# Patient Record
Sex: Female | Born: 1935 | Race: White | Hispanic: No | Marital: Married | Smoking: Never smoker
Health system: Southern US, Community
[De-identification: ages and names within clinical notes are randomized; demographics above are authoritative.]

## PROBLEM LIST (undated history)

## (undated) DIAGNOSIS — E890 Postprocedural hypothyroidism: Secondary | ICD-10-CM

## (undated) DIAGNOSIS — I2510 Atherosclerotic heart disease of native coronary artery without angina pectoris: Secondary | ICD-10-CM

## (undated) DIAGNOSIS — M1990 Unspecified osteoarthritis, unspecified site: Secondary | ICD-10-CM

## (undated) DIAGNOSIS — K648 Other hemorrhoids: Secondary | ICD-10-CM

## (undated) DIAGNOSIS — Z974 Presence of external hearing-aid: Secondary | ICD-10-CM

## (undated) DIAGNOSIS — E785 Hyperlipidemia, unspecified: Secondary | ICD-10-CM

## (undated) DIAGNOSIS — I10 Essential (primary) hypertension: Secondary | ICD-10-CM

## (undated) DIAGNOSIS — K5730 Diverticulosis of large intestine without perforation or abscess without bleeding: Secondary | ICD-10-CM

## (undated) DIAGNOSIS — Z955 Presence of coronary angioplasty implant and graft: Secondary | ICD-10-CM

## (undated) DIAGNOSIS — E119 Type 2 diabetes mellitus without complications: Secondary | ICD-10-CM

## (undated) HISTORY — PX: CORONARY ANGIOPLASTY WITH STENT PLACEMENT: SHX49

## (undated) HISTORY — PX: CARDIOVASCULAR STRESS TEST: SHX262

## (undated) HISTORY — DX: Atherosclerotic heart disease of native coronary artery without angina pectoris: I25.10

## (undated) HISTORY — DX: Unspecified osteoarthritis, unspecified site: M19.90

---

## 1997-10-16 ENCOUNTER — Ambulatory Visit (HOSPITAL_COMMUNITY): Admission: RE | Admit: 1997-10-16 | Discharge: 1997-10-16 | Payer: Self-pay | Admitting: Obstetrics & Gynecology

## 1998-10-22 ENCOUNTER — Ambulatory Visit (HOSPITAL_COMMUNITY): Admission: RE | Admit: 1998-10-22 | Discharge: 1998-10-22 | Payer: Self-pay | Admitting: Obstetrics & Gynecology

## 1998-10-22 ENCOUNTER — Encounter: Payer: Self-pay | Admitting: Obstetrics & Gynecology

## 1999-10-28 ENCOUNTER — Ambulatory Visit (HOSPITAL_COMMUNITY): Admission: RE | Admit: 1999-10-28 | Discharge: 1999-10-28 | Payer: Self-pay | Admitting: Obstetrics & Gynecology

## 1999-10-28 ENCOUNTER — Encounter: Payer: Self-pay | Admitting: Obstetrics & Gynecology

## 1999-12-08 ENCOUNTER — Encounter: Payer: Self-pay | Admitting: Family Medicine

## 1999-12-08 ENCOUNTER — Encounter: Admission: RE | Admit: 1999-12-08 | Discharge: 1999-12-08 | Payer: Self-pay | Admitting: Family Medicine

## 2000-05-20 ENCOUNTER — Other Ambulatory Visit: Admission: RE | Admit: 2000-05-20 | Discharge: 2000-05-20 | Payer: Self-pay | Admitting: Obstetrics & Gynecology

## 2000-11-09 ENCOUNTER — Ambulatory Visit (HOSPITAL_COMMUNITY): Admission: RE | Admit: 2000-11-09 | Discharge: 2000-11-09 | Payer: Self-pay | Admitting: Obstetrics & Gynecology

## 2000-11-09 ENCOUNTER — Encounter: Payer: Self-pay | Admitting: Obstetrics & Gynecology

## 2001-11-15 ENCOUNTER — Ambulatory Visit (HOSPITAL_COMMUNITY): Admission: RE | Admit: 2001-11-15 | Discharge: 2001-11-15 | Payer: Self-pay | Admitting: Obstetrics & Gynecology

## 2001-11-15 ENCOUNTER — Encounter: Payer: Self-pay | Admitting: Obstetrics & Gynecology

## 2002-06-30 ENCOUNTER — Other Ambulatory Visit: Admission: RE | Admit: 2002-06-30 | Discharge: 2002-06-30 | Payer: Self-pay | Admitting: Obstetrics & Gynecology

## 2002-11-21 ENCOUNTER — Ambulatory Visit (HOSPITAL_COMMUNITY): Admission: RE | Admit: 2002-11-21 | Discharge: 2002-11-21 | Payer: Self-pay | Admitting: Obstetrics & Gynecology

## 2002-11-21 ENCOUNTER — Encounter: Payer: Self-pay | Admitting: Obstetrics & Gynecology

## 2003-04-27 ENCOUNTER — Ambulatory Visit (HOSPITAL_COMMUNITY): Admission: RE | Admit: 2003-04-27 | Discharge: 2003-04-27 | Payer: Self-pay | Admitting: Gastroenterology

## 2003-04-27 HISTORY — PX: COLONOSCOPY: SHX174

## 2003-08-09 ENCOUNTER — Other Ambulatory Visit: Admission: RE | Admit: 2003-08-09 | Discharge: 2003-08-09 | Payer: Self-pay | Admitting: Obstetrics & Gynecology

## 2003-11-27 ENCOUNTER — Ambulatory Visit (HOSPITAL_COMMUNITY): Admission: RE | Admit: 2003-11-27 | Discharge: 2003-11-27 | Payer: Self-pay | Admitting: Obstetrics & Gynecology

## 2003-12-03 ENCOUNTER — Encounter: Admission: RE | Admit: 2003-12-03 | Discharge: 2003-12-03 | Payer: Self-pay | Admitting: Obstetrics & Gynecology

## 2003-12-04 ENCOUNTER — Encounter: Admission: RE | Admit: 2003-12-04 | Discharge: 2003-12-04 | Payer: Self-pay | Admitting: Obstetrics & Gynecology

## 2004-12-09 ENCOUNTER — Ambulatory Visit (HOSPITAL_COMMUNITY): Admission: RE | Admit: 2004-12-09 | Discharge: 2004-12-09 | Payer: Self-pay | Admitting: Obstetrics & Gynecology

## 2005-07-27 HISTORY — PX: CATARACT EXTRACTION W/ INTRAOCULAR LENS  IMPLANT, BILATERAL: SHX1307

## 2005-09-09 ENCOUNTER — Other Ambulatory Visit: Admission: RE | Admit: 2005-09-09 | Discharge: 2005-09-09 | Payer: Self-pay | Admitting: Obstetrics & Gynecology

## 2005-11-09 ENCOUNTER — Encounter: Admission: RE | Admit: 2005-11-09 | Discharge: 2005-11-09 | Payer: Self-pay | Admitting: Family Medicine

## 2005-12-10 ENCOUNTER — Ambulatory Visit (HOSPITAL_COMMUNITY): Admission: RE | Admit: 2005-12-10 | Discharge: 2005-12-10 | Payer: Self-pay | Admitting: Obstetrics & Gynecology

## 2006-01-06 ENCOUNTER — Encounter: Admission: RE | Admit: 2006-01-06 | Discharge: 2006-01-06 | Payer: Self-pay | Admitting: Obstetrics & Gynecology

## 2007-01-18 ENCOUNTER — Ambulatory Visit (HOSPITAL_COMMUNITY): Admission: RE | Admit: 2007-01-18 | Discharge: 2007-01-18 | Payer: Self-pay | Admitting: Obstetrics & Gynecology

## 2007-01-18 ENCOUNTER — Encounter: Admission: RE | Admit: 2007-01-18 | Discharge: 2007-01-18 | Payer: Self-pay | Admitting: Family Medicine

## 2007-01-24 ENCOUNTER — Encounter (INDEPENDENT_AMBULATORY_CARE_PROVIDER_SITE_OTHER): Payer: Self-pay | Admitting: Interventional Radiology

## 2007-01-24 ENCOUNTER — Ambulatory Visit (HOSPITAL_COMMUNITY): Admission: RE | Admit: 2007-01-24 | Discharge: 2007-01-24 | Payer: Self-pay | Admitting: Family Medicine

## 2007-05-02 ENCOUNTER — Encounter (INDEPENDENT_AMBULATORY_CARE_PROVIDER_SITE_OTHER): Payer: Self-pay | Admitting: Surgery

## 2007-05-02 ENCOUNTER — Inpatient Hospital Stay (HOSPITAL_COMMUNITY): Admission: RE | Admit: 2007-05-02 | Discharge: 2007-05-03 | Payer: Self-pay | Admitting: Surgery

## 2007-05-02 HISTORY — PX: TOTAL THYROIDECTOMY: SHX2547

## 2008-01-19 ENCOUNTER — Ambulatory Visit (HOSPITAL_COMMUNITY): Admission: RE | Admit: 2008-01-19 | Discharge: 2008-01-19 | Payer: Self-pay | Admitting: Obstetrics & Gynecology

## 2009-01-23 ENCOUNTER — Ambulatory Visit (HOSPITAL_COMMUNITY): Admission: RE | Admit: 2009-01-23 | Discharge: 2009-01-23 | Payer: Self-pay | Admitting: Obstetrics & Gynecology

## 2010-01-29 ENCOUNTER — Ambulatory Visit (HOSPITAL_COMMUNITY): Admission: RE | Admit: 2010-01-29 | Discharge: 2010-01-29 | Payer: Self-pay | Admitting: Obstetrics & Gynecology

## 2010-08-17 ENCOUNTER — Encounter: Payer: Self-pay | Admitting: Family Medicine

## 2010-12-09 NOTE — Op Note (Signed)
NAMEXOE, HOE NO.:  [id number]   MEDICAL RECORD NO.:  [id number]          PATIENT TYPE:  OIB   LOCATION:  0098                         FACILITY:  [facility]   PHYSICIAN:  Velora Heckler, MD      DATE OF BIRTH:  1932-08-02   DATE OF PROCEDURE:  05/02/2007  DATE OF DISCHARGE:                               OPERATIVE REPORT   PREOPERATIVE DIAGNOSIS:  Multinodular thyroid goiter, dominant left  cystic mass with internal calcification.   POSTOPERATIVE DIAGNOSIS:  Multinodular thyroid goiter, dominant left  cystic mass with internal calcification.   PROCEDURE:  Total thyroidectomy.   SURGEON:  Velora Heckler, M.D., FACS   ASSISTANT:  Amber L. Freida Busman, M.D.   ANESTHESIA:  General.   ESTIMATED BLOOD LOSS:  Minimal.   PREPARATION:  Betadine.   COMPLICATIONS:  None.   INDICATIONS:  The patient is 77 year old white female from [place],  [place].  She was found in June of 2008 to have a mass in the  left side of the thyroid.  She noted progressive enlargement as well as  dysphagia with a frequent choking sensation.  The patient was seen and  evaluated by Dr. Dorisann Frames.  Ultrasound demonstrated an enlarged  thyroid gland with multiple nodules in the right lobe and isthmus and a  dominant complex cystic mass in the left thyroid lobe with central  calcification.  Fine-needle aspiration was performed.  The patient now  comes to surgery for thyroidectomy.   DESCRIPTION OF PROCEDURE:  The procedure is done in OR #6 at the [facility].  The patient is brought to the operating room  and placed in the supine position on the operating room table.  Following administration of general anesthesia, the patient is  positioned and then prepped and draped in the usual strict aseptic  fashion.   After ascertaining that an adequate level of anesthesia been obtained, a  Kocher incision is made with a #15 blade.  Dissection is carried through  subcutaneous tissues and platysma.  Hemostasis is obtained with the  electrocautery.  Skin flaps are elevated cephalad and caudad from the  thyroid notch to the sternal notch.  A Mahorner self-retaining retractor  is placed for exposure.  The strap muscles are incised in the midline.  The left thyroid lobe is exposed.  The strap muscles are reflected  laterally.  Using a Pension scheme manager and gentle blunt dissection, the  left lobe was mobilized.  The inferior venous tributaries are ligated  with 2-0 silk ties and divided with the Harmonic scalpel.  The superior  pole vessels are dissected out, ligated in continuity with 2-0 silk ties  and divided with the Harmonic scalpel.  Branches of the inferior thyroid  artery are divided between small clips with the Harmonic scalpel.  The  recurrent nerve is identified and preserved.  Branches of the inferior  thyroid artery are divided with the Harmonic scalpel.  The ligament of  Allyson Sabal is transected with the electrocautery, and the gland is rolled  anteriorly up and onto the anterior trachea.  The  left lobe is markedly  enlarged.  It does contain a large cystic mass which is not opened in  the operative field.  A dry pack is placed in the left neck.  The  isthmus is mobilized across the midline with the electrocautery.  A  moderate size pyramidal lobe is dissected out with the Harmonic scalpel  and included with the specimen.   Next, we turned our attention to the right lobe.  The right lobe was  much smaller but was multinodular.  The strap muscles are reflected  laterally.  The venous tributaries are divided between medium Ligaclips  with the Harmonic scalpel.  The superior pole vessels are divided  between medium Ligaclips with the Harmonic scalpel.  The gland is rolled  anteriorly.  The branches of the inferior thyroid artery are divided  between small Ligaclips with the Harmonic scalpel.  The inferior venous  tributaries are divided between  medium Ligaclips with the Harmonic  scalpel.  The gland is rolled anteriorly, and the ligament of Allyson Sabal is  transected with the Harmonic scalpel.  The recurrent nerve is identified  and preserved.  The parathyroid tissue is identified and preserved.  The  gland is excised off the anterior trachea with the Harmonic scalpel.  Suture is used to mark the left superior pole.  The entire gland is  submitted to pathology for review.  The neck is irrigated with warm  saline and hemostasis obtained with the electrocautery.  Surgicel is  placed in the operative field bilaterally.  The strap muscles are  reapproximated in the midline with interrupted 3-0 Vicryl sutures.  The  platysma is closed with interrupted 3-0 Vicryl sutures.  The skin is  closed with a running 4-0 Monocryl subcuticular suture.  The wound is  washed and dried, and Benzoin and Steri-Strips are applied.  Sterile  dressings are applied.   The patient is awakened from anesthesia and brought to the recovery room  in stable condition.  The patient tolerated the procedure well.      Velora Heckler, MD  Electronically Signed     TMG/MEDQ  D:  05/02/2007  T:  05/02/2007  Job:  [id number]   cc:   Duncan Dull, M.D.  Fax: [phone]   Dorisann Frames, M.D.  Fax: [phone]

## 2010-12-12 NOTE — Discharge Summary (Signed)
NAMEBRANDIN, DILDAY NO.:  [id number]   MEDICAL RECORD NO.:  [id number]          PATIENT TYPE:  INP   LOCATION:  1340                         FACILITY:  [facility]   PHYSICIAN:  Velora Heckler, MD      DATE OF BIRTH:  Aug 30, 1932   DATE OF ADMISSION:  05/02/2007  DATE OF DISCHARGE:  05/03/2007                               DISCHARGE SUMMARY   REASON FOR ADMISSION:  Multinodular thyroid goiter.   HISTORY OF PRESENT ILLNESS:  Patient is a 77 year old white female from  [place], [place].  Patient had developed progressive  enlargement of a multinodular goiter with compressive symptoms.  Patient  was evaluated by Dr. Dorisann Frames.  Ultrasound showed an enlarged  thyroid gland with multiple nodules.  There was a complex cystic mass in  the left thyroid lobe with central calcifications.  Cyst was aspirated  bur recurred within day.  Patient now comes to surgery for resection.   HOSPITAL COURSE:  Patient was admitted on May 02, 2007, and taken  directly to the operating room where she underwent total thyroidectomy.  Postoperatively, the patient did well.  She was observed overnight.  Calcium levels stabilized at 8.6.  She was prepared for discharge home  on the first postoperative day.   DISCHARGE PLAN:  Patient is discharged home on May 03, 2007, in good  condition, tolerating a regular, diet and ambulating independently.   DISCHARGE MEDICATIONS:  1. Vicodin for pain.  2. Calcium carbonate.  3. Synthroid 100 mcg daily.   Patient will be seen back at my office at [facility] in  two to three weeks for wound check.   FINAL DIAGNOSIS:  Multinodular thyroid goiter with compressive symptoms  and calcification of complex cystic mass.   CONDITION ON DISCHARGE:  Good.      Velora Heckler, MD  Electronically Signed     TMG/MEDQ  D:  05/24/2007  T:  05/24/2007  Job:  [id number]   cc:   Dorisann Frames, M.D.  Fax: [phone]   Duncan Dull, M.D.  Fax: [phone]   [facility]

## 2010-12-12 NOTE — Op Note (Signed)
   NAME:  Alicia Medina, Alicia Medina                          ACCOUNT NO.:  [id number]   MEDICAL RECORD NO.:  [id number]                   PATIENT TYPE:  AMB   LOCATION:  ENDO                                 FACILITY:  [facility]   PHYSICIAN:  Petra Kuba, M.D.                 DATE OF BIRTH:  [birth year]   DATE OF PROCEDURE:  04/27/2003  DATE OF DISCHARGE:                                 OPERATIVE REPORT   PROCEDURE:  Colonoscopy.   INDICATIONS FOR PROCEDURE:  Patient with history of colon polyps due for  colonic screening.  Consent was signed after risks, benefits, methods, and  options were thoroughly discussed in the office.   PREMEDICATION:  Demerol 70, Versed 7.   DESCRIPTION OF PROCEDURE:  Rectal inspection was pertinent for external  hemorrhoids, small.  Digital examination was negative.  The video pediatric  adjustable colonoscope was inserted and with some difficulty due to a  tortuous sigmoid, went through that area with rolling her on her back and  abdominal pressure, easily able to advance to the cecum.  Other than some  early left-sided diverticuli, no abnormalities were seen.  The cecum was  identified by the appendiceal orifice and the ileocecal valve.  In fact, the  scope was inserted a short ways into the terminal ileum which was normal.  The scope was slowly withdrawn.  The prep was adequate. There was some  liquid stool that required washing and suctioning and again on slow  withdrawal through the colon, no abnormalities were seen other than the  early left-sided diverticuli.  Anal rectal pull through and retroflexion  confirmed some small hemorrhoids.  The scope was reinserted a short ways up  the left side of the colon, air was suctioned, and the scope removed.  The  patient tolerated the procedure well and there was no obvious immediate  complications.   ENDOSCOPIC ASSESSMENT:  1. Internal and external hemorrhoid.  2. Early left-sided diverticuli.  3. Tortuous sigmoid.  4. Otherwise within normal limits to the terminal ileum.   PLAN:  Happy to see back p.r.n.  Return care to Dr. Kevan Ny for the customary  health care maintenance to include yearly rectals and guaiacs and otherwise  probably repeat screening in five years.                                               Petra Kuba, M.D.    MEM/MEDQ  D:  04/27/2003  T:  04/28/2003  Job:  [id number]

## 2010-12-15 ENCOUNTER — Other Ambulatory Visit: Payer: Self-pay | Admitting: Family Medicine

## 2010-12-15 DIAGNOSIS — N6321 Unspecified lump in the left breast, upper outer quadrant: Secondary | ICD-10-CM

## 2010-12-15 DIAGNOSIS — Z1231 Encounter for screening mammogram for malignant neoplasm of breast: Secondary | ICD-10-CM

## 2010-12-19 ENCOUNTER — Ambulatory Visit
Admission: RE | Admit: 2010-12-19 | Discharge: 2010-12-19 | Disposition: A | Discharge status: Home / Self Care | Payer: Medicare Other | Source: Ambulatory Visit | Attending: Family Medicine | Admitting: Family Medicine

## 2010-12-19 DIAGNOSIS — N6321 Unspecified lump in the left breast, upper outer quadrant: Secondary | ICD-10-CM

## 2011-02-05 ENCOUNTER — Ambulatory Visit
Admission: RE | Admit: 2011-02-05 | Discharge: 2011-02-05 | Disposition: A | Discharge status: Home / Self Care | Payer: Medicare Other | Source: Ambulatory Visit | Attending: Family Medicine | Admitting: Family Medicine

## 2011-02-05 DIAGNOSIS — Z1231 Encounter for screening mammogram for malignant neoplasm of breast: Secondary | ICD-10-CM

## 2011-05-07 LAB — URINALYSIS, ROUTINE W REFLEX MICROSCOPIC
Hgb urine dipstick: NEGATIVE
Ketones, ur: NEGATIVE
Nitrite: NEGATIVE
Urobilinogen, UA: 0.2

## 2011-05-07 LAB — DIFFERENTIAL
Basophils Absolute: 0
Eosinophils Absolute: 0.2
Eosinophils Relative: 3
Lymphocytes Relative: 20
Monocytes Absolute: 0.7

## 2011-05-07 LAB — CBC
MCHC: 34
MCV: 85.2

## 2011-05-07 LAB — BASIC METABOLIC PANEL
BUN: 7
CO2: 30
Chloride: 105
GFR calc Af Amer: >60
Sodium: 143

## 2011-05-07 LAB — PROTIME-INR: INR: 1

## 2011-05-07 LAB — CALCIUM: Calcium: 8.6

## 2011-08-28 ENCOUNTER — Other Ambulatory Visit: Payer: Self-pay

## 2011-08-28 ENCOUNTER — Encounter (HOSPITAL_COMMUNITY): Payer: Self-pay | Admitting: Internal Medicine

## 2011-08-28 ENCOUNTER — Encounter (HOSPITAL_COMMUNITY)
Admission: EM | Disposition: A | Discharge status: Home / Self Care | Payer: Self-pay | Source: Ambulatory Visit | Attending: Cardiology

## 2011-08-28 ENCOUNTER — Inpatient Hospital Stay (HOSPITAL_COMMUNITY): Payer: Medicare Other

## 2011-08-28 ENCOUNTER — Inpatient Hospital Stay (HOSPITAL_COMMUNITY)
Admission: EM | Admit: 2011-08-28 | Discharge: 2011-09-05 | DRG: 237 | Disposition: A | Discharge status: Home / Self Care | Payer: Medicare Other | Source: Ambulatory Visit | Attending: Cardiology | Admitting: Cardiology

## 2011-08-28 ENCOUNTER — Ambulatory Visit (HOSPITAL_COMMUNITY): Payer: Medicare Other

## 2011-08-28 ENCOUNTER — Encounter (HOSPITAL_COMMUNITY): Payer: Self-pay

## 2011-08-28 DIAGNOSIS — I10 Essential (primary) hypertension: Secondary | ICD-10-CM | POA: Diagnosis present

## 2011-08-28 DIAGNOSIS — Y840 Cardiac catheterization as the cause of abnormal reaction of the patient, or of later complication, without mention of misadventure at the time of the procedure: Secondary | ICD-10-CM | POA: Diagnosis present

## 2011-08-28 DIAGNOSIS — I469 Cardiac arrest, cause unspecified: Secondary | ICD-10-CM

## 2011-08-28 DIAGNOSIS — I472 Ventricular tachycardia, unspecified: Secondary | ICD-10-CM | POA: Diagnosis present

## 2011-08-28 DIAGNOSIS — G9349 Other encephalopathy: Secondary | ICD-10-CM | POA: Diagnosis not present

## 2011-08-28 DIAGNOSIS — I4901 Ventricular fibrillation: Secondary | ICD-10-CM | POA: Diagnosis present

## 2011-08-28 DIAGNOSIS — D72829 Elevated white blood cell count, unspecified: Secondary | ICD-10-CM | POA: Diagnosis not present

## 2011-08-28 DIAGNOSIS — I2119 ST elevation (STEMI) myocardial infarction involving other coronary artery of inferior wall: Principal | ICD-10-CM | POA: Diagnosis present

## 2011-08-28 DIAGNOSIS — I2582 Chronic total occlusion of coronary artery: Secondary | ICD-10-CM | POA: Diagnosis present

## 2011-08-28 DIAGNOSIS — E785 Hyperlipidemia, unspecified: Secondary | ICD-10-CM | POA: Diagnosis present

## 2011-08-28 DIAGNOSIS — J9600 Acute respiratory failure, unspecified whether with hypoxia or hypercapnia: Secondary | ICD-10-CM | POA: Diagnosis present

## 2011-08-28 DIAGNOSIS — E876 Hypokalemia: Secondary | ICD-10-CM | POA: Diagnosis not present

## 2011-08-28 DIAGNOSIS — I5030 Unspecified diastolic (congestive) heart failure: Secondary | ICD-10-CM | POA: Diagnosis present

## 2011-08-28 DIAGNOSIS — Z881 Allergy status to other antibiotic agents status: Secondary | ICD-10-CM

## 2011-08-28 DIAGNOSIS — Y9210 Unspecified residential institution as the place of occurrence of the external cause: Secondary | ICD-10-CM | POA: Diagnosis present

## 2011-08-28 DIAGNOSIS — R4182 Altered mental status, unspecified: Secondary | ICD-10-CM

## 2011-08-28 DIAGNOSIS — I442 Atrioventricular block, complete: Secondary | ICD-10-CM | POA: Diagnosis present

## 2011-08-28 DIAGNOSIS — IMO0002 Reserved for concepts with insufficient information to code with codable children: Secondary | ICD-10-CM | POA: Diagnosis not present

## 2011-08-28 DIAGNOSIS — J189 Pneumonia, unspecified organism: Secondary | ICD-10-CM | POA: Diagnosis not present

## 2011-08-28 DIAGNOSIS — R570 Cardiogenic shock: Secondary | ICD-10-CM

## 2011-08-28 DIAGNOSIS — I2510 Atherosclerotic heart disease of native coronary artery without angina pectoris: Secondary | ICD-10-CM

## 2011-08-28 DIAGNOSIS — I4729 Other ventricular tachycardia: Secondary | ICD-10-CM | POA: Diagnosis present

## 2011-08-28 DIAGNOSIS — Z79899 Other long term (current) drug therapy: Secondary | ICD-10-CM

## 2011-08-28 DIAGNOSIS — E669 Obesity, unspecified: Secondary | ICD-10-CM | POA: Diagnosis present

## 2011-08-28 DIAGNOSIS — E890 Postprocedural hypothyroidism: Secondary | ICD-10-CM | POA: Diagnosis present

## 2011-08-28 DIAGNOSIS — T82897A Other specified complication of cardiac prosthetic devices, implants and grafts, initial encounter: Secondary | ICD-10-CM | POA: Diagnosis not present

## 2011-08-28 DIAGNOSIS — I252 Old myocardial infarction: Secondary | ICD-10-CM

## 2011-08-28 DIAGNOSIS — Z7982 Long term (current) use of aspirin: Secondary | ICD-10-CM

## 2011-08-28 DIAGNOSIS — I509 Heart failure, unspecified: Secondary | ICD-10-CM | POA: Diagnosis present

## 2011-08-28 DIAGNOSIS — E119 Type 2 diabetes mellitus without complications: Secondary | ICD-10-CM

## 2011-08-28 DIAGNOSIS — D62 Acute posthemorrhagic anemia: Secondary | ICD-10-CM | POA: Diagnosis not present

## 2011-08-28 HISTORY — DX: Postprocedural hypothyroidism: E89.0

## 2011-08-28 HISTORY — PX: CORONARY ANGIOGRAM: SHX5466

## 2011-08-28 HISTORY — PX: PERCUTANEOUS CORONARY STENT INTERVENTION (PCI-S): SHX5485

## 2011-08-28 HISTORY — PX: LEFT HEART CATHETERIZATION WITH CORONARY ANGIOGRAM: SHX5451

## 2011-08-28 HISTORY — DX: Essential (primary) hypertension: I10

## 2011-08-28 HISTORY — DX: Hyperlipidemia, unspecified: E78.5

## 2011-08-28 LAB — CBC
HCT: 33.2 % — ABNORMAL LOW (ref 36.0–46.0)
Hemoglobin: 11.1 g/dL — ABNORMAL LOW (ref 12.0–15.0)
MCHC: 33.4 g/dL (ref 30.0–36.0)
MCV: 83.4 fL (ref 78.0–100.0)
RDW: 14.9 % (ref 11.5–15.5)
WBC: 16 10*3/uL — ABNORMAL HIGH (ref 4.0–10.5)

## 2011-08-28 LAB — POCT I-STAT, CHEM 8
BUN: 13 mg/dL (ref 6–23)
Calcium, Ion: 1.08 mmol/L — ABNORMAL LOW (ref 1.12–1.32)
Chloride: 108 mEq/L (ref 96–112)
Creatinine, Ser: 0.8 mg/dL (ref 0.50–1.10)
Glucose, Bld: 287 mg/dL — ABNORMAL HIGH (ref 70–99)
HCT: 35 % — ABNORMAL LOW (ref 36.0–46.0)
Hemoglobin: 11.9 g/dL — ABNORMAL LOW (ref 12.0–15.0)
Potassium: 3 mEq/L — ABNORMAL LOW (ref 3.5–5.1)
Sodium: 142 mEq/L (ref 135–145)
TCO2: 20 mmol/L (ref 0–100)

## 2011-08-28 LAB — CARDIAC PANEL(CRET KIN+CKTOT+MB+TROPI)
CK, MB: 3.6 ng/mL (ref 0.3–4.0)
Total CK: 68 U/L (ref 7–177)

## 2011-08-28 LAB — COMPREHENSIVE METABOLIC PANEL
Alkaline Phosphatase: 38 U/L — ABNORMAL LOW (ref 39–117)
BUN: 15 mg/dL (ref 6–23)
Chloride: 106 mEq/L (ref 96–112)
Creatinine, Ser: 0.63 mg/dL (ref 0.50–1.10)
GFR calc Af Amer: >90 mL/min (ref >90)
GFR calc non Af Amer: 85 mL/min — ABNORMAL LOW (ref >90)
Glucose, Bld: 294 mg/dL — ABNORMAL HIGH (ref 70–99)
Potassium: 2.7 mEq/L — CL (ref 3.5–5.1)
Total Bilirubin: 0.2 mg/dL — ABNORMAL LOW (ref 0.3–1.2)

## 2011-08-28 LAB — POCT I-STAT 3, ART BLOOD GAS (G3+)
Acid-base deficit: 7 mmol/L — ABNORMAL HIGH (ref 0.0–2.0)
Acid-base deficit: 10 mmol/L — ABNORMAL HIGH (ref 0.0–2.0)
Bicarbonate: 17.1 mEq/L — ABNORMAL LOW (ref 20.0–24.0)
TCO2: 21 mmol/L (ref 0–100)
pCO2 arterial: 42.9 mmHg (ref 35.0–45.0)
pH, Arterial: 7.257 — ABNORMAL LOW (ref 7.350–7.400)
pO2, Arterial: 194 mmHg — ABNORMAL HIGH (ref 80.0–100.0)

## 2011-08-28 LAB — APTT: aPTT: 123 seconds — ABNORMAL HIGH (ref 24–37)

## 2011-08-28 LAB — LIPID PANEL: LDL Cholesterol: 86 mg/dL (ref 0–99)

## 2011-08-28 LAB — PROTIME-INR
INR: 7.61 (ref 0.00–1.49)
Prothrombin Time: 65.4 seconds — ABNORMAL HIGH (ref 11.6–15.2)

## 2011-08-28 LAB — GLUCOSE, CAPILLARY: Glucose-Capillary: 377 mg/dL — ABNORMAL HIGH (ref 70–99)

## 2011-08-28 LAB — POCT ACTIVATED CLOTTING TIME: Activated Clotting Time: 628 seconds

## 2011-08-28 SURGERY — LEFT HEART CATHETERIZATION WITH CORONARY ANGIOGRAM
Anesthesia: LOCAL

## 2011-08-28 SURGERY — PERCUTANEOUS CORONARY STENT INTERVENTION (PCI-S)
Laterality: Left

## 2011-08-28 MED ORDER — SODIUM CHLORIDE 0.9 % IV SOLN: 1.0000 mL/kg/h | INTRAVENOUS | Status: AC

## 2011-08-28 MED ORDER — BIVALIRUDIN 250 MG IV SOLR
INTRAVENOUS | Status: AC
  Filled 2011-08-28: qty 250

## 2011-08-28 MED ORDER — ETOMIDATE 2 MG/ML IV SOLN
INTRAVENOUS | Status: DC | PRN
  Administered 2011-08-28: 14 mg via INTRAVENOUS

## 2011-08-28 MED ORDER — CLOPIDOGREL BISULFATE 300 MG PO TABS
ORAL_TABLET | ORAL | Status: AC
  Administered 2011-08-29: 75 mg via ORAL
  Filled 2011-08-28: qty 2

## 2011-08-28 MED ORDER — INSULIN ASPART 100 UNIT/ML ~~LOC~~ SOLN: 0.0000 [IU] | SUBCUTANEOUS | Status: DC

## 2011-08-28 MED ORDER — DEXTROSE 10 % IV SOLN: INTRAVENOUS | Status: DC

## 2011-08-28 MED ORDER — NOREPINEPHRINE BITARTRATE 1 MG/ML IJ SOLN
2.0000 ug/min | INTRAVENOUS | Status: DC
  Administered 2011-08-28: 40 ug/min via INTRAVENOUS
  Administered 2011-08-28: 35 ug/min via INTRAVENOUS
  Filled 2011-08-28 (×4): qty 4

## 2011-08-28 MED ORDER — EPTIFIBATIDE 75 MG/100ML IV SOLN
2.0000 ug/kg/min | INTRAVENOUS | Status: DC
  Administered 2011-08-28: 2.006 ug/kg/min via INTRAVENOUS
  Administered 2011-08-29 – 2011-08-30 (×5): 2 ug/kg/min via INTRAVENOUS
  Filled 2011-08-28 (×12): qty 100

## 2011-08-28 MED ORDER — ONDANSETRON HCL 4 MG/2ML IJ SOLN: 4.0000 mg | Freq: Four times a day (QID) | INTRAMUSCULAR | Status: DC | PRN

## 2011-08-28 MED ORDER — LIDOCAINE HCL (PF) 1 % IJ SOLN
INTRAMUSCULAR | Status: AC
  Filled 2011-08-28: qty 30

## 2011-08-28 MED ORDER — MIDAZOLAM HCL 2 MG/2ML IJ SOLN
INTRAMUSCULAR | Status: AC
  Filled 2011-08-28: qty 2

## 2011-08-28 MED ORDER — POTASSIUM CHLORIDE 10 MEQ/50ML IV SOLN
INTRAVENOUS | Status: AC
  Administered 2011-08-28: 10 meq
  Filled 2011-08-28: qty 100

## 2011-08-28 MED ORDER — SODIUM CHLORIDE 0.9 % IV SOLN
INTRAVENOUS | Status: DC
  Administered 2011-08-28: 10 mL/h via INTRAVENOUS

## 2011-08-28 MED ORDER — BIVALIRUDIN 250 MG IV SOLR
INTRAVENOUS | Status: AC
  Filled 2011-08-28: qty 500

## 2011-08-28 MED ORDER — HYDROCORTISONE SOD SUCCINATE 100 MG IJ SOLR
50.0000 mg | Freq: Four times a day (QID) | INTRAMUSCULAR | Status: DC
  Administered 2011-08-28 – 2011-09-02 (×19): 50 mg via INTRAVENOUS
  Filled 2011-08-28 (×23): qty 1

## 2011-08-28 MED ORDER — SODIUM CHLORIDE 0.9 % IV SOLN
INTRAVENOUS | Status: DC
  Administered 2011-08-28: 19:00:00 via INTRAVENOUS
  Administered 2011-08-29: 75 mL/h via INTRAVENOUS
  Administered 2011-08-30 (×2): via INTRAVENOUS

## 2011-08-28 MED ORDER — ASPIRIN 325 MG PO TABS
325.0000 mg | ORAL_TABLET | Freq: Every day | ORAL | Status: DC
  Administered 2011-08-29 – 2011-08-31 (×4): 325 mg via ORAL
  Filled 2011-08-28 (×4): qty 1

## 2011-08-28 MED ORDER — FENTANYL BOLUS VIA INFUSION
50.0000 ug | Freq: Four times a day (QID) | INTRAVENOUS | Status: DC | PRN
  Administered 2011-08-29 – 2011-08-30 (×3): 50 ug via INTRAVENOUS
  Filled 2011-08-28: qty 100

## 2011-08-28 MED ORDER — DOPAMINE-DEXTROSE 3.2-5 MG/ML-% IV SOLN
INTRAVENOUS | Status: AC
  Filled 2011-08-28: qty 250

## 2011-08-28 MED ORDER — ATROPINE SULFATE 1 MG/ML IJ SOLN
INTRAMUSCULAR | Status: AC
  Filled 2011-08-28: qty 1

## 2011-08-28 MED ORDER — AMIODARONE HCL IN DEXTROSE 360-4.14 MG/200ML-% IV SOLN
INTRAVENOUS | Status: AC
  Administered 2011-08-28: 59.94 mg/h via INTRAVENOUS
  Filled 2011-08-28: qty 200

## 2011-08-28 MED ORDER — POTASSIUM CHLORIDE 10 MEQ/50ML IV SOLN
INTRAVENOUS | Status: AC
  Administered 2011-08-28: 19:00:00
  Filled 2011-08-28: qty 50

## 2011-08-28 MED ORDER — HEPARIN SOD (PORCINE) IN D5W 100 UNIT/ML IV SOLN
950.0000 [IU]/h | INTRAVENOUS | Status: DC
  Administered 2011-08-29: 950 [IU]/h via INTRAVENOUS
  Filled 2011-08-28 (×2): qty 250

## 2011-08-28 MED ORDER — CLOPIDOGREL 45 MG/30 ML ORAL SUSPENSION
300.0000 mg | Freq: Once | ORAL | Status: DC
  Administered 2011-08-28: 300 mg
  Filled 2011-08-28: qty 210

## 2011-08-28 MED ORDER — POTASSIUM CHLORIDE 10 MEQ/100ML IV SOLN
INTRAVENOUS | Status: AC
  Filled 2011-08-28: qty 100

## 2011-08-28 MED ORDER — POTASSIUM CHLORIDE 10 MEQ/100ML IV SOLN
10.0000 meq | INTRAVENOUS | Status: AC
  Administered 2011-08-28 (×5): 10 meq via INTRAVENOUS
  Filled 2011-08-28: qty 100

## 2011-08-28 MED ORDER — NITROGLYCERIN 0.2 MG/ML ON CALL CATH LAB
INTRAVENOUS | Status: AC
  Filled 2011-08-28: qty 1

## 2011-08-28 MED ORDER — AMIODARONE HCL IN DEXTROSE 360-4.14 MG/200ML-% IV SOLN
30.0000 mg/h | INTRAVENOUS | Status: DC
  Administered 2011-08-28: 59.94 mg/h via INTRAVENOUS
  Administered 2011-08-29 – 2011-08-30 (×4): 30 mg/h via INTRAVENOUS
  Filled 2011-08-28 (×10): qty 200

## 2011-08-28 MED ORDER — BIOTENE DRY MOUTH MT LIQD
15.0000 mL | Freq: Four times a day (QID) | OROMUCOSAL | Status: DC
  Administered 2011-08-29 – 2011-09-01 (×13): 15 mL via OROMUCOSAL

## 2011-08-28 MED ORDER — SODIUM CHLORIDE 0.9 % IV SOLN
2.0000 mg/h | INTRAVENOUS | Status: DC
  Administered 2011-08-28 – 2011-08-30 (×3): 4 mg/h via INTRAVENOUS
  Administered 2011-08-30: 2 mg/h via INTRAVENOUS
  Filled 2011-08-28 (×4): qty 10

## 2011-08-28 MED ORDER — PANTOPRAZOLE SODIUM 40 MG IV SOLR
40.0000 mg | Freq: Two times a day (BID) | INTRAVENOUS | Status: DC
  Administered 2011-08-28 – 2011-09-01 (×8): 40 mg via INTRAVENOUS
  Filled 2011-08-28 (×9): qty 40

## 2011-08-28 MED ORDER — SODIUM CHLORIDE 0.9 % IV SOLN
10.0000 ug/h | INTRAVENOUS | Status: DC
  Filled 2011-08-28: qty 50

## 2011-08-28 MED ORDER — SODIUM CHLORIDE 0.9 % IV SOLN
50.0000 ug/h | INTRAVENOUS | Status: DC
  Administered 2011-08-28: 100 ug/h via INTRAVENOUS
  Administered 2011-08-29: 125 ug/h via INTRAVENOUS
  Administered 2011-08-30: 75 ug/h via INTRAVENOUS
  Filled 2011-08-28 (×4): qty 50

## 2011-08-28 MED ORDER — CHLORHEXIDINE GLUCONATE 0.12 % MT SOLN
15.0000 mL | Freq: Two times a day (BID) | OROMUCOSAL | Status: DC
  Administered 2011-08-28 – 2011-08-31 (×7): 15 mL via OROMUCOSAL
  Filled 2011-08-28 (×7): qty 15

## 2011-08-28 MED ORDER — LIDOCAINE-EPINEPHRINE 1 %-1:100000 IJ SOLN
INTRAMUSCULAR | Status: AC
  Filled 2011-08-28: qty 1

## 2011-08-28 MED ORDER — CLOPIDOGREL BISULFATE 300 MG PO TABS
300.0000 mg | ORAL_TABLET | Freq: Once | ORAL | Status: AC
  Administered 2011-08-28: 300 mg
  Filled 2011-08-28: qty 1

## 2011-08-28 MED ORDER — HYDROCORTISONE SOD SUCCINATE 100 MG IJ SOLR
50.0000 mg | Freq: Four times a day (QID) | INTRAMUSCULAR | Status: DC
  Filled 2011-08-28 (×3): qty 1

## 2011-08-28 MED ORDER — HYDROCORTISONE SOD SUCCINATE 100 MG IJ SOLR
100.0000 mg | Freq: Every day | INTRAMUSCULAR | Status: AC
  Filled 2011-08-28: qty 2

## 2011-08-28 MED ORDER — NOREPINEPHRINE BITARTRATE 1 MG/ML IJ SOLN
2.0000 ug/min | INTRAMUSCULAR | Status: DC
  Administered 2011-08-29: 10 ug/min via INTRAVENOUS
  Administered 2011-08-30: 5 ug/min via INTRAVENOUS
  Filled 2011-08-28 (×2): qty 16

## 2011-08-28 MED ORDER — EPTIFIBATIDE 75 MG/100ML IV SOLN
INTRAVENOUS | Status: AC
  Administered 2011-08-28: 2.006 ug/kg/min via INTRAVENOUS
  Filled 2011-08-28: qty 200

## 2011-08-28 MED ORDER — HEPARIN (PORCINE) IN NACL 2-0.9 UNIT/ML-% IJ SOLN
INTRAMUSCULAR | Status: AC
  Filled 2011-08-28: qty 2000

## 2011-08-28 MED ORDER — SODIUM CHLORIDE 0.9 % IV SOLN
INTRAVENOUS | Status: DC
  Administered 2011-08-28: 7 [IU]/h via INTRAVENOUS
  Filled 2011-08-28 (×3): qty 1

## 2011-08-28 MED ORDER — SODIUM CHLORIDE 0.9 % IV SOLN: INTRAVENOUS | Status: DC

## 2011-08-28 MED ORDER — SUCCINYLCHOLINE CHLORIDE 20 MG/ML IJ SOLN
INTRAMUSCULAR | Status: DC | PRN
  Administered 2011-08-28: 140 mg via INTRAVENOUS

## 2011-08-28 MED ORDER — ROCURONIUM BROMIDE 100 MG/10ML IV SOLN
INTRAVENOUS | Status: DC | PRN
  Administered 2011-08-28: 50 mg via INTRAVENOUS

## 2011-08-28 MED ORDER — MIDAZOLAM BOLUS VIA INFUSION
1.0000 mg | INTRAVENOUS | Status: DC | PRN
  Administered 2011-08-29 (×2): 2 mg via INTRAVENOUS
  Filled 2011-08-28: qty 2

## 2011-08-28 MED ORDER — INSULIN ASPART 100 UNIT/ML ~~LOC~~ SOLN
0.0000 [IU] | SUBCUTANEOUS | Status: DC
  Filled 2011-08-28: qty 3

## 2011-08-28 MED ORDER — SODIUM CHLORIDE 0.9 % IV SOLN
1.0000 mg/h | INTRAVENOUS | Status: DC
  Filled 2011-08-28: qty 10

## 2011-08-28 NOTE — Anesthesia Postprocedure Evaluation (Signed)
  Anesthesia Post-op Note  Patient: Alicia Medina  Procedure(s) Performed:  LEFT HEART CATHETERIZATION WITH CORONARY ANGIOGRAM  Patient Location: PACU and Cath Lab  Anesthesia Type: General  Level of Consciousness: Patient remains intubated per anesthesia plan  Airway and Oxygen Therapy: Patient placed on Ventilator (see vital sign flow sheet for setting)  Post-op Pain: none  Post-op Assessment: Post-op Vital signs reviewed and PATIENT'S CARDIOVASCULAR STATUS UNSTABLE  Post-op Vital Signs: unstable  Complications: No apparent anesthesia complications

## 2011-08-28 NOTE — H&P (Signed)
CARDIOLOGY ADMISSION NOTE  Patient ID: Alicia Medina MRN: [id number] DOB/AGE: 76-Sep-1934 76 y.o.  Admit date: 08/28/2011 Primary Physician   Dr. Wilford Sports, [place] Primary Cardiologist   None Chief Complaint    Nausea, Vomiting  HPI: Patient is a 78 year old woman with past history of DM 2, hyperlipidemia, hypertension, hypothyroidism who was feeling well until 10 AM today- when her husband left home. He came back around 11 50 and and found patient having nausea vomiting. She had few more episodes of severe nausea and vomiting and was found down between bed and wall, was weak and unconscious. Husband called EMS and patient was found to have STEMI and was transferred to [facility] cath lab. After coming to patient was having repetitive vomiting episodes and so was intubated. Also she had multiple V. fib episodes for which she was defibrillated. See cath report for further detail of procedure.  Patient did not complain of any sick feeling including fever, chills, nausea vomiting, chest pain, short of breath prior to current episode. She doesn't have any history of CAD.   Home Meds: Metformin Glimepiride Levothyroxine Losartan Pravastatin   Past medical history:  DM 2  Hyperlipidemia Hypertension Hypothyroidism- secondary to thyroidectomy.  Past surgical history:  Total thyroidectomy in October 2008. Colonoscopy in October 2004- internal/external hemorrhoids  Allergies:  Tetracycline antibiotic   History   Social History  . Marital Status: Married    Spouse Name: N/A    Number of Children: N/A  . Years of Education: N/A   Occupational History  . Not on file.   Social History Main Topics  . Smoking status: Not on file  . Smokeless tobacco: Not on file  . Alcohol Use: Not on file  . Drug Use: Not on file  . Sexually Active: Not on file   Other Topics Concern  . Not on file   Social History Narrative   Lives with husband.Has 2 sons- both healthy.Younger  son lives in [place], [place] in order son lives in [place].    Family history:  No family history of cardiac disease or diabetes except diabetes in nephew.     Physical Exam: Blood pressure 132/68, resp. rate 25, height 5\' 7"  (1.702 m), weight 189 lb 9.5 oz (86 kg), SpO2 100.00%.  General: Pt in acute distress and vomiting intermittently HEENT: PERRL, EOMI, no scleral icterus Cardiac: S1/S2, RRR, no rubs, murmurs or gallops Pulm: clear to auscultation bilaterally, moving normal volumes of air Abd: soft, nontender, nondistended, BS present Ext: warm and well perfused, no pedal edema Neuro: alert and oriented X3 when brought to Cath lab.   Labs: Lab Results  Component Value Date   BUN 15 08/28/2011   Lab Results  Component Value Date   CREATININE 0.63 08/28/2011   Lab Results  Component Value Date   NA 139 08/28/2011   K 2.7* 08/28/2011   CL 106 08/28/2011   CO2 18* 08/28/2011   Lab Results  Component Value Date   CKTOTAL 68 08/28/2011   CKMB 3.6 08/28/2011   TROPONINI <0.30 08/28/2011   Lab Results  Component Value Date   WBC 16.0* 08/28/2011   HGB 11.1* 08/28/2011   HCT 33.2* 08/28/2011   MCV 83.4 08/28/2011   PLT 244 08/28/2011   Lab Results  Component Value Date   CHOL 150 08/28/2011   HDL 38* 08/28/2011   LDLCALC 86 08/28/2011   TRIG 131 08/28/2011   CHOLHDL 3.9 08/28/2011   Lab Results  Component Value Date  ALT 27 08/28/2011   AST 26 08/28/2011   ALKPHOS 38* 08/28/2011   BILITOT 0.2* 08/28/2011      Radiology:  pend  EKG:  Inferolateral STEMI  ASSESSMENT AND PLAN:   1) STEMI: Patient was taken to Cath Lab and got RCA bare-metal stenting. She also had temporary pacemaker due to Sinus bradycardia in the 30s. Intra-aortic balloon pump was placed after starting this patient was in cardiogenic shock.  - Transferred to ICU 2900 - Continue Angiomax. - Start Plavix with loading dose. - Reload with aspirin as patient was vomiting and did not retain the dose given by EMS. - 2-D echo to  check ventricular function. - Continue statin.  2) Cardiogenic shock: IABP. - Pressor support with dopamine. - Add other pressors as needed.  3) DM 2: HbA1c. - Sensitive sliding scale.  4) Hypothyroidism: Check TSH - Hold Synthroid for now.    Signed: PATEL,RAVI 08/28/2011, 4:41 PM  Patient seen and examined and history reviewed. Agree with above findings and plan. Patient presented with Nausea and vomiting without chest pain. Marked inferolateral ST elevation noted on ECG. Brought emergently to cath lab for emergent PCI. Developed hemodynamic instability on arrival with multiple episodes of Vfib, complete heart block and cardiogenic shock. Please refer to Cardiac cath note for details.  Thedora Hinders, [facility] 08/28/2011 5:47 PM

## 2011-08-28 NOTE — Op Note (Signed)
CARDIAC CATH NOTE  Name: Alicia Medina MRN: [id number] DOB: [birth year]  Procedure: PTCA and stenting of the right coronary artery  Indication: 78 year old white female who presented earlier in the day with an acute ST elevation inferior lateral myocardial infarction. Is associated with cardiogenic shock. She had multiple episodes of ventricular fibrillation and complete heart block. She had stenting of the mid right coronary. She was transferred to the intensive care unit with temporary pacemaker and intra-aortic balloon pump in place. She has had multiple episodes of ventricular fibrillation since then with hemodynamic instability. ECG shows persistent marked ST elevation inferiorly.  Procedural Details: The left groin was prepped, draped, and anesthetized with 1% lidocaine. Using the modified Seldinger technique, a 6 Fr sheath was introduced into the right femoral artery.  IV bivalirudin was continued with an ACT of 620 seconds. A 6 [place] FR4 guide catheter was inserted. Angiography demonstrated occlusion of the previously placed stent in the mid right coronary. A pro-water coronary guidewire was used to cross the lesion. We initially performed thrombus extraction within [facility] catheter. This yielded copious platelet rich thrombus. The patient was started on IV Integrilin with a double bolus. The lesion was then dilated with a 3.5 mm balloon. There was persistent narrowing and haziness within the stent. Intravascular ultrasound was then performed which demonstrated that the stent was well opposed to the arterial wall and appropriately size. There is no evidence of edge dissection or tissue extravasation. This appeared to be probably a thrombus event. The lesion was then stented with a 3.5 x 22 mm integrity stent.  The stent was postdilated with a 3.5 mm noncompliant balloon.  Following PCI, there was 0% residual stenosis and TIMI-3 flow. Final angiography confirmed an excellent result. Throughout the  procedure there was persistent oozing around the left femoral sheath. Patient developed a small hematoma. Angiography through the sheath demonstrated good position of the sheath within the femoral artery. We upsized to a 7 [place] sheath. Despite this there was continued oozing.  Lesion Data: Vessel: Mid RCA Percent stenosis (pre): 100% TIMI-flow (pre):  0 Stent:  3.5 x 22 mm integrity Percent stenosis (post): 0% TIMI-flow (post): 3  Conclusions: Abrupt thrombotic occlusion of the previously placed stent in the right coronary. This was successfully reperfused with extraction thrombectomy and additional stenting with a bare-metal stent. We will continue patient on aggressive antiplatelet therapy including aspirin, Plavix, and Integrilin. Due to continue hematoma formation at the site of the sheath will be removed. A FemoStop will be placed. We will stop the IV Angiomax and resume heparin IV 4 hours after sheath removal.  Recommendations: As noted above.  [name] MD,FACC 08/28/2011, 5:18 PM

## 2011-08-28 NOTE — Transfer of Care (Signed)
Immediate Anesthesia Transfer of Care Note  Patient: Alicia Medina  Procedure(s) Performed:  LEFT HEART CATHETERIZATION WITH CORONARY ANGIOGRAM  Patient Location: Cath Lab  Anesthesia Type: General  Level of Consciousness: sedated  Airway & Oxygen Therapy: Patient placed on Ventilator (see vital sign flow sheet for setting)    Post vital signs: unstable  Complications: No apparent anesthesia complications

## 2011-08-28 NOTE — Progress Notes (Signed)
Chaplain responded to a STEMI page. Chaplain introduced self to patient's husband, Alicia Medina, and offered emotional support. Alicia Medina stated that he was coping okay. Follow up as needed.

## 2011-08-28 NOTE — Anesthesia Preprocedure Evaluation (Signed)
Anesthesia Evaluation Anesthesia Physical Anesthesia Plan  ASA: IV and Emergent  Anesthesia Plan:    Post-op Pain Management:    Induction:   Airway Management Planned:   Additional Equipment:   Intra-op Plan:   Post-operative Plan:   Informed Consent:   Plan Discussed with:   Anesthesia Plan Comments:         Anesthesia Quick Evaluation  

## 2011-08-28 NOTE — Progress Notes (Addendum)
ANTICOAGULATION CONSULT NOTE - Initial Consult  Pharmacy Consult for Heparin, Integrelin Indication: chest pain/ACS  No Known Allergies  Patient Measurements: Height: 5\' 7"  (170.2 cm) Weight: 189 lb 9.5 oz (86 kg) IBW/kg (Calculated) : 61.6  Heparin Dosing Weight: 80 kg  Vital Signs: BP: 132/68 mmHg (02/01 1500) Pulse Rate: 75  (02/01 1549)  Labs:  Basename 08/28/11 1415 08/28/11 1315  HGB 11.1* --  HCT 33.2* --  PLT 244 --  APTT 123* --  LABPROT 65.4* --  INR 7.61* --  HEPARINUNFRC -- --  CREATININE 0.63 --  CKTOTAL -- 68  CKMB -- 3.6  TROPONINI -- <0.30   Estimated Creatinine Clearance: 67.4 ml/min (by C-G formula based on Cr of 0.63).  Medical History: Past Medical History  Diagnosis Date  . Hypothyroidism, postsurgical   . Hypertension   . Hyperlipidemia   . Diabetes mellitus     Assessment: 78 year old woman admitted with STEMI and V-Fib started is s/p cardiac cath to continue on Integrelin post cath.  Heparin to start 4 hours after the sheath is pulled if no bleeding at the groin site. Goal of Therapy:  Heparin level 0.3-0.5 while on Integrelin   Plan:  Start heparin drip at 2130 at 950 units/hr if no bleeding.  Heparin level 6 hours later.  Daily heparin level, CBC while on heparin.  Mickeal Skinner 08/28/2011,5:48 PM  Addendum:  Delay heparin start to 2359 per MD.  Will check heparin level 6 hours later and adjust per goal. Celedonio Miyamoto, PharmD

## 2011-08-28 NOTE — Op Note (Signed)
Cardiac Catheterization Procedure Note  Name: Alicia Medina MRN: [id number] DOB: 1932-09-02  Procedure: Left Heart Cath, Selective Coronary Angiography, LV angiography,  PTCA/Stent of the RCA  Indication: 78 year old white female who presents with an acute inferior lateral ST elevation myocardial infarction. She presented with acute nausea and vomiting. ECG showed 4-5 mm of ST elevation in the inferior leads with lesser ST elevation in lateral leads. While prepping the patient for the procedure she had recurrent episodes of ventricular fibrillation requiring defibrillation. She was emergently intubated. She developed complete heart block requiring external pacing. She was in cardiogenic shock.   Diagnostic Procedure Details: The right groin was prepped, draped, and anesthetized with 1% lidocaine. Using the modified Seldinger technique, a 7 French venous sheath was placed in the right femoral vein. Via this access a temporary pacing wire was advanced under fluoroscopy into the right ventricle for pacing. Again using a modified Seldinger technique a 6 French sheath was introduced into the right femoral artery. Standard Judkins catheters were used for selective coronary angiography. Catheter exchanges were performed over a wire.  At the end of the procedure despite adequate reperfusion of the infarct vessel patient remained severely hypotensive on IV pressors and intra-aortic balloon pump was placed via the right femoral access.  PROCEDURAL FINDINGS Hemodynamics: AO 85/45 with a mean of 56 mm mercury LV 85/23  Coronary angiography: Coronary dominance: right  Left mainstem: Normal  Left anterior descending (LAD): 90% ostial lesion followed by long segment of 50-60% disease in the proximal vessel. There was an 80-90% stenosis following the takeoff of the first diagonal.  Left circumflex (LCx): This a small vessel without significant disease.  Right coronary artery (RCA): This is a large dominant  vessel that is occluded in the mid vessel. No collaterals are seen.  Left ventriculography: Not performed  PCI Procedure Note:  Following the diagnostic procedure, the decision was made to proceed with PCI.  Weight-based bivalirudin was given for anticoagulation. Once a therapeutic ACT was achieved, a 6 [place] FR4 guide catheter was inserted.  A pro-letter coronary guidewire was used to cross the lesion.  The lesion was predilated with a 2.5 x 12 mm balloon.  The lesion was then stented with a 3.5x16 mm Veriflex stent.  The stent was postdilated with a 3.75 mm noncompliant balloon.  Following PCI, there was 0% residual stenosis and TIMI-3 flow. Final angiography confirmed an excellent result. At this point because of continued hemodynamic instability an intra-aortic balloon pump was placed via the right femoral access under fluoroscopic guidance. The patient was transferred to the intensive care unit for further aggressive treatment.  PCI Data: Vessel - RCA/Segment - mid Percent Stenosis (pre)  100% TIMI-flow 0 Stent 3.5 x 16 mm Veriflex Percent Stenosis (post) 0% TIMI-flow (post) 3  Final Conclusions:   1. Severe 2 vessel obstructive atherosclerotic coronary disease with the culprit lesion of the mid right coronary. 2. Successful intracoronary stenting of the mid right coronary with a bare-metal stent. 3. Cardiogenic shock with recurrent episodes of ventricular fibrillation, complete heart block, and persistent hypotension requiring IV pressors and intra-aortic balloon pump.  Recommendations: We'll transfer to the intensive care unit with continued ventilator and intra-aortic balloon pump support. Temporary pacing wires left in place. Patient will need NG tube placed in order to give her antiplatelet therapy. We will continue with IV Angiomax until after she has received oral antiplatelet therapy and then she can be transitioned to IV heparin. We'll obtain an echocardiogram to assess LV function.  Patient is critically ill.  Theron Arista [facility] 08/28/2011, 2:32 PM

## 2011-08-28 NOTE — Procedures (Signed)
Code Blue Note  Patient arrested with VT and PEA on multiple occasions.  Shocked x4.  Epi/atropine given.  Torsades noted and mag given.  Patient resuscitated successfully and it to be taken to the cath lab per cards.  Alyson Reedy, M.D. [facility] Pulmonary/Critical Care Medicine. Pager: [phone]. After hours pager: [phone].

## 2011-08-28 NOTE — Consult Note (Signed)
Name: Alicia Medina MRN: [id number] DOB: Jun 30, 1933    LOS: 0  PCCM ADMISSION NOTE  History of Present Illness:  78 y/o WF with unknown medical history presented to [facility] ED as STEMI with nausea, vomiting, ST elevation in inferior leads.  She was transported emergently to cath lab and during prep was noted to have recurrent episodes of V-Fib requiring defibrillation and emergent intubation.  Further developed complete heart block requiring external pacing with cardiogenic shock.  LAD with 90% ostial lesion, dominant RCA occluded.  Bare metal stent placed and intra-aortic balloon pump due to hemodynamic instability.  Coded in ICU post cath and returned to cath lab.  Lines / Drains: OETT 2/1>>> R Fem Venous Sheath 2/1>>> L Fem Arterial Sheath 2/1>>>  Cultures:   Antibiotics:   Tests / Events:    No past medical history on file.  No past surgical history on file.  Prior to Admission medications   Not on File    Allergies No Known Allergies  Family History No family history on file.  Social History  does not have a smoking history on file. She does not have any smokeless tobacco history on file. Her alcohol and drug histories not on file.  Review Of Systems: unable  Vital Signs: Pulse Rate:  [32] 32  (02/01 1323) Resp:  [25] 25  (02/01 1503) SpO2:  [100 %] 100 % (02/01 1503) FiO2 (%):  [60 %] 60 % (02/01 1503)    Physical Examination: General: elderly female on vent Neuro: sedate CV: s1s2- augmented with IABP PULM: resp's non-labored, lungs with bilateral crackles GI: obese/soft, bsx4 active Extremities: cool/dry, trace edema bilateral LE   Ventilator settings: Vent Mode:  [-] PRVC FiO2 (%):  [60 %] 60 % Set Rate:  [15 bmp] 15 bmp Vt Set:  [500 mL] 500 mL PEEP:  [5 cmH20] 5 cmH20 Plateau Pressure:  [16 cmH20] 16 cmH20  Labs    CBC  Lab 08/28/11 1415  HGB 11.1*  HCT 33.2*  WBC 16.0*  PLT 244     BMET No results found for this basename:  NA:5,K:2,CL:5,CO2:5,GLUCOSE:5,BUN:5,CREATININE:5,CALCIUM:5,MG:5,PHOS:5 in the last 168 hours   Lab 08/28/11 1415  INR 7.61*    No results found for this basename: PHART:5,PCO2:5,PCO2ART:5,PO2:5,PO2ART:5,HCO3:5,TCO2:5,O2SAT:5 in the last 168 hours  Radiology: 2/1 CXR>>>   Assessment and Plan:  STEMI, V-fib arrest s/p cardiac cath with bare metal stent placement 2/1.  Assessment: Recurrent v-fib with cardiogenic shock, complete heart block.  Cardiac cath with severe 2 vessel disease, bare metal stent placed RCA.  Required IABP and temp pacing wires. Coded post cath in ICU and taken back to cath lab. Plan: -Rec's per Cardiology -Levophed gtt for goal MAP >65 -d/c dopamine -now EKG  Acute Respiratory Failure Assessment: Secondary to cardiogenic shock Plan: -full vent support -f/u cxr to eval ETT and in am -no evidence for infection at this time -continuous sedation per protocol -abg in one hour and in am  Hypokalemia  Assessment:  Plan: -replete -recheck at 2200  Hyperglycemia Assessment:secondary to stress response Plan: -ICU SSI   Best practices / Disposition: -->Code Status:  Full Code -->DVT Px: post cath, angiomax etc per cardiology -->GI Px: protonix -->Diet: NPO    Canary Brim, NP-C [facility] Pgr: [phone]  08/28/2011, 3:12 PM  CC time of 60 min.  Patient seen and examined, agree with above note.  I dictated the care and orders written for this patient under my direction.  Koren Bound, M.D. [phone]

## 2011-08-28 NOTE — Progress Notes (Signed)
Chaplain's Note:  Responded to Code Blue.  Will inform chaplain who is following pt that she is going back to cath lab.  Please page if needed. [employer]  [phone]

## 2011-08-29 ENCOUNTER — Inpatient Hospital Stay (HOSPITAL_COMMUNITY): Payer: Medicare Other

## 2011-08-29 ENCOUNTER — Encounter (HOSPITAL_COMMUNITY): Payer: Self-pay | Admitting: *Deleted

## 2011-08-29 DIAGNOSIS — R570 Cardiogenic shock: Secondary | ICD-10-CM

## 2011-08-29 DIAGNOSIS — I2119 ST elevation (STEMI) myocardial infarction involving other coronary artery of inferior wall: Secondary | ICD-10-CM

## 2011-08-29 DIAGNOSIS — I442 Atrioventricular block, complete: Secondary | ICD-10-CM

## 2011-08-29 DIAGNOSIS — I252 Old myocardial infarction: Secondary | ICD-10-CM

## 2011-08-29 DIAGNOSIS — E119 Type 2 diabetes mellitus without complications: Secondary | ICD-10-CM

## 2011-08-29 DIAGNOSIS — J9600 Acute respiratory failure, unspecified whether with hypoxia or hypercapnia: Secondary | ICD-10-CM

## 2011-08-29 LAB — POCT I-STAT 3, ART BLOOD GAS (G3+)
Acid-base deficit: 6 mmol/L — ABNORMAL HIGH (ref 0.0–2.0)
Bicarbonate: 17.4 mEq/L — ABNORMAL LOW (ref 20.0–24.0)
Patient temperature: 38.1
pCO2 arterial: 32.5 mmHg — ABNORMAL LOW (ref 35.0–45.0)
pCO2 arterial: 27.1 mmHg — ABNORMAL LOW (ref 35.0–45.0)
pH, Arterial: 7.314 — ABNORMAL LOW (ref 7.350–7.400)
pO2, Arterial: 153 mmHg — ABNORMAL HIGH (ref 80.0–100.0)

## 2011-08-29 LAB — CBC
HCT: 34.3 % — ABNORMAL LOW (ref 36.0–46.0)
MCHC: 33.2 g/dL (ref 30.0–36.0)
Platelets: 317 10*3/uL (ref 150–400)
RDW: 15.4 % (ref 11.5–15.5)
WBC: 17.5 10*3/uL — ABNORMAL HIGH (ref 4.0–10.5)

## 2011-08-29 LAB — BASIC METABOLIC PANEL
BUN: 13 mg/dL (ref 6–23)
CO2: 18 mEq/L — ABNORMAL LOW (ref 19–32)
Chloride: 106 mEq/L (ref 96–112)
Chloride: 108 mEq/L (ref 96–112)
Creatinine, Ser: 0.91 mg/dL (ref 0.50–1.10)
GFR calc Af Amer: >90 mL/min (ref >90)
GFR calc Af Amer: 69 mL/min — ABNORMAL LOW (ref >90)
GFR calc non Af Amer: 60 mL/min — ABNORMAL LOW (ref >90)
Potassium: 5.1 mEq/L (ref 3.5–5.1)
Potassium: 4.4 mEq/L (ref 3.5–5.1)

## 2011-08-29 LAB — GLUCOSE, CAPILLARY
Glucose-Capillary: 200 mg/dL — ABNORMAL HIGH (ref 70–99)
Glucose-Capillary: 308 mg/dL — ABNORMAL HIGH (ref 70–99)
Glucose-Capillary: 179 mg/dL — ABNORMAL HIGH (ref 70–99)
Glucose-Capillary: 152 mg/dL — ABNORMAL HIGH (ref 70–99)
Glucose-Capillary: 161 mg/dL — ABNORMAL HIGH (ref 70–99)
Glucose-Capillary: 166 mg/dL — ABNORMAL HIGH (ref 70–99)
Glucose-Capillary: 169 mg/dL — ABNORMAL HIGH (ref 70–99)
Glucose-Capillary: 176 mg/dL — ABNORMAL HIGH (ref 70–99)
Glucose-Capillary: 177 mg/dL — ABNORMAL HIGH (ref 70–99)
Glucose-Capillary: 175 mg/dL — ABNORMAL HIGH (ref 70–99)
Glucose-Capillary: 164 mg/dL — ABNORMAL HIGH (ref 70–99)

## 2011-08-29 LAB — CARDIAC PANEL(CRET KIN+CKTOT+MB+TROPI): CK, MB: 36.5 ng/mL (ref 0.3–4.0)

## 2011-08-29 LAB — HEMOGLOBIN A1C
Hgb A1c MFr Bld: 7.3 % — ABNORMAL HIGH (ref <5.7)
Mean Plasma Glucose: 163 mg/dL — ABNORMAL HIGH (ref <117)

## 2011-08-29 LAB — ABO/RH: ABO/RH(D): O POS

## 2011-08-29 LAB — PROTIME-INR
INR: 1.22 (ref 0.00–1.49)
Prothrombin Time: 15.7 seconds — ABNORMAL HIGH (ref 11.6–15.2)

## 2011-08-29 LAB — APTT: aPTT: 36 seconds (ref 24–37)

## 2011-08-29 LAB — MAGNESIUM: Magnesium: 2.1 mg/dL (ref 1.5–2.5)

## 2011-08-29 LAB — MRSA PCR SCREENING: MRSA by PCR: NEGATIVE

## 2011-08-29 MED ORDER — INSULIN GLARGINE 100 UNIT/ML ~~LOC~~ SOLN
24.0000 [IU] | Freq: Every day | SUBCUTANEOUS | Status: DC
  Administered 2011-08-29 – 2011-09-04 (×7): 24 [IU] via SUBCUTANEOUS
  Filled 2011-08-29: qty 3

## 2011-08-29 MED ORDER — DEXTROSE 10 % IV SOLN
INTRAVENOUS | Status: DC
Start: 2011-08-29 — End: 2011-09-05

## 2011-08-29 MED ORDER — SODIUM CHLORIDE 0.9 % IV BOLUS (SEPSIS)
500.0000 mL | Freq: Once | INTRAVENOUS | Status: AC
  Administered 2011-08-29: 500 mL via INTRAVENOUS

## 2011-08-29 MED ORDER — POTASSIUM PHOSPHATE DIBASIC 3 MMOLE/ML IV SOLN
15.0000 mmol | Freq: Once | INTRAVENOUS | Status: AC
  Administered 2011-08-29: 15 mmol via INTRAVENOUS
  Filled 2011-08-29: qty 5

## 2011-08-29 MED ORDER — CLOPIDOGREL BISULFATE 75 MG PO TABS
75.0000 mg | ORAL_TABLET | Freq: Every day | ORAL | Status: DC
  Administered 2011-08-29 – 2011-09-02 (×5): 75 mg via ORAL
  Filled 2011-08-29 (×6): qty 1

## 2011-08-29 MED ORDER — ASPIRIN 81 MG PO CHEW
CHEWABLE_TABLET | ORAL | Status: AC
  Administered 2011-08-29: 01:00:00
  Filled 2011-08-29: qty 4

## 2011-08-29 MED ORDER — LEVOTHYROXINE SODIUM 100 MCG PO TABS
100.0000 ug | ORAL_TABLET | Freq: Every day | ORAL | Status: DC
  Administered 2011-08-30 – 2011-09-02 (×4): 100 ug via ORAL
  Filled 2011-08-29 (×5): qty 1

## 2011-08-29 MED ORDER — HEPARIN SOD (PORCINE) IN D5W 100 UNIT/ML IV SOLN
1250.0000 [IU]/h | INTRAVENOUS | Status: DC
  Administered 2011-08-30 (×2): 1250 [IU]/h via INTRAVENOUS
  Filled 2011-08-29 (×2): qty 250

## 2011-08-29 MED ORDER — INSULIN ASPART 100 UNIT/ML ~~LOC~~ SOLN
0.0000 [IU] | SUBCUTANEOUS | Status: DC
  Administered 2011-08-29 – 2011-08-30 (×4): 3 [IU] via SUBCUTANEOUS
  Administered 2011-08-30 – 2011-08-31 (×5): 1 [IU] via SUBCUTANEOUS
  Administered 2011-08-31: 4 [IU] via SUBCUTANEOUS
  Administered 2011-08-31: 3 [IU] via SUBCUTANEOUS
  Administered 2011-08-31 (×2): 1 [IU] via SUBCUTANEOUS
  Administered 2011-09-01: 4 [IU] via SUBCUTANEOUS
  Administered 2011-09-01: 1 [IU] via SUBCUTANEOUS

## 2011-08-29 MED ORDER — VANCOMYCIN HCL 1000 MG IV SOLR
750.0000 mg | Freq: Two times a day (BID) | INTRAVENOUS | Status: DC
  Administered 2011-08-29 – 2011-08-31 (×6): 750 mg via INTRAVENOUS
  Filled 2011-08-29 (×8): qty 750

## 2011-08-29 MED FILL — Medication: Qty: 1 | Status: AC

## 2011-08-29 NOTE — Progress Notes (Signed)
Name: STEPHENE ALEGRIA MRN: [id number] DOB: 07-Jan-1933  ELECTRONIC ICU PHYSICIAN NOTE  Problem:  Hypotension requiring pressors  Intervention:  NS x 500 cc (cxr no edema) - ideally needs cvl / cvp monitor but will need to work this out with cardiology as notes reviewed indicate very high risk thrombosis if anticoagulation held so risk of procedure will be high  [name] 08/29/2011, 6:54 AM

## 2011-08-29 NOTE — Progress Notes (Signed)
FFP orders received per CCM.  I notified attending of new orders, and attending MD orders STAT labs first.  Will update MD when labs result.

## 2011-08-29 NOTE — Progress Notes (Signed)
ANTICOAGULATION CONSULT NOTE - Follow up Consult  Pharmacy Consult for Heparin, Integrelin Indication: chest pain/ACS  No Known Allergies  Patient Measurements: Height: 5\' 7"  (170.2 cm) Weight: 189 lb 6 oz (85.9 kg) IBW/kg (Calculated) : 61.6  Heparin Dosing Weight: 80 kg  Vital Signs: Temp: 99.5 F (37.5 C) (02/02 1430) Temp src: Core (Comment) (02/02 0715) BP: 116/47 mmHg (02/02 1430) Pulse Rate: 73  (02/02 1430)  Labs:  Basename 08/29/11 1333 08/29/11 0210 08/29/11 0209 08/28/11 2231 08/28/11 1415 08/28/11 1412 08/28/11 1315  HGB -- -- 11.4* -- 11.1* -- --  HCT -- -- 34.3* -- 33.2* 35.0* --  PLT -- -- 317 -- 244 -- --  APTT -- -- 36 -- 123* -- --  LABPROT -- -- 15.7* -- 65.4* -- --  INR -- -- 1.22 -- 7.61* -- --  HEPARINUNFRC 0.13* -- -- -- -- -- --  CREATININE -- 0.91 -- 0.75 0.63 -- --  CKTOTAL -- -- -- -- -- -- 68  CKMB -- -- -- -- -- -- 3.6  TROPONINI -- -- -- -- -- -- <0.30   Estimated Creatinine Clearance: 59.2 ml/min (by C-G formula based on Cr of 0.91).  Medical History: Past Medical History  Diagnosis Date  . Hypothyroidism, postsurgical   . Hypertension   . Hyperlipidemia   . Diabetes mellitus     Assessment: 78 year old woman admitted with STEMI and V-Fib started is s/p cardiac cath to continue on Integrelin post cath.  Heparin charted at 0331 (delayed start). Heparin level of 0.13 subtherapeutic;  2-h delayed in collecting. Heparin gtt at 950 units/hr.  SCr trending up (CrCl ~55-60 ml/min), CBC stable.  No current bleeding or line issues per RN. Had some issues with groin site overnight, ok now.   Goal of Therapy:  Heparin level 0.3-0.5 while on Integrelin   Plan:  1) Increase heparin drip to 1150 units/hr  2) Follow up heparin level 8 hours later 3) Monitor CBC, renal function  Concha Norway 08/29/2011,2:47 PM

## 2011-08-29 NOTE — Progress Notes (Signed)
Name: Alicia Medina MRN: [id number] DOB: 14-Sep-1932  ELECTRONIC ICU PHYSICIAN NOTE  Problem:  Hypercarbia on PRVC 15, not air trapping  Intervention:  Increase PRVC to 20       [name] 08/29/2011, 12:00 AM

## 2011-08-29 NOTE — Progress Notes (Signed)
Patient ID: Alicia Medina, female   DOB: October 25, 1932, 78 y.o.   MRN: [id number]  On call cardiology fellow cross cover note   Pt's INR 7 by labs earlier today. PCCM rec'd giving FFP.  RN called to verify that with me.   Given recent stent and stent thrombosis - do not want to give pt any blood products as this can make pt procoagulable and put pt at risk for another stent thrombosis.  In addition, sometimes other anticoagulants can falsely increase the INR.  Checked stat coags and CBC.  INR normal.  HGb stable.  Advised RN to NOT give FFP and to relay message to PCCM.  Restarted heparin at this time (had been held mult times tonight given groin bleed, rebleeding).    Also called earlier in the night for question of change in waveforms on IABP after pt was moved.  It appeared consistent with possible late inflation.  Stat CXR confirmed that IABP had not moved.  Changed tele stickers.  Pt with better waveforms after changing to 1:2 and back to 1:1.  Pt with stable UOP and stable MAPs with augmentation.  Will continue to monitor   Taraann Olthoff, Valli Glance

## 2011-08-29 NOTE — Progress Notes (Signed)
Name: MICAELA STITH MRN: [id number] DOB: Dec 17, 1932  ELECTRONIC ICU PHYSICIAN NOTE  Problem:  Shock requiring peripheral levophed  Intervention:  Vol expand with NS and FFP - ideally needs cvl but with elevated inr/ integrulin/heparin/asa/plavix she is very high risk bleed from invasive line attempt  [name] 08/29/2011, 12:08 AM

## 2011-08-29 NOTE — Progress Notes (Signed)
CRITICAL VALUE ALERT  Critical value received:  CKMB 36.5 Trop 8.91   Date of notification:  08/29/2011  Time of notification:  2100  Critical value read back:yes  Nurse who received alert:  Khalia Gong, Lytle Butte  Time of first page:  2100  Responding MD:  Theodore Demark, PA  Time MD responded:  2111

## 2011-08-29 NOTE — Progress Notes (Signed)
ANTIBIOTIC CONSULT NOTE - INITIAL  Pharmacy Consult for Vancomycin Indication: Empiric/leukocytosis  No Known Allergies  Patient Measurements: Height: 5\' 7"  (170.2 cm) Weight: 189 lb 6 oz (85.9 kg) IBW/kg (Calculated) : 61.6  Adjusted Body Weight: 71.3 kg  Vital Signs: Temp: 99.7 F (37.6 C) (02/02 0930) Temp src: Core (Comment) (02/02 0715) BP: 126/40 mmHg (02/02 0930) Pulse Rate: 72  (02/02 0930) Intake/Output from previous day: 02/01 0701 - 02/02 0700 In: 3729.7 [I.V.:3429.7; NG/GT:100; IV Piggyback:200] Out: 1195 [Urine:1195] Intake/Output from this shift: Total I/O In: 652.1 [I.V.:652.1] Out: 73 [Urine:73]  Labs:  Basename 08/29/11 0210 08/29/11 0209 08/28/11 2231 08/28/11 1415 08/28/11 1412  WBC -- 17.5* -- 16.0* --  HGB -- 11.4* -- 11.1* 11.9*  PLT -- 317 -- 244 --  LABCREA -- -- -- -- --  CREATININE 0.91 -- 0.75 0.63 --   Estimated Creatinine Clearance: 59.2 ml/min (by C-G formula based on Cr of 0.91). No results found for this basename: VANCOTROUGH:2,VANCOPEAK:2,VANCORANDOM:2,GENTTROUGH:2,GENTPEAK:2,GENTRANDOM:2,TOBRATROUGH:2,TOBRAPEAK:2,TOBRARND:2,AMIKACINPEAK:2,AMIKACINTROU:2,AMIKACIN:2, in the last 72 hours   Microbiology: Recent Results (from the past 720 hour(s))  MRSA PCR SCREENING     Status: Normal   Collection Time   08/29/11  1:58 AM      Component Value Range Status Comment   MRSA by PCR NEGATIVE  NEGATIVE  Final     Medical History: Past Medical History  Diagnosis Date  . Hypothyroidism, postsurgical   . Hypertension   . Hyperlipidemia   . Diabetes mellitus     Medications:  Scheduled:    . antiseptic oral rinse  15 mL Mouth Rinse QID  . aspirin      . aspirin  325 mg Oral Daily  . atropine      . bivalirudin      . bivalirudin      . bivalirudin      . chlorhexidine  15 mL Mouth Rinse BID  . clopidogrel      . clopidogrel  300 mg Per Tube Once  . clopidogrel  75 mg Oral Q breakfast  . DOPamine      . heparin      . heparin       . hydrocortisone sodium succinate  100 mg Intravenous Daily  . hydrocortisone sodium succinate  50 mg Intravenous Q6H  . insulin aspart  0-4 Units Subcutaneous Q4H  . insulin glargine  24 Units Subcutaneous Daily  . levothyroxine  100 mcg Oral QAC breakfast  . lidocaine      . lidocaine      . lidocaine-EPINEPHrine      . midazolam      . midazolam      . nitroGLYCERIN      . nitroGLYCERIN      . pantoprazole (PROTONIX) IV  40 mg Intravenous Q12H  . potassium chloride  10 mEq Intravenous Q1 Hr x 6  . potassium chloride      . potassium chloride      . potassium chloride      . sodium chloride  500 mL Intravenous Once  . sodium chloride  500 mL Intravenous Once  . DISCONTD: clopidogrel  300 mg Per Tube Once  . DISCONTD: hydrocortisone sodium succinate  50 mg Intravenous Q6H  . DISCONTD: insulin aspart  0-3 Units Subcutaneous Q4H  . DISCONTD: insulin aspart  0-3 Units Subcutaneous Q4H  . DISCONTD: insulin aspart  0-3 Units Subcutaneous Q4H   Assessment: 78 year old woman admitted with STEMI and V-Fib s/p cardiac cath on Heparin  and Integrelin to start vancomycin for empiric coverage/leukocytosis given instrumentation. Has temporary pacemaker and IABP in place. Tmax 100.8, WBC 17.8, No cultures. Increase in SCr to 0.91, CrCl ~50-60 ml/min.     Goal of Therapy:  Vancomycin trough level 15-20 mcg/ml  Plan:  1. Vancomycin 750 mg IV q12h  2. Follow up culture results, renal function, trough level at steady state  Concha Norway 08/29/2011,10:24 AM

## 2011-08-29 NOTE — Progress Notes (Addendum)
Subjective: 78 year old white female who presented 2/1 with an acute ST elevation inferior lateral myocardial infarction associated with cardiogenic shock. She had multiple episodes of ventricular fibrillation and complete heart block. She had stenting of the mid right coronary. She was transferred to the intensive care unit with temporary pacemaker and intra-aortic balloon pump in place. She has had multiple episodes of ventricular fibrillation since then with hemodynamic instability. ECG shows persistent marked ST elevation inferiorly.  In the ICU, patient arrested with VT and PEA on multiple occasions. Shocked x4. Epi/atropine given. Torsades noted and mag given. Patient resuscitated successfully and returned to cath lab with acute stent re-occlusion. Repeat stenting performed.  Remains on IABP 1:1. Levophed 20 mcg. Augmented MAPs in 80s. Intubated/sedated. Did well during wake-up assessment. Low grade-temps. No significant pacing. Had some groin bleeding last night. Now stable. Remains on integrelin.   IABP advanced this am based on CXR position. Plavix fell off MAR.  Objective: Vital signs in last 24 hours: Filed Vitals:   08/29/11 0530 08/29/11 0545 08/29/11 0600 08/29/11 0615  BP: 95/33 88/57 100/35 110/70  Pulse: 72 71 70 70  Temp: 100.2 F (37.9 C) 100.2 F (37.9 C) 100.2 F (37.9 C) 100.2 F (37.9 C)  TempSrc: Core (Comment) Core (Comment) Core (Comment) Core (Comment)  Resp: 20 20 20 20   Height:      Weight:      SpO2: 100% 100% 100% 100%   Weight change:   Intake/Output Summary (Last 24 hours) at 08/29/11 0624 Last data filed at 08/29/11 0604  Gross per 24 hour  Intake 3579.14 ml  Output   1170 ml  Net 2409.14 ml   Physical Exam: General: sedated on ventilator, critically ill-appearing   Cardiac: S1S2, augmented by IABP   Pulm: mild crackles at bases, moving normal volumes of air (anteriorly) Abd: soft, nontender, nondistended, BS present  Ext: warm and well  perfused, no pedal edema . R groin with ecchymosis. Soft. No bruit Neuro: sedated  Lab Results: Basic Metabolic Panel:  Lab 08/29/11 1610 08/28/11 2231  NA 136 136  K 4.4 5.1  CL 108 106  CO2 18* 18*  GLUCOSE 276* 406*  BUN 12 13  CREATININE 0.91 0.75  CALCIUM 7.7* 7.9*  MG 2.1 --  PHOS 1.6* --   Liver Function Tests:  Lab 08/28/11 1415  AST 26  ALT 27  ALKPHOS 38*  BILITOT 0.2*  PROT 5.6*  ALBUMIN 2.9*   CBC:  Lab 08/29/11 0209 08/28/11 1415  WBC 17.5* 16.0*  NEUTROABS -- --  HGB 11.4* 11.1*  HCT 34.3* 33.2*  MCV 84.7 83.4  PLT 317 244   Cardiac Enzymes:  Lab 08/28/11 1315  CKTOTAL 68  CKMB 3.6  CKMBINDEX --  TROPONINI <0.30   CBG:  Lab 08/29/11 0602 08/29/11 0507 08/29/11 0413 08/29/11 0306 08/29/11 0203 08/29/11 0108  GLUCAP 179* 179* 200* 224* 268* 280*   Hemoglobin A1C:  Lab 08/28/11 1415  HGBA1C 7.3*   Fasting Lipid Panel:  Lab 08/28/11 1415  CHOL 150  HDL 38*  LDLCALC 86  TRIG 960  CHOLHDL 3.9  LDLDIRECT --   Thyroid Function Tests:  Lab 08/28/11 1927  TSH 1.552  T4TOTAL --  FREET4 --  T3FREE --  THYROIDAB --   Coagulation:  Lab 08/29/11 0209 08/28/11 1415  LABPROT 15.7* 65.4*  INR 1.22 7.61*    Micro Results: Recent Results (from the past 240 hour(s))  MRSA PCR SCREENING     Status: Normal  Collection Time   08/29/11  1:58 AM      Component Value Range Status Comment   MRSA by PCR NEGATIVE  NEGATIVE  Final    Studies/Results: Dg Chest Port 1 View  08/28/2011  *RADIOLOGY REPORT*  Clinical Data: Intra-aortic balloon pump placement  PORTABLE CHEST - 1 VIEW  Comparison: 08/28/2011  Findings: Endotracheal tube tip positioned 3.2 cm proximal to the carina.  NG tube descends into and coils over the gastric fundus. Intraaortic balloon pump again noted.  The marker is located 7.7 cm below the level of the top of the aortic arch.  Low lung volumes with interstitial and bibasilar opacities, similar to prior. Superimposed edema  pattern is unchanged.  No acute osseous abnormality.  IMPRESSION: Intraaortic balloon pump, with marker positioned 7.7 cm below the top of the aortic arch.  Unchanged endotracheal tube and nasogastric tube.  Bibasilar opacities and mild edema pattern.  Original Report Authenticated By: Waneta Martins, M.D.   Dg Chest Port 1 View  08/28/2011  *RADIOLOGY REPORT*  Clinical Data: Status post cardiac catheterization.  PORTABLE CHEST - 1 VIEW  Comparison: Chest 04/29/2007.  Findings: Endotracheal tube is in place with tip in good position just below the clavicular heads.  NG tube has its tip in the fundus of the stomach. Intra-aortic balloon pump is identified.  The marker is 8.3 cm below the top of the aortic arch.  Lung volumes are low with crowding of the bronchovascular structures. Indistinctness of the pulmonary vasculature is compatible with mild interstitial edema.  Heart size is normal.  IMPRESSION:  1.  Intra-aortic balloon pump is positioned with the marker 8.3 cm below the top of the aortic arch. 2.  ET tube and NG tube in good position. 3.  Mild interstitial edema.  Original Report Authenticated By: Bernadene Bell. Maricela Curet, M.D.   Medications:  Scheduled Meds:   . antiseptic oral rinse  15 mL Mouth Rinse QID  . aspirin      . aspirin  325 mg Oral Daily  . atropine      . bivalirudin      . bivalirudin      . bivalirudin      . chlorhexidine  15 mL Mouth Rinse BID  . clopidogrel      . clopidogrel  300 mg Per Tube Once  . DOPamine      . heparin      . heparin      . hydrocortisone sodium succinate  100 mg Intravenous Daily  . hydrocortisone sodium succinate  50 mg Intravenous Q6H  . lidocaine      . lidocaine      . lidocaine-EPINEPHrine      . midazolam      . midazolam      . nitroGLYCERIN      . nitroGLYCERIN      . pantoprazole (PROTONIX) IV  40 mg Intravenous Q12H  . potassium chloride  10 mEq Intravenous Q1 Hr x 6  . potassium chloride      . potassium chloride      .  potassium chloride      . sodium chloride  500 mL Intravenous Once  . DISCONTD: clopidogrel  300 mg Per Tube Once  . DISCONTD: hydrocortisone sodium succinate  50 mg Intravenous Q6H  . DISCONTD: insulin aspart  0-3 Units Subcutaneous Q4H  . DISCONTD: insulin aspart  0-3 Units Subcutaneous Q4H  . DISCONTD: insulin aspart  0-3 Units Subcutaneous Q4H  Continuous Infusions:   . sodium chloride 75 mL/hr at 08/29/11 0604  . sodium chloride    . sodium chloride 10 mL/hr at 08/29/11 0604  . sodium chloride 10 mL/hr at 08/29/11 0604  . amiodarone (NEXTERONE PREMIX) 360 mg/200 mL dextrose 30 mg/hr (08/29/11 0604)  . eptifibatide 2 mcg/kg/min (08/29/11 0604)  . fentaNYL infusion INTRAVENOUS 125 mcg/hr (08/29/11 0604)  . heparin 950 Units/hr (08/29/11 0604)  . insulin (NOVOLIN-R) infusion 4.8 Units/hr (08/29/11 0604)  . midazolam (VERSED) infusion 4 mg/hr (08/29/11 0604)  . norepinephrine (LEVOPHED) Adult infusion 25 mcg/min (08/29/11 0604)  . DISCONTD: dextrose    . DISCONTD: dextrose    . DISCONTD: dextrose    . DISCONTD: fentaNYL infusion INTRAVENOUS    . DISCONTD: insulin (NOVOLIN-R) infusion    . DISCONTD: midazolam (VERSED) infusion    . DISCONTD: norepinephrine (LEVOPHED) Adult infusion 35 mcg/min (08/28/11 2201)   PRN Meds:.fentaNYL, midazolam, ondansetron (ZOFRAN) IV, DISCONTD: ondansetron (ZOFRAN) IV  Assessment/Plan: 1) STEMI: s/p RCA bare-metal stenting with temporary pacemaker & intra-aortic balloon pump day #1.   - Continue Integrilin, Plavix, heparin gtt and ASA  - 2-D echo to check ventricular function: pending.   -Start statin  -Cycle CEs  2) Cardiogenic shock: IABP.  - Pressor support with Levophed.  MAP is 70-80s -PCCM on board -CXR today appears slightly improved from previous. Repeat CXR in AM  3) DM 2: HbA1c 7.3, adequately controlled.  - Sensitive sliding scale.   4) Hypothyroidism: TSH wnl - resume synthroid  5) Groin hematoma - stable  6) VDRF - p/ccm  on baord  7) Heart block - TVP in.Resolved  8) Leukocytosis - likely due to shock/STEMI. However given all the instrumentation she has had and remaining equipment in place will cover with Vancomycin for the short-term.   LOS: 1 day   HO,MICHELE 08/29/2011, 6:24 AM  Patient seen and examined with Dr. Anselm Jungling. We discussed all aspects of the encounter. I agree with the assessment and plan as stated above.   She is much more stable. Will begin to wean Levophed. Continue IABP for now. Hopefully can wean tomorrow. Check echo. No b-blocker yet due to shock. Restart Plavix. Continue integrelin. Watch groin closely. Will keep TVP in for now and likely pull tomorrow.  Husband updated.  Daniel Bensimhon,MD 9:32 AM

## 2011-08-29 NOTE — Progress Notes (Signed)
Name: Alicia Medina MRN: [id number] DOB: 28-Apr-1933    LOS: 1  PCCM PROGRESS NOTE  History of Present Illness:  78 y/o WF with unknown medical history presented to [facility] ED as STEMI with nausea, vomiting, ST elevation in inferior leads.  She was transported emergently to cath lab and during prep was noted to have recurrent episodes of V-Fib requiring defibrillation and emergent intubation.  Further developed complete heart block requiring external pacing with cardiogenic shock.  LAD with 90% ostial lesion, dominant RCA occluded.  Bare metal stent placed and intra-aortic balloon pump due to hemodynamic instability.  Coded in ICU post cath and returned to cath lab.  Lines / Drains: OETT 2/1>>> R Fem Venous Sheath 2/1>>> L Fem Arterial Sheath 2/1>>>  Cultures: 2/2 mrsa PCR: neg  Antibiotics: 2/2  Vancomycin  Tests / Events:   Vital Signs: Temp:  [92.8 F (33.8 C)-100.8 F (38.2 C)] 99.7 F (37.6 C) (02/02 0845) Pulse Rate:  [32-103] 72  (02/02 0845) Resp:  [12-25] 20  (02/02 0845) BP: (82-137)/(29-90) 117/38 mmHg (02/02 0845) SpO2:  [96 %-100 %] 100 % (02/02 0845) FiO2 (%):  [40 %-60 %] 40 % (02/02 0845) Weight:  [85.9 kg (189 lb 6 oz)-86 kg (189 lb 9.5 oz)] 85.9 kg (189 lb 6 oz) (02/02 0630) I/O last 3 completed shifts: In: 3729.7 [I.V.:3429.7; NG/GT:100; IV Piggyback:200] Out: 1195 [Urine:1195]  Physical Examination: General: elderly female on vent Neuro: sedate, but wakes up and tries to communicate CV: s1s2- augmented with IABP PULM: resp's non-labored, lungs with bilateral crackles GI: obese/soft, bsx4 active Extremities: cool/dry, trace edema bilateral LE   Ventilator settings: Vent Mode:  [-] PRVC FiO2 (%):  [40 %-60 %] 40 % Set Rate:  [15 bmp-20 bmp] 20 bmp Vt Set:  [500 mL] 500 mL PEEP:  [5 cmH20] 5 cmH20 Plateau Pressure:  [14 cmH20-19 cmH20] 17 cmH20  Labs    CBC  Lab 08/29/11 0209 08/28/11 1415 08/28/11 1412  HGB 11.4* 11.1* 11.9*  HCT 34.3* 33.2*  35.0*  WBC 17.5* 16.0* --  PLT 317 244 --     BMET  Lab 08/29/11 0210 08/28/11 2231 08/28/11 1415 08/28/11 1412 08/28/11 1341  NA 136 136 139 142 142  K 4.4 5.1 -- -- --  CL 108 106 106 107 108  CO2 18* 18* 18* -- --  GLUCOSE 276* 406* 294* 285* 287*  BUN 12 13 15 13 14   CREATININE 0.91 0.75 0.63 0.70 0.80  CALCIUM 7.7* 7.9* 7.6* -- --  MG 2.1 -- -- -- --  PHOS 1.6* -- -- -- --     Lab 08/29/11 0209 08/28/11 1415  INR 1.22 7.61*     Lab 08/29/11 0346 08/28/11 2324 08/28/11 1413 08/28/11 1412 08/28/11 1341  PHART 7.314* 7.210* 7.257* -- --  PCO2ART 32.5* 42.9 44.1 -- --  PO2ART 195.0* 194.0* 278.0* -- --  HCO3 16.3* 17.1* 19.7* -- --  TCO2 17 18 21 20 20   O2SAT 100.0 99.0 100.0 -- --    Radiology: 2/2 CXR1. Overall stable appearance of support apparatus aside from smal, positional change in the intra-aortic balloon pump.2. Low volume chest with prominent bilateral basilar atelectasis.3. Probable interstitial pulmonary edema.   Assessment and Plan:  Encephalopathy secondary to sedation, ? anoxia -daily WUA -continuous sedation protocol  CAD with severe 2 vessel disease, STEMI, V-fib arrest - s/p cardiac cath with bare metal stent placement 2/1. Cardiogenic shock on pressors / IABP.  Complete heart block.   -Rec's per  Cardiology -Levophed gtt for goal MAP >65, trying to wean to off today and then wean IABP in am -cont plavix, heparin and integralin per Cards  Acute Respiratory Failure secondary to cardiogenic shock  Lab 08/29/11 1030 08/29/11 0346 08/28/11 2324 08/28/11 1413 08/28/11 1412  PHART 7.416* 7.314* 7.210* 7.257* --  PCO2ART 27.1* 32.5* 42.9 44.1 --  PO2ART 153.0* 195.0* 194.0* 278.0* --  HCO3 17.4* 16.3* 17.1* 19.7* --  TCO2 18 17 18 21 20   O2SAT 99.0 100.0 99.0 100.0 --   -full vent support -abg  in am  Hyperglycemia secondary to stress response and steroids  Lab 08/29/11 1159 08/29/11 1106 08/29/11 1003 08/29/11 0858 08/29/11 0755  GLUCAP  176* 166* 177* 169* 152*   -SSI -Lantus  Hypophosphatemia -replace  Hypothyroidism -Synthroid  Elevated WBC (?stress, ?steroids)  Lab 08/29/11 0209 08/28/11 1415  WBC 17.5* 16.0*   -Empirical Vancomycin given active IABP  Best practices / Disposition -Code Status:  Full Code -DVT Px: post cath, angiomax etc per cardiology -GI Px: protonix -NPO  Patient examined.  Records reviewed.  Agree with assessment and plan as above.  The patient is critically ill with multiple organ systems failure and requires high complexity decision making for assessment and support, frequent evaluation and titration of therapies, application of advanced monitoring technologies and extensive interpretation of multiple databases. Critical Care Time devoted to patient care services described in this note is 35 minutes.  Orlean Bradford, M.D. Pulmonary and Critical Care Medicine [facility] Cell: [phone] Pager: [phone]

## 2011-08-29 NOTE — Progress Notes (Signed)
After slightly turning pt onto side; noticed that waveform on IABP screen had changed; MD notified; stat port CXR done to verify placement; MD confirmed placement and advised me, RN to ensure that Mean is 60 or above, pt is still producing urine adequately and pt appears stable otherwise; will continue to monitor closely and update as needed

## 2011-08-30 ENCOUNTER — Inpatient Hospital Stay (HOSPITAL_COMMUNITY): Payer: Medicare Other

## 2011-08-30 DIAGNOSIS — J9600 Acute respiratory failure, unspecified whether with hypoxia or hypercapnia: Secondary | ICD-10-CM

## 2011-08-30 DIAGNOSIS — I469 Cardiac arrest, cause unspecified: Secondary | ICD-10-CM

## 2011-08-30 DIAGNOSIS — R570 Cardiogenic shock: Secondary | ICD-10-CM

## 2011-08-30 DIAGNOSIS — R4182 Altered mental status, unspecified: Secondary | ICD-10-CM

## 2011-08-30 DIAGNOSIS — I359 Nonrheumatic aortic valve disorder, unspecified: Secondary | ICD-10-CM

## 2011-08-30 HISTORY — PX: TRANSTHORACIC ECHOCARDIOGRAM: SHX275

## 2011-08-30 LAB — BASIC METABOLIC PANEL
BUN: 6 mg/dL (ref 6–23)
Calcium: 7.4 mg/dL — ABNORMAL LOW (ref 8.4–10.5)
Creatinine, Ser: 0.57 mg/dL (ref 0.50–1.10)
GFR calc Af Amer: >90 mL/min (ref >90)
GFR calc non Af Amer: 88 mL/min — ABNORMAL LOW (ref >90)
Glucose, Bld: 179 mg/dL — ABNORMAL HIGH (ref 70–99)
Potassium: 3.5 mEq/L (ref 3.5–5.1)

## 2011-08-30 LAB — PREPARE FRESH FROZEN PLASMA: Unit division: 0

## 2011-08-30 LAB — PROCALCITONIN: Procalcitonin: 0.48 ng/mL

## 2011-08-30 LAB — CBC
HCT: 27.9 % — ABNORMAL LOW (ref 36.0–46.0)
Hemoglobin: 9.7 g/dL — ABNORMAL LOW (ref 12.0–15.0)
MCH: 28.5 pg (ref 26.0–34.0)
MCHC: 34.8 g/dL (ref 30.0–36.0)
MCV: 82.1 fL (ref 78.0–100.0)
RDW: 16 % — ABNORMAL HIGH (ref 11.5–15.5)

## 2011-08-30 LAB — CARDIAC PANEL(CRET KIN+CKTOT+MB+TROPI)
CK, MB: 23.3 ng/mL (ref 0.3–4.0)
Relative Index: 3.4 — ABNORMAL HIGH (ref 0.0–2.5)
Troponin I: 6.81 ng/mL (ref <0.30)
Troponin I: 5 ng/mL (ref <0.30)

## 2011-08-30 LAB — GLUCOSE, CAPILLARY
Glucose-Capillary: 152 mg/dL — ABNORMAL HIGH (ref 70–99)
Glucose-Capillary: 160 mg/dL — ABNORMAL HIGH (ref 70–99)
Glucose-Capillary: 193 mg/dL — ABNORMAL HIGH (ref 70–99)
Glucose-Capillary: 148 mg/dL — ABNORMAL HIGH (ref 70–99)

## 2011-08-30 LAB — PHOSPHORUS: Phosphorus: 2.2 mg/dL — ABNORMAL LOW (ref 2.3–4.6)

## 2011-08-30 MED ORDER — HEPARIN SODIUM (PORCINE) 5000 UNIT/ML IJ SOLN
5000.0000 [IU] | Freq: Three times a day (TID) | INTRAMUSCULAR | Status: DC
  Administered 2011-08-30 – 2011-09-05 (×15): 5000 [IU] via SUBCUTANEOUS
  Filled 2011-08-30 (×20): qty 1

## 2011-08-30 MED ORDER — METOPROLOL TARTRATE 12.5 MG HALF TABLET
12.5000 mg | ORAL_TABLET | Freq: Two times a day (BID) | ORAL | Status: DC
  Administered 2011-08-30 – 2011-08-31 (×3): 12.5 mg via ORAL
  Filled 2011-08-30 (×5): qty 1

## 2011-08-30 MED ORDER — ATROPINE SULFATE 1 MG/ML IJ SOLN
INTRAMUSCULAR | Status: AC
  Filled 2011-08-30: qty 1

## 2011-08-30 MED ORDER — POTASSIUM CHLORIDE 20 MEQ/15ML (10%) PO LIQD
ORAL | Status: AC
  Administered 2011-08-30: 40 meq
  Filled 2011-08-30: qty 30

## 2011-08-30 MED ORDER — POTASSIUM CHLORIDE CRYS ER 20 MEQ PO TBCR: 40.0000 meq | EXTENDED_RELEASE_TABLET | Freq: Once | ORAL | Status: DC

## 2011-08-30 MED ORDER — METOPROLOL TARTRATE 1 MG/ML IV SOLN: 2.5000 mg | INTRAVENOUS | Status: DC | PRN

## 2011-08-30 MED ORDER — DIGOXIN 125 MCG PO TABS
0.1250 mg | ORAL_TABLET | Freq: Every day | ORAL | Status: DC
  Administered 2011-08-30 – 2011-09-02 (×4): 0.125 mg via ORAL
  Filled 2011-08-30 (×5): qty 1

## 2011-08-30 MED ORDER — ROSUVASTATIN CALCIUM 40 MG PO TABS
40.0000 mg | ORAL_TABLET | Freq: Every day | ORAL | Status: DC
  Administered 2011-08-30 – 2011-09-04 (×6): 40 mg via ORAL
  Filled 2011-08-30 (×7): qty 1

## 2011-08-30 MED ORDER — POTASSIUM CHLORIDE 20 MEQ/15ML (10%) PO LIQD
40.0000 meq | Freq: Once | ORAL | Status: AC
  Administered 2011-08-30: 40 meq
  Filled 2011-08-30: qty 30

## 2011-08-30 NOTE — Progress Notes (Signed)
Subjective: 78 year old white female who presented 2/1 with an acute ST elevation inferior lateral myocardial infarction associated with cardiogenic shock. She had multiple episodes of ventricular fibrillation and complete heart block. She had stenting of the mid right coronary. She was transferred to the intensive care unit with temporary pacemaker and intra-aortic balloon pump in place. She has had multiple episodes of ventricular fibrillation since then with hemodynamic instability. ECG shows persistent marked ST elevation inferiorly.  In the ICU, patient arrested with VT and PEA on multiple occasions. Shocked x4. Epi/atropine given. Torsades noted and mag given. Patient resuscitated successfully and returned to cath lab with acute stent re-occlusion. Repeat stenting performed.  2/3 : No new events overnight. Remains on IABP 1:1. Levophed 5 mcg- titrating off. BP much better.  Intubated but wide awake. Denies CP.    Objective: Vital signs in last 24 hours: Filed Vitals:   08/30/11 0400 08/30/11 0439 08/30/11 0500 08/30/11 0600  BP: 128/44 128/44 127/38 130/48  Pulse: 67 80 66 69  Temp: 99.5 F (37.5 C)  99.5 F (37.5 C) 99.5 F (37.5 C)  TempSrc: Core (Comment)     Resp: 20 25 20 20   Height:      Weight:      SpO2: 100% 100% 100% 100%   Weight change:   Intake/Output Summary (Last 24 hours) at 08/30/11 4098 Last data filed at 08/30/11 0600  Gross per 24 hour  Intake 4573.79 ml  Output   1383 ml  Net 3190.79 ml   Physical Exam: General: Ventilated- but awake. Cardiac: S1S2, augmented by IABP   Pulm: mild crackles at bases, moving normal volumes of air (anteriorly) Abd: soft, nontender, nondistended, BS present  Ext: warm and well perfused, no pedal edema . R groin with ecchymosis. Soft. No bruit Neuro: Awake and Alert  Lab Results: Basic Metabolic Panel:  Lab 08/30/11 1191 08/29/11 0210  NA 136 136  K 3.5 4.4  CL 108 108  CO2 18* 18*  GLUCOSE 179* 276*  BUN 6 12    CREATININE 0.57 0.91  CALCIUM 7.4* 7.7*  MG 2.1 2.1  PHOS 2.2* 1.6*   Liver Function Tests:  Lab 08/28/11 1415  AST 26  ALT 27  ALKPHOS 38*  BILITOT 0.2*  PROT 5.6*  ALBUMIN 2.9*   CBC:  Lab 08/30/11 0240 08/29/11 0209  WBC 16.7* 17.5*  NEUTROABS -- --  HGB 9.7* 11.4*  HCT 27.9* 34.3*  MCV 82.1 84.7  PLT 231 317   Cardiac Enzymes:  Lab 08/30/11 0240 08/29/11 2000 08/28/11 1315  CKTOTAL 687* 838* 68  CKMB 23.3* 36.5* 3.6  CKMBINDEX -- -- --  TROPONINI 6.81* 8.91* <0.30   CBG:  Lab 08/29/11 1929 08/29/11 1540 08/29/11 1344 08/29/11 1254 08/29/11 1159 08/29/11 1106  GLUCAP 164* 175* 162* 184* 176* 166*   Hemoglobin A1C:  Lab 08/28/11 1415  HGBA1C 7.3*   Fasting Lipid Panel:  Lab 08/28/11 1415  CHOL 150  HDL 38*  LDLCALC 86  TRIG 478  CHOLHDL 3.9  LDLDIRECT --   Thyroid Function Tests:  Lab 08/28/11 1927  TSH 1.552  T4TOTAL --  FREET4 --  T3FREE --  THYROIDAB --   Coagulation:  Lab 08/29/11 0209 08/28/11 1415  LABPROT 15.7* 65.4*  INR 1.22 7.61*    Micro Results: Recent Results (from the past 240 hour(s))  MRSA PCR SCREENING     Status: Normal   Collection Time   08/29/11  1:58 AM      Component Value  Range Status Comment   MRSA by PCR NEGATIVE  NEGATIVE  Final    Studies/Results: Dg Chest Port 1 View  08/29/2011  *RADIOLOGY REPORT*  Clinical Data: Aortic balloon pump advancement  PORTABLE CHEST - 1 VIEW  Comparison: 08/29/2011  Findings: Aortic balloon pump tip is now in the proximal descending thoracic aorta after slight advancement.  Endotracheal tube 4.4 cm above the carina.  Basilar atelectasis/airspace disease persist with decreased lung volumes.  No effusion or pneumothorax.  Stable exam.  IMPRESSION: Stable chest exam.  Advancement of aortic balloon pump tip to the proximal descending thoracic aorta  Original Report Authenticated By: Judie Petit. Ruel Favors, M.D.   Dg Chest Port 1 View  08/29/2011  *RADIOLOGY REPORT*  Clinical Data: Aortic  balloon pump, ventilatory support  PORTABLE CHEST - 1 VIEW  Comparison: 08/29/2011  Findings: Radiopaque aortic balloon pump tip appears in the proximal descending thoracic aorta.  Endotracheal tube 4 cm above the carina.  NG tube coiled the stomach.  Femoral approach transvenous pacer wire noted as before.  Low lung volumes persist with basilar atelectasis/airspace disease.  No enlarging effusion or pneumothorax.  No change in aeration pattern.  IMPRESSION: Stable portable chest exam and support apparatus.  Original Report Authenticated By: Judie Petit. Ruel Favors, M.D.   Dg Chest Port 1 View  08/29/2011  *RADIOLOGY REPORT*  Clinical Data: Endotracheal tube position.  PORTABLE CHEST - 1 VIEW  Comparison: 08/28/2011.  Findings: Endotracheal tube is 20 mm from the carina.  Nasogastric tube is present with the tip in the proximal gastric fundus. Bilateral basilar atelectasis.  Intra-aortic balloon pump marker is present, 52 mm from the superior aortic arch.  Inferior approach transvenous pacing lead present, unchanged.  Low lung volumes with marked basilar atelectasis. There may be some interstitial edema.  IMPRESSION:  1.  Overall stable appearance of support apparatus aside from small positional change in the intra-aortic balloon pump. 2.  Low volume chest with prominent bilateral basilar atelectasis. 3.  Probable interstitial pulmonary edema.  Original Report Authenticated By: Andreas Newport, M.D.   Dg Chest Port 1 View  08/28/2011  *RADIOLOGY REPORT*  Clinical Data: Intra-aortic balloon pump placement  PORTABLE CHEST - 1 VIEW  Comparison: 08/28/2011  Findings: Endotracheal tube tip positioned 3.2 cm proximal to the carina.  NG tube descends into and coils over the gastric fundus. Intraaortic balloon pump again noted.  The marker is located 7.7 cm below the level of the top of the aortic arch.  Low lung volumes with interstitial and bibasilar opacities, similar to prior. Superimposed edema pattern is unchanged.  No  acute osseous abnormality.  IMPRESSION: Intraaortic balloon pump, with marker positioned 7.7 cm below the top of the aortic arch.  Unchanged endotracheal tube and nasogastric tube.  Bibasilar opacities and mild edema pattern.  Original Report Authenticated By: Waneta Martins, M.D.   Dg Chest Port 1 View  08/28/2011  *RADIOLOGY REPORT*  Clinical Data: Status post cardiac catheterization.  PORTABLE CHEST - 1 VIEW  Comparison: Chest 04/29/2007.  Findings: Endotracheal tube is in place with tip in good position just below the clavicular heads.  NG tube has its tip in the fundus of the stomach. Intra-aortic balloon pump is identified.  The marker is 8.3 cm below the top of the aortic arch.  Lung volumes are low with crowding of the bronchovascular structures. Indistinctness of the pulmonary vasculature is compatible with mild interstitial edema.  Heart size is normal.  IMPRESSION:  1.  Intra-aortic balloon pump is  positioned with the marker 8.3 cm below the top of the aortic arch. 2.  ET tube and NG tube in good position. 3.  Mild interstitial edema.  Original Report Authenticated By: Bernadene Bell. Maricela Curet, M.D.   Medications:  Scheduled Meds:    . antiseptic oral rinse  15 mL Mouth Rinse QID  . aspirin  325 mg Oral Daily  . chlorhexidine  15 mL Mouth Rinse BID  . clopidogrel  75 mg Oral Q breakfast  . hydrocortisone sodium succinate  100 mg Intravenous Daily  . hydrocortisone sodium succinate  50 mg Intravenous Q6H  . insulin aspart  0-4 Units Subcutaneous Q4H  . insulin glargine  24 Units Subcutaneous Daily  . levothyroxine  100 mcg Oral QAC breakfast  . pantoprazole (PROTONIX) IV  40 mg Intravenous Q12H  . potassium phosphate IVPB (mmol)  15 mmol Intravenous Once  . sodium chloride  500 mL Intravenous Once  . vancomycin  750 mg Intravenous Q12H   Continuous Infusions:    . sodium chloride 75 mL/hr at 08/30/11 0315  . sodium chloride 10 mL/hr at 08/29/11 2000  . sodium chloride 10 mL/hr at  08/30/11 0000  . amiodarone (NEXTERONE PREMIX) 360 mg/200 mL dextrose 30 mg/hr (08/30/11 0114)  . dextrose    . eptifibatide 2 mcg/kg/min (08/30/11 0026)  . fentaNYL infusion INTRAVENOUS 75 mcg/hr (08/30/11 0706)  . heparin 1,250 Units/hr (08/30/11 0515)  . midazolam (VERSED) infusion 2 mg/hr (08/30/11 0707)  . norepinephrine (LEVOPHED) Adult infusion 5 mcg/min (08/30/11 0707)  . DISCONTD: heparin 950 Units/hr (08/29/11 0718)  . DISCONTD: insulin (NOVOLIN-R) infusion Stopped (08/29/11 1350)   PRN Meds:.fentaNYL, midazolam, ondansetron (ZOFRAN) IV  Assessment/Plan: 1) STEMI: s/p RCA bare-metal stenting with temporary pacemaker & intra-aortic balloon pump day #2.   - Continue Integrilin, Plavix, heparin gtt and ASA  - 2-D echo to check ventricular function: still pending.   - Statin started  - Troponin trending down.  2) Cardiogenic shock: IABP.  - Pressor support with Levophed.  MAP is 70-80s -PCCM on board -CXR largely unchanged.  3) DM 2: HbA1c 7.3, adequately controlled.  - Sensitive sliding scale.   4) Hypothyroidism: TSH wnl - resume synthroid  5) Groin hematoma - stable  6) VDRF - p/ccm on baord - Will likely try to wean off once off Levophed.  7) Heart block - TVP in. Resolved  8) Leukocytosis - likely due to shock/STEMI. However given all the instrumentation she has had and remaining equipment in place will cover with Vancomycin for the short-term.   LOS: 2 days   PATEL,RAVI 08/30/2011, 7:12 AM  Patient seen and examined with Dr. Allena Katz. We discussed all aspects of the encounter. I agree with the assessment and plan as stated above. She is much improved. Remains ventilated and on IABP but now wide awake. Denies CP. Levophed off. Will begin IABP wean today.Hopefully we can get IABP out today and extubate this afternoon. I pulled TVP today. Can use low dose levophed for BP support as needed. Will continue integrelin for total 48 hours. Will get echo today. Continue ASA  and Plavix. Family updated.   Total Critical care time = 35 minutes.   Christiona Siddique,MD 9:47 AM

## 2011-08-30 NOTE — Progress Notes (Addendum)
20cc iv versed and 225cc iv fentanyl wasted in sink.  Witnessed by 2 RNs.  Dayna Barker, RN and Higinio Roger, RN.

## 2011-08-30 NOTE — Progress Notes (Signed)
ANTICOAGULATION CONSULT NOTE - Follow Up Consult  Pharmacy Consult for heparin/Integrelin Indication: chest pain/ACS  Labs:  Basename 08/30/11 1034 08/30/11 0240 08/29/11 2349 08/29/11 2000 08/29/11 1333 08/29/11 0210 08/29/11 0209 08/28/11 2231 08/28/11 1415  HGB -- 9.7* -- -- -- -- 11.4* -- --  HCT -- 27.9* -- -- -- -- 34.3* -- 33.2*  PLT -- 231 -- -- -- -- 317 -- 244  APTT -- -- -- -- -- -- 36 -- 123*  LABPROT -- -- -- -- -- -- 15.7* -- 65.4*  INR -- -- -- -- -- -- 1.22 -- 7.61*  HEPARINUNFRC 0.30 -- 0.31 -- 0.13* -- -- -- --  CREATININE -- 0.57 -- -- -- 0.91 -- 0.75 --  CKTOTAL 591* 687* -- 838* -- -- -- -- --  CKMB 13.4* 23.3* -- 36.5* -- -- -- -- --  TROPONINI 5.00* 6.81* -- 8.91* -- -- -- -- --   Assessment: 78yo female remains therapeutic on heparin though at very low end of goal.  Goal of Therapy:  Heparin level 0.3-0.5   Plan:  Heparin and Integrelin d/c'd prior to pulling balloon pump and not to be restarted per RN.  Pasty Spillers PharmD BCPS 08/30/2011,3:48 PM

## 2011-08-30 NOTE — Progress Notes (Signed)
Venous sheath d/c'd per md order. Manual pressure held for 15 minutes.  R Groin level 1. VSS throughout.  Pedal pulse dopplerable.  Pt tolerated well.  Post sheath removal instructions given.

## 2011-08-30 NOTE — Procedures (Signed)
Extubation Procedure Note  Patient Details:   Name: Alicia Medina DOB: 04/10/1933 MRN: [id number]   Airway Documentation:  Airway 8 mm (Active)  Secured at (cm) 24 cm 08/30/2011 11:35 AM  Measured From Lips 08/30/2011 11:35 AM  Secured Location Left 08/30/2011 11:35 AM  Secured By [employer] 08/30/2011 11:35 AM  Tube Holder Repositioned Yes 08/30/2011 11:35 AM  Cuff Pressure (cm H2O) 30 cm H2O 08/30/2011 11:35 AM  Site Condition Dry 08/30/2011 11:35 AM    Evaluation  O2 sats: stable throughout Complications: No apparent complications Patient did tolerate procedure well. Bilateral Breath Sounds: Diminished Suctioning: Airway Yes  Pt weaned on cpap/ps 5/5. Pt's cuff deflated and extubated to [facility]. Pt has diminished bilaterial breathsounds and no stridor present HR 102 RR-18 97% 108/55  Adolm Joseph 08/30/2011, 1:58 PM

## 2011-08-30 NOTE — Progress Notes (Signed)
eLink Physician-Brief Progress Note Patient Name: Alicia Medina DOB: 04/09/1933 MRN: [id number]  Date of Service  08/30/2011   HPI/Events of Note   Stopped theraputic anticoaug  eICU Interventions  Start SQH      Bray Vickerman 08/30/2011, 7:26 PM

## 2011-08-30 NOTE — Progress Notes (Signed)
Name: Alicia Medina MRN: [id number] DOB: 03/31/1933    LOS: 2  PCCM PROGRESS NOTE  History of Present Illness:  78 y/o WF with unknown medical history presented to [facility] ED as STEMI with nausea, vomiting, ST elevation in inferior leads.  She was transported emergently to cath lab and during prep was noted to have recurrent episodes of V-Fib requiring defibrillation and emergent intubation.  Further developed complete heart block requiring external pacing with cardiogenic shock.  LAD with 90% ostial lesion, dominant RCA occluded.  Bare metal stent placed and intra-aortic balloon pump due to hemodynamic instability.  Coded in ICU post cath and returned to cath lab.  Lines / Drains: OETT 2/1>>> R Fem Venous Sheath 2/1>>> L Fem Arterial Sheath 2/1>>>  Cultures: 2/2 mrsa PCR: neg  Antibiotics: 2/2  Vancomycin  Tests / Events:   Vital Signs: Temp:  [99.3 F (37.4 C)-99.9 F (37.7 C)] 99.5 F (37.5 C) (02/03 0900) Pulse Rate:  [65-84] 80  (02/03 0900) Resp:  [16-25] 21  (02/03 0900) BP: (98-150)/(31-55) 98/36 mmHg (02/03 0900) SpO2:  [99 %-100 %] 100 % (02/03 0900) FiO2 (%):  [40 %] 40 % (02/03 0900) Weight:  [86 kg (189 lb 9.5 oz)] 86 kg (189 lb 9.5 oz) (02/03 0730) I/O last 3 completed shifts: In: 7782.3 [I.V.:7027.3; NG/GT:100; IV Piggyback:655] Out: 2228 [Urine:2228]  Physical Examination: General: elderly female on vent Neuro: wakes up and tries to communicate CV: s1s2- augmented with IABP PULM: resp's non-labored, lungs with bilateral crackles GI: obese/soft, bsx4 active Extremities: cool/dry, trace edema bilateral LE   Ventilator settings: Vent Mode:  [-] PRVC FiO2 (%):  [40 %] 40 % Set Rate:  [20 bmp] 20 bmp Vt Set:  [500 mL] 500 mL PEEP:  [5 cmH20] 5 cmH20 Pressure Support:  [12 cmH20] 12 cmH20 Plateau Pressure:  [13 cmH20-18 cmH20] 15 cmH20  Labs    CBC  Lab 08/30/11 0240 08/29/11 0209 08/28/11 1415  HGB 9.7* 11.4* 11.1*  HCT 27.9* 34.3* 33.2*  WBC 16.7*  17.5* 16.0*  PLT 231 317 244   BMET  Lab 08/30/11 0240 08/29/11 0210 08/28/11 2231 08/28/11 1415 08/28/11 1412  NA 136 136 136 139 142  K 3.5 4.4 -- -- --  CL 108 108 106 106 107  CO2 18* 18* 18* 18* --  GLUCOSE 179* 276* 406* 294* 285*  BUN 6 12 13 15 13   CREATININE 0.57 0.91 0.75 0.63 0.70  CALCIUM 7.4* 7.7* 7.9* 7.6* --  MG 2.1 2.1 -- -- --  PHOS 2.2* 1.6* -- -- --    Lab 08/29/11 0209 08/28/11 1415  INR 1.22 7.61*    Lab 08/29/11 1030 08/29/11 0346 08/28/11 2324 08/28/11 1413 08/28/11 1412  PHART 7.416* 7.314* 7.210* 7.257* --  PCO2ART 27.1* 32.5* 42.9 44.1 --  PO2ART 153.0* 195.0* 194.0* 278.0* --  HCO3 17.4* 16.3* 17.1* 19.7* --  TCO2 18 17 18 21 20   O2SAT 99.0 100.0 99.0 100.0 --    Radiology: 2/2 CXR1. Overall stable appearance of support apparatus aside from smal, positional change in the intra-aortic balloon pump.2. Low volume chest with prominent bilateral basilar atelectasis.3. Probable interstitial pulmonary edema.   Assessment and Plan:  Encephalopathy secondary to sedation, No evidence of anoxia, following command. -Daily WUA -Continuous sedation protocol to be stopped after balloon pump is out since patient will be ready for extubation.  CAD with severe 2 vessel disease, STEMI, V-fib arrest - s/p cardiac cath with bare metal stent placement 2/1. Cardiogenic shock  on pressors / IABP.  Complete heart block.   -Rec's per Cardiology -Off pressors. -cont plavix, heparin and integralin per Cards -IABP per cards. -Patient will need diureses after IABP is out and BP is better controlled.  Acute Respiratory Failure secondary to cardiogenic shock  Lab 08/29/11 1030 08/29/11 0346 08/28/11 2324 08/28/11 1413 08/28/11 1412  PHART 7.416* 7.314* 7.210* 7.257* --  PCO2ART 27.1* 32.5* 42.9 44.1 --  PO2ART 153.0* 195.0* 194.0* 278.0* --  HCO3 17.4* 16.3* 17.1* 19.7* --  TCO2 18 17 18 21 20   O2SAT 99.0 100.0 99.0 100.0 --   -Patient is able to [id number] cc on  5/5, will be able to extubate fine once IABP is out.  Hyperglycemia secondary to stress response and steroids  Lab 08/30/11 0717 08/30/11 0407 08/30/11 0003 08/29/11 1929 08/29/11 1540  GLUCAP 152* 193* 160* 164* 175*   -SSI -Lantus  Hypophosphatemia - Replace  Hypothyroidism -Synthroid  Elevated WBC (?stress, ?steroids)  Lab 08/30/11 0240 08/29/11 0209 08/28/11 1415  WBC 16.7* 17.5* 16.0*   -Empirical Vancomycin given active IABP  Best practices / Disposition -Code Status:  Full Code -DVT Px: post cath, angiomax etc per cardiology -GI Px: protonix -NPO  The patient is critically ill with multiple organ systems failure and requires high complexity decision making for assessment and support, frequent evaluation and titration of therapies, application of advanced monitoring technologies and extensive interpretation of multiple databases. Critical Care Time devoted to patient care services described in this note is 35 minutes.  Alyson Reedy, M.D. [facility] Pulmonary/Critical Care Medicine. Pager: [phone]. After hours pager: [phone].

## 2011-08-30 NOTE — Progress Notes (Addendum)
IABP d/c'ed per MD order.  ACT 149. Manual pressure held for 30 minutes.  R Groin level 0.  VSS throughout.  Pt tolerated procedure well.  Will continue to monitor. R pedal pulses dopplerable.  Post sheath education given

## 2011-08-30 NOTE — Progress Notes (Signed)
ANTICOAGULATION CONSULT NOTE - Follow Up Consult  Pharmacy Consult for heparin Indication: chest pain/ACS  Labs:  Basename 08/29/11 2349 08/29/11 2000 08/29/11 1333 08/29/11 0210 08/29/11 0209 08/28/11 2231 08/28/11 1415 08/28/11 1412 08/28/11 1315  HGB -- -- -- -- 11.4* -- 11.1* -- --  HCT -- -- -- -- 34.3* -- 33.2* 35.0* --  PLT -- -- -- -- 317 -- 244 -- --  APTT -- -- -- -- 36 -- 123* -- --  LABPROT -- -- -- -- 15.7* -- 65.4* -- --  INR -- -- -- -- 1.22 -- 7.61* -- --  HEPARINUNFRC 0.31 -- 0.13* -- -- -- -- -- --  CREATININE -- -- -- 0.91 -- 0.75 0.63 -- --  CKTOTAL -- 838* -- -- -- -- -- -- 68  CKMB -- 36.5* -- -- -- -- -- -- 3.6  TROPONINI -- 8.91* -- -- -- -- -- -- <0.30   Assessment: 78yo female now therapeutic on heparin though at very low end of goal.  Goal of Therapy:  Heparin level 0.3-0.5   Plan:  Will increase heparin gtt slightly to 1250 units/hr and confirm with final CE.  Colleen Can PharmD BCPS 08/30/2011,1:29 AM

## 2011-08-30 NOTE — Progress Notes (Signed)
*  PRELIMINARY RESULTS* Echocardiogram 2D Echocardiogram has been performed.  Clide Deutscher 08/30/2011, 3:20 PM

## 2011-08-31 ENCOUNTER — Inpatient Hospital Stay (HOSPITAL_COMMUNITY): Payer: Medicare Other

## 2011-08-31 DIAGNOSIS — I214 Non-ST elevation (NSTEMI) myocardial infarction: Secondary | ICD-10-CM

## 2011-08-31 DIAGNOSIS — R570 Cardiogenic shock: Secondary | ICD-10-CM

## 2011-08-31 LAB — GLUCOSE, CAPILLARY
Glucose-Capillary: 129 mg/dL — ABNORMAL HIGH (ref 70–99)
Glucose-Capillary: 142 mg/dL — ABNORMAL HIGH (ref 70–99)
Glucose-Capillary: 205 mg/dL — ABNORMAL HIGH (ref 70–99)
Glucose-Capillary: 112 mg/dL — ABNORMAL HIGH (ref 70–99)
Glucose-Capillary: 186 mg/dL — ABNORMAL HIGH (ref 70–99)

## 2011-08-31 LAB — CBC
MCH: 28.6 pg (ref 26.0–34.0)
MCH: 28.6 pg (ref 26.0–34.0)
MCV: 84.5 fL (ref 78.0–100.0)
MCV: 83.5 fL (ref 78.0–100.0)
Platelets: 181 10*3/uL (ref 150–400)
Platelets: 201 10*3/uL (ref 150–400)
RBC: 3.46 MIL/uL — ABNORMAL LOW (ref 3.87–5.11)
RDW: 16.7 % — ABNORMAL HIGH (ref 11.5–15.5)
RDW: 16.2 % — ABNORMAL HIGH (ref 11.5–15.5)

## 2011-08-31 LAB — BASIC METABOLIC PANEL
CO2: 23 mEq/L (ref 19–32)
Calcium: 7.6 mg/dL — ABNORMAL LOW (ref 8.4–10.5)
Creatinine, Ser: 0.59 mg/dL (ref 0.50–1.10)

## 2011-08-31 LAB — PROTIME-INR: Prothrombin Time: 14.4 seconds (ref 11.6–15.2)

## 2011-08-31 LAB — CARDIAC PANEL(CRET KIN+CKTOT+MB+TROPI): CK, MB: 7.4 ng/mL (ref 0.3–4.0)

## 2011-08-31 LAB — MAGNESIUM: Magnesium: 2.1 mg/dL (ref 1.5–2.5)

## 2011-08-31 MED ORDER — FUROSEMIDE 10 MG/ML IJ SOLN
40.0000 mg | Freq: Two times a day (BID) | INTRAMUSCULAR | Status: DC
  Administered 2011-08-31: 40 mg via INTRAVENOUS
  Filled 2011-08-31 (×3): qty 4

## 2011-08-31 MED ORDER — NITROGLYCERIN IN D5W 200-5 MCG/ML-% IV SOLN
2.0000 ug/min | INTRAVENOUS | Status: DC
  Administered 2011-08-31: 30 ug/min via INTRAVENOUS

## 2011-08-31 MED ORDER — FUROSEMIDE 10 MG/ML IJ SOLN
80.0000 mg | Freq: Once | INTRAMUSCULAR | Status: DC
  Filled 2011-08-31: qty 8

## 2011-08-31 MED ORDER — FUROSEMIDE 10 MG/ML IJ SOLN
40.0000 mg | INTRAMUSCULAR | Status: AC
  Administered 2011-08-31: 40 mg via INTRAVENOUS
  Filled 2011-08-31: qty 4

## 2011-08-31 MED ORDER — NITROGLYCERIN IN D5W 200-5 MCG/ML-% IV SOLN
INTRAVENOUS | Status: AC
  Administered 2011-08-31: 10 ug
  Filled 2011-08-31: qty 250

## 2011-08-31 MED ORDER — SODIUM CHLORIDE 0.9 % IV SOLN
INTRAVENOUS | Status: DC
  Administered 2011-08-31: 15:00:00 via INTRAVENOUS

## 2011-08-31 MED ORDER — POTASSIUM CHLORIDE CRYS ER 20 MEQ PO TBCR
40.0000 meq | EXTENDED_RELEASE_TABLET | Freq: Two times a day (BID) | ORAL | Status: DC
  Administered 2011-08-31 – 2011-09-02 (×5): 40 meq via ORAL
  Filled 2011-08-31 (×9): qty 2

## 2011-08-31 MED ORDER — FUROSEMIDE 10 MG/ML IJ SOLN
40.0000 mg | Freq: Once | INTRAMUSCULAR | Status: DC
  Administered 2011-08-31: 40 mg via INTRAVENOUS
  Filled 2011-08-31: qty 4

## 2011-08-31 MED ORDER — SODIUM PHOSPHATE 3 MMOLE/ML IV SOLN
30.0000 mmol | Freq: Once | INTRAVENOUS | Status: AC
  Administered 2011-08-31: 30 mmol via INTRAVENOUS
  Filled 2011-08-31: qty 10

## 2011-08-31 NOTE — Progress Notes (Signed)
eLink Physician-Brief Progress Note Patient Name: Alicia Medina DOB: 07-31-1932 MRN: [id number]  Date of Service  08/31/2011   HPI/Events of Note   Low phos  eICU Interventions  Phos replaced   Intervention Category Intermediate Interventions: Electrolyte abnormality - evaluation and management  DETERDING,ELIZABETH 08/31/2011, 6:23 AM

## 2011-08-31 NOTE — Progress Notes (Signed)
Name: Alicia Medina MRN: [id number] DOB: 12/10/1932    LOS: 3  PCCM PROGRESS NOTE  History of Present Illness:  78 y/o WF with unknown medical history presented to [facility] ED as STEMI with nausea, vomiting, ST elevation in inferior leads.  She was transported emergently to cath lab and during prep was noted to have recurrent episodes of V-Fib requiring defibrillation and emergent intubation.  Further developed complete heart block requiring external pacing with cardiogenic shock.  LAD with 90% ostial lesion, dominant RCA occluded.  Bare metal stent placed and intra-aortic balloon pump due to hemodynamic instability.  Coded in ICU post cath and returned to cath lab.  Lines / Drains: OETT 2/1>>>2/3 R Fem Venous Sheath 2/1>>>2/3 L Fem Arterial Sheath 2/1>>>2/3  Cultures: 2/2 mrsa PCR: neg  Antibiotics: 2/2  Vancomycin  Tests / Events: 2/4: more work of breathing, dyspnea this morning  Vital Signs: Temp:  [94.8 F (34.9 C)-100.2 F (37.9 C)] 99 F (37.2 C) (02/04 0500) Pulse Rate:  [78-107] 98  (02/04 0800) Resp:  [13-24] 24  (02/04 0800) BP: (94-165)/(43-85) 165/71 mmHg (02/04 0800) SpO2:  [81 %-100 %] 90 % (02/04 0800) FiO2 (%):  [35 %-50 %] 35 % (02/04 0700) Weight:  [88.3 kg (194 lb 10.7 oz)] 88.3 kg (194 lb 10.7 oz) (02/04 0500) I/O last 3 completed shifts: In: 4955.5 [P.O.:240; I.V.:3955.5; NG/GT:60; IV Piggyback:700] Out: 1715 [Urine:1715]  Physical Examination: Gen: acute resp distress HEENT: NCAT, PERRL, EOMi, PULM: Insp crackles and rhonchi throughout bilaterally CV: Tachy, regular s1/s2, no clear gallop appreciated AB: BS+, soft, nontender, no hsm Ext: edema noted, warm Neuro: Awake and alert, answering questions clearly and following commands, maew   Ventilator settings: Vent Mode:  [-] CPAP;PSV FiO2 (%):  [35 %-50 %] 35 % Set Rate:  [20 bmp] 20 bmp Vt Set:  [500 mL] 500 mL PEEP:  [5 cmH20] 5 cmH20 Pressure Support:  [5 cmH20] 5 cmH20 Plateau Pressure:  [16  cmH20] 16 cmH20  Labs    CBC  Lab 08/31/11 0504 08/30/11 0240 08/29/11 0209  HGB 8.7* 9.7* 11.4*  HCT 25.7* 27.9* 34.3*  WBC 12.1* 16.7* 17.5*  PLT 181 231 317   BMET  Lab 08/31/11 0504 08/30/11 0240 08/29/11 0210 08/28/11 2231 08/28/11 1415  NA 137 136 136 136 139  K 3.6 3.5 -- -- --  CL 109 108 108 106 106  CO2 23 18* 18* 18* 18*  GLUCOSE 129* 179* 276* 406* 294*  BUN 8 6 12 13 15   CREATININE 0.59 0.57 0.91 0.75 0.63  CALCIUM 7.6* 7.4* 7.7* 7.9* 7.6*  MG 2.1 2.1 2.1 -- --  PHOS 1.7* 2.2* 1.6* -- --    Lab 08/29/11 0209 08/28/11 1415  INR 1.22 7.61*    Lab 08/29/11 1030 08/29/11 0346 08/28/11 2324 08/28/11 1413 08/28/11 1412  PHART 7.416* 7.314* 7.210* 7.257* --  PCO2ART 27.1* 32.5* 42.9 44.1 --  PO2ART 153.0* 195.0* 194.0* 278.0* --  HCO3 17.4* 16.3* 17.1* 19.7* --  TCO2 18 17 18 21 20   O2SAT 99.0 100.0 99.0 100.0 --    Radiology: 2/4 CXR1. Bilateral pulm edema, much worse this morning  Assessment and Plan:  Encephalopathy secondary to sedation, No evidence of anoxia, following command. -extubated, doing well neurologically today considering code over weekend  CAD with severe 2 vessel disease, STEMI, V-fib arrest - s/p cardiac cath with bare metal stent placement 2/1. Cardiogenic shock on pressors / IABP.  Complete heart block.  Acute pulmonary edema and hypertension  morning of 2/4.   -Rec's per Cardiology -Off pressors. -cont plavix, heparin and integralin per Cards -IABP out 2/4. -diuresis and nitroglycerine this morning per cardiology.  Acute Respiratory Failure secondary to cardiogenic shock  Lab 08/29/11 1030 08/29/11 0346 08/28/11 2324 08/28/11 1413 08/28/11 1412  PHART 7.416* 7.314* 7.210* 7.257* --  PCO2ART 27.1* 32.5* 42.9 44.1 --  PO2ART 153.0* 195.0* 194.0* 278.0* --  HCO3 17.4* 16.3* 17.1* 19.7* --  TCO2 18 17 18 21 20   O2SAT 99.0 100.0 99.0 100.0 --   -extubated 2/3 -2/4: acute pulm edema and resp distress -bipap now -diuresis now and  ntg gtt per cardiology  Hyperglycemia secondary to stress response and steroids  Lab 08/31/11 0826 08/31/11 0400 08/30/11 2330 08/30/11 2003 08/30/11 1543  GLUCAP 144* 129* 142* 144* 109*   -SSI -Lantus  Hypophosphatemia - Replace  Hypothyroidism -Synthroid  Elevated WBC (?stress, ?steroids); no evidence of infection  Lab 08/31/11 0504 08/30/11 0240 08/29/11 0209 08/28/11 1415  WBC 12.1* 16.7* 17.5* 16.0*   -Empirical Vancomycin for IABP d/c'd 2/3  Best practices / Disposition -Code Status:  Full Code -DVT Px: post cath, angiomax etc per cardiology -GI Px: protonix -NPO  The patient is critically ill with multiple organ systems failure and requires high complexity decision making for assessment and support, frequent evaluation and titration of therapies, application of advanced monitoring technologies and extensive interpretation of multiple databases. Critical Care Time devoted to patient care services described in this note is 35 minutes.  Yolonda Kida Pulmonary/Critical Care Medicine. Pager: [phone] After hours pager: [phone].

## 2011-08-31 NOTE — Progress Notes (Signed)
Much improved, as is exam.  Now rales in bases only. Neg negative around 4L.  K is being replaced. BMET in am.   Spoke with family.    Shawnie Pons. 7:05 PM 08/31/2011

## 2011-08-31 NOTE — Clinical Documentation Improvement (Signed)
Anemia Blood Loss Clarification  THIS DOCUMENT IS NOT A PERMANENT PART OF THE MEDICAL RECORD  RESPOND TO THE THIS QUERY, FOLLOW THE INSTRUCTIONS BELOW:  1. If needed, update documentation for the patient's encounter via the notes activity.  2. Access this query again and click edit on the In [employer].  3. After updating, or not, click F2 to complete all highlighted (required) fields concerning your review. Select "additional documentation in the medical record" OR "no additional documentation provided".  4. Click Sign note button.  5. The deficiency will fall out of your In Basket *Please let us know if you are not able to complete this workflow by phone or e-mail (listed below). Please update documentation in a progress note and/or discharge summary as appropriate. Thanks.         08/31/11  Dear Dr.Stuckey Marton Redwood  In an effort to better capture your patient's severity of illness, reflect appropriate length of stay and utilization of resources, a review of the patient medical record has revealed the following indicators. Based on your clinical judgment, please clarify and document in a progress note and/or discharge summary the clinical condition associated with the following supporting information:In responding to this query please exercise your independent judgment.  The fact that a query is asked, does not imply that any particular answer is desired or expected.  Possible Clinical Conditions?   " Expected Acute Blood Loss Anemia  " Acute Blood Loss Anemia  " Precipitous drop in Hematocrit  " Other Condition________________  " Cannot Clinically Determine  Supporting Information: Risk factors: recent procedure (cath) with hematoma  Diagnostics: H&H on admit:2/1 12.6/37.0  Current H&H: 2/4 8.7/25.7  Treatments:"Hgb down to 8.7, so may need transfusion if less than 8.0 grams".  IV fluids: NSS   Serial H&H monitoring  Reviewed: additional documentation in the  medical record [facility]  Thank You,  Amada Kingfisher RN, BSN, CCM   Clinical Documentation Specialist: [phone] [email or web address]  Health Information Management [place]

## 2011-08-31 NOTE — Progress Notes (Signed)
Subjective: 78 year old white female who presented 2/1 with an acute ST elevation inferior lateral myocardial infarction associated with cardiogenic shock. She had multiple episodes of ventricular fibrillation and complete heart block. She had stenting of the mid right coronary. She was transferred to the intensive care unit with temporary pacemaker and intra-aortic balloon pump in place. She has had multiple episodes of ventricular fibrillation since then with hemodynamic instability. ECG shows persistent marked ST elevation inferiorly.  In the ICU, patient arrested with VT and PEA on multiple occasions. Shocked x4. Epi/atropine given. Torsades noted and mag given. Patient resuscitated successfully and returned to cath lab with acute stent re-occlusion. Repeat stenting performed.  2/3 : No new events overnight. Remains on IABP 1:1. Levophed 5 mcg- titrating off. BP much better.  Intubated but wide awake. Denies CP.   2/4: IABP removed and Extubated 2/3. Pt doing well until midnight when sats dropped to 80's. Mild SOB. No CP, N/V. A&Ox3 and pt feeling overall better.   Objective: Vital signs in last 24 hours: Filed Vitals:   08/31/11 0600 08/31/11 0615 08/31/11 0630 08/31/11 0700  BP: 150/71   158/75  Pulse: 89 103 88 90  Temp:      TempSrc:      Resp: 20 23 20 21   Height:      Weight:      SpO2: 86% 81% 91% 94%   Weight change:   Intake/Output Summary (Last 24 hours) at 08/31/11 0743 Last data filed at 08/31/11 0600  Gross per 24 hour  Intake 2585.6 ml  Output    910 ml  Net 1675.6 ml   Physical Exam: General: Alert- on Venturi mask. Cardiac: S1S2, RRR, no murmur. Pulm: mild diffuse crackles at bases, moving normal volumes of air. No wheezes. Abd: soft, nontender, nondistended, BS present  Ext: warm and well perfused, no pedal edema . R groin with ecchymosis. Soft. No bruit Neuro: A&Ox3, non focal.  Lab Results: Basic Metabolic Panel:  Lab 08/31/11 1610 08/30/11 0240   NA 137 136  K 3.6 3.5  CL 109 108  CO2 23 18*  GLUCOSE 129* 179*  BUN 8 6  CREATININE 0.59 0.57  CALCIUM 7.6* 7.4*  MG 2.1 2.1  PHOS 1.7* 2.2*   Liver Function Tests:  Lab 08/28/11 1415  AST 26  ALT 27  ALKPHOS 38*  BILITOT 0.2*  PROT 5.6*  ALBUMIN 2.9*   CBC:  Lab 08/31/11 0504 08/30/11 0240  WBC 12.1* 16.7*  NEUTROABS -- --  HGB 8.7* 9.7*  HCT 25.7* 27.9*  MCV 84.5 82.1  PLT 181 231   Cardiac Enzymes:  Lab 08/30/11 1034 08/30/11 0240 08/29/11 2000  CKTOTAL 591* 687* 838*  CKMB 13.4* 23.3* 36.5*  CKMBINDEX -- -- --  TROPONINI 5.00* 6.81* 8.91*   CBG:  Lab 08/30/11 2003 08/30/11 1543 08/30/11 1127 08/30/11 0717 08/30/11 0407 08/30/11 0003  GLUCAP 144* 109* 148* 152* 193* 160*   Hemoglobin A1C:  Lab 08/28/11 1415  HGBA1C 7.3*   Fasting Lipid Panel:  Lab 08/28/11 1415  CHOL 150  HDL 38*  LDLCALC 86  TRIG 960  CHOLHDL 3.9  LDLDIRECT --   Thyroid Function Tests:  Lab 08/28/11 1927  TSH 1.552  T4TOTAL --  FREET4 --  T3FREE --  THYROIDAB --   Coagulation:  Lab 08/29/11 0209 08/28/11 1415  LABPROT 15.7* 65.4*  INR 1.22 7.61*    Micro Results: Recent Results (from the past 240 hour(s))  MRSA PCR SCREENING  Status: Normal   Collection Time   08/29/11  1:58 AM      Component Value Range Status Comment   MRSA by PCR NEGATIVE  NEGATIVE  Final    Studies/Results: Dg Chest Port 1 View  08/30/2011  *RADIOLOGY REPORT*  Clinical Data: Endotracheal tube placement  PORTABLE CHEST - 1 VIEW  Comparison: 08/29/2011  Findings: Endotracheal and nasogastric tubes appropriately positioned.  Intra-aortic balloon pump tip terminates over the distal aortic arch.  Bilateral lower lobe curvilinear atelectasis and overall hypoaeration are stable.  Cardiomediastinal silhouette is within normal limits.  Left-sided chest tube/drain remains in place.  IMPRESSION: No significant change as above.  Original Report Authenticated By: Harrel Lemon, M.D.   Dg Chest  Port 1 View  08/29/2011  *RADIOLOGY REPORT*  Clinical Data: Aortic balloon pump advancement  PORTABLE CHEST - 1 VIEW  Comparison: 08/29/2011  Findings: Aortic balloon pump tip is now in the proximal descending thoracic aorta after slight advancement.  Endotracheal tube 4.4 cm above the carina.  Basilar atelectasis/airspace disease persist with decreased lung volumes.  No effusion or pneumothorax.  Stable exam.  IMPRESSION: Stable chest exam.  Advancement of aortic balloon pump tip to the proximal descending thoracic aorta  Original Report Authenticated By: Judie Petit. Ruel Favors, M.D.   Dg Chest Port 1 View  08/29/2011  *RADIOLOGY REPORT*  Clinical Data: Aortic balloon pump, ventilatory support  PORTABLE CHEST - 1 VIEW  Comparison: 08/29/2011  Findings: Radiopaque aortic balloon pump tip appears in the proximal descending thoracic aorta.  Endotracheal tube 4 cm above the carina.  NG tube coiled the stomach.  Femoral approach transvenous pacer wire noted as before.  Low lung volumes persist with basilar atelectasis/airspace disease.  No enlarging effusion or pneumothorax.  No change in aeration pattern.  IMPRESSION: Stable portable chest exam and support apparatus.  Original Report Authenticated By: Judie Petit. Ruel Favors, M.D.   Medications:  Scheduled Meds:    . antiseptic oral rinse  15 mL Mouth Rinse QID  . aspirin  325 mg Oral Daily  . chlorhexidine  15 mL Mouth Rinse BID  . clopidogrel  75 mg Oral Q breakfast  . digoxin  0.125 mg Oral Daily  . furosemide  40 mg Intravenous Once  . heparin subcutaneous  5,000 Units Subcutaneous Q8H  . hydrocortisone sodium succinate  50 mg Intravenous Q6H  . insulin aspart  0-4 Units Subcutaneous Q4H  . insulin glargine  24 Units Subcutaneous Daily  . levothyroxine  100 mcg Oral QAC breakfast  . metoprolol tartrate  12.5 mg Oral BID  . pantoprazole (PROTONIX) IV  40 mg Intravenous Q12H  . potassium chloride  40 mEq Per Tube Once  . rosuvastatin  40 mg Oral Daily  .  sodium phosphate  Dextrose 5% IVPB  30 mmol Intravenous Once  . vancomycin  750 mg Intravenous Q12H  . DISCONTD: potassium chloride  40 mEq Oral Once   Continuous Infusions:    . sodium chloride 75 mL/hr at 08/30/11 2226  . dextrose    . norepinephrine (LEVOPHED) Adult infusion Stopped (08/30/11 0810)  . DISCONTD: sodium chloride Stopped (08/30/11 1300)  . DISCONTD: sodium chloride Stopped (08/30/11 1627)  . DISCONTD: amiodarone (NEXTERONE PREMIX) 360 mg/200 mL dextrose Stopped (08/30/11 1505)  . DISCONTD: eptifibatide Stopped (08/30/11 1220)  . DISCONTD: fentaNYL infusion INTRAVENOUS Stopped (08/30/11 1335)  . DISCONTD: heparin Stopped (08/30/11 1220)  . DISCONTD: midazolam (VERSED) infusion Stopped (08/30/11 1335)   PRN Meds:.ondansetron (ZOFRAN) IV, DISCONTD: fentaNYL, DISCONTD: metoprolol,  DISCONTD: midazolam  Assessment/Plan: 1) STEMI: s/p RCA bare-metal stenting, Day #3.   - Continue Plavix,t and ASA -  D/C'd Integrilin, heparin gtt 2/3.  - 2-D echo to check ventricular function: still pending!!  - Statin started  - Troponin trending down.  2) Acute Respiratory distress: Most likely secondary to fluid overload and pulmonary edema. - 7 L positive since admission. - P. CXR shows pulmonary edema - EF unknown- 2-D echo still pending. - Give IV Lasix 40 mg 1 and titrate oxygen support with Ventimask. Additional diuretic doses as needed titrating with clinical improvement.  2) Cardiogenic shock: Secondary to STEMI . Resolved. -  IABP removed yesterday-2/3. - Levophed titrated off .  - Blood pressure stable to mildly elevated.   3) DM 2: HbA1c 7.3, adequately controlled.  - Sensitive sliding scale.   4) Hypothyroidism: TSH wnl - resume synthroid- 100 mcg daily.  5) Groin hematoma - stable- femoral sheath removed yesterday.  6) VDRF -patient extubated, 2/3.  7) Heart block - due to inferior STEMI. Resolved. TVP removed- 2/3.   8) Leukocytosis - Improving- WBC 12.1  today. Likely due to shock/STEMI. However given all the instrumentation she has had and remaining equipment in place will cover with Vancomycin for the short-term.   LOS: 3 days   PATEL,RAVI 08/31/2011, 7:43 AM  Patient seen and examined.  Clearly has recurrent pulmonary edema secondary to volume overload.  Echo reviewed with overall LV function not that bad.  Diffuse rales on exam, and CXR consistent.  BP elevated, and HR up to 130.  Placed on BIPAP and IV furosemide given.  NTG drip started.  Will need to check P2Y12, although should be noted that clopidogrel could not be given prior to stent thrombosis secondary to presentation.   Hgb down to 8.7, so may need transfusion if less than 8.0 grams.   Will recheck ECG and get P2 Y12 now to assess response.  Will also decrease ASA to 81 mg tomorrow.

## 2011-08-31 NOTE — Progress Notes (Signed)
Following pt. Noted hypoxia this am/today. RN sts she is going to apply ventimask soon. Will f/u am for ambulation unless o/w specified today.  Ethelda Chick CES, ACSM

## 2011-08-31 NOTE — Progress Notes (Signed)
Pt is currently on NRB with SpO2 100% RR mid 20s with no distress. Pt states she feels about the same since she has taken mask off. Plan to place pt back on Bipap in a couple of hours for the remainder of the evening. Pt agrees to this and is aware if she gets into distress we can place on earlier. RN aware.

## 2011-08-31 NOTE — Progress Notes (Signed)
UR Completed. Simmons, Julie Nay F [phone]  

## 2011-09-01 ENCOUNTER — Inpatient Hospital Stay (HOSPITAL_COMMUNITY): Payer: Medicare Other

## 2011-09-01 DIAGNOSIS — R570 Cardiogenic shock: Secondary | ICD-10-CM

## 2011-09-01 DIAGNOSIS — R4182 Altered mental status, unspecified: Secondary | ICD-10-CM

## 2011-09-01 DIAGNOSIS — J9600 Acute respiratory failure, unspecified whether with hypoxia or hypercapnia: Secondary | ICD-10-CM

## 2011-09-01 DIAGNOSIS — I469 Cardiac arrest, cause unspecified: Secondary | ICD-10-CM

## 2011-09-01 LAB — BASIC METABOLIC PANEL
BUN: 12 mg/dL (ref 6–23)
CO2: 30 mEq/L (ref 19–32)
Calcium: 7.9 mg/dL — ABNORMAL LOW (ref 8.4–10.5)
Creatinine, Ser: 0.61 mg/dL (ref 0.50–1.10)

## 2011-09-01 LAB — GLUCOSE, CAPILLARY
Glucose-Capillary: 124 mg/dL — ABNORMAL HIGH (ref 70–99)
Glucose-Capillary: 107 mg/dL — ABNORMAL HIGH (ref 70–99)
Glucose-Capillary: 118 mg/dL — ABNORMAL HIGH (ref 70–99)

## 2011-09-01 LAB — CARDIAC PANEL(CRET KIN+CKTOT+MB+TROPI)
CK, MB: 3.7 ng/mL (ref 0.3–4.0)
Total CK: 340 U/L — ABNORMAL HIGH (ref 7–177)

## 2011-09-01 LAB — CBC
HCT: 25.8 % — ABNORMAL LOW (ref 36.0–46.0)
Hemoglobin: 8.8 g/dL — ABNORMAL LOW (ref 12.0–15.0)
MCH: 28.5 pg (ref 26.0–34.0)
MCV: 83.5 fL (ref 78.0–100.0)
Platelets: 196 10*3/uL (ref 150–400)
RBC: 3.09 MIL/uL — ABNORMAL LOW (ref 3.87–5.11)

## 2011-09-01 MED ORDER — PIPERACILLIN-TAZOBACTAM 3.375 G IVPB
3.3750 g | Freq: Three times a day (TID) | INTRAVENOUS | Status: DC
  Administered 2011-09-01 (×2): 3.375 g via INTRAVENOUS
  Filled 2011-09-01 (×5): qty 50

## 2011-09-01 MED ORDER — INSULIN ASPART 100 UNIT/ML ~~LOC~~ SOLN
0.0000 [IU] | Freq: Three times a day (TID) | SUBCUTANEOUS | Status: DC
  Administered 2011-09-01 – 2011-09-02 (×3): 1 [IU] via SUBCUTANEOUS
  Administered 2011-09-02: 3 [IU] via SUBCUTANEOUS
  Administered 2011-09-03: 1 [IU] via SUBCUTANEOUS
  Administered 2011-09-03: 3 [IU] via SUBCUTANEOUS
  Administered 2011-09-03: 1 [IU] via SUBCUTANEOUS
  Administered 2011-09-03: 3 [IU] via SUBCUTANEOUS
  Administered 2011-09-04: 1 [IU] via SUBCUTANEOUS

## 2011-09-01 MED ORDER — METOPROLOL TARTRATE 25 MG PO TABS
25.0000 mg | ORAL_TABLET | Freq: Two times a day (BID) | ORAL | Status: DC
  Administered 2011-09-01 – 2011-09-03 (×6): 25 mg via ORAL
  Filled 2011-09-01 (×7): qty 1

## 2011-09-01 MED ORDER — FUROSEMIDE 10 MG/ML IJ SOLN
80.0000 mg | Freq: Once | INTRAMUSCULAR | Status: AC
  Administered 2011-09-01: 80 mg via INTRAVENOUS
  Filled 2011-09-01: qty 8

## 2011-09-01 MED ORDER — POTASSIUM CHLORIDE CRYS ER 20 MEQ PO TBCR
40.0000 meq | EXTENDED_RELEASE_TABLET | Freq: Once | ORAL | Status: AC
  Administered 2011-09-01: 40 meq via ORAL

## 2011-09-01 MED ORDER — POTASSIUM CHLORIDE CRYS ER 20 MEQ PO TBCR
40.0000 meq | EXTENDED_RELEASE_TABLET | Freq: Once | ORAL | Status: AC
  Administered 2011-09-01: 40 meq via ORAL
  Filled 2011-09-01: qty 1

## 2011-09-01 MED ORDER — PANTOPRAZOLE SODIUM 40 MG PO TBEC
40.0000 mg | DELAYED_RELEASE_TABLET | Freq: Two times a day (BID) | ORAL | Status: DC
  Administered 2011-09-01 – 2011-09-05 (×8): 40 mg via ORAL
  Filled 2011-09-01 (×8): qty 1

## 2011-09-01 MED ORDER — ASPIRIN 81 MG PO CHEW
81.0000 mg | CHEWABLE_TABLET | Freq: Every day | ORAL | Status: DC
  Administered 2011-09-01 – 2011-09-05 (×5): 81 mg via ORAL
  Filled 2011-09-01 (×5): qty 1

## 2011-09-01 MED ORDER — POTASSIUM CHLORIDE CRYS ER 20 MEQ PO TBCR
40.0000 meq | EXTENDED_RELEASE_TABLET | Freq: Once | ORAL | Status: AC
  Administered 2011-09-01: 40 meq via ORAL
  Filled 2011-09-01: qty 2

## 2011-09-01 NOTE — Progress Notes (Signed)
ANTIBIOTIC CONSULT NOTE - INITIAL  Pharmacy Consult for Zosyn Indication: Rule out PNA  No Known Allergies  Patient Measurements: Height: 5\' 7"  (170.2 cm) Weight: 194 lb 10.7 oz (88.3 kg) IBW/kg (Calculated) : 61.6  Adjusted Body Weight:   Vital Signs: Temp: 97.9 F (36.6 C) (02/05 0810) Temp src: Oral (02/05 0438) BP: 128/54 mmHg (02/05 1000) Pulse Rate: 94  (02/05 1000) Intake/Output from previous day: 02/04 0701 - 02/05 0700 In: 967 [P.O.:300; I.V.:355; IV Piggyback:312] Out: 6350 [Urine:6350] Intake/Output from this shift: Total I/O In: 560 [P.O.:518; I.V.:42] Out: 200 [Urine:200]  Labs:  [facility] 09/01/11 0549 08/31/11 1205 08/31/11 0504 08/30/11 0240  WBC 13.1* 17.2* 12.1* --  HGB 8.8* 9.9* 8.7* --  PLT 196 201 181 --  LABCREA -- -- -- --  CREATININE 0.61 -- 0.59 0.57   Estimated Creatinine Clearance: 68.3 ml/min (by C-G formula based on Cr of 0.61). No results found for this basename: VANCOTROUGH:2,VANCOPEAK:2,VANCORANDOM:2,GENTTROUGH:2,GENTPEAK:2,GENTRANDOM:2,TOBRATROUGH:2,TOBRAPEAK:2,TOBRARND:2,AMIKACINPEAK:2,AMIKACINTROU:2,AMIKACIN:2, in the last 72 hours   Microbiology: Recent Results (from the past 720 hour(s))  MRSA PCR SCREENING     Status: Normal   Collection Time   08/29/11  1:58 AM      Component Value Range Status Comment   MRSA by PCR NEGATIVE  NEGATIVE  Final     Medical History: Past Medical History  Diagnosis Date  . Hypothyroidism, postsurgical   . Hypertension   . Hyperlipidemia   . Diabetes mellitus     Medications:  Anti-infectives     Start     Dose/Rate Route Frequency Ordered Stop   08/29/11 1030   vancomycin (VANCOCIN) 750 mg in sodium chloride 0.9 % 150 mL IVPB  Status:  Discontinued        750 mg 150 mL/hr over 60 Minutes Intravenous Every 12 hours 08/29/11 1030 09/01/11 1023         Assessment: 46 yoF admitted 02/01 with STEMI and subsequent F-fib arrest, s/p BMS placement and temporary pacer and IABP placement.  Pt  now has RUL infiltrate, cough, and leukocytosis.    Pharmacy asked to manage zosyn therapy.  Md notes likely short-course antibiotics (5days).  WBC 17.2-->13.1.  Afebrile.  SCr 0.61, CrCl 68 ml/min.  Pt received 3 days of Vancomycin 02/02-2/4 for empiric treatment of leukocytosis/instrumentation placement.    Plan:  1.  Zosyn 3.375g IV q 8 hours (4 hour infusion). 2.  F/u clinical course, chest xray, T, WBC, renal fxn, cultures as appropriate.   Mahesh Sizemore E 09/01/2011,10:32 AM

## 2011-09-01 NOTE — Progress Notes (Signed)
Pt does not wish to wear Bipap. Pt has been on [place] all day with no complications. No distress noted. Pt will place on Bipap if pt gets into distress.

## 2011-09-01 NOTE — Progress Notes (Addendum)
Subjective: 78 year old white female who presented 2/1 with an acute ST elevation inferior lateral myocardial infarction associated with cardiogenic shock. She had multiple episodes of ventricular fibrillation and complete heart block. She had stenting of the mid right coronary. She was transferred to the intensive care unit with temporary pacemaker and intra-aortic balloon pump in place. She has had multiple episodes of ventricular fibrillation since then with hemodynamic instability. ECG shows persistent marked ST elevation inferiorly.  In the ICU, patient arrested with VT and PEA on multiple occasions. Shocked x4. Epi/atropine given. Torsades noted and mag given. Patient resuscitated successfully and returned to cath lab with acute stent re-occlusion. Repeat stenting performed.  2/3 : No new events overnight. Remains on IABP 1:1. Levophed 5 mcg- titrating off. BP much better.  Intubated but wide awake. Denies CP.   2/4: IABP removed and Extubated 2/3. Pt doing well until midnight when sats dropped to 80's. Mild SOB.  2/5: Patient developed moderate to severe pulmonary edema yesterday- not respiratory support until this a.m.-diuresed with Lasix- patient getting better- on nasal cannula now. No complaints of chest pain, nausea vomiting.   Objective: Vital signs in last 24 hours: Filed Vitals:   09/01/11 0500 09/01/11 0600 09/01/11 0700 09/01/11 0746  BP: 131/53 134/60 128/59   Pulse: 72 85 77 80  Temp:      TempSrc:      Resp: 17 16 19 18   Height:      Weight:      SpO2: 100% 95% 96% 94%   Weight change:   Intake/Output Summary (Last 24 hours) at 09/01/11 0802 Last data filed at 09/01/11 0700  Gross per 24 hour  Intake    842 ml  Output   6350 ml  Net  -5508 ml   Physical Exam: General: sleepy. Cardiac: S1S2, RRR, no murmur. Pulm: mild diffuse crackles at bases, moving normal volumes of air. No wheezes. Abd: soft, nontender, nondistended, BS present  Ext: warm and well  perfused, no pedal edema . R groin with ecchymosis. Soft. No bruit Neuro: A&Ox3, non focal.  Lab Results: Basic Metabolic Panel:  Lab 09/01/11 1308 08/31/11 0504 08/30/11 0240  NA 143 137 --  K 3.1* 3.6 --  CL 106 109 --  CO2 30 23 --  GLUCOSE 108* 129* --  BUN 12 8 --  CREATININE 0.61 0.59 --  CALCIUM 7.9* 7.6* --  MG -- 2.1 2.1  PHOS -- 1.7* 2.2*   Liver Function Tests:  Lab 08/28/11 1415  AST 26  ALT 27  ALKPHOS 38*  BILITOT 0.2*  PROT 5.6*  ALBUMIN 2.9*   CBC:  Lab 09/01/11 0549 08/31/11 1205  WBC 13.1* 17.2*  NEUTROABS -- --  HGB 8.8* 9.9*  HCT 25.8* 28.9*  MCV 83.5 83.5  PLT 196 201   Cardiac Enzymes:  Lab 09/01/11 0549 08/31/11 0910 08/30/11 1034  CKTOTAL 340* 619* 591*  CKMB 3.7 7.4* 13.4*  CKMBINDEX -- -- --  TROPONINI 1.98* 3.21* 5.00*   CBG:  Lab 09/01/11 0414 08/31/11 2317 08/31/11 1926 08/31/11 1542 08/31/11 1150 08/31/11 0826  GLUCAP 107* 124* 186* 112* 205* 144*   Hemoglobin A1C:  Lab 08/28/11 1415  HGBA1C 7.3*   Fasting Lipid Panel:  Lab 08/28/11 1415  CHOL 150  HDL 38*  LDLCALC 86  TRIG 657  CHOLHDL 3.9  LDLDIRECT --   Thyroid Function Tests:  Lab 08/28/11 1927  TSH 1.552  T4TOTAL --  FREET4 --  T3FREE --  THYROIDAB --  Coagulation:  Lab 09/01/11 0549 08/31/11 1205 08/29/11 0209 08/28/11 1415  LABPROT 16.5* 14.4 15.7* 65.4*  INR 1.31 1.10 1.22 7.61*    Micro Results: Recent Results (from the past 240 hour(s))  MRSA PCR SCREENING     Status: Normal   Collection Time   08/29/11  1:58 AM      Component Value Range Status Comment   MRSA by PCR NEGATIVE  NEGATIVE  Final    Studies/Results: Dg Chest Port 1 View  09/01/2011  *RADIOLOGY REPORT*  Clinical Data: Acute pulmonary edema  PORTABLE CHEST - 1 VIEW  Comparison: Chest radiograph 08/31/2011  Findings: Stable cardiac silhouette.  There is perihilar air space disease not changed from prior.  There is some focality in the right upper lobe which is increased compared  to prior.  Low lung volumes.  No pneumothorax. a  IMPRESSION: 1.  Perihilar air space disease is unchanged.  2.  Increased air space disease in the right upper lobe.  Original Report Authenticated By: Genevive Bi, M.D.   Dg Chest Port 1 View  08/31/2011  *RADIOLOGY REPORT*  Clinical Data: Respiratory failure.  Edema.  PORTABLE CHEST - 1 VIEW  Comparison: Multiple priors, most recently chest x-ray 08/30/2011.  Findings: Compared to the prior examination, the endotracheal tube, intra-aortic balloon pump and nasogastric tube have both been removed.  Lung volumes remain low, and there is been significant interval increase in cephalization of the pulmonary vasculature, indistinctness of interstitial markings and patchy air space disease throughout the lungs bilaterally, most likely representative of moderate - severe pulmonary edema.  Blunting of the right costophrenic sulcus could suggest presence of a small right-sided pleural effusion.  Heart size is borderline enlarged. Atherosclerotic calcifications in the arch of the aorta.  IMPRESSION:  1.  Interval removal of support apparatus, as above. 2.  Marked interval increase in pulmonary edema, which is now moderate - severe. 3.  Probable small right-sided pleural effusion.  Original Report Authenticated By: Florencia Reasons, M.D.   Medications:  Scheduled Meds:    . antiseptic oral rinse  15 mL Mouth Rinse QID  . aspirin  325 mg Oral Daily  . chlorhexidine  15 mL Mouth Rinse BID  . clopidogrel  75 mg Oral Q breakfast  . digoxin  0.125 mg Oral Daily  . furosemide  40 mg Intravenous STAT  . heparin subcutaneous  5,000 Units Subcutaneous Q8H  . hydrocortisone sodium succinate  50 mg Intravenous Q6H  . insulin aspart  0-4 Units Subcutaneous Q4H  . insulin glargine  24 Units Subcutaneous Daily  . levothyroxine  100 mcg Oral QAC breakfast  . metoprolol tartrate  12.5 mg Oral BID  . nitroGLYCERIN      . pantoprazole (PROTONIX) IV  40 mg Intravenous  Q12H  . potassium chloride  40 mEq Oral BID  . potassium chloride  40 mEq Oral Once   Followed by  . potassium chloride  40 mEq Oral Once  . rosuvastatin  40 mg Oral Daily  . sodium phosphate  Dextrose 5% IVPB  30 mmol Intravenous Once  . vancomycin  750 mg Intravenous Q12H  . DISCONTD: furosemide  40 mg Intravenous Once  . DISCONTD: furosemide  40 mg Intravenous BID  . DISCONTD: furosemide  80 mg Intravenous Once   Continuous Infusions:    . sodium chloride 10 mL/hr at 09/01/11 0700  . dextrose    . nitroGLYCERIN 20 mcg/min (09/01/11 0700)  . norepinephrine (LEVOPHED) Adult infusion Stopped (08/30/11 0810)  PRN Meds:.ondansetron (ZOFRAN) IV  Assessment/Plan: 1) STEMI: s/p RCA bare-metal stenting, Day #4.   - Continue Plavix, and ASA-  P2Y12  pending -  D/C'd Integrilin, heparin gtt 2/3.  - 2-D echo: EF 50-55%- with grade 1 diastolic dysfunction, Mild MR with mild increased PA pressure   - Statin started  - Troponin trending down.  2) Acute Respiratory distress: Secondary to fluid overload and pulmonary edema. -  Was 7 L positive since admission. -  diuresed about 5400 cc yesterday with 120 mg IV Lasix total. - On nasal cannula now. Needed BiPAP overnight.   2) Cardiogenic shock: Secondary to STEMI . Resolved. -  IABP removed-2/3. - Levophed titrated off .  - Blood pressure was moderate to severely elevated yesterday with respiratory distress- respond well to nitro drip and Lasix.   3) DM 2: HbA1c 7.3, adequately controlled.  - Sensitive sliding scale.   4) Hypothyroidism: TSH wnl - resume synthroid- 100 mcg daily.  5) Groin hematoma - stable- femoral sheath removed yesterday.   6) Heart block - due to inferior STEMI. Resolved. TVP removed- 2/3.   7) Leukocytosis - WBC 13.1 >12.1 Likely due to shock/STEMI. However given all the instrumentation she has had and remaining equipment in place will cover with Vancomycin for the short-term. - Concerning right upper  lobe disease per chest x-ray this morning but no fevers. - Will consider covering for aspiration pneumonia-with clindamycin if clinically indicated- which is unusual for right upper lobe disease. - Will add Zosyn per Pharmacy for possible HCAP- decided after discussing with Dr. Daleen Squibb.  8) Anemia: Likely blood loss anemia .Hemoglobin 8.8.  - Transfuse if Hb less than 8. - Consider checking anemia panel as outpatient if hemoglobin stays low.   LOS: 4 days   Alyn Riedinger 09/01/2011, 8:02 AM  Patient examined and agree except changes made. Wet by exam with rales and S3. Will give 80mg  of IV Lasix now with aggressive KCL replacement. Check BMET in am. Stop IV NTG. Increase Lopressor to 25mg  bid.  Valera Castle, MD 09/01/2011 9:41 AM

## 2011-09-01 NOTE — Progress Notes (Signed)
Name: Alicia Medina MRN: [id number] DOB: Apr 28, 1933    LOS: 4  PCCM PROGRESS NOTE  History of Present Illness:  78 y/o WF with unknown medical history presented to [facility] ED as STEMI with nausea, vomiting, ST elevation in inferior leads.  She was transported emergently to cath lab and during prep was noted to have recurrent episodes of V-Fib requiring defibrillation and emergent intubation.  Further developed complete heart block requiring external pacing with cardiogenic shock.  LAD with 90% ostial lesion, dominant RCA occluded.  Bare metal stent placed and intra-aortic balloon pump due to hemodynamic instability.  Coded in ICU post cath and returned to cath lab.  Lines / Drains: OETT 2/1>>>2/3 R Fem Venous Sheath 2/1>>>2/3 L Fem Arterial Sheath 2/1>>>2/3  Cultures: 2/2 mrsa PCR: neg  Antibiotics: 2/2  Vancomycin >> 2/5 2/5 Zosyn (HCAP per cards) >>  Tests / Events: 2/5: feels much better, some cough with sputum  Vital Signs: Temp:  [97.9 F (36.6 C)-99.4 F (37.4 C)] 97.9 F (36.6 C) (02/05 0810) Pulse Rate:  [72-118] 94  (02/05 1000) Resp:  [15-27] 16  (02/05 1000) BP: (112-183)/(48-96) 128/54 mmHg (02/05 1000) SpO2:  [92 %-100 %] 93 % (02/05 1000) FiO2 (%):  [3 %-100 %] 3 % (02/05 0900) I/O last 3 completed shifts: In: 1967 [P.O.:300; I.V.:955; IV Piggyback:712] Out: 6845 [Urine:6845]  Physical Examination: Gen: acute resp distress HEENT: NCAT, PERRL, EOMi, PULM: Insp crackles in bases, more clear today CV: Tachy, regular s1/s2, no clear gallop appreciated AB: BS+, soft, nontender, no hsm Ext: edema noted, warm Neuro: Awake and alert, answering questions clearly and following commands, maew   Ventilator settings: Vent Mode:  [-]  FiO2 (%):  [3 %-100 %] 3 %  Labs    CBC  Lab 09/01/11 0549 08/31/11 1205 08/31/11 0504  HGB 8.8* 9.9* 8.7*  HCT 25.8* 28.9* 25.7*  WBC 13.1* 17.2* 12.1*  PLT 196 201 181   BMET  Lab 09/01/11 0549 08/31/11 0504 08/30/11 0240  08/29/11 0210 08/28/11 2231  NA 143 137 136 136 136  K 3.1* 3.6 -- -- --  CL 106 109 108 108 106  CO2 30 23 18* 18* 18*  GLUCOSE 108* 129* 179* 276* 406*  BUN 12 8 6 12 13   CREATININE 0.61 0.59 0.57 0.91 0.75  CALCIUM 7.9* 7.6* 7.4* 7.7* 7.9*  MG -- 2.1 2.1 2.1 --  PHOS -- 1.7* 2.2* 1.6* --    Lab 09/01/11 0549 08/31/11 1205 08/29/11 0209 08/28/11 1415  INR 1.31 1.10 1.22 7.61*    Lab 08/29/11 1030 08/29/11 0346 08/28/11 2324 08/28/11 1413 08/28/11 1412  PHART 7.416* 7.314* 7.210* 7.257* --  PCO2ART 27.1* 32.5* 42.9 44.1 --  PO2ART 153.0* 195.0* 194.0* 278.0* --  HCO3 17.4* 16.3* 17.1* 19.7* --  TCO2 18 17 18 21 20   O2SAT 99.0 100.0 99.0 100.0 --    Radiology: 2/4 CXR1. Bilateral pulm edema, much worse this morning 2/5 CXR: edema improved, question RUL infiltrate  Assessment and Plan:  Encephalopathy secondary to sedation, No evidence of anoxia, following command. -extubated, doing well neurologically today considering code over weekend  CAD with severe 2 vessel disease, STEMI, V-fib arrest - s/p cardiac cath with bare metal stent placement 2/1. Cardiogenic shock on pressors / IABP.  Complete heart block.  Acute pulmonary edema and hypertension morning of 2/4.   -Rec's per Cardiology -Off pressors. -cont plavix, heparin and integralin per Cards -IABP out 2/4. -diuresis and nitroglycerine per cardiology, I favor continued diuresis  Acute Respiratory Failure secondary to cardiogenic shock  Lab 08/29/11 1030 08/29/11 0346 08/28/11 2324 08/28/11 1413 08/28/11 1412  PHART 7.416* 7.314* 7.210* 7.257* --  PCO2ART 27.1* 32.5* 42.9 44.1 --  PO2ART 153.0* 195.0* 194.0* 278.0* --  HCO3 17.4* 16.3* 17.1* 19.7* --  TCO2 18 17 18 21 20   O2SAT 99.0 100.0 99.0 100.0 --   -extubated 2/3 -diuresis now and ntg gtt per cardiology  Pneumonia? Has RUL infiltrate, some cough so concern for pneumonia.  Given improving WBC count and overall improved status I believe that it is unlikely  that she is developing pneumonia. -favor short course of antibiotics (5 days total) -incentive spirometry/out of bed today may help with pulmonary toilette, airway clearance  Hyperglycemia secondary to stress response and steroids  Lab 09/01/11 0759 09/01/11 0414 08/31/11 2317 08/31/11 1926 08/31/11 1542  GLUCAP 118* 107* 124* 186* 112*   -SSI -Lantus  Hypophosphatemia - Replace  Hypothyroidism -Synthroid  Elevated WBC (?stress, ?steroids); no evidence of infection  Lab 09/01/11 0549 08/31/11 1205 08/31/11 0504 08/30/11 0240 08/29/11 0209  WBC 13.1* 17.2* 12.1* 16.7* 17.5*   -Empirical Vancomycin for IABP d/c'd 2/3  Best practices / Disposition -Code Status:  Full Code -DVT Px: post cath, angiomax etc per cardiology -GI Px: protonix -NPO  PCCM will sign off, please call if questions.   Yolonda Kida Pulmonary/Critical Care Medicine. Pager: [phone] After hours pager: [phone].

## 2011-09-01 NOTE — Progress Notes (Signed)
Pt wishes to wear NRB for the evening and keep bipap on standby. Pt in no distress and states she is feeling better. BBS slight rhonchi and will clear on cough. SpO2 100% at this time. Pt states she will go back on Bipap if she gets in distress. RN aware.

## 2011-09-02 ENCOUNTER — Inpatient Hospital Stay (HOSPITAL_COMMUNITY): Payer: Medicare Other

## 2011-09-02 LAB — CBC
HCT: 26.5 % — ABNORMAL LOW (ref 36.0–46.0)
MCH: 28.4 pg (ref 26.0–34.0)
MCV: 84.7 fL (ref 78.0–100.0)
Platelets: 258 10*3/uL (ref 150–400)
RDW: 15.6 % — ABNORMAL HIGH (ref 11.5–15.5)

## 2011-09-02 LAB — BASIC METABOLIC PANEL
BUN: 18 mg/dL (ref 6–23)
CO2: 32 mEq/L (ref 19–32)
Chloride: 105 mEq/L (ref 96–112)
Creatinine, Ser: 0.63 mg/dL (ref 0.50–1.10)
GFR calc Af Amer: >90 mL/min (ref >90)
Potassium: 4.1 mEq/L (ref 3.5–5.1)

## 2011-09-02 LAB — GLUCOSE, CAPILLARY
Glucose-Capillary: 136 mg/dL — ABNORMAL HIGH (ref 70–99)
Glucose-Capillary: 150 mg/dL — ABNORMAL HIGH (ref 70–99)
Glucose-Capillary: 99 mg/dL (ref 70–99)

## 2011-09-02 MED ORDER — PRAMOXINE HCL 1 % RE OINT
1.0000 "application " | TOPICAL_OINTMENT | Freq: Three times a day (TID) | RECTAL | Status: DC | PRN
  Administered 2011-09-02: 1 via RECTAL
  Filled 2011-09-02: qty 30

## 2011-09-02 MED ORDER — TICAGRELOR 90 MG PO TABS
90.0000 mg | ORAL_TABLET | Freq: Two times a day (BID) | ORAL | Status: DC
  Administered 2011-09-02 – 2011-09-05 (×6): 90 mg via ORAL
  Filled 2011-09-02 (×8): qty 1

## 2011-09-02 MED ORDER — TICAGRELOR 90 MG PO TABS
180.0000 mg | ORAL_TABLET | Freq: Once | ORAL | Status: AC
  Administered 2011-09-02: 180 mg via ORAL
  Filled 2011-09-02: qty 2

## 2011-09-02 MED ORDER — FUROSEMIDE 10 MG/ML IJ SOLN
40.0000 mg | Freq: Once | INTRAMUSCULAR | Status: AC
  Administered 2011-09-02: 40 mg via INTRAVENOUS
  Filled 2011-09-02: qty 4

## 2011-09-02 MED ORDER — HYDROCORTISONE SOD SUCCINATE 100 MG IJ SOLR
50.0000 mg | Freq: Two times a day (BID) | INTRAMUSCULAR | Status: DC
  Administered 2011-09-02: 50 mg via INTRAVENOUS
  Filled 2011-09-02 (×3): qty 1

## 2011-09-02 MED ORDER — LEVOTHYROXINE SODIUM 150 MCG PO TABS
150.0000 ug | ORAL_TABLET | Freq: Every day | ORAL | Status: DC
  Administered 2011-09-03 – 2011-09-05 (×3): 150 ug via ORAL
  Filled 2011-09-02 (×4): qty 1

## 2011-09-02 NOTE — Evaluation (Signed)
Physical Therapy Evaluation Patient Details Name: Alicia Medina MRN: [id number] DOB: 13-Mar-1933 Today's Date: 09/02/2011  Problem List:  Patient Active Problem List  Diagnoses  . Cardiogenic shock  . Acute transmural inferior wall MI  . Respiratory failure, acute  . Heart block AV complete  . DM2 (diabetes mellitus, type 2)    Past Medical History:  Past Medical History  Diagnosis Date  . Hypothyroidism, postsurgical   . Hypertension   . Hyperlipidemia   . Diabetes mellitus    Past Surgical History:  Past Surgical History  Procedure Date  . Total thyroidectomy October 2008    PT Assessment/Plan/Recommendation PT Assessment Clinical Impression Statement: Pt admitted with cardiogenic shock. She was able to transfer with supervision today and needed min assistance while walking. She did have a few staggering steps while walking and turning her head, will perform a DGI next visit to further assess balance. Pt was able to ambulate 150 feet, took pt off O2 while walking and stats drops to 84%. Put pt back on O2 once sitting in bed and stats went back up to 96%. Pt would benefit from PT services to help increase her endurance, strength and to help her return to her PLOF. PT recommends HHPT when first D/C for follow up therapy services.  PT Recommendation/Assessment: Patient will need skilled PT in the acute care venue PT Problem List: Decreased strength;Decreased activity tolerance;Decreased balance;Decreased mobility;Decreased coordination;Decreased knowledge of use of DME;Decreased safety awareness;Decreased knowledge of precautions PT Therapy Diagnosis : Difficulty walking;Abnormality of gait;Generalized weakness PT Plan PT Frequency: Min 3X/week PT Treatment/Interventions: DME instruction;Gait training;Stair training;Functional mobility training;Therapeutic activities;Therapeutic exercise;Balance training;Patient/family education PT Recommendation Follow Up Recommendations: Home  health PT Equipment Recommended: None recommended by PT (pt has RW and cane at home she can use if need be) PT Goals  Acute Rehab PT Goals PT Goal Formulation: With patient Time For Goal Achievement: 7 days Pt will go Sit to Stand: Independently;without upper extremity assist PT Goal: Sit to Stand - Progress: Goal set today Pt will go Stand to Sit: Independently;without upper extremity assist PT Goal: Stand to Sit - Progress: Goal set today Pt will Ambulate: >150 feet;Independently;with least restrictive assistive device PT Goal: Ambulate - Progress: Goal set today Pt will Go Up / Down Stairs: 3-5 stairs;Independently PT Goal: Up/Down Stairs - Progress: Goal set today Pt will Perform Home Exercise Program: Independently PT Goal: Perform Home Exercise Program - Progress: Goal set today  PT Evaluation Precautions/Restrictions  Precautions Precautions: Fall Required Braces or Orthoses: No Restrictions Weight Bearing Restrictions: No Prior Functioning  Home Living Lives With: Spouse Receives Help From: Family;Neighbor (willing to help whenever they need to) Type of Home: House Home Layout: One level Home Access: Stairs to enter Entrance Stairs-Rails: Right Entrance Stairs-Number of Steps: 3 in front with no railings, 5 steps in garage with railing  Bathroom Shower/Tub: Engineer, manufacturing systems: Standard Bathroom Accessibility: Yes How Accessible: Accessible via walker Home Adaptive Equipment: Walker - rolling;Straight cane Prior Function Level of Independence: Independent with basic ADLs;Independent with transfers;Independent with homemaking with ambulation Driving: Yes Vocation: Unemployed Cognition Cognition Arousal/Alertness: Awake/alert Overall Cognitive Status: Appears within functional limits for tasks assessed Orientation Level: Oriented X4 Sensation/Coordination Sensation Light Touch: Appears Intact Stereognosis: Not tested Hot/Cold: Not  tested Proprioception: Not tested Coordination Gross Motor Movements are Fluid and Coordinated: Yes Fine Motor Movements are Fluid and Coordinated: Yes Extremity Assessment RUE Assessment RUE Assessment: Within Functional Limits LUE Assessment LUE Assessment: Within Functional Limits RLE Assessment  RLE Assessment: Within Functional Limits LLE Assessment LLE Assessment: Within Functional Limits Mobility (including Balance) Bed Mobility Bed Mobility: Yes Sit to Supine: 7: Independent Transfers Transfers: Yes Sit to Stand: 5: Supervision;From chair/3-in-1;From toilet Sit to Stand Details (indicate cue type and reason): needing cueing on progression of exercise Stand to Sit: 5: Supervision;To bed Stand to Sit Details: needing cueing to be back all the way against the bed and reach back for bed before sitting  Ambulation/Gait Ambulation/Gait: Yes Ambulation/Gait Assistance: 4: Min assist Ambulation/Gait Assistance Details (indicate cue type and reason): pt staggered a little during ambulation when turning head, will assess more with walking balance doing a DGI next visit  Ambulation Distance (Feet): 150 Feet Assistive device: None Gait Pattern: Within Functional Limits Stairs: No Wheelchair Mobility Wheelchair Mobility: No  Posture/Postural Control Posture/Postural Control: No significant limitations Balance Balance Assessed: Yes Static Standing Balance Static Standing - Balance Support: No upper extremity supported Static Standing - Level of Assistance: 5: Stand by assistance Dynamic Standing Balance Dynamic Standing - Balance Support: No upper extremity supported;During functional activity Dynamic Standing - Level of Assistance: 4: Min assist Dynamic Standing - Comments: had a few LOB and staggering steps while ambulating, esp when turning around to walk back to room End of Session PT - End of Session Equipment Utilized During Treatment: Gait belt Activity Tolerance:  Patient tolerated treatment well Patient left: in bed;with call bell in reach;with family/visitor present Nurse Communication: Mobility status for transfers;Mobility status for ambulation General Behavior During Session: [facility] for tasks performed Cognition: [facility] for tasks performed  Elvera Bicker 09/02/2011, 11:27 AM

## 2011-09-02 NOTE — Progress Notes (Signed)
Pt refused 2200 dose of heparin, stated that she will take the morning dose at 0600

## 2011-09-02 NOTE — Progress Notes (Signed)
0518: Patient had 11 beats VTACH-rate approximately 160.  Patient sleeping, asymptomatic.  Dr. Gwendalyn Ege notified of all parameters.  Orders rec'd.

## 2011-09-02 NOTE — Progress Notes (Signed)
   CARE MANAGEMENT NOTE 09/02/2011  Patient:  Alicia Medina, Alicia Medina   Account Number:  [id number]  Date Initiated:  09/02/2011  Documentation initiated by:  GRAVES-BIGELOW,Digby Groeneveld  Subjective/Objective Assessment:   Pt admitted with nausea and vomiting found to have a Nstemi. Plan for home on Brilinta-CM did call [facility] and they will have in stock 09-03-11. RN if pt is to d/c. 09-03-11 please allow pt to take night dose home in case Pharmacy not able to get medicaiton in.     Action/Plan:     [facility] # [phone]. CC Marcelino Duster did give pt 30 day free brilinta / co pay card. Cm will do a benefits check for co pay cost.   Anticipated DC Date:  09/03/2011   Anticipated DC Plan:  HOME/SELF CARE      DC Planning Services  CM consult      Choice offered to / List presented to:             Status of service:  In process, will continue to follow Medicare Important Message given?   (If response is "NO", the following Medicare IM given date fields will be blank) Date Medicare IM given:   Date Additional Medicare IM given:    Discharge Disposition:    Per UR Regulation:    Comments:  09-02-11 1654 Tomi Bamberger, RN,BSN [phone] Benefits check in process. MD please write for 30 day free supply no refills and then the original with refills. Pt has card.

## 2011-09-02 NOTE — Progress Notes (Addendum)
Subjective: 78 year old white female who presented 2/1 with an acute ST elevation inferior lateral myocardial infarction associated with cardiogenic shock. She had multiple episodes of ventricular fibrillation and complete heart block. She had stenting of the mid right coronary. She was transferred to the intensive care unit with temporary pacemaker and intra-aortic balloon pump in place. She has had multiple episodes of ventricular fibrillation since then with hemodynamic instability. ECG shows persistent marked ST elevation inferiorly. In the ICU, patient arrested with VT and PEA on multiple occasions. Shocked x4. Epi/atropine given. Torsades noted and mag given. Patient resuscitated successfully and returned to cath lab with acute stent re-occlusion. Repeat stenting performed.   Intubated 2/1 Extubated 2/3  Patient had severe pulmonary edema and has been diuresed aggressively with KCl replacement.  She is net negative 7L in past 48 hrs. Patient feels much better and has no chest pain, nausea or vomiting past 24 hours.  Objective: Vital signs in last 24 hours: Filed Vitals:   09/02/11 0000 09/02/11 0200 09/02/11 0353 09/02/11 0400  BP: 132/67 137/64  134/54  Pulse:      Temp:   98.7 F (37.1 C)   TempSrc:   Oral   Resp: 17 18  18   Height:      Weight:      SpO2: 98% 98% 97%    Weight change:   Intake/Output Summary (Last 24 hours) at 09/02/11 0648 Last data filed at 09/02/11 0300  Gross per 24 hour  Intake   1396 ml  Output   2975 ml  Net  -1579 ml   Physical Exam:  General: NAD  Cardiac: S1S2, RRR, no murmur.  Pulm: mild diffuse crackles at bases, moving normal volumes of air. No wheezes.  Abd: soft, nontender, nondistended, BS present  Ext: warm and well perfused, no pedal edema . R groin with ecchymosis. Soft. No bruit  Neuro: A&Ox3, non focal.  Lab Results: Basic Metabolic Panel:  Lab 09/02/11 1610 09/01/11 0549 08/31/11 0504 08/30/11 0240  NA 141 143 -- --  K 4.1  3.1* -- --  CL 105 106 -- --  CO2 32 30 -- --  GLUCOSE 143* 108* -- --  BUN 18 12 -- --  CREATININE 0.63 0.61 -- --  CALCIUM 8.2* 7.9* -- --  MG -- -- 2.1 2.1  PHOS -- -- 1.7* 2.2*   Liver Function Tests:  Lab 08/28/11 1415  AST 26  ALT 27  ALKPHOS 38*  BILITOT 0.2*  PROT 5.6*  ALBUMIN 2.9*   CBC:  Lab 09/02/11 0540 09/01/11 0549  WBC 10.9* 13.1*  NEUTROABS -- --  HGB 8.9* 8.8*  HCT 26.5* 25.8*  MCV 84.7 83.5  PLT 258 196   Cardiac Enzymes:  Lab 09/01/11 0549 08/31/11 0910 08/30/11 1034  CKTOTAL 340* 619* 591*  CKMB 3.7 7.4* 13.4*  CKMBINDEX -- -- --  TROPONINI 1.98* 3.21* 5.00*   CBG:  Lab 09/01/11 2140 09/01/11 1611 09/01/11 1202 09/01/11 0759 09/01/11 0414 08/31/11 2317  GLUCAP 145* 144* 206* 118* 107* 124*   Hemoglobin A1C:  Lab 08/28/11 1415  HGBA1C 7.3*   Fasting Lipid Panel:  Lab 08/28/11 1415  CHOL 150  HDL 38*  LDLCALC 86  TRIG 960  CHOLHDL 3.9  LDLDIRECT --   Thyroid Function Tests:  Lab 08/28/11 1927  TSH 1.552  T4TOTAL --  FREET4 --  T3FREE --  THYROIDAB --   Coagulation:  Lab 09/01/11 0549 08/31/11 1205 08/29/11 0209 08/28/11 1415  LABPROT 16.5* 14.4 15.7*  65.4*  INR 1.31 1.10 1.22 7.61*    Micro Results: Recent Results (from the past 240 hour(s))  MRSA PCR SCREENING     Status: Normal   Collection Time   08/29/11  1:58 AM      Component Value Range Status Comment   MRSA by PCR NEGATIVE  NEGATIVE  Final    Studies/Results: Dg Chest Port 1 View  09/01/2011  *RADIOLOGY REPORT*  Clinical Data: Acute pulmonary edema  PORTABLE CHEST - 1 VIEW  Comparison: Chest radiograph 08/31/2011  Findings: Stable cardiac silhouette.  There is perihilar air space disease not changed from prior.  There is some focality in the right upper lobe which is increased compared to prior.  Low lung volumes.  No pneumothorax. a  IMPRESSION: 1.  Perihilar air space disease is unchanged.  2.  Increased air space disease in the right upper lobe.  Original  Report Authenticated By: Genevive Bi, M.D.   Medications: Scheduled Meds:   . aspirin  81 mg Oral Daily  . clopidogrel  75 mg Oral Q breakfast  . digoxin  0.125 mg Oral Daily  . furosemide  80 mg Intravenous Once  . heparin subcutaneous  5,000 Units Subcutaneous Q8H  . hydrocortisone sodium succinate  50 mg Intravenous Q6H  . insulin aspart  0-4 Units Subcutaneous TID AC & HS  . insulin glargine  24 Units Subcutaneous Daily  . levothyroxine  100 mcg Oral QAC breakfast  . metoprolol tartrate  25 mg Oral BID  . pantoprazole  40 mg Oral BID  . piperacillin-tazobactam (ZOSYN)  IV  3.375 g Intravenous Q8H  . potassium chloride  40 mEq Oral BID  . potassium chloride  40 mEq Oral Once   Followed by  . potassium chloride  40 mEq Oral Once  . potassium chloride  40 mEq Oral Once  . rosuvastatin  40 mg Oral Daily  . DISCONTD: antiseptic oral rinse  15 mL Mouth Rinse QID  . DISCONTD: aspirin  325 mg Oral Daily  . DISCONTD: chlorhexidine  15 mL Mouth Rinse BID  . DISCONTD: insulin aspart  0-4 Units Subcutaneous Q4H  . DISCONTD: metoprolol tartrate  12.5 mg Oral BID  . DISCONTD: pantoprazole (PROTONIX) IV  40 mg Intravenous Q12H  . DISCONTD: vancomycin  750 mg Intravenous Q12H   Continuous Infusions:   . sodium chloride 10 mL/hr at 09/01/11 0800  . dextrose    . DISCONTD: nitroGLYCERIN Stopped (09/01/11 1000)  . DISCONTD: norepinephrine (LEVOPHED) Adult infusion Stopped (08/30/11 0810)   PRN Meds:.ondansetron (ZOFRAN) IV Assessment/Plan: 1) STEMI: s/p RCA bare-metal stenting, Day #5.  - Continue Plavix, and ASA - P2Y12 : 300 WNL - 2-D echo: EF 50-55%- with grade 1 diastolic dysfunction, Mild MR with mild increased PA pressure  - continue Statin and Beta Blocker. - Transfer to telemetry today.  2) Acute Respiratory distress: Secondary to fluid overload and pulmonary edema.  - net neg 7L past 48 hours. - On nasal cannula now. - Will give IV Lasix 40 mg x1 now. - Repeat 2 view  chest x-ray today.  3) Cardiogenic shock: Secondary to STEMI . Resolved.  - IABP removed-2/3.  - Levophed titrated off .   4) DM 2: HbA1c 7.3, adequately controlled.  - Sensitive sliding scale.   5) Hypothyroidism: TSH wnl  - resume synthroid- 100 mcg daily  6) Groin hematoma - stable- femoral sheath removed.  7) Heart block - due to inferior STEMI.  Resolved. TVP removed- 2/3.  8) Leukocytosis - WBC 10.9> 13.1 >12.1  Likely due to shock/STEMI. However given all the instrumentation she has had and remaining equipment in place will cover with Vancomycin for the short-term.  - Concerning right upper lobe disease per chest x-ray but no fevers.   - Will add Zosyn per Pharmacy for possible HCAP- decided after discussing with Dr. Daleen Squibb. -2/5  9) Anemia: Likely blood loss anemia .Hemoglobin 8.8.  - Transfuse if Hb less than 8.  - Consider checking anemia panel as outpatient if hemoglobin stays low.      LOS: 5 days   Lyn Hollingshead, MD   I have personally seen and examined this patient with Dr. Allena Katz. I agree with the assessment and plan as outlined above. She is doing well. She did have acute stent thrombosis on the day of presentation. She is now on Plavix but PRU was 300 which tells [place] her Plavix is not effectively inhibiting her platelets. Will stop Plavix and start Brilinta. Will load Brilinta 180 mg po x 1 today then use Brilinta 90 mg po BID. Continue other meds. Gentle diuresis today. Continue antibiotics for 5 day course for possible aspiration pneumonia.   Donavan Kerlin 9:21 AM 09/02/2011

## 2011-09-02 NOTE — Evaluation (Signed)
Alicia Medina,PT Acute Rehabilitation [phone] (pager)  

## 2011-09-02 NOTE — Progress Notes (Signed)
CARDIAC REHAB PHASE I   PRE:  Rate/Rhythm: 90 SR    BP: sitting 161/64    SaO2: 96 2L  MODE:  Ambulation: 270 ft   POST:  Rate/Rhythm: 110 ST    BP: sitting 162/82     SaO2: 97 2L  Assist x1 with 2 L O2. Slightly off-balance at times. Wanted to hold to my waist. Will use RW next walk unless more steady. DOE, SaO2 good after walk. Return to bed. Will f/u am. [phone]  Harriet Masson CES, ACSM

## 2011-09-03 LAB — GLUCOSE, CAPILLARY
Glucose-Capillary: 131 mg/dL — ABNORMAL HIGH (ref 70–99)
Glucose-Capillary: 181 mg/dL — ABNORMAL HIGH (ref 70–99)
Glucose-Capillary: 153 mg/dL — ABNORMAL HIGH (ref 70–99)
Glucose-Capillary: 199 mg/dL — ABNORMAL HIGH (ref 70–99)

## 2011-09-03 LAB — BASIC METABOLIC PANEL
BUN: 16 mg/dL (ref 6–23)
Chloride: 101 mEq/L (ref 96–112)
Creatinine, Ser: 0.67 mg/dL (ref 0.50–1.10)
GFR calc Af Amer: >90 mL/min (ref >90)
GFR calc non Af Amer: 83 mL/min — ABNORMAL LOW (ref >90)

## 2011-09-03 LAB — CBC
MCHC: 33.3 g/dL (ref 30.0–36.0)
Platelets: 304 10*3/uL (ref 150–400)
RDW: 15.2 % (ref 11.5–15.5)
WBC: 11.1 10*3/uL — ABNORMAL HIGH (ref 4.0–10.5)

## 2011-09-03 MED ORDER — DOCUSATE SODIUM 100 MG PO CAPS
100.0000 mg | ORAL_CAPSULE | Freq: Every day | ORAL | Status: DC
  Administered 2011-09-03: 100 mg via ORAL
  Filled 2011-09-03 (×3): qty 1

## 2011-09-03 MED ORDER — FUROSEMIDE 40 MG PO TABS
40.0000 mg | ORAL_TABLET | Freq: Every day | ORAL | Status: DC
  Administered 2011-09-03 – 2011-09-04 (×2): 40 mg via ORAL
  Filled 2011-09-03 (×3): qty 1

## 2011-09-03 MED ORDER — POTASSIUM CHLORIDE CRYS ER 20 MEQ PO TBCR
20.0000 meq | EXTENDED_RELEASE_TABLET | Freq: Every day | ORAL | Status: DC
  Administered 2011-09-03 – 2011-09-04 (×2): 20 meq via ORAL
  Filled 2011-09-03 (×2): qty 1

## 2011-09-03 MED ORDER — LISINOPRIL 5 MG PO TABS
5.0000 mg | ORAL_TABLET | Freq: Every day | ORAL | Status: DC
  Administered 2011-09-03 – 2011-09-05 (×3): 5 mg via ORAL
  Filled 2011-09-03 (×3): qty 1

## 2011-09-03 MED FILL — Dextrose Inj 5%: INTRAVENOUS | Qty: 50 | Status: AC

## 2011-09-03 NOTE — Progress Notes (Signed)
Subjective: 78 year old white female who presented 2/1 with an acute ST elevation inferior lateral myocardial infarction associated with cardiogenic shock. She had multiple episodes of ventricular fibrillation and complete heart block. She had stenting of the mid right coronary. She was transferred to the intensive care unit with temporary pacemaker and intra-aortic balloon pump in place. She has had multiple episodes of ventricular fibrillation since then with hemodynamic instability. ECG shows persistent marked ST elevation inferiorly. In the ICU, patient arrested with VT and PEA on multiple occasions. Shocked x4. Epi/atropine given. Torsades noted and mag given. Patient resuscitated successfully and returned to cath lab with acute stent re-occlusion. Repeat stenting performed.   Intubated 2/1 Extubated 2/3  Patient transferred to telemetry yesterday. No SOB. C/o dry nose and throat.  Patient feels much better and has no chest pain, nausea or vomiting past 24 hours.  Objective: Vital signs in last 24 hours: Filed Vitals:   09/02/11 1500 09/02/11 1621 09/02/11 2046 09/03/11 0458  BP:  153/69 148/73 142/72  Pulse: 86 81 92 90  Temp:  97.9 F (36.6 C) 98 F (36.7 C) 98.8 F (37.1 C)  TempSrc:  Oral Oral Oral  Resp:  18 18 18   Height:      Weight:    172 lb 4.8 oz (78.155 kg)  SpO2: 97% 96% 96% 98%   Weight change:   Intake/Output Summary (Last 24 hours) at 09/03/11 0805 Last data filed at 09/02/11 2027  Gross per 24 hour  Intake    854 ml  Output   3150 ml  Net  -2296 ml   Physical Exam:  General: NAD  Cardiac: S1S2, RRR, no murmur.  Pulm: Clear. No wheezes.  Abd: soft, nontender, nondistended, BS present  Ext: warm and well perfused, no pedal edema . R groin with ecchymosis. Soft. No bruit  Extensive hematoma/bruising left groin. Soft. Neuro: A&Ox3, non focal.  Lab Results: Basic Metabolic Panel:  Lab 09/03/11 1610 09/02/11 0540 08/31/11 0504 08/30/11 0240  NA 139 141  -- --  K 3.7 4.1 -- --  CL 101 105 -- --  CO2 29 32 -- --  GLUCOSE 128* 143* -- --  BUN 16 18 -- --  CREATININE 0.67 0.63 -- --  CALCIUM 8.6 8.2* -- --  MG -- -- 2.1 2.1  PHOS -- -- 1.7* 2.2*   Liver Function Tests:  Lab 08/28/11 1415  AST 26  ALT 27  ALKPHOS 38*  BILITOT 0.2*  PROT 5.6*  ALBUMIN 2.9*   CBC:  Lab 09/03/11 0540 09/02/11 0540  WBC 11.1* 10.9*  NEUTROABS -- --  HGB 10.0* 8.9*  HCT 30.0* 26.5*  MCV 84.3 84.7  PLT 304 258   Cardiac Enzymes:  Lab 09/01/11 0549 08/31/11 0910 08/30/11 1034  CKTOTAL 340* 619* 591*  CKMB 3.7 7.4* 13.4*  CKMBINDEX -- -- --  TROPONINI 1.98* 3.21* 5.00*   CBG:  Lab 09/03/11 0542 09/02/11 2042 09/02/11 1635 09/02/11 1207 09/02/11 0731 09/01/11 2140  GLUCAP 131* 99 150* 197* 136* 145*   Hemoglobin A1C:  Lab 08/28/11 1415  HGBA1C 7.3*   Fasting Lipid Panel:  Lab 08/28/11 1415  CHOL 150  HDL 38*  LDLCALC 86  TRIG 960  CHOLHDL 3.9  LDLDIRECT --   Thyroid Function Tests:  Lab 08/28/11 1927  TSH 1.552  T4TOTAL --  FREET4 --  T3FREE --  THYROIDAB --   Coagulation:  Lab 09/01/11 0549 08/31/11 1205 08/29/11 0209 08/28/11 1415  LABPROT 16.5* 14.4 15.7* 65.4*  INR 1.31 1.10 1.22 7.61*    Micro Results: Recent Results (from the past 240 hour(s))  MRSA PCR SCREENING     Status: Normal   Collection Time   08/29/11  1:58 AM      Component Value Range Status Comment   MRSA by PCR NEGATIVE  NEGATIVE  Final    Studies/Results: Dg Chest 2 View  09/02/2011  *RADIOLOGY REPORT*  Clinical Data: Infiltrate, edema.  CHEST - 2 VIEW  Comparison: Chest 09/01/2011.  Findings: Bilateral airspace disease shows marked improvement. Small bilateral pleural effusions are noted.  Heart size is normal.  IMPRESSION: Marked improvement bilateral airspace disease compatible with decreased edema.  Original Report Authenticated By: Bernadene Bell. Maricela Curet, M.D.   Medications: Scheduled Meds:    . aspirin  81 mg Oral Daily  . digoxin   0.125 mg Oral Daily  . furosemide  40 mg Intravenous Once  . heparin subcutaneous  5,000 Units Subcutaneous Q8H  . hydrocortisone sodium succinate  50 mg Intravenous Q12H  . insulin aspart  0-4 Units Subcutaneous TID AC & HS  . insulin glargine  24 Units Subcutaneous Daily  . levothyroxine  150 mcg Oral QAC breakfast  . metoprolol tartrate  25 mg Oral BID  . pantoprazole  40 mg Oral BID  . potassium chloride  40 mEq Oral BID  . rosuvastatin  40 mg Oral Daily  . Ticagrelor  180 mg Oral Once  . Ticagrelor  90 mg Oral BID  . DISCONTD: clopidogrel  75 mg Oral Q breakfast  . DISCONTD: hydrocortisone sodium succinate  50 mg Intravenous Q6H  . DISCONTD: levothyroxine  100 mcg Oral QAC breakfast  . DISCONTD: piperacillin-tazobactam (ZOSYN)  IV  3.375 g Intravenous Q8H   Continuous Infusions:    . sodium chloride 10 mL/hr at 09/01/11 0800  . dextrose     PRN Meds:.ondansetron (ZOFRAN) IV, pramoxine-mineral oil-zinc Assessment/Plan: 1) STEMI: s/p RCA bare-metal stenting, Day #5.  - Continue Brininta and ASA - P2Y12 : 300 WNL on Plavix - 2-D echo: EF 50-55%- with grade 1 diastolic dysfunction, Mild MR with mild increased PA pressure  - continue Statin and Beta Blocker.   2) Acute Respiratory distress: Secondary to fluid overload and pulmonary edema.  - fluid balance negative.  - On nasal cannula now. - start po lasix - CXR yesterday showed marked improvement in edema.  3) Cardiogenic shock: Secondary to STEMI . Resolved.  - IABP removed-2/3.  - Levophed titrated off .   4) DM 2: HbA1c 7.3, adequately controlled.  - Sensitive sliding scale.   5) Hypothyroidism: TSH wnl  - resume synthroid- 100 mcg daily  6) Groin hematoma - stable, Hgb improved. No active bleeding.  7) Heart block - due to inferior STEMI.  Resolved. TVP removed- 2/3.   8) Leukocytosis - WBC 10.9> 13.1 >12.1  Likely due to shock/STEMI. However given all the instrumentation she has had and remaining equipment  in place will cover with Vancomycin for the short-term.  - Concerning right upper lobe disease per chest x-ray but no fevers.   - Will add Zosyn per Pharmacy for possible HCAP- decided after discussing with Dr. Daleen Squibb. -2/5. Will Rx with 5 days antibiotics total. Doubt PNA with marked clearing of CXR findings.  Will stop stress steroids  9) Anemia: Likely blood loss anemia .Hemoglobin 10.0.      Curt Oatis [place] 8:05 AM 09/03/2011

## 2011-09-03 NOTE — Progress Notes (Signed)
Patient ambulated 450 ft assist x 1.  Tolerated well.  Returned to bed. VSS.  Will continue to monitor.

## 2011-09-03 NOTE — Progress Notes (Signed)
CARDIAC REHAB PHASE I   PRE:  Rate/Rhythm: 97 SR    BP: sitting 136/78    SaO2: 99 1 1/2L, 97 RA  MODE:  Ambulation: 460 ft   POST:  Rate/Rhythm: 99    BP: sitting 140/80     SaO2: 97 RA  Doing better today. Did not need O2. SaO2 96-97 RA walking. Encouraged RW. Pt did well with assist x1 and RW. Reluctant to use RW at home. Sts she has standard walker, reluctant to get RW. Also sts she has cane, which this might be sufficient for pt to be more independent and not rely on husband. Will f/u. [id number]  Harriet Masson CES, ACSM

## 2011-09-04 LAB — GLUCOSE, CAPILLARY: Glucose-Capillary: 113 mg/dL — ABNORMAL HIGH (ref 70–99)

## 2011-09-04 LAB — BASIC METABOLIC PANEL
BUN: 13 mg/dL (ref 6–23)
Calcium: 8.5 mg/dL (ref 8.4–10.5)
Creatinine, Ser: 0.83 mg/dL (ref 0.50–1.10)
GFR calc Af Amer: 77 mL/min — ABNORMAL LOW (ref >90)
GFR calc non Af Amer: 67 mL/min — ABNORMAL LOW (ref >90)
Glucose, Bld: 137 mg/dL — ABNORMAL HIGH (ref 70–99)
Potassium: 3.5 mEq/L (ref 3.5–5.1)

## 2011-09-04 LAB — CBC
Hemoglobin: 10.3 g/dL — ABNORMAL LOW (ref 12.0–15.0)
MCH: 28 pg (ref 26.0–34.0)
MCHC: 33.3 g/dL (ref 30.0–36.0)
RDW: 15.5 % (ref 11.5–15.5)

## 2011-09-04 MED ORDER — INSULIN ASPART 100 UNIT/ML ~~LOC~~ SOLN
0.0000 [IU] | Freq: Three times a day (TID) | SUBCUTANEOUS | Status: DC
  Administered 2011-09-04: 8 [IU] via SUBCUTANEOUS
  Administered 2011-09-05: 2 [IU] via SUBCUTANEOUS

## 2011-09-04 MED ORDER — METOPROLOL TARTRATE 50 MG PO TABS
50.0000 mg | ORAL_TABLET | Freq: Two times a day (BID) | ORAL | Status: DC
  Administered 2011-09-04 – 2011-09-05 (×3): 50 mg via ORAL
  Filled 2011-09-04 (×4): qty 1

## 2011-09-04 MED ORDER — INSULIN ASPART 100 UNIT/ML ~~LOC~~ SOLN: 0.0000 [IU] | Freq: Three times a day (TID) | SUBCUTANEOUS | Status: DC

## 2011-09-04 MED ORDER — LOPERAMIDE HCL 2 MG PO CAPS
2.0000 mg | ORAL_CAPSULE | ORAL | Status: AC | PRN
  Administered 2011-09-04: 2 mg via ORAL
  Filled 2011-09-04: qty 1

## 2011-09-04 NOTE — Progress Notes (Signed)
Subjective: 78 year old white female who presented 2/1 with an acute ST elevation inferior lateral myocardial infarction associated with cardiogenic shock. She had multiple episodes of ventricular fibrillation and complete heart block. She had stenting of the mid right coronary. She was transferred to the intensive care unit with temporary pacemaker and intra-aortic balloon pump in place. She has had multiple episodes of ventricular fibrillation since then with hemodynamic instability. ECG shows persistent marked ST elevation inferiorly. In the ICU, patient arrested with VT and PEA on multiple occasions. Shocked x4. Epi/atropine given. Torsades noted and mag given. Patient resuscitated successfully and returned to cath lab with acute stent re-occlusion. Repeat stenting performed.   Intubated 2/1 Extubated 2/3  No SOB. C/o dry nose and throat.  Patient feels much better and has no chest pain, nausea or vomiting past 24 hours. Ambulating well with cardiac rehab.  Objective: Vital signs in last 24 hours: Filed Vitals:   09/03/11 0915 09/03/11 1347 09/03/11 2223 09/04/11 0532  BP: 130/66 126/72 141/82 131/77  Pulse: 96 91 94 89  Temp: 97.4 F (36.3 C) 98.2 F (36.8 C) 98.3 F (36.8 C) 98 F (36.7 C)  TempSrc: Oral Oral Oral Oral  Resp: 14 14 16 18   Height:      Weight:      SpO2: 96% 96% 99% 96%   Weight change:  No intake or output data in the 24 hours ending 09/04/11 0749 Physical Exam:  General: NAD  Cardiac: S1S2, RRR, no murmur.  Pulm: Clear. No wheezes.  Abd: soft, nontender, nondistended, BS present  Ext: warm and well perfused, no pedal edema . R groin with ecchymosis. Soft. No bruit  Extensive bruising left groin. Soft, no bruit. Neuro: A&Ox3, non focal.  Lab Results: Basic Metabolic Panel:  Lab 09/04/11 1308 09/03/11 0540 08/31/11 0504 08/30/11 0240  NA 140 139 -- --  K 3.5 3.7 -- --  CL 99 101 -- --  CO2 33* 29 -- --  GLUCOSE 137* 128* -- --  BUN 13 16 -- --    CREATININE 0.83 0.67 -- --  CALCIUM 8.5 8.6 -- --  MG -- -- 2.1 2.1  PHOS -- -- 1.7* 2.2*   Liver Function Tests:  Lab 08/28/11 1415  AST 26  ALT 27  ALKPHOS 38*  BILITOT 0.2*  PROT 5.6*  ALBUMIN 2.9*   CBC:  Lab 09/04/11 0505 09/03/11 0540  WBC 11.2* 11.1*  NEUTROABS -- --  HGB 10.3* 10.0*  HCT 30.9* 30.0*  MCV 84.0 84.3  PLT 293 304   Cardiac Enzymes:  Lab 09/01/11 0549 08/31/11 0910 08/30/11 1034  CKTOTAL 340* 619* 591*  CKMB 3.7 7.4* 13.4*  CKMBINDEX -- -- --  TROPONINI 1.98* 3.21* 5.00*   CBG:  Lab 09/04/11 0637 09/03/11 2221 09/03/11 1624 09/03/11 1121 09/03/11 0542 09/02/11 2042  GLUCAP 139* 199* 153* 181* 131* 99   Hemoglobin A1C:  Lab 08/28/11 1415  HGBA1C 7.3*   Fasting Lipid Panel:  Lab 08/28/11 1415  CHOL 150  HDL 38*  LDLCALC 86  TRIG 657  CHOLHDL 3.9  LDLDIRECT --   Thyroid Function Tests:  Lab 08/28/11 1927  TSH 1.552  T4TOTAL --  FREET4 --  T3FREE --  THYROIDAB --   Coagulation:  Lab 09/01/11 0549 08/31/11 1205 08/29/11 0209 08/28/11 1415  LABPROT 16.5* 14.4 15.7* 65.4*  INR 1.31 1.10 1.22 7.61*    Micro Results: Recent Results (from the past 240 hour(s))  MRSA PCR SCREENING     Status: Normal  Collection Time   08/29/11  1:58 AM      Component Value Range Status Comment   MRSA by PCR NEGATIVE  NEGATIVE  Final    Studies/Results: Dg Chest 2 View  09/02/2011  *RADIOLOGY REPORT*  Clinical Data: Infiltrate, edema.  CHEST - 2 VIEW  Comparison: Chest 09/01/2011.  Findings: Bilateral airspace disease shows marked improvement. Small bilateral pleural effusions are noted.  Heart size is normal.  IMPRESSION: Marked improvement bilateral airspace disease compatible with decreased edema.  Original Report Authenticated By: Bernadene Bell. Maricela Curet, M.D.   Medications: Scheduled Meds:    . aspirin  81 mg Oral Daily  . docusate sodium  100 mg Oral Daily  . furosemide  40 mg Oral Daily  . heparin subcutaneous  5,000 Units  Subcutaneous Q8H  . insulin aspart  0-4 Units Subcutaneous TID AC & HS  . insulin glargine  24 Units Subcutaneous Daily  . levothyroxine  150 mcg Oral QAC breakfast  . lisinopril  5 mg Oral Daily  . metoprolol tartrate  50 mg Oral BID  . pantoprazole  40 mg Oral BID  . potassium chloride  20 mEq Oral Daily  . rosuvastatin  40 mg Oral Daily  . Ticagrelor  90 mg Oral BID  . DISCONTD: digoxin  0.125 mg Oral Daily  . DISCONTD: hydrocortisone sodium succinate  50 mg Intravenous Q12H  . DISCONTD: metoprolol tartrate  25 mg Oral BID  . DISCONTD: potassium chloride  40 mEq Oral BID   Continuous Infusions:    . sodium chloride 10 mL/hr at 09/01/11 0800  . dextrose     PRN Meds:.ondansetron (ZOFRAN) IV, pramoxine-mineral oil-zinc Assessment/Plan: 1) STEMI: s/p RCA bare-metal stenting x2, Day #5.  - Continue Brininta and ASA - P2Y12 : 300 WNL on Plavix - 2-D echo: EF 50-55%- with grade 1 diastolic dysfunction, Mild MR with mild increased PA pressure  - continue Statin and Beta Blocker.   2) Acute Respiratory distress: Secondary to fluid overload and pulmonary edema.  - fluid balance negative.  -O2 sats are good - on po lasix - CXR 2/6 showed marked improvement in edema.  3) Cardiogenic shock: Secondary to STEMI . Resolved.  - IABP removed-2/3.  - Levophed titrated off .   4) DM 2: HbA1c 7.3, adequately controlled.  - Sensitive sliding scale. Currently on Lantus. Was on po meds prior to admit. Plan to resume po meds on discharge  5) Hypothyroidism: TSH wnl  - resume synthroid- 100 mcg daily  6) Groin hematoma - stable, Hgb improved. No active bleeding.  7) Heart block - due to inferior STEMI.  Resolved. TVP removed- 2/3.   8) Leukocytosis - WBC 10.9> 13.1 >12.1 .11.2 Likely due to shock/STEMI.     Doubt PNA with marked clearing of CXR findings.  Off antibiotics. Afebrile  9) Anemia: Likely blood loss anemia .Hemoglobin 10.3.    10) Disposition: Anticipate discharge this  weekend if she continues to progress. Will increase metoprolol today.  Deneene Tarver [place] 7:49 AM 09/04/2011

## 2011-09-04 NOTE — Progress Notes (Signed)
   CARE MANAGEMENT NOTE 09/04/2011  Patient:  Alicia, Medina   Account Number:  [id number]  Date Initiated:  09/02/2011  Documentation initiated by:  Alicia Medina,Alicia Medina  Subjective/Objective Assessment:   Pt admitted with nausea and vomiting found to have a Nstemi. Plan for home on Brilinta-CM did call [facility] and they will have in stock 09-03-11. Alicia Medina if pt is to d/c. 09-03-11 please allow pt to take night dose home in case     Action/Plan:   Pharmacy not able to get [facility] in  [employer] # [phone]. CC Marcelino Duster did give pt 30 day free brilinta / co pay card. Cm will do a benefits check for co pay cost.   Anticipated DC Date:  09/03/2011   Anticipated DC Plan:  HOME/SELF CARE      DC Planning Services  CM consult      Choice offered to / List presented to:             Status of service:  Completed, signed off Medicare Important Message given?   (If response is "NO", the following Medicare IM given date fields will be blank) Date Medicare IM given:   Date Additional Medicare IM given:    Discharge Disposition:  HOME/SELF CARE  Per UR Regulation:    Comments:  09-04-11 0935 Alicia Medina Alicia Medina Per [facility], medication is covered and the copay will be $85.00  for a 30 day supply. CM will make pt aware.  09-02-11 1654 Alicia Bamberger, Alicia Medina,Alicia Medina [phone] Benefits check in process. MD please write for 30 day free supply no refills and then the original with refills. Pt has card.

## 2011-09-04 NOTE — Progress Notes (Signed)
Physical Therapy Treatment Patient Details Name: ABENA ERDMAN MRN: [id number] DOB: 09/18/1932 Today's Date: 09/04/2011  PT Assessment/Plan  PT - Assessment/Plan Comments on Treatment Session: Mrs. Lasota did well today. Demonstrates higher level balance challenges during gait with DGI of 16/24. Adamantly against using RW but appears receptive to using cane. Will try cane training next visit. Ed pt on her risk of falls and progression from HHPT to OPPT for f/u balance and continued exercise to maintain gains of therapy.  PT Plan: Discharge plan remains appropriate;Frequency remains appropriate Follow Up Recommendations: Home health PT Equipment Recommended: None recommended by PT PT Goals  Acute Rehab PT Goals PT Goal: Sit to Stand - Progress: Partly met PT Goal: Stand to Sit - Progress: Partly met PT Goal: Ambulate - Progress: Progressing toward goal PT Goal: Up/Down Stairs - Progress: Progressing toward goal Additional Goals Additional Goal #1: Pt will demonstrate decreased risk of falls with DGI 20/24  PT Goal: Additional Goal #1 - Progress: Goal set today  PT Treatment Precautions/Restrictions  Precautions Precautions: Fall Required Braces or Orthoses: No Restrictions Weight Bearing Restrictions: No Mobility (including Balance) Bed Mobility Bed Mobility: No (pt sitting upright in her chair) Transfers Sit to Stand: 6: Modified independent (Device/Increase time);With armrests;With upper extremity assist Stand to Sit: 6: Modified independent (Device/Increase time);With upper extremity assist;With armrests Ambulation/Gait Ambulation/Gait Assistance: 4: Min assist Ambulation/Gait Assistance Details (indicate cue type and reason): amb. approx 500 ft without AD; mingaurdA-minA needing minA to correct staggers with dynamic challenges (see DGI); slightly decreased speed  Ambulation Distance (Feet): 500 Feet Assistive device: None Stairs: Yes Stairs Assistance Details (indicate cue  type and reason): mingaurdA during stairs (slight stagger when pt stepped on her robe but she was able to correct independently by slowing down and holding onto rail Stair Management Technique: One rail Right Number of Stairs: 10   Posture/Postural Control Posture/Postural Control: No significant limitations Dynamic Gait Index Level Surface: Mild Impairment Change in Gait Speed: Mild Impairment Gait with Horizontal Head Turns: Mild Impairment Gait with Vertical Head Turns: Mild Impairment Gait and Pivot Turn: Mild Impairment Step Over Obstacle: Mild Impairment Step Around Obstacles: Mild Impairment Steps: Mild Impairment Total Score: 16  High Level Balance High Level Balance Comments: amb with slight stagger at baseline, might benefit using an [facility], reports she has one at home Exercise    End of Session PT - End of Session Equipment Utilized During Treatment: Gait belt Activity Tolerance: Patient tolerated treatment well Patient left: in chair;with call bell in reach Nurse Communication: Mobility status for transfers;Mobility status for ambulation General Behavior During Session: [facility] for tasks performed Cognition: [facility] for tasks performed  [facility] HELEN 09/04/2011, 9:22 AM

## 2011-09-04 NOTE — Progress Notes (Signed)
CARDIAC REHAB PHASE I   PRE:  Rate/Rhythm: 86  BP:  Supine:   Sitting: 130/73  Standing:    SaO2: 94% RA  MODE:  Ambulation: 460 ft   POST:  Rate/Rhythem: 94  BP:  Supine:   Sitting: 130/62  Standing:    SaO2: 97% RA  1130 - 1430   Pt ambulated with assist steady, tolerated well. No c/o cp or sob. Education done with understanding. Permission for out pt cardiac rehab.   Rosalie Doctor

## 2011-09-04 NOTE — Progress Notes (Signed)
   CARE MANAGEMENT NOTE 09/04/2011  Patient:  Alicia Medina, Alicia Medina   Account Number:  [id number]  Date Initiated:  09/02/2011  Documentation initiated by:  GRAVES-BIGELOW,Neriah Brott  Subjective/Objective Assessment:   Pt admitted with nausea and vomiting found to have a Nstemi. Plan for home on Brilinta-CM did call [facility] and they will have in stock 09-03-11. RN if pt is to d/c. 09-03-11 please allow pt to take night dose home in case     Action/Plan:   Pharmacy not able to get [facility] in  [employer] # [phone]. CC Marcelino Duster did give pt 30 day free brilinta / co pay card. Cm will do a benefits check for co pay cost.   Anticipated DC Date:  09/03/2011   Anticipated DC Plan:  HOME/SELF CARE      DC Planning Services  CM consult      Choice offered to / List presented to:             Status of service:  Completed, signed off Medicare Important Message given?   (If response is "NO", the following Medicare IM given date fields will be blank) Date Medicare IM given:   Date Additional Medicare IM given:    Discharge Disposition:  HOME/SELF CARE  Per UR Regulation:    Comments:  09-04-11 [address], [place] [phone] Per MD notes:  presented 2/1 with an acute ST elevation inferior lateral myocardial infarction associated with cardiogenic shock. She had multiple episodes of ventricular fibrillation and complete heart block. She had stenting of the mid right coronary. She was transferred to the intensive care unit with temporary pacemaker and intra-aortic balloon pump in place. She has had multiple episodes of ventricular fibrillation since then with hemodynamic instability. ECG shows persistent marked ST elevation inferiorly. In the ICU, patient arrested with VT and PEA on multiple occasions. Shocked x4. Epi/atropine given. Torsades noted and mag given. Patient resuscitated successfully and returned to cath lab with acute stent re-occlusion. Repeat stenting  performed. *CM did see therapy notes and will speak to pt about home services. CM did speak to pt and husband and they are both refusing services for home health. Pt stated she was ambulating well enough for d/c. CM will call [facility]- to make sure that medication will be available.  Medication not available at this time. Pt is planned for d/c over the weekend. RN please supply pt with evening dose  of brilinta before d/c if done over the weekend. Thanks- CM  did call [facility] and they have medication available. CM will fax  30 day free rx no refills to [phone]. Husband will pick up meds by 4pm on Sat. Thanks  09-04-11 3151 Gerome Apley [phone] Per [facility], medication is covered and the copay will be $85.00  for a 30 day supply. CM will make pt aware.  09-02-11 1654 Tomi Bamberger, RN,BSN [phone] Benefits check in process. MD please write for 30 day free supply no refills and then the original with refills. Pt has card.

## 2011-09-05 LAB — GLUCOSE, CAPILLARY: Glucose-Capillary: 229 mg/dL — ABNORMAL HIGH (ref 70–99)

## 2011-09-05 MED ORDER — ASPIRIN 81 MG PO CHEW: 81.0000 mg | CHEWABLE_TABLET | Freq: Every day | ORAL | Status: DC

## 2011-09-05 MED ORDER — TICAGRELOR 90 MG PO TABS: 90.0000 mg | ORAL_TABLET | Freq: Two times a day (BID) | ORAL | Status: DC

## 2011-09-05 MED ORDER — PANTOPRAZOLE SODIUM 40 MG PO TBEC: 40.0000 mg | DELAYED_RELEASE_TABLET | Freq: Every day | ORAL | Status: DC

## 2011-09-05 MED ORDER — ROSUVASTATIN CALCIUM 40 MG PO TABS: 40.0000 mg | ORAL_TABLET | Freq: Every day | ORAL | Status: DC

## 2011-09-05 MED ORDER — POTASSIUM CHLORIDE CRYS ER 20 MEQ PO TBCR: 20.0000 meq | EXTENDED_RELEASE_TABLET | Freq: Every day | ORAL | Status: DC

## 2011-09-05 MED ORDER — METOPROLOL TARTRATE 50 MG PO TABS: 50.0000 mg | ORAL_TABLET | Freq: Two times a day (BID) | ORAL | Status: DC

## 2011-09-05 MED ORDER — FUROSEMIDE 40 MG PO TABS: 40.0000 mg | ORAL_TABLET | Freq: Every day | ORAL | Status: DC

## 2011-09-05 NOTE — Progress Notes (Signed)
CARDIAC REHAB PHASE I  Patient declines walk at this time due to being told she was going home. Patient was not happy that she was told this morning that she was to go home today and she is still sitting her at 2pm. I explained to the patient that the doctors were making their rounds. Will continue to follow patient if still here on Monday.    BROWN, Tetsuo Coppola L

## 2011-09-05 NOTE — Progress Notes (Signed)
Patient ID: Alicia Medina, female   DOB: 1933/01/19, 78 y.o.   MRN: [id number]  Subjective: 78 year old white female who presented 2/1 with an acute ST elevation inferior lateral myocardial infarction associated with cardiogenic shock. She had multiple episodes of ventricular fibrillation and complete heart block. She had stenting of the mid right coronary. She was transferred to the intensive care unit with temporary pacemaker and intra-aortic balloon pump in place. She has had multiple episodes of ventricular fibrillation since then with hemodynamic instability. ECG shows persistent marked ST elevation inferiorly. In the ICU, patient arrested with VT and PEA on multiple occasions. Shocked x4. Epi/atropine given. Torsades noted and mag given. Patient resuscitated successfully and returned to cath lab with acute stent re-occlusion. Repeat stenting performed.   Intubated 2/1 Extubated 2/3  No SOB. C/o dry nose and throat.  Patient feels much better and has no chest pain, nausea or vomiting past 24 hours. Ambulating well with cardiac rehab.   Wants to go home  Objective: Vital signs in last 24 hours: Filed Vitals:   09/04/11 1415 09/04/11 2101 09/04/11 2236 09/05/11 0550  BP: 130/73 125/73 148/70 134/76  Pulse: 86 100 90 80  Temp: 98 F (36.7 C) 97.9 F (36.6 C)  97.9 F (36.6 C)  TempSrc: Oral Oral  Oral  Resp: 18 16  12   Height:      Weight:      SpO2: 94% 97%  95%   Weight change:   Intake/Output Summary (Last 24 hours) at 09/05/11 0912 Last data filed at 09/05/11 0827  Gross per 24 hour  Intake    360 ml  Output      0 ml  Net    360 ml   Physical Exam:  General: NAD  Cardiac: S1S2, RRR, no murmur.  Pulm: Clear. No wheezes.  Abd: soft, nontender, nondistended, BS present  Ext: warm and well perfused, no pedal edema . R groin with ecchymosis. Soft. No bruit  Extensive bruising left groin. Soft, no bruit. Neuro: A&Ox3, non focal.  Lab Results: Basic Metabolic  Panel:  Lab 09/04/11 0505 09/03/11 0540 08/31/11 0504 08/30/11 0240  NA 140 139 -- --  K 3.5 3.7 -- --  CL 99 101 -- --  CO2 33* 29 -- --  GLUCOSE 137* 128* -- --  BUN 13 16 -- --  CREATININE 0.83 0.67 -- --  CALCIUM 8.5 8.6 -- --  MG -- -- 2.1 2.1  PHOS -- -- 1.7* 2.2*   Liver Function Tests: No results found for this basename: AST:2,ALT:2,ALKPHOS:2,BILITOT:2,PROT:2,ALBUMIN:2 in the last 168 hours CBC:  Lab 09/04/11 0505 09/03/11 0540  WBC 11.2* 11.1*  NEUTROABS -- --  HGB 10.3* 10.0*  HCT 30.9* 30.0*  MCV 84.0 84.3  PLT 293 304   Cardiac Enzymes:  Lab 09/01/11 0549 08/31/11 0910 08/30/11 1034  CKTOTAL 340* 619* 591*  CKMB 3.7 7.4* 13.4*  CKMBINDEX -- -- --  TROPONINI 1.98* 3.21* 5.00*   CBG:  Lab 09/05/11 0653 09/04/11 2158 09/04/11 1609 09/04/11 1116 09/04/11 0637 09/03/11 2221  GLUCAP 134* 154* 113* 254* 139* 199*   Hemoglobin A1C: No results found for this basename: HGBA1C in the last 168 hours Fasting Lipid Panel: No results found for this basename: CHOL,HDL,LDLCALC,TRIG,CHOLHDL,LDLDIRECT in the last 536 hours Thyroid Function Tests: No results found for this basename: TSH,T4TOTAL,FREET4,T3FREE,THYROIDAB in the last 168 hours Coagulation:  Lab 09/01/11 0549 08/31/11 1205  LABPROT 16.5* 14.4  INR 1.31 1.10    Micro Results: Recent Results (from  the past 240 hour(s))  MRSA PCR SCREENING     Status: Normal   Collection Time   08/29/11  1:58 AM      Component Value Range Status Comment   MRSA by PCR NEGATIVE  NEGATIVE  Final    Studies/Results: No results found. Medications: Scheduled Meds:    . aspirin  81 mg Oral Daily  . docusate sodium  100 mg Oral Daily  . furosemide  40 mg Oral Daily  . heparin subcutaneous  5,000 Units Subcutaneous Q8H  . insulin aspart  0-15 Units Subcutaneous TID WC  . insulin glargine  24 Units Subcutaneous Daily  . levothyroxine  150 mcg Oral QAC breakfast  . lisinopril  5 mg Oral Daily  . metoprolol tartrate  50 mg  Oral BID  . pantoprazole  40 mg Oral BID  . potassium chloride  20 mEq Oral Daily  . rosuvastatin  40 mg Oral Daily  . Ticagrelor  90 mg Oral BID  . DISCONTD: insulin aspart  0-15 Units Subcutaneous TID WC  . DISCONTD: insulin aspart  0-4 Units Subcutaneous TID AC & HS   Continuous Infusions:    . sodium chloride 10 mL/hr at 09/01/11 0800  . dextrose     PRN Meds:.loperamide, ondansetron (ZOFRAN) IV, pramoxine-mineral oil-zinc Assessment/Plan: 1) STEMI: s/p RCA bare-metal stenting x2, Day #6  Ready for D/C  - Continue Brininta and ASA - P2Y12 : 300 WNL on Plavix - 2-D echo: EF 50-55%- with grade 1 diastolic dysfunction, Mild MR with mild increased PA pressure  - continue Statin and Beta Blocker.   2) Acute Respiratory distress: Secondary to fluid overload and pulmonary edema.  - fluid balance negative.  Resp;ved -O2 sats are good - on po lasix - CXR 2/6 showed marked improvement in edema.  3) Cardiogenic shock: Secondary to STEMI . Resolved.  - IABP removed-2/3.  - Levophed titrated off .   4) DM 2: HbA1c 7.3, adequately controlled.  - Sensitive sliding scale. Currently on Lantus. Was on po meds prior to admit. Plan to resume po meds on discharge  5) Hypothyroidism: TSH wnl  - resume synthroid- 100 mcg daily  6) Groin hematoma - stable, Hgb improved. No active bleeding.  7) Heart block - due to inferior STEMI.  Resolved. TVP removed- 2/3.   8) Leukocytosis - WBC 10.9> 13.1 >12.1 .11.2 Likely due to shock/STEMI.     Doubt PNA with marked clearing of CXR findings.  Off antibiotics. Afebrile  9) Anemia: Likely blood loss anemia .Hemoglobin 10.3.    10) Disposition: D/C home today.    Charlton Haws 9:12 AM 09/05/2011

## 2011-09-05 NOTE — Discharge Summary (Signed)
Discharge Summary   Patient ID: Alicia Medina MRN: [id number], DOB/AGE: [birth year] 78 y.o. Admit date: 08/28/2011 D/C date:     09/05/2011    Primary Cardiologist:  Dr. Peter Swaziland  Primary Electrophysiologist:  None   Reason for Admission:  Inferior STEMI  Primary Discharge Diagnoses:  1.  Acute inferior STEMI  A. Treated with a bare-metal stent to the RCA   B. Complicated by cardiogenic shock, ventricular fibrillation, ventilator dependent respiratory failure and complete   heart block (temporary pacer)  C. Abrupt reocclusion of the RCA stent treated with a second bare-metal stent 2.  CAD 3.  Diastolic CHF 4.  Diabetes mellitus type 2 5.  Blood loss anemia 6.  Leukocytosis 7.  Hypertension 8.  Hyperlipidemia 9.  Groin Hematoma   Secondary Discharge Diagnoses:   Past Medical History  Diagnosis Date  . Hypothyroidism, postsurgical   . Hypertension   . Hyperlipidemia   . Diabetes mellitus      Procedures Performed This Admission:    Cardiac catheterization and percutaneous coronary intervention 08/28/11: Coronary angiography:  Coronary dominance: right  Left mainstem: Normal  Left anterior descending (LAD): 90% ostial lesion followed by long segment of 50-60% disease in the proximal vessel. There was an 80-90% stenosis following the takeoff of the first diagonal.  Left circumflex (LCx): This a small vessel without significant disease.  Right coronary artery (RCA): This is a large dominant vessel that is occluded in the mid vessel. No collaterals are seen.  Left ventriculography: Not performed  PCI Data:  Vessel - RCA/Segment - mid  Percent Stenosis (pre) 100%  TIMI-flow 0  Stent 3.5 x 16 mm Veriflex  Percent Stenosis (post) 0%  TIMI-flow (post) 3   Final Conclusions:  1. Severe 2 vessel obstructive atherosclerotic coronary disease with the culprit lesion of the mid right coronary.  2. Successful intracoronary stenting of the mid right coronary with a bare-metal  stent.  3. Cardiogenic shock with recurrent episodes of ventricular fibrillation, complete heart block, and persistent hypotension requiring IV pressors and intra-aortic balloon pump.   Recommendations: We'll transfer to the intensive care unit with continued ventilator and intra-aortic balloon pump support. Temporary pacing wires left in place. Patient will need NG tube placed in order to give her antiplatelet therapy. We will continue with IV Angiomax until after she has received oral antiplatelet therapy and then she can be transitioned to IV heparin. We'll obtain an echocardiogram to assess LV function. Patient is critically ill.  Emergent PCI 08/28/11:  Lesion Data:  Vessel: Mid RCA  Percent stenosis (pre): 100%  TIMI-flow (pre): 0  Stent: 3.5 x 22 mm integrity  Percent stenosis (post): 0%  TIMI-flow (post): 3   Conclusions: Abrupt thrombotic occlusion of the previously placed stent in the right coronary. This was successfully reperfused with extraction thrombectomy and additional stenting with a bare-metal stent. We will continue patient on aggressive antiplatelet therapy including aspirin, Plavix, and Integrilin. Due to continue hematoma formation at the site of the sheath will be removed. A FemoStop will be placed. We will stop the IV Angiomax and resume heparin IV 4 hours after sheath removal.   Recommendations: As noted above.  Peter Swaziland MD,FACC  08/28/2011, 5:18 PM    Hospital Course: Alicia Medina is a 78 y.o. female with a history of diabetes, hypertension, hyperlipidemia, hypothyroidism who developed nausea and vomiting at home. EMS was summoned and ECG demonstrated acute inferior lateral STEMI. She had multiple bouts of vomiting in the emergency room  and was eventually intubated. She also developed several rounds of ventricular fibrillation. This required defibrillation. She developed complete heart block requiring external pacing. She developed cardiogenic shock.  She was found  to have severe 2 vessel CAD with the culprit being the mid RCA. She was also noted at 90% proximal LAD.  She was treated with a bare-metal stent to the RCA.  She was maintained on pressors and ventilator as well as an intra-aortic balloon pump.  In the ICU, she developed recurrent episodes of VT, PEA and torsades with hemodynamic instability. ECG demonstrated marked inferior ST elevation. She was brought back to the Cath Lab emergently.  Cardiac catheterization demonstrated abrupt thrombotic occlusion of the RCA stent. This was again stented with a bare-metal stent.  She was also seen by the critical care service.  She stabilized. The balloon pump was removed and she was extubated 08/30/11.  Temporary pacer was also removed 08/30/11.  Pressors were titrated off.  She developed signs of volume overload and was treated with IV Lasix, BiPAP and nitroglycerin.  Echocardiogram demonstrated preserved LV function.  She was noted to have leukocytosis and abnormal chest x-ray. She was covered for pneumonia with Zosyn. Hemoglobin was also noted to be low.  It was felt that this was likely blood loss. PRU was noted to be 300. Therefore, her Plavix was discontinued and she was placed on Brilinta.  Volume status improved.  She had a groin hematoma from her cardiac catheterization that remained stable.  She was covered with Lantus during her admission. However, she was switched back to her by mouth medications at discharge.  Her antibiotics were discontinued after 5 days. Her clinical picture was never consistent with pneumonia. Hemoglobin improved prior to discharge.  Her status improved. She was seen by physical therapy and cardiac rehabilitation.  She was evaluated by case management. They help her find a pharmacy that carried Brilinta. She was evaluated by Dr. Charlton Haws on the day of d/c and felt ready for d/c to home.    Discharge Vitals: Blood pressure 134/76, pulse 80, temperature 97.9 F (36.6 C), temperature source  Oral, resp. rate 12, height 5\' 7"  (1.702 m), weight 172 lb 4.8 oz (78.155 kg), SpO2 95.00%.  Labs:  Hemoglobin  Date/Time Value Range Status  09/04/2011  5:05 AM 10.3* 12.0-15.0 (g/dL) Final  07/30/863  7:84 AM 10.0* 12.0-15.0 (g/dL) Final  01/02/6294  2:84 AM 8.9* 12.0-15.0 (g/dL) Final  07/29/2438  1:02 AM 8.8* 12.0-15.0 (g/dL) Final    WBC  Date/Time Value Range Status  09/04/2011  5:05 AM 11.2* 4.0-10.5 (K/uL) Final  09/03/2011  5:40 AM 11.1* 4.0-10.5 (K/uL) Final  09/02/2011  5:40 AM 10.9* 4.0-10.5 (K/uL) Final  09/01/2011  5:49 AM 13.1* 4.0-10.5 (K/uL) Final      Lab 09/04/11 0505  NA 140  K 3.5  CL 99  CO2 33*  BUN 13  CREATININE 0.83  CALCIUM 8.5  PROT --  BILITOT --  ALKPHOS --  ALT --  AST --  GLUCOSE 137*   No results found for this basename: CKTOTAL:4,CKMB:4,TROPONINI:4 in the last 72 hours Lab Results  Component Value Date   CHOL 150 08/28/2011   HDL 38* 08/28/2011   LDLCALC 86 08/28/2011   TRIG 131 08/28/2011    No results found for this basename: DDIMER    Lab Results  Component Value Date   TSH 1.552 08/28/2011   PRU:  300   Diagnostic Studies/Procedures:   2-D echocardiogram 08/30/11: Study Conclusions - Left  ventricle: The cavity size was normal. Wall thickness was increased in a pattern of mild LVH. Systolic function was normal. The estimated ejection fraction was in the range of 50% to 55%. Regional wall motion abnormalities cannot be excluded. Doppler parameters are consistent with abnormal left ventricular relaxation (grade 1 diastolic dysfunction). - Mitral valve: Mild regurgitation. - Pulmonary arteries: Systolic pressure was mildly increased. PA peak pressure: 47mm Hg (S).   Dg Chest 2 View 09/02/2011  IMPRESSION: Marked improvement bilateral airspace disease compatible with decreased edema.  Original Report Authenticated By: Bernadene Bell. Maricela Curet, M.D.   Dg Chest Port 1 View 09/01/2011   IMPRESSION: 1.  Perihilar air space disease is unchanged.  2.   Increased air space disease in the right upper lobe.  Original Report Authenticated By: Genevive Bi, M.D.   Dg Chest Port 1 View 08/31/2011   IMPRESSION:  1.  Interval removal of support apparatus, as above. 2.  Marked interval increase in pulmonary edema, which is now moderate - severe. 3.  Probable small right-sided pleural effusion.  Original Report Authenticated By: Florencia Reasons, M.D.     Discharge Medications   Medication List  As of 09/05/2011  3:20 PM   STOP taking these medications         estradiol 1 MG tablet      lovastatin 20 MG tablet      progesterone 200 MG capsule         TAKE these medications         aspirin 81 MG chewable tablet   Chew 1 tablet (81 mg total) by mouth daily.      furosemide 40 MG tablet   Commonly known as: LASIX   Take 1 tablet (40 mg total) by mouth daily.      glimepiride 2 MG tablet   Commonly known as: AMARYL   Take 2 mg by mouth daily before breakfast.      levothyroxine 150 MCG tablet   Commonly known as: SYNTHROID, LEVOTHROID   Take 150 mcg by mouth daily.      losartan 25 MG tablet   Commonly known as: COZAAR   Take 25 mg by mouth daily.      metformin 500 MG (OSM) 24 hr tablet   Commonly known as: FORTAMET   Take 500 mg by mouth daily with breakfast.      metoprolol 50 MG tablet   Commonly known as: LOPRESSOR   Take 1 tablet (50 mg total) by mouth 2 (two) times daily.      pantoprazole 40 MG tablet   Commonly known as: PROTONIX   Take 1 tablet (40 mg total) by mouth daily.      potassium chloride SA 20 MEQ tablet   Commonly known as: K-DUR,KLOR-CON   Take 1 tablet (20 mEq total) by mouth daily.      rosuvastatin 40 MG tablet   Commonly known as: CRESTOR   Take 1 tablet (40 mg total) by mouth daily.      Ticagrelor 90 MG Tabs tablet   Commonly known as: BRILINTA   Take 1 tablet (90 mg total) by mouth 2 (two) times daily.            Disposition   The patient will be discharged in stable condition  to home. Discharge Orders    Future Orders Please Complete By Expires   Diet - low sodium heart healthy      Diet Carb Modified      Increase  activity slowly      Driving Restrictions      Comments:   None for 2 weeks   Lifting restrictions      Comments:   None for 2 weeks   Sexual Activity Restrictions      Comments:   None for 2 weeks   Discharge wound care:      Comments:   Call for swelling, bleeding, bruising or fever     Follow-up Information    Follow up with Peter Swaziland, MD in 2 weeks. (office will call)    Contact information:   [address] [place] [address] [phone]            Duration of Discharge Encounter: Greater than 30 minutes including physician and PA time.  Signed, Tereso Newcomer, PA-C  3:20 PM 09/05/2011        [address] [place], [place]  [address] Phone: [phone] Fax:  [phone]

## 2011-09-05 NOTE — Progress Notes (Signed)
PT Cancellation Note  Treatment cancelled today due to patient pending discharge at this time and states she has cane at home and does not feel need to practice with PT at this time. She states that she feels safe walking in her home for improved cardiorespiratory fitness as tolerated.  Anouk Critzer 09/05/2011, 9:59 AM

## 2011-09-05 NOTE — Progress Notes (Signed)
Pt. Discharged 09/05/2011  3:40 PM Discharge instructions reviewed with patient/family. Patient/family verbalized understanding. All Rx's given. Questions answered as needed. Pt. Discharged to home with family/self. Taken off unit via W/C. Elease Hashimoto RN

## 2011-09-07 ENCOUNTER — Telehealth: Payer: Self-pay | Admitting: Cardiology

## 2011-09-07 ENCOUNTER — Other Ambulatory Visit: Payer: Self-pay

## 2011-09-07 MED ORDER — POTASSIUM CHLORIDE CRYS ER 20 MEQ PO TBCR: 20.0000 meq | EXTENDED_RELEASE_TABLET | Freq: Every day | ORAL | Status: DC

## 2011-09-07 NOTE — Telephone Encounter (Signed)
New Msg: Pharmacy calling wanting to clarify quantity of pt furosemide. Please return call to discuss further.    RX written by Tereso Newcomer

## 2011-09-07 NOTE — Telephone Encounter (Signed)
New Problem:     Patient called in needing a refill of her potassium chloride SA (K-DUR,KLOR-CON) 20 MEQ tablet filled with [facility] [address], [address]  ([phone]).  This is the new pharmacy that she would like to have her medications refilled at for the time being.

## 2011-09-07 NOTE — Telephone Encounter (Signed)
[facility] called,spoke to pharmacist he stated on discharge prescription furosemide needs # of tablets.Advised # 30 tablets.

## 2011-09-09 ENCOUNTER — Other Ambulatory Visit: Payer: Self-pay | Admitting: *Deleted

## 2011-09-21 ENCOUNTER — Encounter: Payer: Self-pay | Admitting: Nurse Practitioner

## 2011-09-21 ENCOUNTER — Encounter: Payer: Medicare Other | Admitting: Physician Assistant

## 2011-09-21 ENCOUNTER — Ambulatory Visit (INDEPENDENT_AMBULATORY_CARE_PROVIDER_SITE_OTHER): Payer: Medicare Other | Admitting: Nurse Practitioner

## 2011-09-21 VITALS — BP 112/68 | HR 78 | Ht 63.75 in | Wt 163.0 lb

## 2011-09-21 DIAGNOSIS — R0600 Dyspnea, unspecified: Secondary | ICD-10-CM

## 2011-09-21 DIAGNOSIS — I2119 ST elevation (STEMI) myocardial infarction involving other coronary artery of inferior wall: Secondary | ICD-10-CM

## 2011-09-21 DIAGNOSIS — I2510 Atherosclerotic heart disease of native coronary artery without angina pectoris: Secondary | ICD-10-CM

## 2011-09-21 DIAGNOSIS — R0609 Other forms of dyspnea: Secondary | ICD-10-CM

## 2011-09-21 DIAGNOSIS — R0989 Other specified symptoms and signs involving the circulatory and respiratory systems: Secondary | ICD-10-CM

## 2011-09-21 LAB — CBC WITH DIFFERENTIAL/PLATELET
Basophils Absolute: 0 10*3/uL (ref 0.0–0.1)
Basophils Relative: 0.4 % (ref 0.0–3.0)
Eosinophils Absolute: 0.1 10*3/uL (ref 0.0–0.7)
Eosinophils Relative: 1.3 % (ref 0.0–5.0)
HCT: 36.3 % (ref 36.0–46.0)
Hemoglobin: 12 g/dL (ref 12.0–15.0)
Lymphocytes Relative: 15.1 % (ref 12.0–46.0)
Lymphs Abs: 1 10*3/uL (ref 0.7–4.0)
MCHC: 33.1 g/dL (ref 30.0–36.0)
MCV: 88.2 fl (ref 78.0–100.0)
Monocytes Absolute: 0.9 10*3/uL (ref 0.1–1.0)
Monocytes Relative: 12.8 % — ABNORMAL HIGH (ref 3.0–12.0)
Neutro Abs: 4.8 10*3/uL (ref 1.4–7.7)
Neutrophils Relative %: 70.4 % (ref 43.0–77.0)
Platelets: 320 10*3/uL (ref 150.0–400.0)
RBC: 4.11 Mil/uL (ref 3.87–5.11)
RDW: 16.8 % — ABNORMAL HIGH (ref 11.5–14.6)
WBC: 6.9 10*3/uL (ref 4.5–10.5)

## 2011-09-21 LAB — BASIC METABOLIC PANEL
BUN: 19 mg/dL (ref 6–23)
CO2: 29 mEq/L (ref 19–32)
Calcium: 9.4 mg/dL (ref 8.4–10.5)
Chloride: 98 mEq/L (ref 96–112)
Creatinine, Ser: 1.1 mg/dL (ref 0.4–1.2)
GFR: 52.9 mL/min — ABNORMAL LOW (ref >60.00)
Glucose, Bld: 227 mg/dL — ABNORMAL HIGH (ref 70–99)
Potassium: 4 mEq/L (ref 3.5–5.1)
Sodium: 138 mEq/L (ref 135–145)

## 2011-09-21 LAB — BRAIN NATRIURETIC PEPTIDE: Pro B Natriuretic peptide (BNP): 37 pg/mL (ref 0.0–100.0)

## 2011-09-21 NOTE — Progress Notes (Signed)
Alicia Medina Date of Birth: [birth year] Medical Record #[id number]  History of Present Illness: Alicia Medina is seen today for her post hospital visit. She is seen for Dr. Swaziland. She is here with her husband. She has had recent inferior STEMI treated with BMS to the RCA. Her initial presentation was nausea and vomiting with syncope at home. She was defibrillated multiple times. Her hospitalization was complicated by cardiogenic shock, VF, VDRF and CHB requiring temporary pacemaker. She had abrupt reocclusion of the RCA stent that was treated with a 2nd BMS to the RCA. She is on Brilinta. She does have residual LAD disease.  She comes in today. She is actually doing pretty good. She has been home about 2 weeks. No chest pain, but never had any chest pain. Not short of breath. She is tolerating her medicines. Walking some in the house but nothing too strenuous. Not driving. She feels like she is doing ok. She is asking about further disposition for her residual LAD lesion. She had a 90% ostial LAD followed by a long segment of 50 to 60% in the proximal vessel. There is an 80 to 90% stenosis following the takeoff of the first diagonal.   Current Outpatient Prescriptions on File Prior to Visit  Medication Sig Dispense Refill  . aspirin 81 MG chewable tablet Chew 1 tablet (81 mg total) by mouth daily.      . furosemide (LASIX) 40 MG tablet Take 1 tablet (40 mg total) by mouth daily.  30 tablet  5  . glimepiride (AMARYL) 2 MG tablet Take 2 mg by mouth daily before breakfast.      . levothyroxine (SYNTHROID, LEVOTHROID) 150 MCG tablet Take 150 mcg by mouth daily.      Marland Kitchen losartan (COZAAR) 25 MG tablet Take 25 mg by mouth daily.      . metformin (FORTAMET) 500 MG (OSM) 24 hr tablet Take 500 mg by mouth daily with breakfast.      . metoprolol (LOPRESSOR) 50 MG tablet Take 1 tablet (50 mg total) by mouth 2 (two) times daily.  60 tablet  5  . pantoprazole (PROTONIX) 40 MG tablet Take 1 tablet (40 mg total)  by mouth daily.  30 tablet  1  . potassium chloride SA (K-DUR,KLOR-CON) 20 MEQ tablet Take 1 tablet (20 mEq total) by mouth daily.  30 tablet  5  . rosuvastatin (CRESTOR) 40 MG tablet Take 1 tablet (40 mg total) by mouth daily.  30 tablet  5  . Ticagrelor (BRILINTA) 90 MG TABS tablet Take 1 tablet (90 mg total) by mouth 2 (two) times daily.  60 tablet  11    Allergies  Allergen Reactions  . Lisinopril Cough  . Tetracyclines & Related     Past Medical History  Diagnosis Date  . Hypothyroidism, postsurgical   . Hypertension   . Hyperlipidemia   . Diabetes mellitus   . CAD (coronary artery disease) Feb 2013    Inferior STEMI with BMS to the RCA, complicated by cardiogenic shock, VF, VDRF , temporary CHB and abrupt reocclusion of the RCA stent treated with a second BMS to the RCA. Has residual LAD disease. EF is 50%  . Diastolic heart failure   . Blood loss anemia     Past Surgical History  Procedure Date  . Total thyroidectomy October 2008  . Coronary stent placement Feb 2013    BMS to the RCA with abrupt reocclusion treated with 2nd stent    History  Smoking  status  . Never Smoker   Smokeless tobacco  . Not on file    History  Alcohol Use  . 0.6 oz/week  . 1 Glasses of wine per week    Family History  Problem Relation Age of Onset  . Stroke Mother   . Cancer Father   . Stroke Father     Review of Systems: The review of systems is positive for chronic GI issues with gas and diarrhea. No real indigestion.  All other systems were reviewed and are negative.  Physical Exam: BP 112/68  Pulse 78  Ht 5' 3.75" (1.619 m)  Wt 163 lb (73.936 kg)  BMI 28.20 kg/m2 Patient is very pleasant and in no acute distress. Skin is warm and dry. Color is normal.  HEENT is unremarkable. Normocephalic/atraumatic. PERRL. Sclera are nonicteric. Neck is supple. No masses. No JVD. Lungs are relatively clear. Cardiac exam shows a regular rate and rhythm. No murmur noted. Abdomen is soft.  Extremities are without edema. Gait and ROM are intact. No gross neurologic deficits noted.  LABORATORY DATA: PENDING   Assessment / Plan:

## 2011-09-21 NOTE — Assessment & Plan Note (Signed)
Patient is doing well post MI. Has EF of 50 to 55% reported. Does have residual LAD disease. I do not see what the actual disposition is going to be. Will need to discuss with Dr. Swaziland. Will have her hold off on cardiac rehab for now. No change in her current medicines. She is not to drive yet. We will see her back in about 10 days for further disposition. Patient is agreeable to this plan and will call if any problems develop in the interim.

## 2011-09-21 NOTE — Patient Instructions (Signed)
Stay on your current medicines.  We are going to check your labs today.  Dr. Swaziland and I will see you back in about 10 days. We will decide then about opening up this other blockage.   No driving.  Call the [facility] office at [phone] if you have any questions, problems or concerns.

## 2011-10-02 ENCOUNTER — Telehealth: Payer: Self-pay | Admitting: *Deleted

## 2011-10-02 NOTE — Telephone Encounter (Signed)
I had called pt today due to I had received a prior auth for pantoprazole which came to Tereso Newcomer, [place] since he had discharged the pt back in Feb; however this is a Dr. Swaziland pt. Pt states she is taking pantoprazole but said don't bother trying to put the prior auth through, said she will s/w Dr. Swaziland @ 10/09/11 regarding if she needs to stay on pantoprazole or not, I gave verbal understanding to the pt today. I did advice pt though, if she does have any reflux problems then she should contact PCP Dr. Aida Puffer per Tereso Newcomer, PA-C, pt gave me verbal understanding today about this. Danielle Rankin

## 2011-10-05 ENCOUNTER — Other Ambulatory Visit: Payer: Self-pay

## 2011-10-05 ENCOUNTER — Other Ambulatory Visit: Payer: Self-pay | Admitting: Cardiology

## 2011-10-05 MED ORDER — TICAGRELOR 90 MG PO TABS: 90.0000 mg | ORAL_TABLET | Freq: Two times a day (BID) | ORAL | Status: DC

## 2011-10-05 MED ORDER — FUROSEMIDE 40 MG PO TABS: 40.0000 mg | ORAL_TABLET | Freq: Every day | ORAL | Status: DC

## 2011-10-05 NOTE — Telephone Encounter (Signed)
Pt is running out and [facility] has contacted [place] several times

## 2011-10-09 ENCOUNTER — Telehealth: Payer: Self-pay

## 2011-10-09 ENCOUNTER — Encounter: Payer: Self-pay | Admitting: Nurse Practitioner

## 2011-10-09 ENCOUNTER — Ambulatory Visit (INDEPENDENT_AMBULATORY_CARE_PROVIDER_SITE_OTHER): Payer: Medicare Other | Admitting: Nurse Practitioner

## 2011-10-09 VITALS — BP 118/70 | HR 76 | Ht 63.0 in | Wt 163.0 lb

## 2011-10-09 DIAGNOSIS — I2119 ST elevation (STEMI) myocardial infarction involving other coronary artery of inferior wall: Secondary | ICD-10-CM

## 2011-10-09 DIAGNOSIS — I2510 Atherosclerotic heart disease of native coronary artery without angina pectoris: Secondary | ICD-10-CM

## 2011-10-09 NOTE — Assessment & Plan Note (Signed)
Patient is about 6 weeks post MI. Continues to do very well. Her EF was reportedly normal. She is subsequently seen with Dr. Swaziland. It is his feeling that we need further evaluation for this residual LAD lesion. We will have her undergo stress testing to see if this area is ischemic. If it is, will refer to [facility] for CABG. She would like to see Dr. Laneta Simmers if needed. If her study looks ok, will manage her medically and proceed on with advancing her activity, going to rehab, etc. Will tentatively see her back in a month but further disposition to follow. No change in her current medicines. Will check fasting labs on return. Patient is agreeable to this plan and will call if any problems develop in the interim.

## 2011-10-09 NOTE — Progress Notes (Signed)
Alicia Medina Date of Birth: November 19, 1932 Medical Record #[id number]  History of Present Illness: Alicia Medina is seen today for a follow up visit. She is seen with Dr. Swaziland. She has had recent inferior MI treated with BMS to the RCA. She required defibrillation multiple times and her hospitalization was complicated by cardiogenic shock, VF, VDRF and CHB requiring a temporary pacemaker. She had abrupt occlusion of the RCA stent that was treated with a 2nd MBS to the RCA. She is on Brilinta and does have residual LAD disease ostially. Dr. Swaziland has reviewed her films. Not quite clear if the degree of stenosis is as significant at the time of her initial presentation.   She comes in today. She is here with her husband. She continues to do very well. Still not doing much in regards to activity due to the weather. No chest pain but never had any chest pain. Not short of breath. Tolerating her medicines. Not dizzy or lightheaded.   Current Outpatient Prescriptions on File Prior to Visit  Medication Sig Dispense Refill  . aspirin 81 MG chewable tablet Chew 1 tablet (81 mg total) by mouth daily.      Marland Kitchen glimepiride (AMARYL) 2 MG tablet Take 2 mg by mouth daily before breakfast.      . levothyroxine (SYNTHROID, LEVOTHROID) 150 MCG tablet Take 150 mcg by mouth daily.      Marland Kitchen losartan (COZAAR) 25 MG tablet Take 25 mg by mouth daily.      . metformin (FORTAMET) 500 MG (OSM) 24 hr tablet Take 500 mg by mouth daily with breakfast.      . metoprolol (LOPRESSOR) 50 MG tablet Take 1 tablet (50 mg total) by mouth 2 (two) times daily.  60 tablet  5  . potassium chloride SA (K-DUR,KLOR-CON) 20 MEQ tablet Take 1 tablet (20 mEq total) by mouth daily.  30 tablet  5  . rosuvastatin (CRESTOR) 40 MG tablet Take 1 tablet (40 mg total) by mouth daily.  30 tablet  5  . Ticagrelor (BRILINTA) 90 MG TABS tablet Take 1 tablet (90 mg total) by mouth 2 (two) times daily.  60 tablet  11  . DISCONTD: furosemide (LASIX) 40 MG tablet Take  1 tablet (40 mg total) by mouth daily.  30 tablet  5    Allergies  Allergen Reactions  . Lisinopril Cough  . Tetracyclines & Related     Past Medical History  Diagnosis Date  . Hypothyroidism, postsurgical   . Hypertension   . Hyperlipidemia   . Diabetes mellitus   . CAD (coronary artery disease) Feb 2013    Inferior STEMI with BMS to the RCA, complicated by cardiogenic shock, VF, VDRF , temporary CHB and abrupt reocclusion of the RCA stent treated with a second BMS to the RCA. Has residual LAD disease. EF is 50%  . Diastolic heart failure   . Blood loss anemia     Past Surgical History  Procedure Date  . Total thyroidectomy October 2008  . Coronary stent placement Feb 2013    BMS to the RCA with abrupt reocclusion treated with 2nd stent    History  Smoking status  . Never Smoker   Smokeless tobacco  . Not on file    History  Alcohol Use  . 0.6 oz/week  . 1 Glasses of wine per week    Family History  Problem Relation Age of Onset  . Stroke Mother   . Cancer Father   . Stroke Father  Review of Systems: The review of systems is per the HPI.  All other systems were reviewed and are negative.  Physical Exam: BP 118/70  Pulse 76  Ht 5\' 3"  (1.6 m)  Wt 163 lb (73.936 kg)  BMI 28.87 kg/m2 Patient is very pleasant and in no acute distress. She looks great!. Skin is warm and dry. Color is normal.  HEENT is unremarkable. Normocephalic/atraumatic. PERRL. Sclera are nonicteric. Neck is supple. No masses. No JVD. Lungs are clear. Cardiac exam shows a regular rate and rhythm. Abdomen is soft. Extremities are without edema. Gait and ROM are intact. No gross neurologic deficits noted.  LABORATORY DATA: Lab Results  Component Value Date   WBC 6.9 09/21/2011   HGB 12.0 09/21/2011   HCT 36.3 09/21/2011   PLT 320.0 09/21/2011   GLUCOSE 227* 09/21/2011   CHOL 150 08/28/2011   TRIG 131 08/28/2011   HDL 38* 08/28/2011   LDLCALC 86 08/28/2011   ALT 27 08/28/2011   AST 26 08/28/2011     NA 138 09/21/2011   K 4.0 09/21/2011   CL 98 09/21/2011   CREATININE 1.1 09/21/2011   BUN 19 09/21/2011   CO2 29 09/21/2011   TSH 1.552 08/28/2011   INR 1.31 09/01/2011   HGBA1C 7.3* 08/28/2011     Assessment / Plan:

## 2011-10-09 NOTE — Telephone Encounter (Signed)
Patient called, was told received fax from [facility] that her insurance no longer will cover pantoprazole.Patient stated someone had already called her and told her to call PCP. Marland Kitchen

## 2011-10-09 NOTE — Patient Instructions (Signed)
We are going to arrange for a stress test to further define this remaining blockage that you have. If your test is abnormal, we will need to consider referral for CABG.  If the study is ok, we will try to continue to manage you with medicines.  Stay on your current medicines.  We will see you back in a month.  Call the [facility] office at [phone] if you have any questions, problems or concerns.

## 2011-10-13 ENCOUNTER — Ambulatory Visit (HOSPITAL_COMMUNITY): Payer: Medicare Other | Attending: Cardiology | Admitting: Radiology

## 2011-10-13 VITALS — BP 138/74 | Ht 63.0 in | Wt 164.0 lb

## 2011-10-13 DIAGNOSIS — I252 Old myocardial infarction: Secondary | ICD-10-CM | POA: Insufficient documentation

## 2011-10-13 DIAGNOSIS — I2119 ST elevation (STEMI) myocardial infarction involving other coronary artery of inferior wall: Secondary | ICD-10-CM

## 2011-10-13 DIAGNOSIS — I219 Acute myocardial infarction, unspecified: Secondary | ICD-10-CM

## 2011-10-13 DIAGNOSIS — E119 Type 2 diabetes mellitus without complications: Secondary | ICD-10-CM | POA: Insufficient documentation

## 2011-10-13 DIAGNOSIS — I2510 Atherosclerotic heart disease of native coronary artery without angina pectoris: Secondary | ICD-10-CM | POA: Insufficient documentation

## 2011-10-13 MED ORDER — TECHNETIUM TC 99M TETROFOSMIN IV KIT
11.0000 | PACK | Freq: Once | INTRAVENOUS | Status: AC | PRN
  Administered 2011-10-13: 11 via INTRAVENOUS

## 2011-10-13 MED ORDER — REGADENOSON 0.4 MG/5ML IV SOLN
0.4000 mg | Freq: Once | INTRAVENOUS | Status: AC
  Administered 2011-10-13: 0.4 mg via INTRAVENOUS

## 2011-10-13 MED ORDER — TECHNETIUM TC 99M TETROFOSMIN IV KIT
33.0000 | PACK | Freq: Once | INTRAVENOUS | Status: AC | PRN
  Administered 2011-10-13: 33 via INTRAVENOUS

## 2011-10-13 NOTE — Progress Notes (Signed)
[facility] SITE 3 NUCLEAR MED [address] [place] [address] [phone]  Cardiology Nuclear Med Study  Alicia Medina is a 78 y.o. female     MRN : [id number]     DOB: 04-Nov-1932  Procedure Date: 10/13/2011  Nuclear Med Background Indication for Stress Test:  Evaluation for Ischemia, Stent Patency and to Assess the LAD Lesion  History:  08/28/11 IWMI>VF>Stent-RCA, residual LAD 90%; 08/30/11 Echo:EF=50-55%, mild LVH. Cardiac Risk Factors: Hypertension, Lipids and NIDDM  Symptoms:  Fatigue, Nausea and Vomiting   Nuclear Pre-Procedure Caffeine/Decaff Intake:  None NPO After: 7:00am   Lungs:  clear O2 Sat: 99% on room air. IV 0.9% NS with Angio Cath:  20g  IV Site: R Antecubital  IV Started by:  Stanton Kidney, EMT-P  Chest Size (in):  36 Cup Size: DD  Height: 5\' 3"  (1.6 m)  Weight:  164 lb (74.39 kg)  BMI:  Body mass index is 29.05 kg/(m^2). Tech Comments:  CBG= 109 @ 6:45 am, per patient.    Nuclear Med Study 1 or 2 day study: 1 day  Stress Test Type:  Lexiscan  Reading MD: Cassell Clement, MD  Order Authorizing Provider:  Peter Swaziland, MD; Norma Fredrickson, NP  Resting Radionuclide: Technetium 88m Tetrofosmin  Resting Radionuclide Dose: 10.4 mCi   Stress Radionuclide:  Technetium 75m Tetrofosmin  Stress Radionuclide Dose: 33.0 mCi           Stress Protocol Rest HR: 77 Stress HR: 120  Rest BP: 138/74 Stress BP: 135/69  Exercise Time (min): n/a METS: n/a   Predicted Max HR: 144 bpm % Max HR: 83.33 bpm Rate Pressure Product: 52841   Dose of Adenosine (mg):  n/a Dose of Lexiscan: 0.4 mg  Dose of Atropine (mg): n/a Dose of Dobutamine: n/a mcg/kg/min (at max HR)  Stress Test Technologist: Smiley Houseman, CMA-N  Nuclear Technologist:  Domenic Polite, CNMT     Rest Procedure:  Myocardial perfusion imaging was performed at rest 45 minutes following the intravenous administration of Technetium 52m Tetrofosmin.  Rest ECG: No acute changes.  Stress  Procedure:  The patient received IV Lexiscan 0.4 mg over 15-seconds.  Technetium 48m Tetrofosmin injected at 30-seconds.  There were nonspecific T-wave changes and occasional PVC's with Lexiscan bolus.  Quantitative spect images were obtained after a 45 minute delay.  Stress ECG: No significant ST segment change suggestive of ischemia.  QPS Raw Data Images:  Normal; no motion artifact; normal heart/lung ratio. Stress Images:  Normal homogeneous uptake in all areas of the myocardium. Rest Images:  Normal homogeneous uptake in all areas of the myocardium. Subtraction (SDS):  No evidence of ischemia. Transient Ischemic Dilatation (Normal <1.22):  0.93 Lung/Heart Ratio (Normal <0.45):  0.31  Quantitative Gated Spect Images QGS EDV:  64 ml QGS ESV:  15 ml  Impression Exercise Capacity:  Lexiscan with no exercise. BP Response:  Normal blood pressure response. Clinical Symptoms:  Mild chest pain/dyspnea. ECG Impression:  No significant ECG changes with Lexiscan. Comparison with Prior Nuclear Study: No previous nuclear study performed  Overall Impression:  Normal stress nuclear study.  No evidence of ischemia in the distribution of the known residual LAD lesion.  LV Ejection Fraction: 77%.  LV Wall Motion:  NL LV Function; NL Wall Motion  [employer]

## 2011-10-22 ENCOUNTER — Encounter (HOSPITAL_COMMUNITY)
Admission: RE | Admit: 2011-10-22 | Discharge: 2011-10-22 | Disposition: A | Discharge status: Home / Self Care | Payer: Medicare Other | Source: Ambulatory Visit | Attending: Cardiology | Admitting: Cardiology

## 2011-10-22 NOTE — Progress Notes (Signed)
Cardiac Rehab Medication Review by a Pharmacist  Does the patient  feel that his/her medications are working for him/her?  yes  Has the patient been experiencing any side effects to the medications prescribed?  no  Does the patient measure his/her own blood pressure or blood glucose at home?  yes   Does the patient have any problems obtaining medications due to transportation or finances?   no  Understanding of regimen: good Understanding of indications: good Potential of compliance: good    Pharmacist comments:     Ival Bible 10/22/2011 9:01 AM

## 2011-10-26 ENCOUNTER — Encounter (HOSPITAL_COMMUNITY)
Admission: RE | Admit: 2011-10-26 | Discharge: 2011-10-26 | Disposition: A | Discharge status: Home / Self Care | Payer: Medicare Other | Source: Ambulatory Visit | Attending: Cardiology | Admitting: Cardiology

## 2011-10-26 ENCOUNTER — Telehealth: Payer: Self-pay | Admitting: Nurse Practitioner

## 2011-10-26 DIAGNOSIS — E119 Type 2 diabetes mellitus without complications: Secondary | ICD-10-CM | POA: Insufficient documentation

## 2011-10-26 DIAGNOSIS — I10 Essential (primary) hypertension: Secondary | ICD-10-CM | POA: Insufficient documentation

## 2011-10-26 DIAGNOSIS — E669 Obesity, unspecified: Secondary | ICD-10-CM | POA: Insufficient documentation

## 2011-10-26 DIAGNOSIS — I472 Ventricular tachycardia, unspecified: Secondary | ICD-10-CM | POA: Insufficient documentation

## 2011-10-26 DIAGNOSIS — I5030 Unspecified diastolic (congestive) heart failure: Secondary | ICD-10-CM | POA: Insufficient documentation

## 2011-10-26 DIAGNOSIS — I442 Atrioventricular block, complete: Secondary | ICD-10-CM | POA: Insufficient documentation

## 2011-10-26 DIAGNOSIS — I2582 Chronic total occlusion of coronary artery: Secondary | ICD-10-CM | POA: Insufficient documentation

## 2011-10-26 DIAGNOSIS — Z7982 Long term (current) use of aspirin: Secondary | ICD-10-CM | POA: Insufficient documentation

## 2011-10-26 DIAGNOSIS — I4729 Other ventricular tachycardia: Secondary | ICD-10-CM | POA: Insufficient documentation

## 2011-10-26 DIAGNOSIS — I4901 Ventricular fibrillation: Secondary | ICD-10-CM | POA: Insufficient documentation

## 2011-10-26 DIAGNOSIS — I252 Old myocardial infarction: Secondary | ICD-10-CM | POA: Insufficient documentation

## 2011-10-26 DIAGNOSIS — Z9861 Coronary angioplasty status: Secondary | ICD-10-CM | POA: Insufficient documentation

## 2011-10-26 DIAGNOSIS — I509 Heart failure, unspecified: Secondary | ICD-10-CM | POA: Insufficient documentation

## 2011-10-26 DIAGNOSIS — Z5189 Encounter for other specified aftercare: Secondary | ICD-10-CM | POA: Insufficient documentation

## 2011-10-26 DIAGNOSIS — Z881 Allergy status to other antibiotic agents status: Secondary | ICD-10-CM | POA: Insufficient documentation

## 2011-10-26 DIAGNOSIS — E785 Hyperlipidemia, unspecified: Secondary | ICD-10-CM | POA: Insufficient documentation

## 2011-10-26 DIAGNOSIS — Z79899 Other long term (current) drug therapy: Secondary | ICD-10-CM | POA: Insufficient documentation

## 2011-10-26 DIAGNOSIS — I2510 Atherosclerotic heart disease of native coronary artery without angina pectoris: Secondary | ICD-10-CM | POA: Insufficient documentation

## 2011-10-26 DIAGNOSIS — Z8674 Personal history of sudden cardiac arrest: Secondary | ICD-10-CM | POA: Insufficient documentation

## 2011-10-26 NOTE — Telephone Encounter (Signed)
All Cardiac faxed to [facility]/Dr.jim little @ [phone] ROI signed   10/26/11/KM

## 2011-10-27 NOTE — Progress Notes (Signed)
Pt started cardiac rehab today.  Pt tolerated light exercise without difficulty. Telemetry normal sinus rhythm. Vital signs stable. Will continue to monitor the patient throughout  the program.  

## 2011-10-28 ENCOUNTER — Encounter (HOSPITAL_COMMUNITY)
Admission: RE | Admit: 2011-10-28 | Discharge: 2011-10-28 | Disposition: A | Discharge status: Home / Self Care | Payer: Medicare Other | Source: Ambulatory Visit | Attending: Cardiology | Admitting: Cardiology

## 2011-10-28 LAB — GLUCOSE, CAPILLARY
Glucose-Capillary: 140 mg/dL — ABNORMAL HIGH (ref 70–99)
Glucose-Capillary: 188 mg/dL — ABNORMAL HIGH (ref 70–99)

## 2011-10-30 ENCOUNTER — Encounter (HOSPITAL_COMMUNITY)
Admission: RE | Admit: 2011-10-30 | Discharge: 2011-10-30 | Disposition: A | Discharge status: Home / Self Care | Payer: Medicare Other | Source: Ambulatory Visit | Attending: Cardiology | Admitting: Cardiology

## 2011-10-30 LAB — GLUCOSE, CAPILLARY: Glucose-Capillary: 126 mg/dL — ABNORMAL HIGH (ref 70–99)

## 2011-11-02 ENCOUNTER — Encounter (HOSPITAL_COMMUNITY)
Admission: RE | Admit: 2011-11-02 | Discharge: 2011-11-02 | Disposition: A | Discharge status: Home / Self Care | Payer: Medicare Other | Source: Ambulatory Visit | Attending: Cardiology | Admitting: Cardiology

## 2011-11-04 ENCOUNTER — Encounter (HOSPITAL_COMMUNITY)
Admission: RE | Admit: 2011-11-04 | Discharge: 2011-11-04 | Disposition: A | Discharge status: Home / Self Care | Payer: Medicare Other | Source: Ambulatory Visit | Attending: Cardiology | Admitting: Cardiology

## 2011-11-04 NOTE — Progress Notes (Signed)
Reviewed home exercise with pt today.  Pt plans to walk and use elliptical at home for exercise.  Reviewed THR, pulse, RPE, sign and symptoms, and when to call 911 or MD.  Pt voiced understanding. Pelagia Iacobucci, MA, ACSM RCEP  

## 2011-11-06 ENCOUNTER — Encounter (HOSPITAL_COMMUNITY)
Admission: RE | Admit: 2011-11-06 | Discharge: 2011-11-06 | Disposition: A | Discharge status: Home / Self Care | Payer: Medicare Other | Source: Ambulatory Visit | Attending: Cardiology | Admitting: Cardiology

## 2011-11-09 ENCOUNTER — Encounter (HOSPITAL_COMMUNITY)
Admission: RE | Admit: 2011-11-09 | Discharge: 2011-11-09 | Disposition: A | Discharge status: Home / Self Care | Payer: Medicare Other | Source: Ambulatory Visit | Attending: Cardiology | Admitting: Cardiology

## 2011-11-11 ENCOUNTER — Encounter (HOSPITAL_COMMUNITY): Payer: Medicare Other

## 2011-11-11 ENCOUNTER — Encounter (HOSPITAL_COMMUNITY)
Admission: RE | Admit: 2011-11-11 | Discharge: 2011-11-11 | Disposition: A | Discharge status: Home / Self Care | Payer: Medicare Other | Source: Ambulatory Visit | Attending: Cardiology | Admitting: Cardiology

## 2011-11-12 ENCOUNTER — Ambulatory Visit (INDEPENDENT_AMBULATORY_CARE_PROVIDER_SITE_OTHER): Payer: Medicare Other | Admitting: Cardiology

## 2011-11-12 ENCOUNTER — Encounter: Payer: Self-pay | Admitting: Cardiology

## 2011-11-12 ENCOUNTER — Other Ambulatory Visit (INDEPENDENT_AMBULATORY_CARE_PROVIDER_SITE_OTHER): Payer: Medicare Other

## 2011-11-12 VITALS — BP 140/62 | HR 80 | Ht 63.0 in | Wt 162.0 lb

## 2011-11-12 DIAGNOSIS — I2510 Atherosclerotic heart disease of native coronary artery without angina pectoris: Secondary | ICD-10-CM | POA: Insufficient documentation

## 2011-11-12 DIAGNOSIS — E1169 Type 2 diabetes mellitus with other specified complication: Secondary | ICD-10-CM | POA: Insufficient documentation

## 2011-11-12 DIAGNOSIS — I10 Essential (primary) hypertension: Secondary | ICD-10-CM

## 2011-11-12 DIAGNOSIS — I2119 ST elevation (STEMI) myocardial infarction involving other coronary artery of inferior wall: Secondary | ICD-10-CM

## 2011-11-12 DIAGNOSIS — E785 Hyperlipidemia, unspecified: Secondary | ICD-10-CM

## 2011-11-12 DIAGNOSIS — E119 Type 2 diabetes mellitus without complications: Secondary | ICD-10-CM

## 2011-11-12 LAB — BASIC METABOLIC PANEL
BUN: 16 mg/dL (ref 6–23)
CO2: 27 mEq/L (ref 19–32)
Calcium: 9.6 mg/dL (ref 8.4–10.5)
Chloride: 105 mEq/L (ref 96–112)
Creatinine, Ser: 0.8 mg/dL (ref 0.4–1.2)
GFR: 69.91 mL/min (ref >60.00)
Glucose, Bld: 161 mg/dL — ABNORMAL HIGH (ref 70–99)
Potassium: 4.9 mEq/L (ref 3.5–5.1)
Sodium: 141 mEq/L (ref 135–145)

## 2011-11-12 LAB — LIPID PANEL
Cholesterol: 110 mg/dL (ref 0–200)
HDL: 47.9 mg/dL (ref >39.00)
LDL Cholesterol: 46 mg/dL (ref 0–99)
Total CHOL/HDL Ratio: 2
Triglycerides: 79 mg/dL (ref 0.0–149.0)
VLDL: 15.8 mg/dL (ref 0.0–40.0)

## 2011-11-12 LAB — HEPATIC FUNCTION PANEL
ALT: 24 U/L (ref 0–35)
AST: 23 U/L (ref 0–37)
Albumin: 4.2 g/dL (ref 3.5–5.2)
Alkaline Phosphatase: 45 U/L (ref 39–117)
Bilirubin, Direct: 0 mg/dL (ref 0.0–0.3)
Total Bilirubin: 0.5 mg/dL (ref 0.3–1.2)
Total Protein: 7.8 g/dL (ref 6.0–8.3)

## 2011-11-12 NOTE — Assessment & Plan Note (Signed)
We will check fasting lab work today. Continue on Crestor.

## 2011-11-12 NOTE — Progress Notes (Signed)
Alicia Medina Date of Birth: 07-07-1933 Medical Record #[id number]  History of Present Illness: Alicia Medina is seen today for a follow up visit.  She has had an inferior MI treated with BMS to the RCA. She required defibrillation multiple times and her hospitalization was complicated by cardiogenic shock, VF, VDRF and CHB requiring a temporary pacemaker. She had abrupt occlusion of the RCA stent that was treated with a 2nd MBS to the RCA. She is on Brilinta and does have residual LAD disease ostially. Initially this was felt to be more severe but on subsequent review we felt that this was most likely 50% range.  She continues to do very well. She is participating in cardiac rehabilitation. No chest pain but never had any chest pain. Not short of breath. Tolerating her medicines. Not dizzy or lightheaded.   Current Outpatient Prescriptions on File Prior to Visit  Medication Sig Dispense Refill  . aspirin 81 MG chewable tablet Chew 1 tablet (81 mg total) by mouth daily.      . furosemide (LASIX) 40 MG tablet Take 40 mg by mouth as needed.      Marland Kitchen glimepiride (AMARYL) 2 MG tablet Take 2 mg by mouth daily before breakfast.      . levothyroxine (SYNTHROID, LEVOTHROID) 150 MCG tablet Take 150 mcg by mouth daily.      Marland Kitchen losartan (COZAAR) 25 MG tablet Take 25 mg by mouth daily.      . metformin (FORTAMET) 500 MG (OSM) 24 hr tablet Take 500 mg by mouth daily with breakfast.      . metoprolol (LOPRESSOR) 50 MG tablet Take 1 tablet (50 mg total) by mouth 2 (two) times daily.  60 tablet  5  . potassium chloride SA (K-DUR,KLOR-CON) 20 MEQ tablet Take 1 tablet (20 mEq total) by mouth daily.  30 tablet  5  . rosuvastatin (CRESTOR) 40 MG tablet Take 1 tablet (40 mg total) by mouth daily.  30 tablet  5  . Ticagrelor (BRILINTA) 90 MG TABS tablet Take 1 tablet (90 mg total) by mouth 2 (two) times daily.  60 tablet  11    Allergies  Allergen Reactions  . Lisinopril Cough  . Tetracyclines & Related Nausea Only   Severe stomach cramps    Past Medical History  Diagnosis Date  . Hypothyroidism, postsurgical   . Hypertension   . Hyperlipidemia   . Diabetes mellitus   . CAD (coronary artery disease) Feb 2013    Inferior STEMI with BMS to the RCA, complicated by cardiogenic shock, VF, VDRF , temporary CHB and abrupt reocclusion of the RCA stent treated with a second BMS to the RCA. Has residual LAD disease. EF is 50%  . Diastolic heart failure   . Blood loss anemia     Past Surgical History  Procedure Date  . Total thyroidectomy October 2008  . Coronary stent placement Feb 2013    BMS to the RCA with abrupt reocclusion treated with 2nd stent    History  Smoking status  . Never Smoker   Smokeless tobacco  . Not on file    History  Alcohol Use  . 0.6 oz/week  . 1 Glasses of wine per week    Family History  Problem Relation Age of Onset  . Stroke Mother   . Cancer Father   . Stroke Father     Review of Systems: The review of systems is per the HPI.  All other systems were reviewed and are negative.  Physical Exam: BP 140/62  Pulse 80  Ht 5\' 3"  (1.6 m)  Wt 162 lb (73.483 kg)  BMI 28.70 kg/m2 Patient is very pleasant and in no acute distress. She looks great!. Skin is warm and dry. Color is normal.  HEENT is unremarkable. Normocephalic/atraumatic. PERRL. Sclera are nonicteric. Neck is supple. No masses. No JVD. Lungs are clear. Cardiac exam shows a regular rate and rhythm. Abdomen is soft. Extremities are without edema. Gait and ROM are intact. No gross neurologic deficits noted.  LABORATORY DATA: Stress Myoview study on 10/13/2011 showed no evidence of ischemia or infarction. Ejection fraction was 77%. There was specifically no ischemia in the LAD territory and surprisingly no evidence of ischemia or scar on the inferior wall. Assessment / Plan:

## 2011-11-12 NOTE — Patient Instructions (Signed)
Continue your current therapy.  We will check fasting lab work today.  I will see you again in 4 months.

## 2011-11-12 NOTE — Assessment & Plan Note (Addendum)
Based on her followup stress test there is no ischemia in LAD territory. We will continue medical management of her coronary disease with aggressive risk factor modification. We will continue dual antiplatelet therapy for at least one year. Continue statin therapy. Continue metoprolol. I recommended she stay off of hormone replacement therapy. I'll followup again in 4 months.

## 2011-11-13 ENCOUNTER — Encounter (HOSPITAL_COMMUNITY)
Admission: RE | Admit: 2011-11-13 | Discharge: 2011-11-13 | Disposition: A | Discharge status: Home / Self Care | Payer: Medicare Other | Source: Ambulatory Visit | Attending: Cardiology | Admitting: Cardiology

## 2011-11-13 LAB — GLUCOSE, CAPILLARY: Glucose-Capillary: 143 mg/dL — ABNORMAL HIGH (ref 70–99)

## 2011-11-16 ENCOUNTER — Other Ambulatory Visit: Payer: Self-pay

## 2011-11-16 ENCOUNTER — Telehealth: Payer: Self-pay | Admitting: Cardiology

## 2011-11-16 ENCOUNTER — Encounter (HOSPITAL_COMMUNITY)
Admission: RE | Admit: 2011-11-16 | Discharge: 2011-11-16 | Disposition: A | Discharge status: Home / Self Care | Payer: Medicare Other | Source: Ambulatory Visit | Attending: Cardiology | Admitting: Cardiology

## 2011-11-16 DIAGNOSIS — I2510 Atherosclerotic heart disease of native coronary artery without angina pectoris: Secondary | ICD-10-CM

## 2011-11-16 DIAGNOSIS — E785 Hyperlipidemia, unspecified: Secondary | ICD-10-CM

## 2011-11-16 NOTE — Telephone Encounter (Signed)
Pt rtn call from msg left around lunch time , however th msg is closed so I wasn't sure you got the message to call her after 3p, so I wanted to leave another msg

## 2011-11-16 NOTE — Telephone Encounter (Signed)
New message:  Pt was called about her lab results and was asked to call back.  Please call her with information.  Pt will be home after 3:00.

## 2011-11-16 NOTE — Telephone Encounter (Signed)
Patient called lab results given.Repeat lipids,hepatic,bmet in 6 months.

## 2011-11-16 NOTE — Telephone Encounter (Signed)
Patient called lab results given.Repeat lipids,hepatic,bmet in 6 months. 

## 2011-11-18 ENCOUNTER — Encounter (HOSPITAL_COMMUNITY)
Admission: RE | Admit: 2011-11-18 | Discharge: 2011-11-18 | Disposition: A | Discharge status: Home / Self Care | Payer: Medicare Other | Source: Ambulatory Visit | Attending: Cardiology | Admitting: Cardiology

## 2011-11-20 ENCOUNTER — Encounter (HOSPITAL_COMMUNITY)
Admission: RE | Admit: 2011-11-20 | Discharge: 2011-11-20 | Disposition: A | Discharge status: Home / Self Care | Payer: Medicare Other | Source: Ambulatory Visit | Attending: Cardiology | Admitting: Cardiology

## 2011-11-23 ENCOUNTER — Encounter (HOSPITAL_COMMUNITY)
Admission: RE | Admit: 2011-11-23 | Discharge: 2011-11-23 | Disposition: A | Discharge status: Home / Self Care | Payer: Medicare Other | Source: Ambulatory Visit | Attending: Cardiology | Admitting: Cardiology

## 2011-11-25 ENCOUNTER — Encounter (HOSPITAL_COMMUNITY)
Admission: RE | Admit: 2011-11-25 | Discharge: 2011-11-25 | Disposition: A | Discharge status: Home / Self Care | Payer: Medicare Other | Source: Ambulatory Visit | Attending: Cardiology | Admitting: Cardiology

## 2011-11-25 DIAGNOSIS — Z8674 Personal history of sudden cardiac arrest: Secondary | ICD-10-CM | POA: Insufficient documentation

## 2011-11-25 DIAGNOSIS — Z79899 Other long term (current) drug therapy: Secondary | ICD-10-CM | POA: Insufficient documentation

## 2011-11-25 DIAGNOSIS — I509 Heart failure, unspecified: Secondary | ICD-10-CM | POA: Insufficient documentation

## 2011-11-25 DIAGNOSIS — Z5189 Encounter for other specified aftercare: Secondary | ICD-10-CM | POA: Insufficient documentation

## 2011-11-25 DIAGNOSIS — I5030 Unspecified diastolic (congestive) heart failure: Secondary | ICD-10-CM | POA: Insufficient documentation

## 2011-11-25 DIAGNOSIS — Z9861 Coronary angioplasty status: Secondary | ICD-10-CM | POA: Insufficient documentation

## 2011-11-25 DIAGNOSIS — E669 Obesity, unspecified: Secondary | ICD-10-CM | POA: Insufficient documentation

## 2011-11-25 DIAGNOSIS — Z7982 Long term (current) use of aspirin: Secondary | ICD-10-CM | POA: Insufficient documentation

## 2011-11-25 DIAGNOSIS — I472 Ventricular tachycardia, unspecified: Secondary | ICD-10-CM | POA: Insufficient documentation

## 2011-11-25 DIAGNOSIS — E785 Hyperlipidemia, unspecified: Secondary | ICD-10-CM | POA: Insufficient documentation

## 2011-11-25 DIAGNOSIS — I4901 Ventricular fibrillation: Secondary | ICD-10-CM | POA: Insufficient documentation

## 2011-11-25 DIAGNOSIS — Z881 Allergy status to other antibiotic agents status: Secondary | ICD-10-CM | POA: Insufficient documentation

## 2011-11-25 DIAGNOSIS — I252 Old myocardial infarction: Secondary | ICD-10-CM | POA: Insufficient documentation

## 2011-11-25 DIAGNOSIS — I2582 Chronic total occlusion of coronary artery: Secondary | ICD-10-CM | POA: Insufficient documentation

## 2011-11-25 DIAGNOSIS — I442 Atrioventricular block, complete: Secondary | ICD-10-CM | POA: Insufficient documentation

## 2011-11-25 DIAGNOSIS — I2510 Atherosclerotic heart disease of native coronary artery without angina pectoris: Secondary | ICD-10-CM | POA: Insufficient documentation

## 2011-11-25 DIAGNOSIS — I4729 Other ventricular tachycardia: Secondary | ICD-10-CM | POA: Insufficient documentation

## 2011-11-25 DIAGNOSIS — I10 Essential (primary) hypertension: Secondary | ICD-10-CM | POA: Insufficient documentation

## 2011-11-25 DIAGNOSIS — E119 Type 2 diabetes mellitus without complications: Secondary | ICD-10-CM | POA: Insufficient documentation

## 2011-11-27 ENCOUNTER — Encounter (HOSPITAL_COMMUNITY)
Admission: RE | Admit: 2011-11-27 | Discharge: 2011-11-27 | Disposition: A | Discharge status: Home / Self Care | Payer: Medicare Other | Source: Ambulatory Visit | Attending: Cardiology | Admitting: Cardiology

## 2011-11-30 ENCOUNTER — Encounter (HOSPITAL_COMMUNITY)
Admission: RE | Admit: 2011-11-30 | Discharge: 2011-11-30 | Disposition: A | Discharge status: Home / Self Care | Payer: Medicare Other | Source: Ambulatory Visit | Attending: Cardiology | Admitting: Cardiology

## 2011-12-02 ENCOUNTER — Encounter (HOSPITAL_COMMUNITY)
Admission: RE | Admit: 2011-12-02 | Discharge: 2011-12-02 | Disposition: A | Discharge status: Home / Self Care | Payer: Medicare Other | Source: Ambulatory Visit | Attending: Cardiology | Admitting: Cardiology

## 2011-12-04 ENCOUNTER — Encounter (HOSPITAL_COMMUNITY)
Admission: RE | Admit: 2011-12-04 | Discharge: 2011-12-04 | Disposition: A | Discharge status: Home / Self Care | Payer: Medicare Other | Source: Ambulatory Visit | Attending: Cardiology | Admitting: Cardiology

## 2011-12-07 ENCOUNTER — Encounter (HOSPITAL_COMMUNITY)
Admission: RE | Admit: 2011-12-07 | Discharge: 2011-12-07 | Disposition: A | Discharge status: Home / Self Care | Payer: Medicare Other | Source: Ambulatory Visit | Attending: Cardiology | Admitting: Cardiology

## 2011-12-09 ENCOUNTER — Encounter (HOSPITAL_COMMUNITY)
Admission: RE | Admit: 2011-12-09 | Discharge: 2011-12-09 | Disposition: A | Discharge status: Home / Self Care | Payer: Medicare Other | Source: Ambulatory Visit | Attending: Cardiology | Admitting: Cardiology

## 2011-12-11 ENCOUNTER — Encounter (HOSPITAL_COMMUNITY)
Admission: RE | Admit: 2011-12-11 | Discharge: 2011-12-11 | Disposition: A | Discharge status: Home / Self Care | Payer: Medicare Other | Source: Ambulatory Visit | Attending: Cardiology | Admitting: Cardiology

## 2011-12-14 ENCOUNTER — Encounter (HOSPITAL_COMMUNITY)
Admission: RE | Admit: 2011-12-14 | Discharge: 2011-12-14 | Disposition: A | Discharge status: Home / Self Care | Payer: Medicare Other | Source: Ambulatory Visit | Attending: Cardiology | Admitting: Cardiology

## 2011-12-16 ENCOUNTER — Encounter (HOSPITAL_COMMUNITY)
Admission: RE | Admit: 2011-12-16 | Discharge: 2011-12-16 | Disposition: A | Discharge status: Home / Self Care | Payer: Medicare Other | Source: Ambulatory Visit | Attending: Cardiology | Admitting: Cardiology

## 2011-12-16 NOTE — Progress Notes (Signed)
Pt with extensive travel plans into June.  Pt given the option to graduate early on Friday 5/24.  Pt to think about it and let rehab staff know her intentions Pt has completed 23 sessions.

## 2011-12-18 ENCOUNTER — Encounter (HOSPITAL_COMMUNITY)
Admission: RE | Admit: 2011-12-18 | Discharge: 2011-12-18 | Disposition: A | Discharge status: Home / Self Care | Payer: Medicare Other | Source: Ambulatory Visit | Attending: Cardiology | Admitting: Cardiology

## 2011-12-18 NOTE — Progress Notes (Signed)
Alicia Medina is graduating from the program early to vacation plans for the summer. Medications reconciled.

## 2011-12-23 ENCOUNTER — Encounter (HOSPITAL_COMMUNITY): Payer: Medicare Other

## 2011-12-25 ENCOUNTER — Encounter (HOSPITAL_COMMUNITY): Payer: Medicare Other

## 2011-12-28 ENCOUNTER — Encounter (HOSPITAL_COMMUNITY): Payer: Medicare Other

## 2011-12-30 ENCOUNTER — Encounter (HOSPITAL_COMMUNITY): Payer: Medicare Other

## 2012-01-01 ENCOUNTER — Encounter (HOSPITAL_COMMUNITY): Payer: Medicare Other

## 2012-01-04 ENCOUNTER — Encounter (HOSPITAL_COMMUNITY): Payer: Medicare Other

## 2012-01-06 ENCOUNTER — Encounter (HOSPITAL_COMMUNITY): Payer: Medicare Other

## 2012-01-08 ENCOUNTER — Encounter (HOSPITAL_COMMUNITY): Payer: Medicare Other

## 2012-01-11 ENCOUNTER — Encounter (HOSPITAL_COMMUNITY): Payer: Medicare Other

## 2012-01-13 ENCOUNTER — Encounter (HOSPITAL_COMMUNITY): Payer: Medicare Other

## 2012-01-15 ENCOUNTER — Encounter (HOSPITAL_COMMUNITY): Payer: Medicare Other

## 2012-01-18 ENCOUNTER — Encounter (HOSPITAL_COMMUNITY): Payer: Medicare Other

## 2012-01-20 ENCOUNTER — Encounter (HOSPITAL_COMMUNITY): Payer: Medicare Other

## 2012-01-22 ENCOUNTER — Encounter (HOSPITAL_COMMUNITY): Payer: Medicare Other

## 2012-01-25 ENCOUNTER — Encounter (HOSPITAL_COMMUNITY): Payer: Medicare Other

## 2012-01-27 ENCOUNTER — Other Ambulatory Visit: Payer: Self-pay | Admitting: Obstetrics & Gynecology

## 2012-01-27 ENCOUNTER — Encounter (HOSPITAL_COMMUNITY): Payer: Medicare Other

## 2012-01-27 DIAGNOSIS — Z1231 Encounter for screening mammogram for malignant neoplasm of breast: Secondary | ICD-10-CM

## 2012-01-29 ENCOUNTER — Encounter (HOSPITAL_COMMUNITY): Payer: Medicare Other

## 2012-02-09 ENCOUNTER — Ambulatory Visit
Admission: RE | Admit: 2012-02-09 | Discharge: 2012-02-09 | Disposition: A | Discharge status: Home / Self Care | Payer: 59 | Source: Ambulatory Visit | Attending: Obstetrics & Gynecology | Admitting: Obstetrics & Gynecology

## 2012-02-09 DIAGNOSIS — Z1231 Encounter for screening mammogram for malignant neoplasm of breast: Secondary | ICD-10-CM

## 2012-02-29 ENCOUNTER — Other Ambulatory Visit: Payer: Self-pay | Admitting: Cardiovascular Disease

## 2012-02-29 ENCOUNTER — Other Ambulatory Visit: Payer: Self-pay | Admitting: Cardiology

## 2012-02-29 MED ORDER — METOPROLOL TARTRATE 50 MG PO TABS: 50.0000 mg | ORAL_TABLET | Freq: Two times a day (BID) | ORAL | Status: DC

## 2012-02-29 MED ORDER — ROSUVASTATIN CALCIUM 40 MG PO TABS: 40.0000 mg | ORAL_TABLET | Freq: Every day | ORAL | Status: DC

## 2012-02-29 MED ORDER — POTASSIUM CHLORIDE CRYS ER 20 MEQ PO TBCR: 20.0000 meq | EXTENDED_RELEASE_TABLET | Freq: Every day | ORAL | Status: DC

## 2012-03-03 ENCOUNTER — Telehealth: Payer: Self-pay | Admitting: Cardiology

## 2012-03-03 NOTE — Telephone Encounter (Signed)
Patient called no answer.Left message on personal voice mail no antibiotic needed for dental cleaning.

## 2012-03-03 NOTE — Telephone Encounter (Signed)
Pt had heart attack and has dentla cleaning on Monday, her dental office called her and needs to know if pt needs to be medicated prior? If so pt uses [facility], if not in ok to leave her a message

## 2012-03-04 ENCOUNTER — Other Ambulatory Visit: Payer: Self-pay

## 2012-03-04 MED ORDER — METOPROLOL TARTRATE 50 MG PO TABS: 50.0000 mg | ORAL_TABLET | Freq: Two times a day (BID) | ORAL | Status: DC

## 2012-03-17 ENCOUNTER — Ambulatory Visit: Payer: 59 | Admitting: Cardiology

## 2012-03-23 ENCOUNTER — Ambulatory Visit (INDEPENDENT_AMBULATORY_CARE_PROVIDER_SITE_OTHER): Payer: Medicare Other | Admitting: Cardiology

## 2012-03-23 ENCOUNTER — Encounter: Payer: Self-pay | Admitting: Cardiology

## 2012-03-23 VITALS — BP 142/66 | HR 88 | Ht 63.5 in | Wt 162.8 lb

## 2012-03-23 DIAGNOSIS — E119 Type 2 diabetes mellitus without complications: Secondary | ICD-10-CM

## 2012-03-23 DIAGNOSIS — I2510 Atherosclerotic heart disease of native coronary artery without angina pectoris: Secondary | ICD-10-CM

## 2012-03-23 DIAGNOSIS — I252 Old myocardial infarction: Secondary | ICD-10-CM

## 2012-03-23 DIAGNOSIS — E785 Hyperlipidemia, unspecified: Secondary | ICD-10-CM

## 2012-03-23 NOTE — Patient Instructions (Signed)
Continue your current medication and exercise  I will see you again in 6 months

## 2012-03-23 NOTE — Progress Notes (Signed)
Alicia Medina Date of Birth: [birth year] Medical Record #[id number]  History of Present Illness: Alicia Medina is seen today for a follow up visit.  She has had an inferior MI treated with BMS to the RCA in February [address]. She required defibrillation multiple times and her hospitalization was complicated by cardiogenic shock, VF, VDRF and CHB requiring a temporary pacemaker. She had abrupt occlusion of the RCA stent that was treated with a 2nd MBS to the RCA. She is on Brilinta and does have residual 50% LAD disease ostially.followup nuclear stress testing in March showed normal perfusion throughout with ejection fraction of 76%. On followup today the patient has no significant cardiac symptoms. She will occasionally feel a twinge in her chest. [facility]. She complains of frequent hot flashes since her hormonal therapy was discontinued. She has some small subcutaneous bruises on her arms and legs. She reports her last A1c was 7.6%.  Current Outpatient Prescriptions on File Prior to Visit  Medication Sig Dispense Refill  . aspirin 81 MG chewable tablet Chew 1 tablet (81 mg total) by mouth daily.      . furosemide (LASIX) 40 MG tablet Take 40 mg by mouth as needed.      Marland Kitchen glimepiride (AMARYL) 2 MG tablet Take 2 mg by mouth daily before breakfast.      . levothyroxine (SYNTHROID, LEVOTHROID) 150 MCG tablet Take 150 mcg by mouth daily.      Marland Kitchen losartan (COZAAR) 25 MG tablet Take 25 mg by mouth daily.      . metformin (FORTAMET) 500 MG (OSM) 24 hr tablet Take 500 mg by mouth daily with breakfast.      . metoprolol (LOPRESSOR) 50 MG tablet Take 1 tablet (50 mg total) by mouth 2 (two) times daily.  60 tablet  11  . potassium chloride SA (K-DUR,KLOR-CON) 20 MEQ tablet Take 1 tablet (20 mEq total) by mouth daily.  30 tablet  5  . rosuvastatin (CRESTOR) 40 MG tablet Take 1 tablet (40 mg total) by mouth daily.  30 tablet  5  . Ticagrelor (BRILINTA) 90 MG TABS tablet Take 1 tablet  (90 mg total) by mouth 2 (two) times daily.  60 tablet  11    Allergies  Allergen Reactions  . Lisinopril Cough  . Tetracyclines & Related Nausea Only    Severe stomach cramps    Past Medical History  Diagnosis Date  . Hypothyroidism, postsurgical   . Hypertension   . Hyperlipidemia   . Diabetes mellitus   . CAD (coronary artery disease) Feb 2013    Inferior STEMI with BMS to the RCA, complicated by cardiogenic shock, VF, VDRF , temporary CHB and abrupt reocclusion of the RCA stent treated with a second BMS to the RCA. Has residual LAD disease. EF is 50%  . Diastolic heart failure   . Blood loss anemia     Past Surgical History  Procedure Date  . Total thyroidectomy October 2008  . Coronary stent placement Feb 2013    BMS to the RCA with abrupt reocclusion treated with 2nd stent    History  Smoking status  . Never Smoker   Smokeless tobacco  . Not on file    History  Alcohol Use  . 0.6 oz/week  . 1 Glasses of wine per week    Family History  Problem Relation Age of Onset  . Stroke Mother   . Cancer Father   . Stroke Father  Review of Systems: The review of systems is per the HPI. She complains of frequent diarrhea related to history of irritable bowel. She has had significant hemorrhoids. She states she is due for colonoscopy. All other systems were reviewed and are negative.  Physical Exam: BP 142/66  Pulse 88  Ht 5' 3.5" (1.613 m)  Wt 162 lb 12.8 oz (73.846 kg)  BMI 28.39 kg/m2 Patient is very pleasant and in no acute distress.  Skin is warm and dry. Color is normal.  HEENT is unremarkable. Normocephalic/atraumatic. PERRL. Sclera are nonicteric. Neck is supple. No masses. No JVD. Lungs are clear. Cardiac exam shows a regular rate and rhythm. Abdomen is soft. Extremities are without edema. Gait and ROM are intact. No gross neurologic deficits noted.  LABORATORY DATA: From April 2013 total cholesterol 110, triglycerides 79, HDL 48, LDL 46.  Assessment  / Plan: 1. Status post acute inferior myocardial infarction in February of this year with multiple complications. Clinically she is doing very well. Followup nuclear stress test was normal. She will need to continue aspirin and Brilinta for one year from her infarct. I would delay colonoscopy until this point in time. Continue metoprolol and statin therapy. I will followup again in 6 months.  2. Hyperlipidemia, well controlled on Crestor.  3. Hypertension, controlled.  4. Diabetes mellitus type 2

## 2012-04-05 ENCOUNTER — Other Ambulatory Visit: Payer: Self-pay | Admitting: Cardiology

## 2012-04-05 MED ORDER — POTASSIUM CHLORIDE CRYS ER 20 MEQ PO TBCR: 20.0000 meq | EXTENDED_RELEASE_TABLET | Freq: Every day | ORAL | Status: DC

## 2012-05-17 ENCOUNTER — Other Ambulatory Visit (INDEPENDENT_AMBULATORY_CARE_PROVIDER_SITE_OTHER): Payer: Medicare Other

## 2012-05-17 DIAGNOSIS — I2510 Atherosclerotic heart disease of native coronary artery without angina pectoris: Secondary | ICD-10-CM

## 2012-05-17 DIAGNOSIS — E785 Hyperlipidemia, unspecified: Secondary | ICD-10-CM

## 2012-05-17 LAB — HEPATIC FUNCTION PANEL
AST: 26 U/L (ref 0–37)
Albumin: 3.8 g/dL (ref 3.5–5.2)
Alkaline Phosphatase: 44 U/L (ref 39–117)
Bilirubin, Direct: 0.1 mg/dL (ref 0.0–0.3)
Total Bilirubin: 0.5 mg/dL (ref 0.3–1.2)

## 2012-05-17 LAB — BASIC METABOLIC PANEL
BUN: 16 mg/dL (ref 6–23)
CO2: 29 mEq/L (ref 19–32)
Calcium: 9.3 mg/dL (ref 8.4–10.5)
Creatinine, Ser: 0.9 mg/dL (ref 0.4–1.2)

## 2012-05-17 LAB — LIPID PANEL
LDL Cholesterol: 39 mg/dL (ref 0–99)
Total CHOL/HDL Ratio: 2
Triglycerides: 80 mg/dL (ref 0.0–149.0)

## 2012-08-29 ENCOUNTER — Other Ambulatory Visit: Payer: Self-pay | Admitting: Cardiology

## 2012-08-29 ENCOUNTER — Other Ambulatory Visit: Payer: Self-pay

## 2012-08-29 MED ORDER — ROSUVASTATIN CALCIUM 40 MG PO TABS: 40.0000 mg | ORAL_TABLET | Freq: Every day | ORAL | Status: DC

## 2012-09-29 ENCOUNTER — Ambulatory Visit (INDEPENDENT_AMBULATORY_CARE_PROVIDER_SITE_OTHER): Payer: Medicare Other | Admitting: Cardiology

## 2012-09-29 ENCOUNTER — Encounter: Payer: Self-pay | Admitting: Cardiology

## 2012-09-29 VITALS — BP 118/62 | HR 81 | Ht 63.5 in | Wt 163.8 lb

## 2012-09-29 DIAGNOSIS — E119 Type 2 diabetes mellitus without complications: Secondary | ICD-10-CM

## 2012-09-29 DIAGNOSIS — I2510 Atherosclerotic heart disease of native coronary artery without angina pectoris: Secondary | ICD-10-CM

## 2012-09-29 DIAGNOSIS — E785 Hyperlipidemia, unspecified: Secondary | ICD-10-CM

## 2012-09-29 MED ORDER — ROSUVASTATIN CALCIUM 20 MG PO TABS: 20.0000 mg | ORAL_TABLET | Freq: Every day | ORAL | Status: DC

## 2012-09-29 MED ORDER — POTASSIUM CHLORIDE CRYS ER 20 MEQ PO TBCR: 20.0000 meq | EXTENDED_RELEASE_TABLET | Freq: Every day | ORAL | Status: DC

## 2012-09-29 NOTE — Patient Instructions (Addendum)
You may stop Brilinta. We will reduce crestor to 20 mg daily.  Continue your other medication  I will see you in 6 months with fasting lab.

## 2012-09-29 NOTE — Progress Notes (Signed)
Alicia Medina Date of Birth: April 02, 1933 Medical Record #[id number]  History of Present Illness: Alicia Medina is seen today for a follow up visit.  She has had an inferior MI treated with BMS to the RCA in February 13244. She required defibrillation multiple times and her hospitalization was complicated by cardiogenic shock, VF, VDRF and CHB requiring a temporary pacemaker. She had abrupt occlusion of the RCA stent that was treated with a 2nd MBS to the RCA.Followup nuclear stress testing in March showed normal perfusion throughout with ejection fraction of 76%. On followup today she denies any cardiac complaints of chest pain, SOB, or palpitations. She was started on Lantus for her diabetes.  Current Outpatient Prescriptions on File Prior to Visit  Medication Sig Dispense Refill  . aspirin 81 MG chewable tablet Chew 1 tablet (81 mg total) by mouth daily.      Marland Kitchen azithromycin (ZITHROMAX) 250 MG tablet       . glimepiride (AMARYL) 2 MG tablet Take 2 mg by mouth daily before breakfast.      . levothyroxine (SYNTHROID, LEVOTHROID) 150 MCG tablet Take 100 mcg by mouth daily.       Marland Kitchen losartan (COZAAR) 25 MG tablet Take 25 mg by mouth daily.      . metformin (FORTAMET) 500 MG (OSM) 24 hr tablet Take 500 mg by mouth daily with breakfast.      . metoprolol (LOPRESSOR) 50 MG tablet Take 1 tablet (50 mg total) by mouth 2 (two) times daily.  60 tablet  11   No current facility-administered medications on file prior to visit.    Allergies  Allergen Reactions  . Lisinopril Cough  . Tetracyclines & Related Nausea Only    Severe stomach cramps    Past Medical History  Diagnosis Date  . Hypothyroidism, postsurgical   . Hypertension   . Hyperlipidemia   . Diabetes mellitus   . CAD (coronary artery disease) Feb 2013    Inferior STEMI with BMS to the RCA, complicated by cardiogenic shock, VF, VDRF , temporary CHB and abrupt reocclusion of the RCA stent treated with a second BMS to the RCA. Has residual LAD  disease. EF is 50%  . Diastolic heart failure   . Blood loss anemia     Past Surgical History  Procedure Laterality Date  . Total thyroidectomy  October 2008  . Coronary stent placement  Feb 2013    BMS to the RCA with abrupt reocclusion treated with 2nd stent    History  Smoking status  . Never Smoker   Smokeless tobacco  . Not on file    History  Alcohol Use  . 0.6 oz/week  . 1 Glasses of wine per week    Family History  Problem Relation Age of Onset  . Stroke Mother   . Cancer Father   . Stroke Father     Review of Systems: The review of systems is per the HPI. She complains of frequent diarrhea related to history of irritable bowel. This is a chronic complaint. All other systems were reviewed and are negative.  Physical Exam: BP 118/62  Pulse 81  Ht 5' 3.5" (1.613 m)  Wt 163 lb 12.8 oz (74.299 kg)  BMI 28.56 kg/m2  SpO2 98% Patient is very pleasant and in no acute distress.  Skin is warm and dry. Color is normal.  HEENT is unremarkable. Normocephalic/atraumatic. PERRL. Sclera are nonicteric. Neck is supple. No masses. No JVD. Lungs are clear. Cardiac exam shows a regular  rate and rhythm. Abdomen is soft. Extremities are without edema. Gait and ROM are intact. No gross neurologic deficits noted.  LABORATORY DATA: Lab Results  Component Value Date   WBC 6.9 09/21/2011   HGB 12.0 09/21/2011   HCT 36.3 09/21/2011   PLT 320.0 09/21/2011   GLUCOSE 148* 05/17/2012   CHOL 97 05/17/2012   TRIG 80.0 05/17/2012   HDL 42.40 05/17/2012   LDLCALC 39 05/17/2012   ALT 27 05/17/2012   AST 26 05/17/2012   NA 139 05/17/2012   K 4.4 05/17/2012   CL 103 05/17/2012   CREATININE 0.9 05/17/2012   BUN 16 05/17/2012   CO2 29 05/17/2012   TSH 1.552 08/28/2011   INR 1.31 09/01/2011   HGBA1C 7.3* 08/28/2011   ECG shows NSR with rate of 84 bpm. Low voltage. Cannot rule out old septal infarct.  Assessment / Plan: 1. Status post acute inferior myocardial infarction in February 2013  with multiple complications. Clinically she is doing very well. Followup nuclear stress test was normal. She may stop Brilinta at this time. Continue ASA. Follow up in 6 months.  2. Hyperlipidemia, well controlled on Crestor. Will reduce dose to 20 mg daily.  3. Hypertension, controlled.  4. Diabetes mellitus type 2 now on insulin.

## 2013-01-01 IMAGING — CR DG CHEST 1V PORT
1 series · 1 of 1 positions shown · non-contrast
Comparison: Chest 04/29/2007.

CLINICAL DATA: Status post cardiac catheterization.

PORTABLE CHEST - 1 VIEW

[view not recorded]
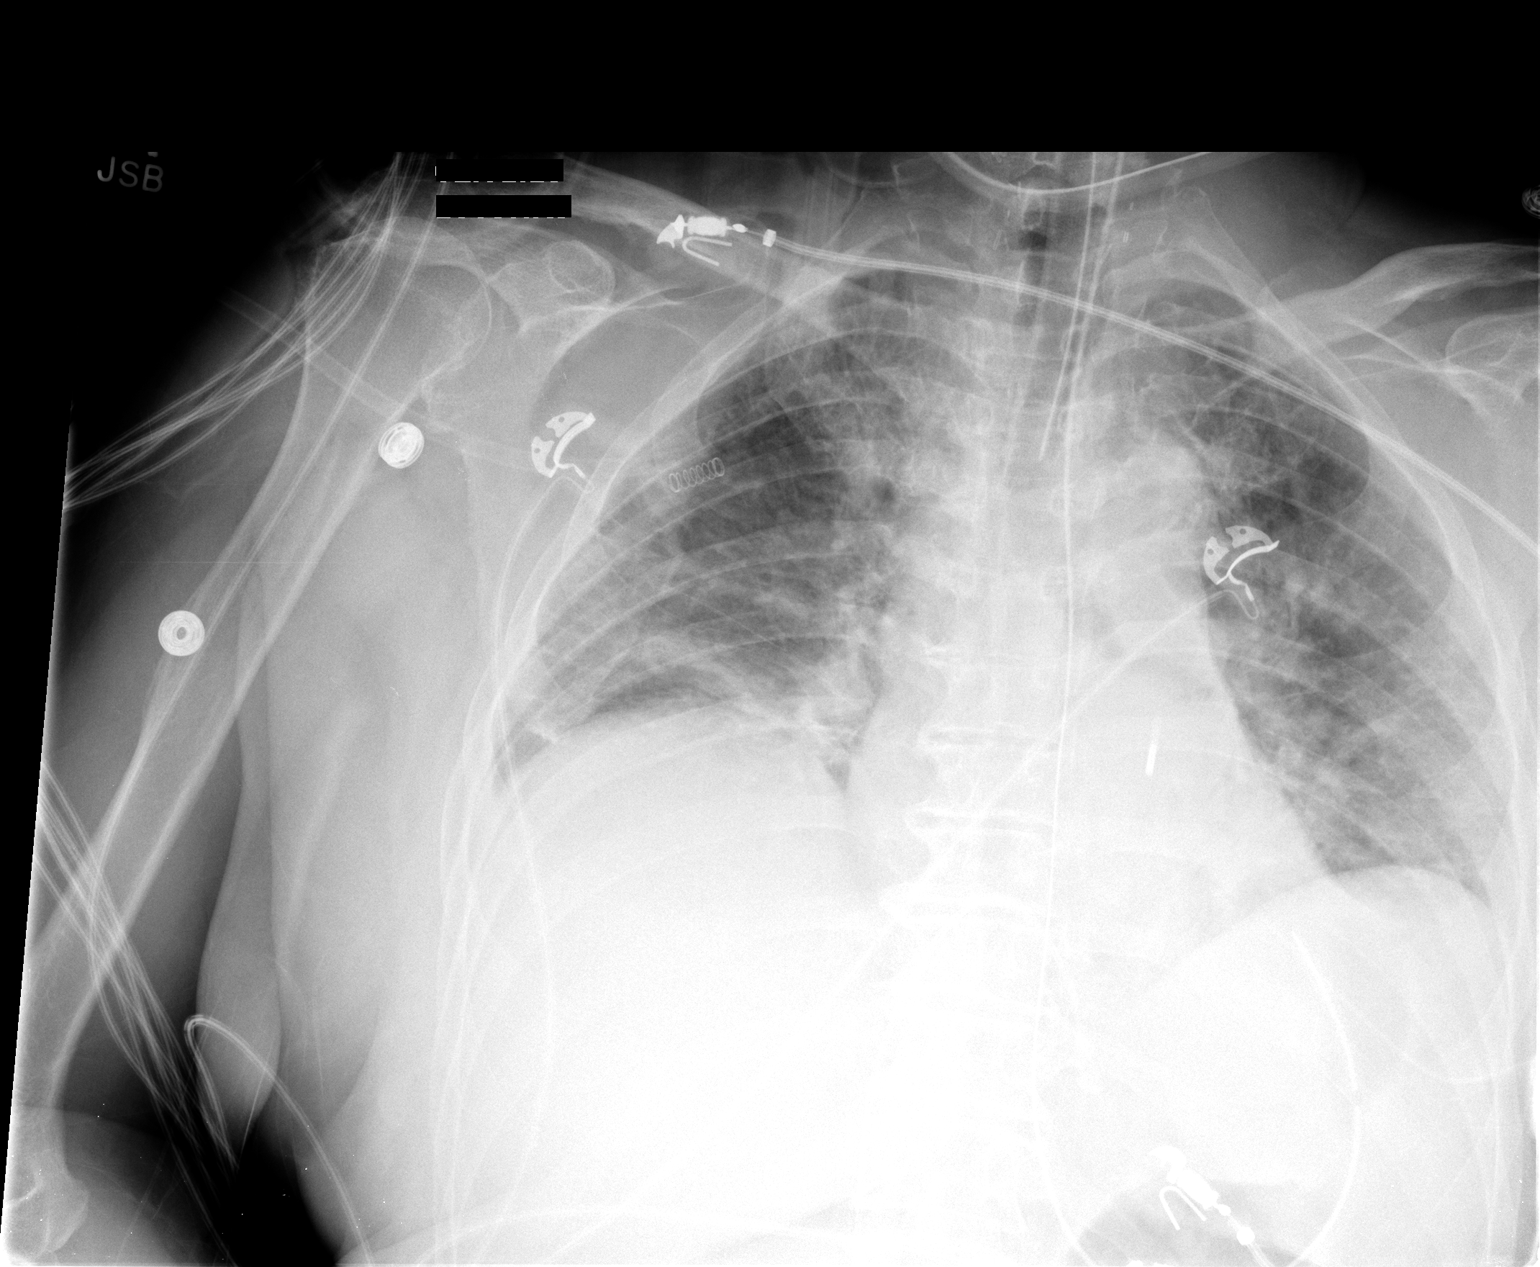

[1 of 1 positions shown; findings below may reference images not displayed]

FINDINGS: Endotracheal tube is in place with tip in good position
just below the clavicular heads.  NG tube has its tip in the fundus
of the stomach. Intra-aortic balloon pump is identified.  The
marker is 8.3 cm below the top of the aortic arch.  Lung volumes
are low with crowding of the bronchovascular structures.
Indistinctness of the pulmonary vasculature is compatible with mild
interstitial edema.  Heart size is normal.
IMPRESSION: 1.  Intra-aortic balloon pump is positioned with the marker 8.3 cm
below the top of the aortic arch.
2.  ET tube and NG tube in good position.
3.  Mild interstitial edema.

## 2013-01-05 IMAGING — CR DG CHEST 1V PORT
1 series · 1 of 1 positions shown · non-contrast
Comparison: Chest radiograph 08/31/2011

CLINICAL DATA: Acute pulmonary edema

PORTABLE CHEST - 1 VIEW

[view not recorded]
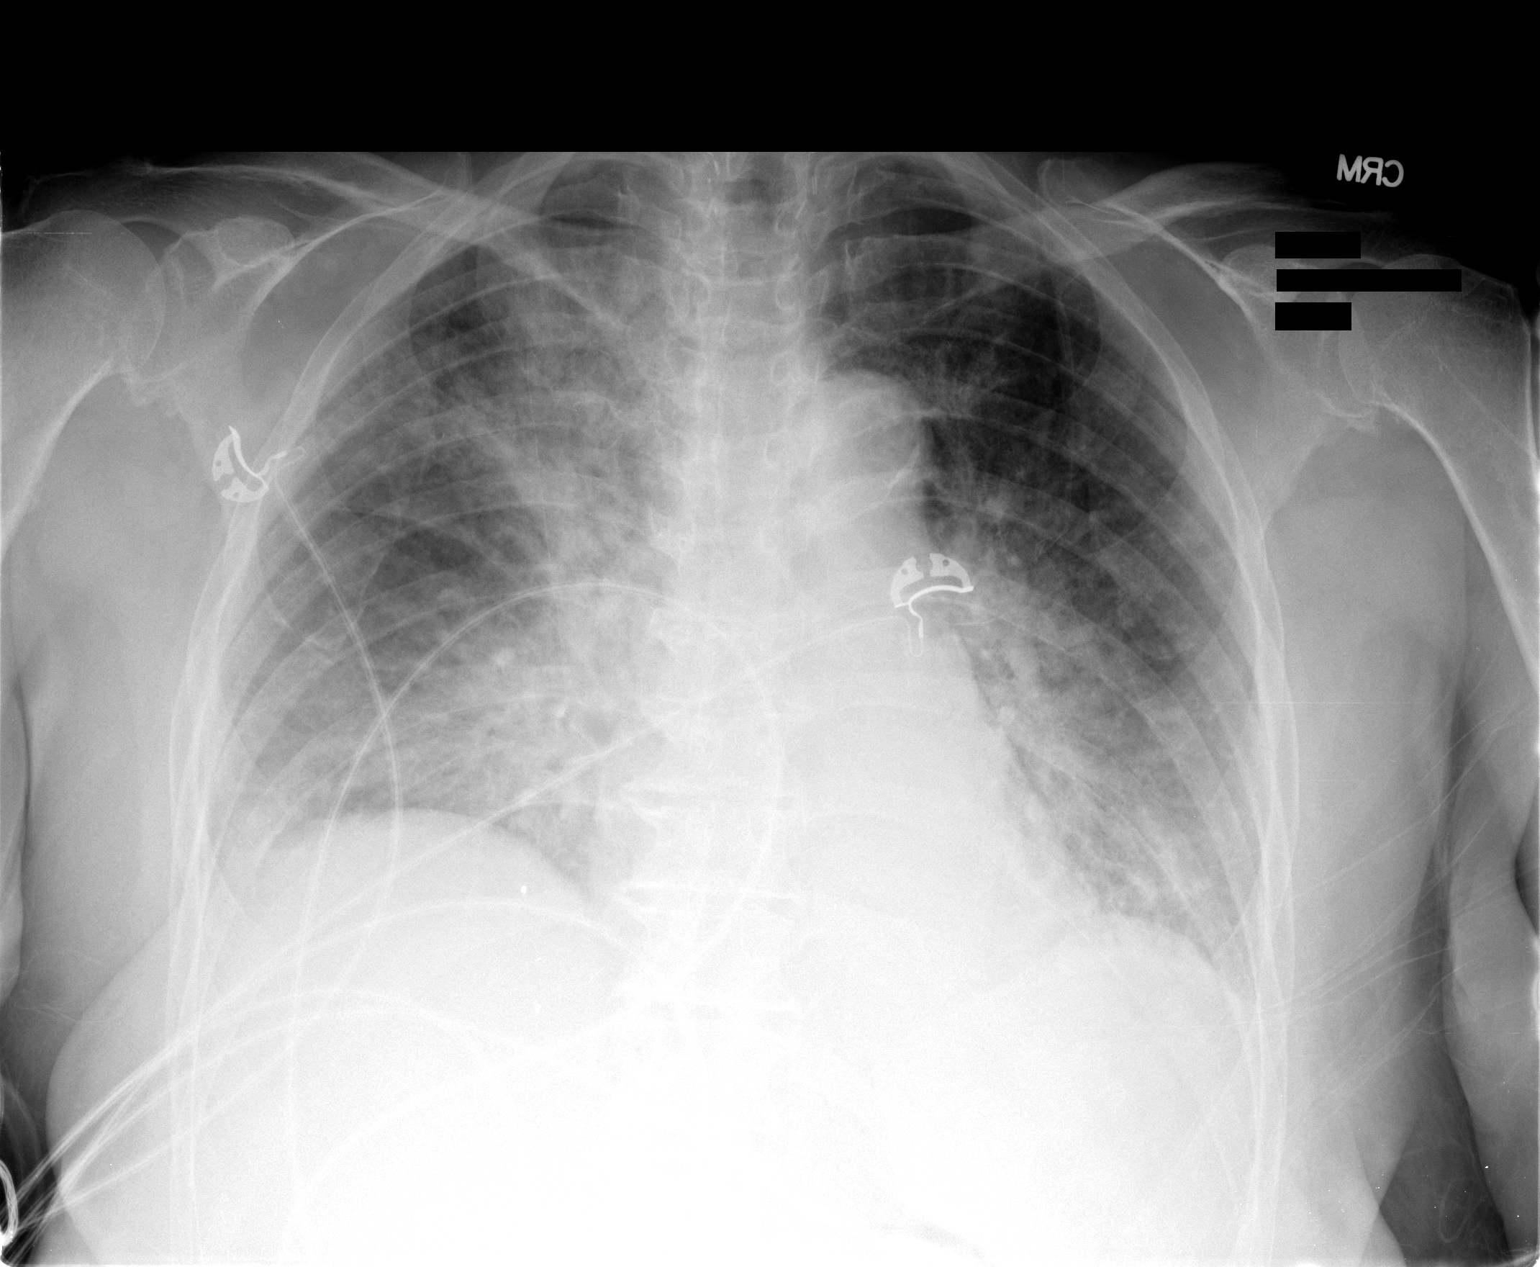

[1 of 1 positions shown; findings below may reference images not displayed]

FINDINGS: Stable cardiac silhouette.  There is perihilar air space
disease not changed from prior.  There is some focality in the
right upper lobe which is increased compared to prior.  Low lung
volumes.  No pneumothorax.
a
IMPRESSION: [1.  Perihilar air space disease is unchanged.

2.  Increased air space disease in the right upper lobe.

## 2013-01-12 ENCOUNTER — Other Ambulatory Visit: Payer: Self-pay | Admitting: Orthopedic Surgery

## 2013-01-16 ENCOUNTER — Other Ambulatory Visit: Payer: Self-pay | Admitting: Orthopedic Surgery

## 2013-01-19 ENCOUNTER — Encounter (HOSPITAL_BASED_OUTPATIENT_CLINIC_OR_DEPARTMENT_OTHER): Payer: Self-pay | Admitting: *Deleted

## 2013-01-19 ENCOUNTER — Other Ambulatory Visit: Payer: Self-pay

## 2013-01-19 DIAGNOSIS — Z1231 Encounter for screening mammogram for malignant neoplasm of breast: Secondary | ICD-10-CM

## 2013-01-19 NOTE — Progress Notes (Signed)
Pt had an mi 2/13 was very ill-doing very well now and recent visit with dr jordan3/6/14-ef 76% No problems since To come in for bmet

## 2013-01-20 ENCOUNTER — Encounter (HOSPITAL_BASED_OUTPATIENT_CLINIC_OR_DEPARTMENT_OTHER)
Admission: RE | Admit: 2013-01-20 | Discharge: 2013-01-20 | Disposition: A | Discharge status: Home / Self Care | Payer: Medicare Other | Source: Ambulatory Visit | Attending: Orthopedic Surgery | Admitting: Orthopedic Surgery

## 2013-01-20 LAB — BASIC METABOLIC PANEL
CO2: 28 mEq/L (ref 19–32)
Calcium: 9.4 mg/dL (ref 8.4–10.5)
Creatinine, Ser: 0.79 mg/dL (ref 0.50–1.10)
GFR calc Af Amer: >90 mL/min (ref >90)
GFR calc non Af Amer: 78 mL/min — ABNORMAL LOW (ref >90)
Sodium: 140 mEq/L (ref 135–145)

## 2013-01-24 ENCOUNTER — Encounter (HOSPITAL_BASED_OUTPATIENT_CLINIC_OR_DEPARTMENT_OTHER): Payer: Self-pay | Admitting: *Deleted

## 2013-01-25 ENCOUNTER — Encounter (HOSPITAL_BASED_OUTPATIENT_CLINIC_OR_DEPARTMENT_OTHER): Payer: Self-pay | Admitting: Orthopedic Surgery

## 2013-01-25 ENCOUNTER — Encounter (HOSPITAL_BASED_OUTPATIENT_CLINIC_OR_DEPARTMENT_OTHER)
Admission: RE | Disposition: A | Discharge status: Home / Self Care | Payer: Self-pay | Source: Ambulatory Visit | Attending: Orthopedic Surgery

## 2013-01-25 ENCOUNTER — Encounter (HOSPITAL_BASED_OUTPATIENT_CLINIC_OR_DEPARTMENT_OTHER): Payer: Self-pay | Admitting: Anesthesiology

## 2013-01-25 ENCOUNTER — Ambulatory Visit (HOSPITAL_BASED_OUTPATIENT_CLINIC_OR_DEPARTMENT_OTHER): Payer: Medicare Other | Admitting: Anesthesiology

## 2013-01-25 ENCOUNTER — Ambulatory Visit (HOSPITAL_BASED_OUTPATIENT_CLINIC_OR_DEPARTMENT_OTHER)
Admission: RE | Admit: 2013-01-25 | Discharge: 2013-01-25 | Disposition: A | Discharge status: Home / Self Care | Payer: Medicare Other | Source: Ambulatory Visit | Attending: Orthopedic Surgery | Admitting: Orthopedic Surgery

## 2013-01-25 DIAGNOSIS — Z79899 Other long term (current) drug therapy: Secondary | ICD-10-CM | POA: Insufficient documentation

## 2013-01-25 DIAGNOSIS — I5030 Unspecified diastolic (congestive) heart failure: Secondary | ICD-10-CM | POA: Insufficient documentation

## 2013-01-25 DIAGNOSIS — Z823 Family history of stroke: Secondary | ICD-10-CM | POA: Insufficient documentation

## 2013-01-25 DIAGNOSIS — Z888 Allergy status to other drugs, medicaments and biological substances status: Secondary | ICD-10-CM | POA: Insufficient documentation

## 2013-01-25 DIAGNOSIS — Z9861 Coronary angioplasty status: Secondary | ICD-10-CM | POA: Insufficient documentation

## 2013-01-25 DIAGNOSIS — E038 Other specified hypothyroidism: Secondary | ICD-10-CM | POA: Insufficient documentation

## 2013-01-25 DIAGNOSIS — I10 Essential (primary) hypertension: Secondary | ICD-10-CM | POA: Insufficient documentation

## 2013-01-25 DIAGNOSIS — H9190 Unspecified hearing loss, unspecified ear: Secondary | ICD-10-CM | POA: Insufficient documentation

## 2013-01-25 DIAGNOSIS — M6530 Trigger finger, unspecified finger: Secondary | ICD-10-CM | POA: Insufficient documentation

## 2013-01-25 DIAGNOSIS — E119 Type 2 diabetes mellitus without complications: Secondary | ICD-10-CM | POA: Insufficient documentation

## 2013-01-25 DIAGNOSIS — I252 Old myocardial infarction: Secondary | ICD-10-CM | POA: Insufficient documentation

## 2013-01-25 DIAGNOSIS — G5600 Carpal tunnel syndrome, unspecified upper limb: Secondary | ICD-10-CM | POA: Insufficient documentation

## 2013-01-25 DIAGNOSIS — Z809 Family history of malignant neoplasm, unspecified: Secondary | ICD-10-CM | POA: Insufficient documentation

## 2013-01-25 DIAGNOSIS — E785 Hyperlipidemia, unspecified: Secondary | ICD-10-CM | POA: Insufficient documentation

## 2013-01-25 DIAGNOSIS — I2510 Atherosclerotic heart disease of native coronary artery without angina pectoris: Secondary | ICD-10-CM | POA: Insufficient documentation

## 2013-01-25 DIAGNOSIS — M729 Fibroblastic disorder, unspecified: Secondary | ICD-10-CM | POA: Insufficient documentation

## 2013-01-25 HISTORY — PX: CARPAL TUNNEL RELEASE: SHX101

## 2013-01-25 HISTORY — DX: Presence of external hearing-aid: Z97.4

## 2013-01-25 HISTORY — PX: TRIGGER FINGER RELEASE: SHX641

## 2013-01-25 LAB — GLUCOSE, CAPILLARY: Glucose-Capillary: 129 mg/dL — ABNORMAL HIGH (ref 70–99)

## 2013-01-25 SURGERY — CARPAL TUNNEL RELEASE
Anesthesia: Monitor Anesthesia Care | Site: Hand | Laterality: Right | Wound class: Clean

## 2013-01-25 MED ORDER — CHLORHEXIDINE GLUCONATE 4 % EX LIQD: 60.0000 mL | Freq: Once | CUTANEOUS | Status: DC

## 2013-01-25 MED ORDER — LIDOCAINE HCL (PF) 0.5 % IJ SOLN
INTRAMUSCULAR | Status: DC | PRN
  Administered 2013-01-25: 35 mL via INTRAVENOUS

## 2013-01-25 MED ORDER — HYDROCODONE-ACETAMINOPHEN 5-325 MG PO TABS: 1.0000 | ORAL_TABLET | Freq: Four times a day (QID) | ORAL | Status: DC | PRN

## 2013-01-25 MED ORDER — CEFAZOLIN SODIUM-DEXTROSE 2-3 GM-% IV SOLR
2.0000 g | INTRAVENOUS | Status: AC
  Administered 2013-01-25: 2 g via INTRAVENOUS

## 2013-01-25 MED ORDER — LIDOCAINE HCL (CARDIAC) 20 MG/ML IV SOLN
INTRAVENOUS | Status: DC | PRN
  Administered 2013-01-25: 50 mg via INTRAVENOUS

## 2013-01-25 MED ORDER — BUPIVACAINE HCL (PF) 0.25 % IJ SOLN
INTRAMUSCULAR | Status: DC | PRN
  Administered 2013-01-25: 10 mL

## 2013-01-25 MED ORDER — LACTATED RINGERS IV SOLN
INTRAVENOUS | Status: DC
  Administered 2013-01-25: 10:00:00 via INTRAVENOUS

## 2013-01-25 MED ORDER — FENTANYL CITRATE 0.05 MG/ML IJ SOLN
INTRAMUSCULAR | Status: DC | PRN
  Administered 2013-01-25: 50 ug via INTRAVENOUS

## 2013-01-25 MED ORDER — PROPOFOL INFUSION 10 MG/ML OPTIME
INTRAVENOUS | Status: DC | PRN
  Administered 2013-01-25: 100 ug/kg/min via INTRAVENOUS

## 2013-01-25 SURGICAL SUPPLY — 52 items
BANDAGE COBAN STERILE 2 (GAUZE/BANDAGES/DRESSINGS) ×2 IMPLANT
BANDAGE GAUZE ELAST BULKY 4 IN (GAUZE/BANDAGES/DRESSINGS) ×2 IMPLANT
BLADE MINI RND TIP GREEN BEAV (BLADE) IMPLANT
BLADE SURG 15 STRL LF DISP TIS (BLADE) ×1 IMPLANT
BLADE SURG 15 STRL SS (BLADE) ×2
BNDG CMPR 9X4 STRL LF SNTH (GAUZE/BANDAGES/DRESSINGS) ×1
BNDG COHESIVE 1X5 TAN STRL LF (GAUZE/BANDAGES/DRESSINGS) IMPLANT
BNDG COHESIVE 3X5 TAN STRL LF (GAUZE/BANDAGES/DRESSINGS) ×2 IMPLANT
BNDG ESMARK 4X9 LF (GAUZE/BANDAGES/DRESSINGS) ×1 IMPLANT
CHLORAPREP W/TINT 26ML (MISCELLANEOUS) ×2 IMPLANT
CLOTH BEACON ORANGE TIMEOUT ST (SAFETY) ×2 IMPLANT
CORDS BIPOLAR (ELECTRODE) ×2 IMPLANT
COVER MAYO STAND STRL (DRAPES) ×2 IMPLANT
COVER TABLE BACK 60X90 (DRAPES) ×2 IMPLANT
CUFF TOURNIQUET SINGLE 18IN (TOURNIQUET CUFF) ×2 IMPLANT
DECANTER SPIKE VIAL GLASS SM (MISCELLANEOUS) IMPLANT
DRAIN PENROSE 1/2X12 LTX STRL (WOUND CARE) IMPLANT
DRAPE EXTREMITY T 121X128X90 (DRAPE) ×2 IMPLANT
DRAPE SURG 17X23 STRL (DRAPES) ×2 IMPLANT
DRSG KUZMA FLUFF (GAUZE/BANDAGES/DRESSINGS) ×2 IMPLANT
GAUZE XEROFORM 1X8 LF (GAUZE/BANDAGES/DRESSINGS) ×2 IMPLANT
GLOVE BIO SURGEON STRL SZ 6.5 (GLOVE) ×2 IMPLANT
GLOVE BIOGEL PI IND STRL 7.0 (GLOVE) IMPLANT
GLOVE BIOGEL PI IND STRL 8.5 (GLOVE) ×1 IMPLANT
GLOVE BIOGEL PI INDICATOR 7.0 (GLOVE) ×1
GLOVE BIOGEL PI INDICATOR 8.5 (GLOVE) ×1
GLOVE SURG ORTHO 8.0 STRL STRW (GLOVE) ×2 IMPLANT
GOWN BRE IMP PREV XXLGXLNG (GOWN DISPOSABLE) ×2 IMPLANT
GOWN PREVENTION PLUS XLARGE (GOWN DISPOSABLE) ×2 IMPLANT
NDL SAFETY ECLIPSE 18X1.5 (NEEDLE) ×1 IMPLANT
NEEDLE 27GAX1X1/2 (NEEDLE) ×2 IMPLANT
NEEDLE HYPO 18GX1.5 SHARP (NEEDLE) ×2
NS IRRIG 1000ML POUR BTL (IV SOLUTION) ×2 IMPLANT
PACK BASIN DAY SURGERY FS (CUSTOM PROCEDURE TRAY) ×2 IMPLANT
PAD CAST 3X4 CTTN HI CHSV (CAST SUPPLIES) ×1 IMPLANT
PADDING CAST ABS 3INX4YD NS (CAST SUPPLIES)
PADDING CAST ABS 4INX4YD NS (CAST SUPPLIES)
PADDING CAST ABS COTTON 3X4 (CAST SUPPLIES) IMPLANT
PADDING CAST ABS COTTON 4X4 ST (CAST SUPPLIES) ×1 IMPLANT
PADDING CAST COTTON 3X4 STRL (CAST SUPPLIES) ×2
SPLINT PLASTER CAST XFAST 3X15 (CAST SUPPLIES) IMPLANT
SPLINT PLASTER XTRA FASTSET 3X (CAST SUPPLIES)
SPONGE GAUZE 4X4 12PLY (GAUZE/BANDAGES/DRESSINGS) ×2 IMPLANT
STOCKINETTE 4X48 STRL (DRAPES) ×2 IMPLANT
SUT VIC AB 4-0 P2 18 (SUTURE) ×2 IMPLANT
SUT VICRYL 4-0 PS2 18IN ABS (SUTURE) IMPLANT
SUT VICRYL RAPID 5 0 P 3 (SUTURE) IMPLANT
SUT VICRYL RAPIDE 4/0 PS 2 (SUTURE) ×2 IMPLANT
SYR BULB 3OZ (MISCELLANEOUS) ×2 IMPLANT
SYR CONTROL 10ML LL (SYRINGE) ×2 IMPLANT
TOWEL OR 17X24 6PK STRL BLUE (TOWEL DISPOSABLE) ×4 IMPLANT
UNDERPAD 30X30 INCONTINENT (UNDERPADS AND DIAPERS) ×2 IMPLANT

## 2013-01-25 NOTE — Anesthesia Preprocedure Evaluation (Signed)
Anesthesia Evaluation  Patient identified by MRN, date of birth, ID band Patient awake    Reviewed: Allergy & Precautions, H&P , NPO status , Patient's Chart, lab work & pertinent test results, reviewed documented beta blocker date and time   Airway Mallampati: I TM Distance: >3 FB Neck ROM: Full    Dental  (+) Partial Lower, Teeth Intact and Dental Advisory Given   Pulmonary  breath sounds clear to auscultation        Cardiovascular hypertension, Pt. on medications and Pt. on home beta blockers + CAD and + Past MI Rhythm:Regular Rate:Normal     Neuro/Psych    GI/Hepatic   Endo/Other  diabetes, Well Controlled, Type 2, Insulin Dependent and Oral Hypoglycemic Agents  Renal/GU      Musculoskeletal   Abdominal   Peds  Hematology   Anesthesia Other Findings   Reproductive/Obstetrics                           Anesthesia Physical Anesthesia Plan  ASA: III  Anesthesia Plan: Bier Block and MAC   Post-op Pain Management:    Induction:   Airway Management Planned: Simple Face Mask  Additional Equipment:   Intra-op Plan:   Post-operative Plan:   Informed Consent: I have reviewed the patients History and Physical, chart, labs and discussed the procedure including the risks, benefits and alternatives for the proposed anesthesia with the patient or authorized representative who has indicated his/her understanding and acceptance.   Dental advisory given  Plan Discussed with: CRNA, Anesthesiologist and Surgeon  Anesthesia Plan Comments:         Anesthesia Quick Evaluation

## 2013-01-25 NOTE — Op Note (Signed)
Dictation Number [id number]

## 2013-01-25 NOTE — Transfer of Care (Signed)
Immediate Anesthesia Transfer of Care Note  Patient: Alicia Medina  Procedure(s) Performed: Procedure(s): CARPAL TUNNEL RELEASE (Right) RELEASE TRIGGER FINGER/A-1 PULLEY RIGHT INDEX FINGER (Right) EXCISION CYST RIGHT MIDDLE FINGER (Right)  Patient Location: PACU  Anesthesia Type:Bier block  Level of Consciousness: awake, oriented and patient cooperative  Airway & Oxygen Therapy: Patient Spontanous Breathing and Patient connected to face mask oxygen  Post-op Assessment: Report given to PACU RN and Post -op Vital signs reviewed and stable  Post vital signs: Reviewed and stable  Complications: No apparent anesthesia complications

## 2013-01-25 NOTE — Brief Op Note (Signed)
01/25/2013  11:01 AM  PATIENT:  Alicia Medina  79 y.o. female  PRE-OPERATIVE DIAGNOSIS:  RIGHT CARPAL TUNNEL SYNDROME, STENOSIS TENOSYNOVITIS RIGHT INDEX, MASS RIGHT MIDDLE  POST-OPERATIVE DIAGNOSIS:  RIGHT CARPAL TUNNEL SYNDROME, STENOSIS   PROCEDURE:  Procedure(s): CARPAL TUNNEL RELEASE (Right) RELEASE TRIGGER FINGER/A-1 PULLEY RIGHT INDEX FINGER (Right) EXCISION CYST RIGHT MIDDLE FINGER (Right)  SURGEON:  Surgeon(s) and Role:    * Nicki Reaper, MD - Primary  PHYSICIAN ASSISTANT:   ASSISTANTS: none   ANESTHESIA:   local and regional  EBL:     BLOOD ADMINISTERED:none  DRAINS: none   LOCAL MEDICATIONS USED:  MARCAINE     SPECIMEN:  Excision  DISPOSITION OF SPECIMEN:  PATHOLOGY  COUNTS:  YES  TOURNIQUET:   Total Tourniquet Time Documented: Upper Arm (Right) - 37 minutes Total: Upper Arm (Right) - 37 minutes   DICTATION: .Other Dictation: Dictation Number [id number]  PLAN OF CARE: Discharge to home after PACU  PATIENT DISPOSITION:  PACU - hemodynamically stable.

## 2013-01-25 NOTE — Anesthesia Postprocedure Evaluation (Signed)
  Anesthesia Post-op Note  Patient: Alicia Medina  Procedure(s) Performed: Procedure(s): CARPAL TUNNEL RELEASE (Right) RELEASE TRIGGER FINGER/A-1 PULLEY RIGHT INDEX FINGER (Right) EXCISION CYST RIGHT MIDDLE FINGER (Right)  Patient Location: PACU  Anesthesia Type:General  Level of Consciousness: awake, alert  and oriented  Airway and Oxygen Therapy: Patient Spontanous Breathing  Post-op Pain: mild  Post-op Assessment: Post-op Vital signs reviewed  Post-op Vital Signs: Reviewed  Complications: No apparent anesthesia complications

## 2013-01-25 NOTE — H&P (Signed)
[name] is a 79 year old right hand dominant female who comes in complaining of bilateral hand pain for the past 2 months, catching of the middle fingers bilaterally with a mass on the right middle finger proximal phalanx. She has no history of injury. She does have a history of diabetes, thyroid problems and arthritis. There is no history of gout. She says Dr. Clarene Duke injected her hands but she does not know what with. She complains of constant, moderate to severe aching pain that is occasionally sharp with a feeling of numbness and weakness. It does not awaken her at night. She does have a history of pinched nerve in her neck and sees a chiropractor for this. She has been seeing him for 20 years. She states activity makes it worse, rest makes it better along with heat. Alicia Medina has a trigger finger mass right middle finger, carpal tunnel syndrome with positive nerve conductions of 4.2 left and 5.5 right.  Sensory delays of 2.5 left, 3.3 right with significant diminution in amplitude on her right side.    The injection gave her minimal relief of symptoms, only very temporary.    PAST MEDICAL HISTORY: She is allergic to Tetracycline and Lisinopril. She is on the following medications: potassium, Metformin, Crestor, Glimepiride, Metoprolol, Losartan, Synthroid, multivitamins, magnesium, and I-caps.  Past surgery includes thyroidectomy.  FAMILY H ISTORY: Negative.  SOCIAL HISTORY: She does not smoke. She drinks socially. 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   REVIEW OF SYSTEMS: Positive for glasses, hearing loss, high BP, heart attack, cataracts otherwise negative for 14 points. Alicia Medina is an 79 y.o. female.   Chief Complaint: CTS rt ,sts rt Index and middle with cyst middle HPI: see above  Past Medical History  Diagnosis Date  . Hypothyroidism, postsurgical   . Hypertension   . Hyperlipidemia   . Diabetes mellitus   . Diastolic heart failure   . Blood loss anemia   .  Wears hearing aid     both ears  . CAD (coronary artery disease) Feb 2013    Inferior STEMI with BMS to the RCA, complicated by cardiogenic shock, VF, VDRF , temporary CHB and abrupt reocclusion of the RCA stent treated with a second BMS to the RCA. Has residual LAD disease. EF is 50%    Past Surgical History  Procedure Laterality Date  . Total thyroidectomy  October 2008  . Coronary stent placement  Feb 2013    BMS to the RCA with abrupt reocclusion treated with 2nd stent  . Cardiac catheterization  2007    cataracts-both    Family History  Problem Relation Age of Onset  . Stroke Mother   . Cancer Father   . Stroke Father    Social History:  reports that she has never smoked. She does not have any smokeless tobacco history on file. She reports that she drinks about 0.6 ounces of alcohol per week. She reports that she does not use illicit drugs.  Allergies:  Allergies  Allergen Reactions  . Lisinopril Cough  . Tetracyclines & Related Nausea Only    Severe stomach cramps    Medications Prior to Admission  Medication Sig Dispense Refill  . insulin glargine (LANTUS) 100 UNIT/ML injection Inject 15 Units into the skin at bedtime.      Marland Kitchen aspirin 81 MG chewable tablet Chew 1 tablet (81 mg total) by mouth daily.      Marland Kitchen azithromycin (ZITHROMAX) 250 MG tablet 250 mg. Mom wed  friday      . glimepiride (AMARYL) 2 MG tablet Take 2 mg by mouth daily before breakfast.      . levothyroxine (SYNTHROID, LEVOTHROID) 150 MCG tablet Take 100 mcg by mouth daily before breakfast.       . losartan (COZAAR) 25 MG tablet Take 25 mg by mouth daily.      . metformin (FORTAMET) 500 MG (OSM) 24 hr tablet Take 500 mg by mouth daily with breakfast.      . metoprolol (LOPRESSOR) 50 MG tablet Take 1 tablet (50 mg total) by mouth 2 (two) times daily.  60 tablet  11  . potassium chloride SA (K-DUR,KLOR-CON) 20 MEQ tablet Take 1 tablet (20 mEq total) by mouth daily.  90 tablet  5  . rosuvastatin (CRESTOR) 20  MG tablet Take 20 mg by mouth every evening.        No results found for this or any previous visit (from the past 48 hour(s)).  No results found.   Pertinent items are noted in HPI.  There were no vitals taken for this visit.  General appearance: alert, cooperative and appears stated age Head: Normocephalic, without obvious abnormality Neck: no JVD Resp: clear to auscultation bilaterally Cardio: regular rate and rhythm, S1, S2 normal, no murmur, click, rub or gallop GI: soft, non-tender; bowel sounds normal; no masses,  no organomegaly Extremities: extremities normal, atraumatic, no cyanosis or edema Pulses: 2+ and symmetric Skin: Skin color, texture, turgor normal. No rashes or lesions Neurologic: Grossly normal Incision/Wound: na  Assessment/Plan She has elected to proceed to have this taken care of surgically.  She does have arthritis in her hand which we would not recommend treatment for. She would like to proceed to have the trigger finger released.  She has a mass on her index finger at the A-1 pulley with very minimal triggering and the mass on her proximal phalanx radial aspect distal to the metacarpophalangeal joint crease. She would like to have that excised in addition.  She is well aware of risks and complications including infection, recurrence, injury to arteries, nerves, tendons, incomplete relief of symptoms and dystrophy.   She is scheduled for right carpal tunnel release, release of A-1 pulley right index, right middle finger with excision of cyst, right middle finger as an outpatient under regional anesthesia.  Alicia Medina 01/25/2013, 9:07 AM

## 2013-01-26 NOTE — Op Note (Signed)
NAMECAYLAH, PLOUFF NO.:  [id number]  MEDICAL RECORD NO.:  [id number]  LOCATION:                                 FACILITY:  PHYSICIAN:  Cindee Salt, M.D.            DATE OF BIRTH:  DATE OF PROCEDURE:  01/25/2013 DATE OF DISCHARGE:                              OPERATIVE REPORT   PREOPERATIVE DIAGNOSIS:  Carpal tunnel syndrome, right hand and stenosing tenosynovitis, right index, right middle with mass proximal phalanx, right middle finger.  POSTOPERATIVE DIAGNOSES:  Carpal tunnel syndrome, right hand and stenosing tenosynovitis, right index, right middle with mass proximal phalanx, right middle finger.  OPERATION:  Release right carpal tunnel, release A1 pulley, right middle, right ring finger with excision of cyst from right middle finger.  SURGEON:  Cindee Salt, M.D.  ANESTHESIA:  Forearm IV regional with local infiltration.  ANESTHESIOLOGIST:  Sheldon Silvan, M.D.  HISTORY:  The patient is a 79 year old female with a history of triggering of the index, middle finger carpal tunnel syndrome, and a mass on her middle finger, volar ulnar aspect proximal phalanx.  She has elected to undergo release of the A1 pulleys, carpal tunnel, and excision of the cyst.  She is aware that there is no guarantee with the surgery; possibility of infection; recurrence of injury to arteries, nerves, tendons, incomplete relief of symptoms, dystrophy.  In the preoperative area, the patient is seen, the extremity marked by both patient and surgeon.  Antibiotic given.  PROCEDURE:  The patient was brought to the operating room where a forearm IV regional anesthetic was carried out without difficulty.  She was prepped using ChloraPrep, supine position with the right arm free. A 3-minute dry time was allowed.  Time-out taken, confirming the patient and procedure.  The index finger was attended to first.  An oblique incision made over the A1 pulley of the right index finger, carried  down through subcutaneous tissue.  Significant fibrosis was present.  A very thick tenosynovitis was present proximally adhering the 2 tendons together.  The A1 pulley was released on its radial aspect after retractors were placed protecting neurovascular bundles.  A small incision was made centrally in A2.  Partial tenosynovectomy was performed proximally.  The wound irrigated.  A separate incision was then made.  This was a volar Bruner incision over the middle finger A1 pulley area, carried down onto the proximal phalanx, carried down through subcutaneous tissue.  Bleeders again electrocauterized with bipolar.  The neurovascular bundles were identified, this was found to be draped over a large cyst on the ulnar aspect of the proximal phalanx. This was opened, draining approximately 2 mL of gelatinous fluid.  The cyst was excised and sent to Pathology protecting the neurovascular bundle.  The A1 pulley was then released.  This was very similar to the index finger with significant thickening of the A1 pulley.  A very adherent tenosynovitis proximally, which was debrided.  Fingers placed through a full range motion, no further triggering was noted on either finger, these were irrigated and closed with interrupted 4-0 Vicryl Rapide sutures.  A separate incision was then made longitudinally in the right  palm, carried down through subcutaneous tissue.  Palmar fascia was split.  Superficial palmar arch identified.  The flexor tendon to the ring and little finger identified to the ulnar side of the median nerve. The carpal retinaculum was incised with sharp dissection.  A right angle and Sewall retractor were placed between skin and forearm fascia.  The fascia was released for approximately 1.5 cm proximal to the wrist crease under direct vision.  Canal was explored.  Area of compression to the nerve was apparent.  The motor branch entered into muscle, no further lesions were identified.  The  wound was irrigated and closed with interrupted 4-0 Vicryl Rapide.  Local infiltration with 0.25% Marcaine without epinephrine was given, approximately 9 mL was used. Sterile compressive dressing was applied with the fingers were left free.  On deflation of the tourniquet, all fingers immediately pinked. She was taken to the recovery room for observation in a satisfactory condition.  She will be discharged home to return to the [facility] [place] in 1 week on Norco.          ______________________________ Cindee Salt, M.D.     GK/MEDQ  D:  01/25/2013  T:  01/25/2013  Job:  [id number]

## 2013-01-30 ENCOUNTER — Encounter (HOSPITAL_BASED_OUTPATIENT_CLINIC_OR_DEPARTMENT_OTHER): Payer: Self-pay | Admitting: Orthopedic Surgery

## 2013-02-13 ENCOUNTER — Ambulatory Visit
Admission: RE | Admit: 2013-02-13 | Discharge: 2013-02-13 | Disposition: A | Discharge status: Home / Self Care | Payer: Medicare Other | Source: Ambulatory Visit

## 2013-02-13 DIAGNOSIS — Z1231 Encounter for screening mammogram for malignant neoplasm of breast: Secondary | ICD-10-CM

## 2013-02-22 ENCOUNTER — Other Ambulatory Visit: Payer: Self-pay | Admitting: Cardiology

## 2013-04-06 ENCOUNTER — Other Ambulatory Visit: Payer: Medicare Other

## 2013-04-13 ENCOUNTER — Other Ambulatory Visit: Payer: Medicare Other

## 2013-04-20 ENCOUNTER — Ambulatory Visit (INDEPENDENT_AMBULATORY_CARE_PROVIDER_SITE_OTHER): Payer: Medicare Other | Admitting: Cardiology

## 2013-04-20 ENCOUNTER — Encounter: Payer: Self-pay | Admitting: Cardiology

## 2013-04-20 VITALS — BP 143/70 | HR 89 | Ht 63.5 in | Wt 173.0 lb

## 2013-04-20 DIAGNOSIS — I2510 Atherosclerotic heart disease of native coronary artery without angina pectoris: Secondary | ICD-10-CM

## 2013-04-20 DIAGNOSIS — E119 Type 2 diabetes mellitus without complications: Secondary | ICD-10-CM

## 2013-04-20 DIAGNOSIS — E785 Hyperlipidemia, unspecified: Secondary | ICD-10-CM

## 2013-04-20 NOTE — Patient Instructions (Signed)
Continue your current therapy.  Try and get more exercise.  I will see you in 6 months. 

## 2013-04-20 NOTE — Progress Notes (Signed)
Alicia Medina Date of Birth: October 29, 1932 Medical Record #[id number]  History of Present Illness: Alicia Medina is seen today for a follow up visit.  She has had an inferior MI treated with BMS to the RCA in February 40981. She required defibrillation multiple times and her hospitalization was complicated by cardiogenic shock, VF, VDRF and CHB requiring a temporary pacemaker. She had abrupt occlusion of the RCA stent that was treated with a 2nd BMS to the RCA.Followup nuclear stress testing in March 2013 showed normal perfusion throughout with ejection fraction of 76%. On followup today she denies any cardiac complaints of chest pain, SOB, or palpitations. Her major complaint today is of hemorrhoids that bleed intermittently. She is seeing surgery for evaluation. She is scheduled for a colonoscopy next week. She also has some pelvic prolapse that may require surgery.  Current Outpatient Prescriptions on File Prior to Visit  Medication Sig Dispense Refill  . aspirin 81 MG chewable tablet Chew 1 tablet (81 mg total) by mouth daily.      Marland Kitchen glimepiride (AMARYL) 2 MG tablet Take 2 mg by mouth daily before breakfast.      . insulin glargine (LANTUS) 100 UNIT/ML injection Inject 15 Units into the skin at bedtime.      Marland Kitchen levothyroxine (SYNTHROID, LEVOTHROID) 150 MCG tablet Take 150 mcg by mouth daily before breakfast.       . losartan (COZAAR) 25 MG tablet Take 25 mg by mouth daily.      . metformin (FORTAMET) 500 MG (OSM) 24 hr tablet Take 500 mg by mouth 3 (three) times daily.       . metoprolol (LOPRESSOR) 50 MG tablet TAKE 1 TABLET (50 MG TOTAL) BY MOUTH 2 (TWO) TIMES DAILY.  60 tablet  11  . potassium chloride SA (K-DUR,KLOR-CON) 20 MEQ tablet Take 1 tablet (20 mEq total) by mouth daily.  90 tablet  5  . rosuvastatin (CRESTOR) 20 MG tablet Take 20 mg by mouth every evening.       No current facility-administered medications on file prior to visit.    Allergies  Allergen Reactions  . Lisinopril Cough   . Tetracyclines & Related Nausea Only    Severe stomach cramps    Past Medical History  Diagnosis Date  . Hypothyroidism, postsurgical   . Hypertension   . Hyperlipidemia   . Diabetes mellitus   . Diastolic heart failure   . Blood loss anemia   . Wears hearing aid     both ears  . CAD (coronary artery disease) Feb 2013    Inferior STEMI with BMS to the RCA, complicated by cardiogenic shock, VF, VDRF , temporary CHB and abrupt reocclusion of the RCA stent treated with a second BMS to the RCA. Has residual LAD disease. EF is 50%    Past Surgical History  Procedure Laterality Date  . Total thyroidectomy  October 2008  . Coronary stent placement  Feb 2013    BMS to the RCA with abrupt reocclusion treated with 2nd stent  . Cardiac catheterization  2007    cataracts-both  . Carpal tunnel release Right 01/25/2013    Procedure: CARPAL TUNNEL RELEASE;  Surgeon: Nicki Reaper, MD;  Location: [facility];  Service: Orthopedics;  Laterality: Right;  . Trigger finger release Right 01/25/2013    Procedure: RELEASE TRIGGER FINGER/A-1 PULLEY RIGHT INDEX FINGER;  Surgeon: Nicki Reaper, MD;  Location: [facility];  Service: Orthopedics;  Laterality: Right;    History  Smoking status  . Never Smoker   Smokeless tobacco  . Not on file    History  Alcohol Use  . 0.6 oz/week  . 1 Glasses of wine per week    Comment: occ    Family History  Problem Relation Age of Onset  . Stroke Mother   . Cancer Father   . Stroke Father     Review of Systems: The review of systems is per the HPI.  All other systems were reviewed and are negative.  Physical Exam: BP 143/70  Pulse 89  Ht 5' 3.5" (1.613 m)  Wt 78.472 kg (173 lb)  BMI 30.16 kg/m2 Patient is very pleasant and in no acute distress.  Skin is warm and dry. Color is normal.  HEENT is unremarkable. Normocephalic/atraumatic. PERRL. Sclera are nonicteric. Neck is supple. No masses. No JVD. Lungs are clear. Cardiac  exam shows a regular rate and rhythm. Abdomen is soft. Extremities are without edema. Gait and ROM are intact. No gross neurologic deficits noted.  LABORATORY DATA: Lab Results  Component Value Date   WBC 6.9 09/21/2011   HGB 14.4 01/25/2013   HCT 36.3 09/21/2011   PLT 320.0 09/21/2011   GLUCOSE 142* 01/20/2013   CHOL 97 05/17/2012   TRIG 80.0 05/17/2012   HDL 42.40 05/17/2012   LDLCALC 39 05/17/2012   ALT 27 05/17/2012   AST 26 05/17/2012   NA 140 01/20/2013   K 4.6 01/20/2013   CL 103 01/20/2013   CREATININE 0.79 01/20/2013   BUN 15 01/20/2013   CO2 28 01/20/2013   TSH 1.552 08/28/2011   INR 1.31 09/01/2011   HGBA1C 7.3* 08/28/2011     Assessment / Plan: 1. Status post acute inferior myocardial infarction in February 2013 with multiple complications. Clinically she is doing very well. Followup nuclear stress test was normal.  Continue ASA. Follow up in 6 months. She is cleared from a cardiac standpoint if she requires surgery for her hemorrhoids or for pelvic prolapse  2. Hyperlipidemia, well controlled on Crestor.  3. Hypertension, controlled.  4. Diabetes mellitus type 2 now on insulin.

## 2013-04-27 ENCOUNTER — Other Ambulatory Visit: Payer: Self-pay | Admitting: Gastroenterology

## 2013-05-08 ENCOUNTER — Ambulatory Visit (INDEPENDENT_AMBULATORY_CARE_PROVIDER_SITE_OTHER): Payer: Medicare Other | Admitting: Surgery

## 2013-05-08 ENCOUNTER — Encounter (INDEPENDENT_AMBULATORY_CARE_PROVIDER_SITE_OTHER): Payer: Self-pay | Admitting: Surgery

## 2013-05-08 VITALS — BP 140/74 | HR 84 | Temp 98.2°F | Resp 15 | Ht 63.5 in | Wt 171.0 lb

## 2013-05-08 DIAGNOSIS — K648 Other hemorrhoids: Secondary | ICD-10-CM

## 2013-05-08 NOTE — Progress Notes (Signed)
General Surgery [facility]  Chief Complaint  Patient presents with  . New Evaluation    eval hemorrhoids - referral from Dr. Vida Rigger    HISTORY: Patient is a 79 year old female known to my surgical practice from thyroidectomy several years ago. Patient has had intermittent problems with internal hemorrhoids for a number of years. She has experienced both pain and bleeding on occasion. She has used topical creams as well as hydrocortisone suppositories. Patient now presents on referral from her gastroenterologist for further evaluation.  Patient denies any anorectal surgery in the past. She did undergo colonoscopy earlier this month.  Past Medical History  Diagnosis Date  . Hypothyroidism, postsurgical   . Hypertension   . Hyperlipidemia   . Diabetes mellitus   . Diastolic heart failure   . Blood loss anemia   . Wears hearing aid     both ears  . CAD (coronary artery disease) Feb 2013    Inferior STEMI with BMS to the RCA, complicated by cardiogenic shock, VF, VDRF , temporary CHB and abrupt reocclusion of the RCA stent treated with a second BMS to the RCA. Has residual LAD disease. EF is 50%  . Arthritis   . CHF (congestive heart failure)     Current Outpatient Prescriptions  Medication Sig Dispense Refill  . aspirin 81 MG chewable tablet Chew 1 tablet (81 mg total) by mouth daily.      Marland Kitchen glimepiride (AMARYL) 2 MG tablet Take 2 mg by mouth daily before breakfast.      . levothyroxine (SYNTHROID, LEVOTHROID) 150 MCG tablet Take 150 mcg by mouth daily before breakfast.       . losartan (COZAAR) 25 MG tablet Take 25 mg by mouth daily.      . metformin (FORTAMET) 500 MG (OSM) 24 hr tablet Take 500 mg by mouth 3 (three) times daily.       . metoprolol (LOPRESSOR) 50 MG tablet TAKE 1 TABLET (50 MG TOTAL) BY MOUTH 2 (TWO) TIMES DAILY.  60 tablet  11  . potassium chloride SA (K-DUR,KLOR-CON) 20 MEQ tablet Take 1 tablet (20 mEq total) by mouth daily.  90 tablet  5   . PROCTOZONE-HC 2.5 % rectal cream Place 1 application rectally as needed.       . rosuvastatin (CRESTOR) 20 MG tablet Take 20 mg by mouth every evening.      . insulin glargine (LANTUS) 100 UNIT/ML injection Inject 15 Units into the skin at bedtime.       No current facility-administered medications for this visit.    Allergies  Allergen Reactions  . Lisinopril Cough  . Tetracyclines & Related Nausea Only    Severe stomach cramps    Family History  Problem Relation Age of Onset  . Stroke Mother   . Cancer Mother     breast  . Cancer Father     lung  . Stroke Father     History   Social History  . Marital Status: Married    Spouse Name: N/A    Number of Children: N/A  . Years of Education: N/A   Social History Main Topics  . Smoking status: Never Smoker   . Smokeless tobacco: Never Used  . Alcohol Use: 0.6 oz/week    1 Glasses of wine per week     Comment: occ  . Drug Use: No  . Sexual Activity: No   Other Topics Concern  . None   Social History Narrative  Lives with husband.   Has 2 sons- both healthy.   Younger son lives in [place], [place] in order son lives in [place].    REVIEW OF SYSTEMS - PERTINENT POSITIVES ONLY: Intermittent pain and bleeding per rectum. Intermittent constipation. Occasional fecal incontinence. Mucous discharge.  EXAM: Filed Vitals:   05/08/13 1326  BP: 140/74  Pulse: 84  Temp: 98.2 F (36.8 C)  Resp: 15    HEENT: normocephalic; pupils equal and reactive; sclerae clear; dentition good; mucous membranes moist NECK:  Well-healed cervical incision; symmetric on extension; no palpable anterior or posterior cervical lymphadenopathy; no supraclavicular masses; no tenderness CHEST: clear to auscultation bilaterally without rales, rhonchi, or wheezes CARDIAC: regular rate and rhythm without significant murmur; peripheral pulses are full RECTAL: Multiple external anal skin tags; digital exam shows slightly decreased anal tone; no  palpable masses in the anal canal or lower rectum; anoscopy is performed and lower rectal mucosa appears grossly normal; there are grade 2 internal hemorrhoids present; the most prominent column is the right posterior with very superficial ectatic vessels; rubber band ligation is performed at this point without discomfort. EXT:  non-tender without edema; no deformity NEURO: no gross focal deficits; no sign of tremor   LABORATORY RESULTS: See [facility] (CHL-Epic) for most recent results  RADIOLOGY RESULTS: See [facility] (CHL-Epic) for most recent results  IMPRESSION: Grade 2 internal hemorrhoids with history of bleeding and prolapse  PLAN: Patient was given written information regarding hemorrhoids and there management. I explained the procedure of rubber band ligation. We discussed post procedure management.  Patient will return in 3-4 weeks to assess her progress. She may require repeat rubber band ligation at that time. The degree of hemorrhoid disease is such that it will likely be manageable in the office setting. However, if the patient has significant fecal incontinence, I may ask her to be evaluated by our colorectal surgeon.  Patient will return for evaluation in 3-4 weeks.  Velora Heckler, MD, FACS General & Endocrine Surgery [facility]  Primary Care Physician: Aida Puffer, MD

## 2013-05-08 NOTE — Patient Instructions (Signed)
ANORECTAL PROCEDURES: 1.  Tub soaks 2-3 times daily in warm water (may add Epsom salts if desired) 2.  Stool softener for one month (store brand Miralax or Colace) 3.  Avoid toilet paper - use baby wipes or Tucks pads 4.  Increase water intake - 6-8 glasses daily 5.  Apply dry pad to area until drainage stops 

## 2013-06-14 ENCOUNTER — Ambulatory Visit (INDEPENDENT_AMBULATORY_CARE_PROVIDER_SITE_OTHER): Payer: Medicare Other | Admitting: Surgery

## 2013-08-18 ENCOUNTER — Ambulatory Visit (INDEPENDENT_AMBULATORY_CARE_PROVIDER_SITE_OTHER): Payer: Medicare Other | Admitting: Surgery

## 2013-08-18 ENCOUNTER — Encounter (INDEPENDENT_AMBULATORY_CARE_PROVIDER_SITE_OTHER): Payer: Self-pay | Admitting: Surgery

## 2013-08-18 VITALS — BP 131/82 | HR 72 | Temp 98.6°F | Resp 18 | Ht 64.0 in | Wt 168.2 lb

## 2013-08-18 DIAGNOSIS — K648 Other hemorrhoids: Secondary | ICD-10-CM

## 2013-08-18 NOTE — Progress Notes (Signed)
General Surgery [facility]  Chief Complaint  Patient presents with  . Rectal Problems    hemorrhoids, possible incontinence    HISTORY: The patient is a 80 year old female followed for hemorrhoid disease. She was last here in October and underwent rubber band ligation with good results. She had not seen any bleeding until the past week. She has noted some prolapse and intermittent bleeding. She presents today for evaluation.  PERTINENT REVIEW OF SYSTEMS: Mild prolapse of hemorrhoidal tissue. Intermittent bleeding with bowel movements. Denies any fecal incontinence. Occasional mucous discharge.  EXAM: HEENT: normocephalic; pupils equal and reactive; sclerae clear; dentition good; mucous membranes moist NECK:  symmetric on extension; no palpable anterior or posterior cervical lymphadenopathy; no supraclavicular masses; no tenderness CHEST: clear to auscultation bilaterally without rales, rhonchi, or wheezes CARDIAC: regular rate and rhythm without significant murmur; peripheral pulses are full EXT:  non-tender without edema; no deformity GU/RECTAL: Multiple benign anal skin tags; mild to moderate prolapse particularly in the right anterior column; anoscopy is performed and lower rectal mucosa appears normal with grade 2 internal hemorrhoids; right anterior column however is edematous, friable, and protruding; rubber band ligation is performed without significant discomfort   IMPRESSION: Grade 2-3 internal hemorrhoids with prolapse and bleeding  PLAN: Instructions are given for management following rubber band ligation. I am also going to give her a prescription for Analpram 2.5% hydrocortisone cream to use topically externally.  Patient will contact us if symptoms persist. At this time there is no need for evaluation for incontinence.  Earnstine Regal, MD, [facility] Office: [phone]  Visit Diagnoses: 1. Hemorrhoids, internal, with  bleeding

## 2013-08-18 NOTE — Patient Instructions (Signed)
ANORECTAL PROCEDURES: 1.  Tub soaks 2-3 times daily in warm water (may add Epsom salts if desired) 2.  Stool softener for one month (store brand Miralax or Colace) 3.  Avoid toilet paper - use baby wipes or Tucks pads 4.  Increase water intake - 6-8 glasses daily 5.  Apply dry pad to area until drainage stops 

## 2013-08-22 ENCOUNTER — Telehealth (INDEPENDENT_AMBULATORY_CARE_PROVIDER_SITE_OTHER): Payer: Self-pay | Admitting: General Surgery

## 2013-08-22 ENCOUNTER — Other Ambulatory Visit (INDEPENDENT_AMBULATORY_CARE_PROVIDER_SITE_OTHER): Payer: Self-pay | Admitting: General Surgery

## 2013-08-22 NOTE — Telephone Encounter (Signed)
Pt called to explain that Dr. Harlow Asa had electronically orders a new cream for her hems while she was at the office, but her pharmacy had no record of it.  Chart notes were reviewed and Analapram 2.5 % cream was seen, but was not apparently ordered electronically.  Phoned in this med for her to [facility]:  [phone].

## 2013-09-25 ENCOUNTER — Encounter: Payer: Self-pay | Admitting: Cardiology

## 2013-09-25 ENCOUNTER — Ambulatory Visit (INDEPENDENT_AMBULATORY_CARE_PROVIDER_SITE_OTHER): Payer: Medicare Other | Admitting: Cardiology

## 2013-09-25 VITALS — BP 140/70 | HR 76 | Ht 64.0 in | Wt 167.0 lb

## 2013-09-25 DIAGNOSIS — E785 Hyperlipidemia, unspecified: Secondary | ICD-10-CM

## 2013-09-25 DIAGNOSIS — E119 Type 2 diabetes mellitus without complications: Secondary | ICD-10-CM

## 2013-09-25 DIAGNOSIS — I2510 Atherosclerotic heart disease of native coronary artery without angina pectoris: Secondary | ICD-10-CM

## 2013-09-25 DIAGNOSIS — I252 Old myocardial infarction: Secondary | ICD-10-CM

## 2013-09-25 NOTE — Patient Instructions (Signed)
Continue your current therapy  I will see you in 6 months.   

## 2013-09-25 NOTE — Progress Notes (Signed)
Alicia Medina Date of Birth: 1932/10/21 Medical Record #[id number]  History of Present Illness: Alicia Medina is seen today for a follow up visit.  She has had an inferior MI treated with BMS to the RCA in February 2013. She required defibrillation multiple times and her hospitalization was complicated by cardiogenic shock, VF, VDRF and CHB requiring a temporary pacemaker. She had abrupt occlusion of the RCA stent that was treated with a 2nd BMS to the RCA.Followup nuclear stress testing in March 2013 showed normal perfusion throughout with ejection fraction of 76%. On followup today she denies any cardiac complaints of chest pain, SOB, or palpitations. She did have banding procedures for her hemhorroids. She may need surgery on her hands for trigger fingers.  Current Outpatient Prescriptions on File Prior to Visit  Medication Sig Dispense Refill  . glimepiride (AMARYL) 2 MG tablet Take 2 mg by mouth daily before breakfast.      . insulin glargine (LANTUS) 100 UNIT/ML injection Inject 15 Units into the skin at bedtime.      Marland Kitchen levothyroxine (SYNTHROID, LEVOTHROID) 150 MCG tablet Take 150 mcg by mouth daily before breakfast.       . losartan (COZAAR) 25 MG tablet Take 25 mg by mouth daily.      . metformin (FORTAMET) 500 MG (OSM) 24 hr tablet Take 500 mg by mouth 3 (three) times daily.       . metoprolol (LOPRESSOR) 50 MG tablet TAKE 1 TABLET (50 MG TOTAL) BY MOUTH 2 (TWO) TIMES DAILY.  60 tablet  11  . potassium chloride SA (K-DUR,KLOR-CON) 20 MEQ tablet Take 1 tablet (20 mEq total) by mouth daily.  90 tablet  5  . PROCTOZONE-HC 2.5 % rectal cream Place 1 application rectally as needed.       . rosuvastatin (CRESTOR) 20 MG tablet Take 20 mg by mouth every evening.       No current facility-administered medications on file prior to visit.    Allergies  Allergen Reactions  . Lisinopril Cough  . Tetracyclines & Related Nausea Only    Severe stomach cramps    Past Medical History  Diagnosis  Date  . Hypothyroidism, postsurgical   . Hypertension   . Hyperlipidemia   . Diabetes mellitus   . Diastolic heart failure   . Blood loss anemia   . Wears hearing aid     both ears  . CAD (coronary artery disease) Feb 2013    Inferior STEMI with BMS to the RCA, complicated by cardiogenic shock, VF, VDRF , temporary CHB and abrupt reocclusion of the RCA stent treated with a second BMS to the RCA. Has residual LAD disease. EF is 50%  . Arthritis   . CHF (congestive heart failure)     Past Surgical History  Procedure Laterality Date  . Total thyroidectomy  October 2008  . Coronary stent placement  Feb 2013    BMS to the RCA with abrupt reocclusion treated with 2nd stent  . Cardiac catheterization  2007    cataracts-both  . Carpal tunnel release Right 01/25/2013    Procedure: CARPAL TUNNEL RELEASE;  Surgeon: Wynonia Sours, MD;  Location: [facility];  Service: Orthopedics;  Laterality: Right;  . Trigger finger release Right 01/25/2013    Procedure: RELEASE TRIGGER FINGER/A-1 PULLEY RIGHT INDEX FINGER;  Surgeon: Wynonia Sours, MD;  Location: [facility];  Service: Orthopedics;  Laterality: Right;    History  Smoking status  . Never Smoker  Smokeless tobacco  . Never Used    History  Alcohol Use  . 0.6 oz/week  . 1 Glasses of wine per week    Comment: occ    Family History  Problem Relation Age of Onset  . Stroke Mother   . Cancer Mother     breast  . Cancer Father     lung  . Stroke Father     Review of Systems: The review of systems is per the HPI.  All other systems were reviewed and are negative.  Physical Exam: BP 140/70  Pulse 76  Ht 5\' 4"  (1.626 m)  Wt 167 lb (75.751 kg)  BMI 28.65 kg/m2 Patient is very pleasant and in no acute distress.  Skin is warm and dry. Color is normal.  HEENT is unremarkable. Normocephalic/atraumatic. PERRL. Sclera are nonicteric. Neck is supple. No masses. No JVD. Lungs are clear. Cardiac exam shows a  regular rate and rhythm. Abdomen is soft. Extremities are without edema. Gait and ROM are intact. No gross neurologic deficits noted.  LABORATORY DATA: Lab Results  Component Value Date   WBC 6.9 09/21/2011   HGB 14.4 01/25/2013   HCT 36.3 09/21/2011   PLT 320.0 09/21/2011   GLUCOSE 142* 01/20/2013   CHOL 97 05/17/2012   TRIG 80.0 05/17/2012   HDL 42.40 05/17/2012   LDLCALC 39 05/17/2012   ALT 27 05/17/2012   AST 26 05/17/2012   NA 140 01/20/2013   K 4.6 01/20/2013   CL 103 01/20/2013   CREATININE 0.79 01/20/2013   BUN 15 01/20/2013   CO2 28 01/20/2013   TSH 1.552 08/28/2011   INR 1.31 09/01/2011   HGBA1C 7.3* 08/28/2011   Ecg: NSR, normal Ecg.  Assessment / Plan: 1. Status post acute inferior myocardial infarction in February 2013 with multiple complications. Clinically she is doing very well. Followup nuclear stress test was normal.  Continue ASA. Follow up in 6 months. She is cleared from a cardiac standpoint if she requires surgery for her hemorrhoids or for her hands.  2. Hyperlipidemia, well controlled on Crestor.  3. Hypertension, controlled.  4. Diabetes mellitus type 2 now on insulin.

## 2013-09-26 ENCOUNTER — Other Ambulatory Visit: Payer: Self-pay | Admitting: Cardiology

## 2013-10-17 ENCOUNTER — Other Ambulatory Visit: Payer: Self-pay | Admitting: Orthopedic Surgery

## 2013-10-23 ENCOUNTER — Encounter (HOSPITAL_BASED_OUTPATIENT_CLINIC_OR_DEPARTMENT_OTHER)
Admission: RE | Admit: 2013-10-23 | Discharge: 2013-10-23 | Disposition: A | Discharge status: Home / Self Care | Payer: Medicare Other | Source: Ambulatory Visit | Attending: Orthopedic Surgery | Admitting: Orthopedic Surgery

## 2013-10-23 ENCOUNTER — Encounter (HOSPITAL_BASED_OUTPATIENT_CLINIC_OR_DEPARTMENT_OTHER): Payer: Self-pay | Admitting: *Deleted

## 2013-10-23 LAB — BASIC METABOLIC PANEL
BUN: 12 mg/dL (ref 6–23)
CHLORIDE: 99 meq/L (ref 96–112)
CO2: 24 mEq/L (ref 19–32)
Calcium: 9.1 mg/dL (ref 8.4–10.5)
Creatinine, Ser: 0.75 mg/dL (ref 0.50–1.10)
GFR calc non Af Amer: 79 mL/min — ABNORMAL LOW (ref >90)
GLUCOSE: 213 mg/dL — AB (ref 70–99)
POTASSIUM: 4.7 meq/L (ref 3.7–5.3)
Sodium: 138 mEq/L (ref 137–147)

## 2013-10-23 NOTE — Progress Notes (Signed)
To come in for bmet-had ekg 3/15 dr [place]

## 2013-10-25 ENCOUNTER — Ambulatory Visit (HOSPITAL_BASED_OUTPATIENT_CLINIC_OR_DEPARTMENT_OTHER)
Admission: RE | Admit: 2013-10-25 | Discharge: 2013-10-25 | Disposition: A | Discharge status: Home / Self Care | Payer: Medicare Other | Source: Ambulatory Visit | Attending: Orthopedic Surgery | Admitting: Orthopedic Surgery

## 2013-10-25 ENCOUNTER — Ambulatory Visit (HOSPITAL_BASED_OUTPATIENT_CLINIC_OR_DEPARTMENT_OTHER): Payer: Medicare Other | Admitting: Anesthesiology

## 2013-10-25 ENCOUNTER — Encounter (HOSPITAL_BASED_OUTPATIENT_CLINIC_OR_DEPARTMENT_OTHER): Payer: Medicare Other | Admitting: Anesthesiology

## 2013-10-25 ENCOUNTER — Encounter (HOSPITAL_BASED_OUTPATIENT_CLINIC_OR_DEPARTMENT_OTHER): Payer: Self-pay | Admitting: Orthopedic Surgery

## 2013-10-25 ENCOUNTER — Encounter (HOSPITAL_BASED_OUTPATIENT_CLINIC_OR_DEPARTMENT_OTHER)
Admission: RE | Disposition: A | Discharge status: Home / Self Care | Payer: Self-pay | Source: Ambulatory Visit | Attending: Orthopedic Surgery

## 2013-10-25 DIAGNOSIS — E119 Type 2 diabetes mellitus without complications: Secondary | ICD-10-CM | POA: Insufficient documentation

## 2013-10-25 DIAGNOSIS — I10 Essential (primary) hypertension: Secondary | ICD-10-CM | POA: Insufficient documentation

## 2013-10-25 DIAGNOSIS — Z9861 Coronary angioplasty status: Secondary | ICD-10-CM | POA: Insufficient documentation

## 2013-10-25 DIAGNOSIS — E890 Postprocedural hypothyroidism: Secondary | ICD-10-CM | POA: Insufficient documentation

## 2013-10-25 DIAGNOSIS — G5600 Carpal tunnel syndrome, unspecified upper limb: Secondary | ICD-10-CM | POA: Insufficient documentation

## 2013-10-25 DIAGNOSIS — M6530 Trigger finger, unspecified finger: Secondary | ICD-10-CM | POA: Insufficient documentation

## 2013-10-25 DIAGNOSIS — Z794 Long term (current) use of insulin: Secondary | ICD-10-CM | POA: Insufficient documentation

## 2013-10-25 DIAGNOSIS — Z7982 Long term (current) use of aspirin: Secondary | ICD-10-CM | POA: Insufficient documentation

## 2013-10-25 DIAGNOSIS — I252 Old myocardial infarction: Secondary | ICD-10-CM | POA: Insufficient documentation

## 2013-10-25 DIAGNOSIS — I5030 Unspecified diastolic (congestive) heart failure: Secondary | ICD-10-CM | POA: Insufficient documentation

## 2013-10-25 DIAGNOSIS — H9190 Unspecified hearing loss, unspecified ear: Secondary | ICD-10-CM | POA: Insufficient documentation

## 2013-10-25 DIAGNOSIS — I509 Heart failure, unspecified: Secondary | ICD-10-CM | POA: Insufficient documentation

## 2013-10-25 DIAGNOSIS — I2510 Atherosclerotic heart disease of native coronary artery without angina pectoris: Secondary | ICD-10-CM | POA: Insufficient documentation

## 2013-10-25 DIAGNOSIS — E785 Hyperlipidemia, unspecified: Secondary | ICD-10-CM | POA: Insufficient documentation

## 2013-10-25 HISTORY — PX: TRIGGER FINGER RELEASE: SHX641

## 2013-10-25 HISTORY — PX: CARPAL TUNNEL RELEASE: SHX101

## 2013-10-25 LAB — POCT HEMOGLOBIN-HEMACUE: Hemoglobin: 12 g/dL (ref 12.0–15.0)

## 2013-10-25 LAB — GLUCOSE, CAPILLARY
Glucose-Capillary: 140 mg/dL — ABNORMAL HIGH (ref 70–99)
Glucose-Capillary: 133 mg/dL — ABNORMAL HIGH (ref 70–99)

## 2013-10-25 SURGERY — CARPAL TUNNEL RELEASE
Anesthesia: Monitor Anesthesia Care | Site: Wrist | Laterality: Left

## 2013-10-25 MED ORDER — LACTATED RINGERS IV SOLN
INTRAVENOUS | Status: DC
  Administered 2013-10-25: 08:00:00 via INTRAVENOUS

## 2013-10-25 MED ORDER — LIDOCAINE HCL (CARDIAC) 20 MG/ML IV SOLN
INTRAVENOUS | Status: DC | PRN
  Administered 2013-10-25: 25 mg via INTRAVENOUS

## 2013-10-25 MED ORDER — MIDAZOLAM HCL 2 MG/2ML IJ SOLN
INTRAMUSCULAR | Status: AC
  Filled 2013-10-25: qty 2

## 2013-10-25 MED ORDER — LIDOCAINE HCL (PF) 0.5 % IJ SOLN
INTRAMUSCULAR | Status: DC | PRN
  Administered 2013-10-25: 30 mL via INTRAVENOUS

## 2013-10-25 MED ORDER — CEFAZOLIN SODIUM-DEXTROSE 2-3 GM-% IV SOLR
INTRAVENOUS | Status: AC
  Filled 2013-10-25: qty 50

## 2013-10-25 MED ORDER — SUCCINYLCHOLINE CHLORIDE 20 MG/ML IJ SOLN
INTRAMUSCULAR | Status: AC
  Filled 2013-10-25: qty 1

## 2013-10-25 MED ORDER — PROPOFOL INFUSION 10 MG/ML OPTIME
INTRAVENOUS | Status: DC | PRN
  Administered 2013-10-25: 100 ug/kg/min via INTRAVENOUS

## 2013-10-25 MED ORDER — ONDANSETRON HCL 4 MG/2ML IJ SOLN
INTRAMUSCULAR | Status: DC | PRN
  Administered 2013-10-25: 4 mg via INTRAVENOUS

## 2013-10-25 MED ORDER — MIDAZOLAM HCL 2 MG/2ML IJ SOLN: 1.0000 mg | INTRAMUSCULAR | Status: DC | PRN

## 2013-10-25 MED ORDER — FENTANYL CITRATE 0.05 MG/ML IJ SOLN: 50.0000 ug | INTRAMUSCULAR | Status: DC | PRN

## 2013-10-25 MED ORDER — FENTANYL CITRATE 0.05 MG/ML IJ SOLN: 25.0000 ug | INTRAMUSCULAR | Status: DC | PRN

## 2013-10-25 MED ORDER — PROPOFOL 10 MG/ML IV EMUL
INTRAVENOUS | Status: AC
  Filled 2013-10-25: qty 50

## 2013-10-25 MED ORDER — CEFAZOLIN SODIUM-DEXTROSE 2-3 GM-% IV SOLR
2.0000 g | INTRAVENOUS | Status: AC
  Administered 2013-10-25: 2 g via INTRAVENOUS

## 2013-10-25 MED ORDER — CHLORHEXIDINE GLUCONATE 4 % EX LIQD: 60.0000 mL | Freq: Once | CUTANEOUS | Status: DC

## 2013-10-25 MED ORDER — CEFAZOLIN SODIUM-DEXTROSE 2-3 GM-% IV SOLR: 2.0000 g | INTRAVENOUS | Status: DC

## 2013-10-25 MED ORDER — FENTANYL CITRATE 0.05 MG/ML IJ SOLN
INTRAMUSCULAR | Status: DC | PRN
  Administered 2013-10-25: 25 ug via INTRAVENOUS
  Administered 2013-10-25: 50 ug via INTRAVENOUS

## 2013-10-25 MED ORDER — BUPIVACAINE HCL (PF) 0.25 % IJ SOLN
INTRAMUSCULAR | Status: AC
  Filled 2013-10-25: qty 30

## 2013-10-25 MED ORDER — FENTANYL CITRATE 0.05 MG/ML IJ SOLN
INTRAMUSCULAR | Status: AC
  Filled 2013-10-25: qty 6

## 2013-10-25 MED ORDER — BUPIVACAINE HCL (PF) 0.25 % IJ SOLN
INTRAMUSCULAR | Status: DC | PRN
  Administered 2013-10-25: 8 mL

## 2013-10-25 MED ORDER — HYDROCODONE-ACETAMINOPHEN 5-325 MG PO TABS: 1.0000 | ORAL_TABLET | Freq: Four times a day (QID) | ORAL | Status: DC | PRN

## 2013-10-25 SURGICAL SUPPLY — 43 items
BANDAGE COBAN STERILE 2 (GAUZE/BANDAGES/DRESSINGS) ×2 IMPLANT
BLADE SURG 15 STRL LF DISP TIS (BLADE) ×2 IMPLANT
BLADE SURG 15 STRL SS (BLADE) ×4
BNDG CMPR 9X4 STRL LF SNTH (GAUZE/BANDAGES/DRESSINGS)
BNDG COHESIVE 3X5 TAN STRL LF (GAUZE/BANDAGES/DRESSINGS) ×4 IMPLANT
BNDG ESMARK 4X9 LF (GAUZE/BANDAGES/DRESSINGS) IMPLANT
BNDG GAUZE ELAST 4 BULKY (GAUZE/BANDAGES/DRESSINGS) ×4 IMPLANT
CHLORAPREP W/TINT 26ML (MISCELLANEOUS) ×4 IMPLANT
CORDS BIPOLAR (ELECTRODE) ×4 IMPLANT
COVER MAYO STAND STRL (DRAPES) ×4 IMPLANT
COVER TABLE BACK 60X90 (DRAPES) ×4 IMPLANT
CUFF TOURNIQUET SINGLE 18IN (TOURNIQUET CUFF) ×4 IMPLANT
DECANTER SPIKE VIAL GLASS SM (MISCELLANEOUS) IMPLANT
DRAPE EXTREMITY T 121X128X90 (DRAPE) ×4 IMPLANT
DRAPE SURG 17X23 STRL (DRAPES) ×4 IMPLANT
DRSG KUZMA FLUFF (GAUZE/BANDAGES/DRESSINGS) IMPLANT
DRSG PAD ABDOMINAL 8X10 ST (GAUZE/BANDAGES/DRESSINGS) ×4 IMPLANT
GAUZE XEROFORM 1X8 LF (GAUZE/BANDAGES/DRESSINGS) ×4 IMPLANT
GLOVE BIOGEL PI IND STRL 7.0 (GLOVE) IMPLANT
GLOVE BIOGEL PI IND STRL 8.5 (GLOVE) ×2 IMPLANT
GLOVE BIOGEL PI INDICATOR 7.0 (GLOVE) ×2
GLOVE BIOGEL PI INDICATOR 8.5 (GLOVE) ×2
GLOVE ECLIPSE 6.5 STRL STRAW (GLOVE) ×2 IMPLANT
GLOVE EXAM NITRILE LRG STRL (GLOVE) ×2 IMPLANT
GLOVE SURG ORTHO 8.0 STRL STRW (GLOVE) ×4 IMPLANT
GOWN STRL REUS W/ TWL LRG LVL3 (GOWN DISPOSABLE) ×2 IMPLANT
GOWN STRL REUS W/TWL LRG LVL3 (GOWN DISPOSABLE) ×4
GOWN STRL REUS W/TWL XL LVL3 (GOWN DISPOSABLE) ×4 IMPLANT
NEEDLE 27GAX1X1/2 (NEEDLE) ×4 IMPLANT
NS IRRIG 1000ML POUR BTL (IV SOLUTION) ×4 IMPLANT
PACK BASIN DAY SURGERY FS (CUSTOM PROCEDURE TRAY) ×4 IMPLANT
PAD CAST 3X4 CTTN HI CHSV (CAST SUPPLIES) IMPLANT
PADDING CAST ABS 4INX4YD NS (CAST SUPPLIES) ×2
PADDING CAST ABS COTTON 4X4 ST (CAST SUPPLIES) ×2 IMPLANT
PADDING CAST COTTON 3X4 STRL (CAST SUPPLIES) ×4
SPONGE GAUZE 4X4 12PLY (GAUZE/BANDAGES/DRESSINGS) ×4 IMPLANT
STOCKINETTE 4X48 STRL (DRAPES) ×4 IMPLANT
SUT VICRYL 4-0 PS2 18IN ABS (SUTURE) IMPLANT
SUT VICRYL RAPIDE 4/0 PS 2 (SUTURE) ×4 IMPLANT
SYR BULB 3OZ (MISCELLANEOUS) ×4 IMPLANT
SYR CONTROL 10ML LL (SYRINGE) ×4 IMPLANT
TOWEL OR 17X24 6PK STRL BLUE (TOWEL DISPOSABLE) ×8 IMPLANT
UNDERPAD 30X30 INCONTINENT (UNDERPADS AND DIAPERS) ×4 IMPLANT

## 2013-10-25 NOTE — Op Note (Signed)
Alicia Medina, Alicia Medina NO.:  [id number]  MEDICAL RECORD NO.:  [id number]  LOCATION:                                 FACILITY:  PHYSICIAN:  Daryll Brod, M.D.            DATE OF BIRTH:  DATE OF PROCEDURE:  10/25/2013 DATE OF DISCHARGE:                              OPERATIVE REPORT   PREOPERATIVE DIAGNOSES: 1. Stenosing tenosynovitis, left middle finger. 2. Carpal tunnel syndrome, left hand.  POSTOPERATIVE DIAGNOSES: 1. Stenosing tenosynovitis, left middle finger. 2. Carpal tunnel syndrome, left hand.  OPERATION: 1. Decompression, left median nerve. 2. Release of A1 pulley, left middle finger.  SURGEON:  Daryll Brod, MD  ANESTHESIA:  Forearm-based IV regional with local infiltration.  ANESTHESIOLOGIST:  Soledad Gerlach, MD  HISTORY:  The patient is a 80 year old female with a history of bilateral carpal tunnel syndrome and triggering of left middle finger. She has undergone carpal tunnel syndrome release on the right side and is admitted now for the left long with release of the A1 pulley.  She is aware of risks and complications including infection; recurrence of injury to arteries, nerves, tendons, incomplete relief of symptoms, dystrophy.  Nerve conductions are positive.  She has elected to undergo surgical intervention.  PROCEDURE IN DETAIL:  The patient was brought to the operating room where a forearm-based IV regional anesthetic was carried out without difficulty.  She was prepped using ChloraPrep, supine position with the left arm free.  A 3-minute dry time was allowed.  Time-out taken, confirming the patient and procedure.  The A1 pulley was attended to first.  An oblique incision made over the A1 pulley, carried down through subcutaneous tissue.  Bleeders were electrocauterized.  The A1 pulley was identified.  Retractors placed protecting neurovascular bundles radially and ulnarly.  An incision was then made on the radial aspect of a  markedly thickened A1 pulley. A small incision made centrally in A2.  Partial tenosynovectomy performed proximally with full flexion and extension, separation of the tendons.  No further triggering was noted.  The wound was irrigated and closed with interrupted 4-0 Vicryl Rapide sutures.  Separate incision was then made over the mid palm longitudinally, carried down through subcutaneous tissue.  Bleeders again electrocauterized with bipolar.  Palmar fascia was split. Superficial palmar arch identified.  Flexor tendon to the ring and little finger identified.  Retractors placed protecting the median nerve to the radial side and ulnar nerve to the ulnar side.  The flexor retinaculum was then incised with sharp dissection.  A right-angle and Sewall retractor were placed between skin and forearm fascia and the forearm fascia was released for approximately 2 cm to 3 cm proximal to the wrist crease under direct vision.  Canal was explored.  Motor branch entered into muscle.  Air compression to the nerve was apparent. Moderate tenosynovitis was present.  No further lesions were identified. The wound was irrigated.  The skin then closed with interrupted 4-0 Rapide sutures.  Local infiltration with 0.25% Marcaine without epinephrine was given, approximately 8 mL was used on each of the incisions.  Sterile compressive dressing was applied with the fingers free  and on deflation of the tourniquet, all fingers immediately pinked. She was taken to the recovery room for observation in satisfactory condition.  She will be discharged home to return to the [facility] in 1 week on Norco.          ______________________________ Daryll Brod, M.D.     GK/MEDQ  D:  10/25/2013  T:  10/25/2013  Job:  [id number]

## 2013-10-25 NOTE — Op Note (Signed)
Dictation Number [id number]

## 2013-10-25 NOTE — Anesthesia Postprocedure Evaluation (Signed)
  Anesthesia Post-op Note  Patient: Alicia Medina  Procedure(s) Performed: Procedure(s): LEFT CARPAL TUNNEL RELEASE (Left) RELEASE A-1 PULLEY LEFT MIDDLE FINGER (Left)  Patient Location: PACU  Anesthesia Type: MAC  Level of Consciousness: awake and alert   Airway and Oxygen Therapy: Patient Spontanous Breathing  Post-op Pain: none  Post-op Assessment: Post-op Vital signs reviewed, Patient's Cardiovascular Status Stable and Respiratory Function Stable  Post-op Vital Signs: Reviewed  Filed Vitals:   10/25/13 0959  BP:   Pulse: 73  Temp:   Resp: 17    Complications: No apparent anesthesia complications

## 2013-10-25 NOTE — Transfer of Care (Signed)
Immediate Anesthesia Transfer of Care Note  Patient: Alicia Medina  Procedure(s) Performed: Procedure(s): LEFT CARPAL TUNNEL RELEASE (Left) RELEASE A-1 PULLEY LEFT MIDDLE FINGER (Left)  Patient Location: PACU  Anesthesia Type:MAC and Bier block  Level of Consciousness: awake, alert  and oriented  Airway & Oxygen Therapy: Patient Spontanous Breathing and Patient connected to face mask oxygen  Post-op Assessment: Report given to PACU RN, Post -op Vital signs reviewed and stable and Patient moving all extremities  Post vital signs: Reviewed and stable  Complications: No apparent anesthesia complications

## 2013-10-25 NOTE — H&P (Signed)
Alicia Medina is a 80 yo female staus post  trigger finger release, carpal tunnel right hand.  This is doing well.  She continues to complain of her left side.  She shows triggering of her left middle finger.  She would like to proceed. Nerve conductions are positive on her left side.    She is allergic to tetracycline and Lisinopril.    She is on potassium, metformin, Crestor, glimepiride, metoprolol, losartan, Synthroid, multivitamins, vitamins C, D, E, B-12, magnesium and I-caps.  Past surgery includes thyroidectomy.  Family history is negative.   Social history, she does not smoke, drinks socially.  Married, housewife, retired Network engineer.  Review of systems is positive for hearing loss, high blood pressure, heart attack, cataracts, otherwise negative.  Alicia Medina is an 80 y.o. female.   Chief Complaint: cts sts left hand and left middle fionger HPI: see abvove  Past Medical History  Diagnosis Date  . Hypothyroidism, postsurgical   . Hypertension   . Hyperlipidemia   . Diabetes mellitus   . Diastolic heart failure   . Blood loss anemia   . Wears hearing aid     both ears  . CAD (coronary artery disease) Feb 2013    Inferior STEMI with BMS to the RCA, complicated by cardiogenic shock, VF, VDRF , temporary CHB and abrupt reocclusion of the RCA stent treated with a second BMS to the RCA. Has residual LAD disease. EF is 50%  . Arthritis   . CHF (congestive heart failure)     Past Surgical History  Procedure Laterality Date  . Total thyroidectomy  October 2008  . Coronary stent placement  Feb 2013    BMS to the RCA with abrupt reocclusion treated with 2nd stent  . Cardiac catheterization  2007    cataracts-both  . Carpal tunnel release Right 01/25/2013    Procedure: CARPAL TUNNEL RELEASE;  Surgeon: Wynonia Sours, MD;  Location: [place] [facility];  Service: Orthopedics;  Laterality: Right;  . Trigger finger release Right 01/25/2013    Procedure: RELEASE TRIGGER FINGER/A-1  PULLEY RIGHT INDEX FINGER;  Surgeon: Wynonia Sours, MD;  Location: [facility];  Service: Orthopedics;  Laterality: Right;    Family History  Problem Relation Age of Onset  . Stroke Mother   . Cancer Mother     breast  . Cancer Father     lung  . Stroke Father    Social History:  reports that she has never smoked. She has never used smokeless tobacco. She reports that she drinks about 0.6 ounces of alcohol per week. She reports that she does not use illicit drugs.  Allergies:  Allergies  Allergen Reactions  . Lisinopril Cough  . Tetracyclines & Related Nausea Only    Severe stomach cramps    Medications Prior to Admission  Medication Sig Dispense Refill  . aspirin 81 MG tablet Take 81 mg by mouth daily.      Marland Kitchen glimepiride (AMARYL) 2 MG tablet Take 2 mg by mouth daily before breakfast.      . insulin glargine (LANTUS) 100 UNIT/ML injection Inject 15 Units into the skin at bedtime.      Marland Kitchen levothyroxine (SYNTHROID, LEVOTHROID) 150 MCG tablet Take 150 mcg by mouth daily before breakfast.       . losartan (COZAAR) 25 MG tablet Take 25 mg by mouth daily.      . metformin (FORTAMET) 500 MG (OSM) 24 hr tablet Take 500 mg by mouth 3 (three)  times daily.       . metoprolol (LOPRESSOR) 50 MG tablet TAKE 1 TABLET (50 MG TOTAL) BY MOUTH 2 (TWO) TIMES DAILY.  60 tablet  11  . potassium chloride SA (K-DUR,KLOR-CON) 20 MEQ tablet Take 1 tablet (20 mEq total) by mouth daily.  90 tablet  5  . PROCTOZONE-HC 2.5 % rectal cream Place 1 application rectally as needed.       . rosuvastatin (CRESTOR) 20 MG tablet Take 20 mg by mouth every evening.        Results for orders placed during the hospital encounter of 10/25/13 (from the past 48 hour(s))  BASIC METABOLIC PANEL     Status: Abnormal   Collection Time    10/23/13  3:00 PM      Result Value Ref Range   Sodium 138  137 - 147 mEq/L   Potassium 4.7  3.7 - 5.3 mEq/L   Chloride 99  96 - 112 mEq/L   CO2 24  19 - 32 mEq/L   Glucose,  Bld 213 (*) 70 - 99 mg/dL   BUN 12  6 - 23 mg/dL   Creatinine, Ser 0.75  0.50 - 1.10 mg/dL   Calcium 9.1  8.4 - 10.5 mg/dL   GFR calc non Af Amer 79 (*) >90 mL/min   GFR calc Af Amer >90  >90 mL/min   Comment: (NOTE)     The eGFR has been calculated using the CKD EPI equation.     This calculation has not been validated in all clinical situations.     eGFR's persistently <90 mL/min signify possible Chronic Kidney     Disease.    No results found.   Pertinent items are noted in HPI.  Height _0  (1.626 m), weight 167 lb (75.751 kg).  General appearance: alert, cooperative and appears stated age Head: Normocephalic, without obvious abnormality Neck: no JVD Resp: clear to auscultation bilaterally Cardio: regular rate and rhythm, S1, S2 normal, no murmur, click, rub or gallop GI: soft, non-tender; bowel sounds normal; no masses,  no organomegaly Extremities: extremities normal, atraumatic, no cyanosis or edema Pulses: 2+ and symmetric Skin: Skin color, texture, turgor normal. No rashes or lesions Neurologic: Grossly normal Incision/Wound: na  Assessment/Plan She is scheduled for a left carpal tunnel release with release A-1 pulley left middle finger as an outpatient under regional anesthesia.   Jeremie Giangrande R 10/25/2013, 7:34 AM

## 2013-10-25 NOTE — Discharge Instructions (Addendum)

## 2013-10-25 NOTE — Anesthesia Procedure Notes (Signed)
Procedure Name: MAC Performed by: Baxter Flattery Pre-anesthesia Checklist: Patient identified, Emergency Drugs available, Suction available and Patient being monitored Patient Re-evaluated:Patient Re-evaluated prior to inductionOxygen Delivery Method: Simple face mask Preoxygenation: Pre-oxygenation with 100% oxygen

## 2013-10-25 NOTE — Brief Op Note (Signed)
10/25/2013  9:17 AM  PATIENT:  Jeanella Cara  80 y.o. female  PRE-OPERATIVE DIAGNOSIS:  left carpal tunnel syndrome/stenosing tenosynovitis left middle finger  POST-OPERATIVE DIAGNOSIS:  left carpal tunnel syndrome/stenosing tenosynovitis left middle finger  PROCEDURE:  Procedure(s): LEFT CARPAL TUNNEL RELEASE (Left) RELEASE A-1 PULLEY LEFT MIDDLE FINGER (Left)  SURGEON:  Surgeon(s) and Role:    * Wynonia Sours, MD - Primary  PHYSICIAN ASSISTANT:   ASSISTANTS: none   ANESTHESIA:   local and regional  EBL:  Total I/O In: 550 [I.V.:550] Out: -   BLOOD ADMINISTERED:none  DRAINS: none   LOCAL MEDICATIONS USED:  BUPIVICAINE   SPECIMEN:  No Specimen  DISPOSITION OF SPECIMEN:  N/A  COUNTS:  YES  TOURNIQUET:   Total Tourniquet Time Documented: Forearm (Left) - 24 minutes Total: Forearm (Left) - 24 minutes   DICTATION: .Other Dictation: Dictation Number [id number]  PLAN OF CARE: Discharge to home after PACU  PATIENT DISPOSITION:  PACU - hemodynamically stable.

## 2013-10-25 NOTE — Anesthesia Preprocedure Evaluation (Signed)
Anesthesia Evaluation  Patient identified by MRN, date of birth, ID band Patient awake    Reviewed: Allergy & Precautions, H&P , NPO status , Patient's Chart, lab work & pertinent test results, reviewed documented beta blocker date and time   Airway Mallampati: II TM Distance: >3 FB Neck ROM: Full    Dental no notable dental hx. (+) Teeth Intact, Dental Advisory Given   Pulmonary neg pulmonary ROS,  breath sounds clear to auscultation  Pulmonary exam normal       Cardiovascular hypertension, On Medications and On Home Beta Blockers + CAD, + Past MI, + Cardiac Stents and +CHF Rhythm:Regular Rate:Normal     Neuro/Psych negative neurological ROS  negative psych ROS   GI/Hepatic negative GI ROS, Neg liver ROS,   Endo/Other  diabetes, Well Controlled, Type 1, Insulin DependentHypothyroidism   Renal/GU negative Renal ROS  negative genitourinary   Musculoskeletal   Abdominal   Peds  Hematology negative hematology ROS (+)   Anesthesia Other Findings   Reproductive/Obstetrics negative OB ROS                           Anesthesia Physical Anesthesia Plan  ASA: III  Anesthesia Plan: MAC and Bier Block   Post-op Pain Management:    Induction: Intravenous  Airway Management Planned: Simple Face Mask  Additional Equipment:   Intra-op Plan:   Post-operative Plan:   Informed Consent: I have reviewed the patients History and Physical, chart, labs and discussed the procedure including the risks, benefits and alternatives for the proposed anesthesia with the patient or authorized representative who has indicated his/her understanding and acceptance.   Dental advisory given  Plan Discussed with: CRNA  Anesthesia Plan Comments:         Anesthesia Quick Evaluation

## 2013-10-30 ENCOUNTER — Encounter (HOSPITAL_BASED_OUTPATIENT_CLINIC_OR_DEPARTMENT_OTHER): Payer: Self-pay | Admitting: Orthopedic Surgery

## 2013-12-22 ENCOUNTER — Other Ambulatory Visit: Payer: Self-pay | Admitting: Cardiology

## 2014-01-16 ENCOUNTER — Other Ambulatory Visit: Payer: Self-pay

## 2014-01-16 DIAGNOSIS — Z1231 Encounter for screening mammogram for malignant neoplasm of breast: Secondary | ICD-10-CM

## 2014-02-14 ENCOUNTER — Ambulatory Visit
Admission: RE | Admit: 2014-02-14 | Discharge: 2014-02-14 | Disposition: A | Discharge status: Home / Self Care | Payer: Medicare Other | Source: Ambulatory Visit

## 2014-02-14 DIAGNOSIS — Z1231 Encounter for screening mammogram for malignant neoplasm of breast: Secondary | ICD-10-CM

## 2014-02-15 ENCOUNTER — Ambulatory Visit: Payer: 59

## 2014-03-05 ENCOUNTER — Encounter (INDEPENDENT_AMBULATORY_CARE_PROVIDER_SITE_OTHER): Payer: Self-pay | Admitting: General Surgery

## 2014-03-05 ENCOUNTER — Ambulatory Visit (INDEPENDENT_AMBULATORY_CARE_PROVIDER_SITE_OTHER): Payer: Medicare Other | Admitting: General Surgery

## 2014-03-05 VITALS — BP 128/70 | HR 74 | Temp 98.0°F | Resp 18 | Ht 64.0 in | Wt 166.0 lb

## 2014-03-05 DIAGNOSIS — K648 Other hemorrhoids: Secondary | ICD-10-CM

## 2014-03-05 NOTE — Progress Notes (Signed)
Chief Complaint  Patient presents with  . Establish Care    bleeding hems    HISTORY: Alicia Medina is a 80 y.o. female who presents to the office with rectal bleeding.  Other symptoms include occasional pain.  This had been occurring for several years.  she has tried banding and suppositories and creams in the past with some success.  Nothing makes the symptoms worse.   It is intermittent in nature.  her bowel habits are irregular and her bowel movements are usually loose.  She takes imodium prn.  her fiber intake is through a supplement.  her last colonoscopy was about a year ago and normal per pt [facility]).  She does report prolapsing tissue and leakage of mucus.     Past Medical History  Diagnosis Date  . Hypothyroidism, postsurgical   . Hypertension   . Hyperlipidemia   . Diabetes mellitus   . Diastolic heart failure   . Blood loss anemia   . Wears hearing aid     both ears  . CAD (coronary artery disease) Feb 2013    Inferior STEMI with BMS to the RCA, complicated by cardiogenic shock, VF, VDRF , temporary CHB and abrupt reocclusion of the RCA stent treated with a second BMS to the RCA. Has residual LAD disease. EF is 50%  . Arthritis   . CHF (congestive heart failure)       Past Surgical History  Procedure Laterality Date  . Total thyroidectomy  October 2008  . Coronary stent placement  Feb 2013    BMS to the RCA with abrupt reocclusion treated with 2nd stent  . Cardiac catheterization  2007    cataracts-both  . Carpal tunnel release Right 01/25/2013    Procedure: CARPAL TUNNEL RELEASE;  Surgeon: Wynonia Sours, MD;  Location: [facility];  Service: Orthopedics;  Laterality: Right;  . Trigger finger release Right 01/25/2013    Procedure: RELEASE TRIGGER FINGER/A-1 PULLEY RIGHT INDEX FINGER;  Surgeon: Wynonia Sours, MD;  Location: [place] [facility];  Service: Orthopedics;  Laterality: Right;  . Carpal tunnel release Left 10/25/2013    Procedure: LEFT CARPAL  TUNNEL RELEASE;  Surgeon: Wynonia Sours, MD;  Location: [facility];  Service: Orthopedics;  Laterality: Left;  . Trigger finger release Left 10/25/2013    Procedure: RELEASE A-1 PULLEY LEFT MIDDLE FINGER;  Surgeon: Wynonia Sours, MD;  Location: [facility];  Service: Orthopedics;  Laterality: Left;        Current Outpatient Prescriptions  Medication Sig Dispense Refill  . aspirin 81 MG tablet Take 81 mg by mouth daily.      Marland Kitchen glimepiride (AMARYL) 2 MG tablet Take 2 mg by mouth daily before breakfast.      . insulin glargine (LANTUS) 100 UNIT/ML injection Inject 15 Units into the skin at bedtime.      Marland Kitchen levothyroxine (SYNTHROID, LEVOTHROID) 150 MCG tablet Take 150 mcg by mouth daily before breakfast.       . losartan (COZAAR) 25 MG tablet Take 25 mg by mouth daily.      . metformin (FORTAMET) 500 MG (OSM) 24 hr tablet Take 500 mg by mouth 3 (three) times daily.       . metoprolol (LOPRESSOR) 50 MG tablet TAKE 1 TABLET (50 MG TOTAL) BY MOUTH 2 (TWO) TIMES DAILY.  60 tablet  11  . potassium chloride SA (K-DUR,KLOR-CON) 20 MEQ tablet TAKE 1 TABLET BY MOUTH DAILY.  90 tablet  1  . PROCTOZONE-HC 2.5 % rectal cream Place 1 application rectally as needed.       . rosuvastatin (CRESTOR) 20 MG tablet Take 20 mg by mouth every evening.      Marland Kitchen HYDROcodone-acetaminophen (NORCO) 5-325 MG per tablet Take 1 tablet by mouth every 6 (six) hours as needed for moderate pain.  30 tablet  0   No current facility-administered medications for this visit.      Allergies  Allergen Reactions  . Lisinopril Cough  . Tetracyclines & Related Nausea Only    Severe stomach cramps      Family History  Problem Relation Age of Onset  . Stroke Mother   . Cancer Mother     breast  . Cancer Father     lung  . Stroke Father     History   Social History  . Marital Status: Married    Spouse Name: N/A    Number of Children: N/A  . Years of Education: N/A   Social History Main Topics  .  Smoking status: Never Smoker   . Smokeless tobacco: Never Used  . Alcohol Use: 0.6 oz/week    1 Glasses of wine per week     Comment: occ  . Drug Use: No  . Sexual Activity: No   Other Topics Concern  . None   Social History Narrative   Lives with husband.   Has 2 sons- both healthy.   Younger son lives in [place], [place] in order son lives in [place].      REVIEW OF SYSTEMS - PERTINENT POSITIVES ONLY: Review of Systems - General ROS: negative for - chills, fever or weight loss Hematological and Lymphatic ROS: negative for - bleeding problems, blood clots or bruising Respiratory ROS: no cough, shortness of breath, or wheezing Cardiovascular ROS: no chest pain or dyspnea on exertion Gastrointestinal ROS: no abdominal pain, change in bowel habits, or black or bloody stools Genito-Urinary ROS: no dysuria, trouble voiding, or hematuria  EXAM: Filed Vitals:   03/05/14 1137  BP: 128/70  Pulse: 74  Temp: 98 F (36.7 C)  Resp: 18    General appearance: alert and cooperative Resp: clear to auscultation bilaterally Cardio: regular rate and rhythm GI: soft, non-tender; bowel sounds normal; no masses,  no organomegaly  Procedure: Anoscopy Surgeon: Marcello Moores Diagnosis: rectal bleeding  Assistant: Hendricks After the risks and benefits were explained, verbal consent was obtained for above procedure  Anesthesia: none Findings: Grade 3 LL hem, Grade 2 RA and RP hemorrhoids, good rectal tone, weak squeeze    ASSESSMENT AND PLAN: Alicia Medina is a 80 y.o. female with bleeding internal hemorrhoids.  She also reports a mucous-like drainage. We discussed performing a trans hemorrhoidal dearterialization of her internal hemorrhoids. This will most likely get rid of her rectal bleeding and should address her mucosal drainage. Given her weak pelvic floor, I did discuss with her the risk of her fecal incontinence getting worse, and internal hemorrhoids contribute approximately 20% to  continence.  We discussed the risk of surgery which include bleeding, infection and recurrence. All questions were answered. The patient would like to schedule the surgery in about a month.    Rosario Adie, MD Colon and Rectal Surgery / [facility]      Visit Diagnoses: 1. Internal bleeding hemorrhoids     Primary Care Physician: Tamsen Roers, MD

## 2014-03-05 NOTE — Patient Instructions (Signed)

## 2014-03-07 ENCOUNTER — Other Ambulatory Visit: Payer: Self-pay | Admitting: Cardiology

## 2014-03-20 ENCOUNTER — Other Ambulatory Visit (INDEPENDENT_AMBULATORY_CARE_PROVIDER_SITE_OTHER): Payer: Medicare Other

## 2014-03-20 ENCOUNTER — Other Ambulatory Visit: Payer: Self-pay

## 2014-03-20 DIAGNOSIS — E785 Hyperlipidemia, unspecified: Secondary | ICD-10-CM

## 2014-03-20 DIAGNOSIS — I252 Old myocardial infarction: Secondary | ICD-10-CM

## 2014-03-20 LAB — BASIC METABOLIC PANEL
BUN: 14 mg/dL (ref 6–23)
CHLORIDE: 102 meq/L (ref 96–112)
CO2: 28 mEq/L (ref 19–32)
CREATININE: 0.9 mg/dL (ref 0.4–1.2)
Calcium: 9.8 mg/dL (ref 8.4–10.5)
GFR: 64.17 mL/min (ref >60.00)
GLUCOSE: 109 mg/dL — AB (ref 70–99)
Potassium: 4.5 mEq/L (ref 3.5–5.1)
Sodium: 139 mEq/L (ref 135–145)

## 2014-03-20 LAB — HEPATIC FUNCTION PANEL
ALT: 20 U/L (ref 0–35)
AST: 24 U/L (ref 0–37)
Albumin: 3.9 g/dL (ref 3.5–5.2)
Alkaline Phosphatase: 41 U/L (ref 39–117)
BILIRUBIN DIRECT: 0 mg/dL (ref 0.0–0.3)
BILIRUBIN TOTAL: 0.3 mg/dL (ref 0.2–1.2)
Total Protein: 7.5 g/dL (ref 6.0–8.3)

## 2014-03-20 LAB — LIPID PANEL
CHOLESTEROL: 126 mg/dL (ref 0–200)
HDL: 44.4 mg/dL (ref >39.00)
LDL Cholesterol: 56 mg/dL (ref 0–99)
NONHDL: 81.6
Total CHOL/HDL Ratio: 3
Triglycerides: 129 mg/dL (ref 0.0–149.0)
VLDL: 25.8 mg/dL (ref 0.0–40.0)

## 2014-03-21 ENCOUNTER — Encounter (HOSPITAL_BASED_OUTPATIENT_CLINIC_OR_DEPARTMENT_OTHER): Payer: Self-pay | Admitting: *Deleted

## 2014-03-23 ENCOUNTER — Encounter (HOSPITAL_BASED_OUTPATIENT_CLINIC_OR_DEPARTMENT_OTHER): Payer: Self-pay | Admitting: *Deleted

## 2014-03-23 ENCOUNTER — Telehealth: Payer: Self-pay | Admitting: Cardiology

## 2014-03-23 NOTE — Progress Notes (Signed)
NPO AFTER MN. ARRIVES AT 0915. NEEDS ISTAT. CURRENT IN EKG IN CHART AND EPIC. WILL TAKE METOPROLOL, COZAAR, AND SYNTHROID AM DOS W/ SIPS OF WATER.

## 2014-03-23 NOTE — Telephone Encounter (Signed)
Returned call to patient no answer.LMTC. 

## 2014-03-23 NOTE — Telephone Encounter (Signed)
Returning your call. °

## 2014-03-26 NOTE — Telephone Encounter (Signed)
Received call from patient lab results given. 

## 2014-03-27 ENCOUNTER — Ambulatory Visit (INDEPENDENT_AMBULATORY_CARE_PROVIDER_SITE_OTHER): Payer: Medicare Other | Admitting: Cardiology

## 2014-03-27 ENCOUNTER — Encounter: Payer: Self-pay | Admitting: Cardiology

## 2014-03-27 VITALS — BP 122/56 | HR 68 | Ht 64.0 in | Wt 169.0 lb

## 2014-03-27 DIAGNOSIS — E119 Type 2 diabetes mellitus without complications: Secondary | ICD-10-CM

## 2014-03-27 DIAGNOSIS — I252 Old myocardial infarction: Secondary | ICD-10-CM

## 2014-03-27 DIAGNOSIS — I2510 Atherosclerotic heart disease of native coronary artery without angina pectoris: Secondary | ICD-10-CM

## 2014-03-27 NOTE — Patient Instructions (Signed)
Continue your current therapy  I will see you in 6 months.   

## 2014-03-27 NOTE — Progress Notes (Signed)
Alicia Medina Date of Birth: 05-Dec-1932 Medical Record #[id number]  History of Present Illness: Alicia Medina is seen today for a follow up visit.  She has had an inferior MI treated with BMS to the RCA in February 2013. She required defibrillation multiple times and her hospitalization was complicated by cardiogenic shock, VF, VDRF and CHB requiring a temporary pacemaker. She had abrupt occlusion of the RCA stent that was treated with a 2nd BMS to the RCA.Followup nuclear stress testing in March 2013 showed normal perfusion throughout with ejection fraction of 76%. On followup today she denies any cardiac complaints of chest pain, SOB, or palpitations. She continues to have problems with hemorrhoids and is planning a surgical procedure. She has traveled extensively this year.   Current Outpatient Prescriptions on File Prior to Visit  Medication Sig Dispense Refill  . aspirin 81 MG tablet Take 81 mg by mouth daily.      Marland Kitchen glimepiride (AMARYL) 2 MG tablet Take 2 mg by mouth daily before breakfast.      . insulin glargine (LANTUS) 100 UNIT/ML injection Inject 15 Units into the skin at bedtime.      Marland Kitchen levothyroxine (SYNTHROID, LEVOTHROID) 150 MCG tablet Take 150 mcg by mouth daily before breakfast.      . loperamide (IMODIUM) 2 MG capsule Take by mouth as needed for diarrhea or loose stools.      Marland Kitchen losartan (COZAAR) 25 MG tablet Take 25 mg by mouth every morning.       . metformin (FORTAMET) 500 MG (OSM) 24 hr tablet Take 500 mg by mouth 3 (three) times daily.       . metoprolol (LOPRESSOR) 50 MG tablet TAKE 1 TABLET BY MOUTH 2 TIMES A DAY.  60 tablet  0  . Multiple Vitamin (MULTIVITAMIN) tablet Take 1 tablet by mouth daily.      . potassium chloride SA (K-DUR,KLOR-CON) 20 MEQ tablet TAKE 1 TABLET BY MOUTH DAILY.  90 tablet  1  . PROCTOZONE-HC 2.5 % rectal cream Place 1 application rectally as needed.       . rosuvastatin (CRESTOR) 20 MG tablet Take 20 mg by mouth every evening.      . vitamin C  (ASCORBIC ACID) 500 MG tablet Take 500 mg by mouth daily.       No current facility-administered medications on file prior to visit.    Allergies  Allergen Reactions  . Lisinopril Cough  . Tetracyclines & Related Other (See Comments)    Severe stomach cramps    Past Medical History  Diagnosis Date  . Hypothyroidism, postsurgical   . Hypertension   . Hyperlipidemia   . Wears hearing aid     both ears  . Arthritis   . CAD (coronary artery disease) CARDIOLOGIST-  DR [place]    08-28-2011 Inferior STEMI with BMS to the RCA, complicated by cardiogenic shock, VF, VDRF , temporary CHB and abrupt reocclusion of the RCA stent treated with a second BMS to the RCA. Has residual LAD disease. EF is 50%  . Type 2 diabetes mellitus   . Hemorrhoids, internal, with bleeding   . S/p bare metal coronary artery stent     2013  . Diverticulosis of colon     Past Surgical History  Procedure Laterality Date  . Total thyroidectomy Bilateral 05-02-2007    multinodular goiter  . Carpal tunnel release Right 01/25/2013    Procedure: CARPAL TUNNEL RELEASE;  Surgeon: Alicia Sours, MD;  Location: [facility];  Service: Orthopedics;  Laterality: Right;  . Trigger finger release Right 01/25/2013    Procedure: RELEASE TRIGGER FINGER/A-1 PULLEY RIGHT INDEX FINGER;  Surgeon: Alicia Sours, MD;  Location: [place];  Service: Orthopedics;  Laterality: Right;  . Carpal tunnel release Left 10/25/2013    Procedure: LEFT CARPAL TUNNEL RELEASE;  Surgeon: Alicia Sours, MD;  Location: [place];  Service: Orthopedics;  Laterality: Left;  . Trigger finger release Left 10/25/2013    Procedure: RELEASE A-1 PULLEY LEFT MIDDLE FINGER;  Surgeon: Alicia Sours, MD;  Location: [place];  Service: Orthopedics;  Laterality: Left;  . Cataract extraction w/ intraocular lens  implant, bilateral  2007  . Coronary angioplasty with stent placement  08-28-2011  dr [place]    BMS to  [facility] with abrupt reocclusion treated with second BMS/  LAD ostial 90%, proximal50-60%, first diagonal 80-90%  . Transthoracic echocardiogram  08-30-2011    mild LVH/ ef 95-28%/  grade I diastolic dysfunction  . Cardiovascular stress test  10-03-2011  dr [place]    normal perfusion / no ischemia/ ef 77%  . Colonoscopy  04-27-2003    History  Smoking status  . Never Smoker   Smokeless tobacco  . Never Used    History  Alcohol Use  . 0.6 oz/week  . 1 Glasses of wine per week    Comment: occ    Family History  Problem Relation Age of Onset  . Stroke Mother   . Cancer Mother     breast  . Cancer Father     lung  . Stroke Father     Review of Systems: The review of systems is per the HPI.  All other systems were reviewed and are negative.  Physical Exam: BP 122/56  Pulse 68  Ht 5\' 4"  (1.626 m)  Wt 169 lb (76.658 kg)  BMI 28.99 kg/m2 Patient is very pleasant and in no acute distress.  Skin is warm and dry. Color is normal.  HEENT is unremarkable. Normocephalic/atraumatic. PERRL. Sclera are nonicteric. Neck is supple. No masses. No JVD. Lungs are clear. Cardiac exam shows a regular rate and rhythm. Abdomen is soft. Extremities are without edema. Gait and ROM are intact. No gross neurologic deficits noted.  LABORATORY DATA: Lab Results  Component Value Date   WBC 6.9 09/21/2011   HGB 12.0 10/25/2013   HCT 36.3 09/21/2011   PLT 320.0 09/21/2011   GLUCOSE 109* 03/20/2014   CHOL 126 03/20/2014   TRIG 129.0 03/20/2014   HDL 44.40 03/20/2014   LDLCALC 56 03/20/2014   ALT 20 03/20/2014   AST 24 03/20/2014   NA 139 03/20/2014   K 4.5 03/20/2014   CL 102 03/20/2014   CREATININE 0.9 03/20/2014   BUN 14 03/20/2014   CO2 28 03/20/2014   TSH 1.552 08/28/2011   INR 1.31 09/01/2011   HGBA1C 7.3* 08/28/2011    Assessment / Plan: 1. Status post acute inferior myocardial infarction in February 2013 with multiple complications. Clinically she has done very well. Followup nuclear stress test was  normal.  Continue ASA. Follow up in 6 months. She is cleared from a cardiac standpoint if she requires surgery for her hemorrhoids.  2. Hyperlipidemia, well controlled on Crestor.  3. Hypertension, controlled.  4. Diabetes mellitus type 2 now on insulin.

## 2014-03-28 ENCOUNTER — Encounter (HOSPITAL_BASED_OUTPATIENT_CLINIC_OR_DEPARTMENT_OTHER): Payer: Medicare Other | Admitting: Anesthesiology

## 2014-03-28 ENCOUNTER — Encounter (HOSPITAL_BASED_OUTPATIENT_CLINIC_OR_DEPARTMENT_OTHER)
Admission: RE | Disposition: A | Discharge status: Home / Self Care | Payer: Self-pay | Source: Ambulatory Visit | Attending: General Surgery

## 2014-03-28 ENCOUNTER — Ambulatory Visit (HOSPITAL_BASED_OUTPATIENT_CLINIC_OR_DEPARTMENT_OTHER): Payer: Medicare Other | Admitting: Anesthesiology

## 2014-03-28 ENCOUNTER — Encounter (HOSPITAL_BASED_OUTPATIENT_CLINIC_OR_DEPARTMENT_OTHER): Payer: Self-pay | Admitting: Anesthesiology

## 2014-03-28 ENCOUNTER — Ambulatory Visit (HOSPITAL_BASED_OUTPATIENT_CLINIC_OR_DEPARTMENT_OTHER)
Admission: RE | Admit: 2014-03-28 | Discharge: 2014-03-28 | Disposition: A | Discharge status: Home / Self Care | Payer: Medicare Other | Source: Ambulatory Visit | Attending: General Surgery | Admitting: General Surgery

## 2014-03-28 DIAGNOSIS — E785 Hyperlipidemia, unspecified: Secondary | ICD-10-CM | POA: Diagnosis not present

## 2014-03-28 DIAGNOSIS — Z794 Long term (current) use of insulin: Secondary | ICD-10-CM | POA: Diagnosis not present

## 2014-03-28 DIAGNOSIS — I2510 Atherosclerotic heart disease of native coronary artery without angina pectoris: Secondary | ICD-10-CM | POA: Insufficient documentation

## 2014-03-28 DIAGNOSIS — N8110 Cystocele, unspecified: Secondary | ICD-10-CM | POA: Insufficient documentation

## 2014-03-28 DIAGNOSIS — Z9861 Coronary angioplasty status: Secondary | ICD-10-CM | POA: Diagnosis not present

## 2014-03-28 DIAGNOSIS — E890 Postprocedural hypothyroidism: Secondary | ICD-10-CM | POA: Insufficient documentation

## 2014-03-28 DIAGNOSIS — K648 Other hemorrhoids: Secondary | ICD-10-CM | POA: Diagnosis present

## 2014-03-28 DIAGNOSIS — I252 Old myocardial infarction: Secondary | ICD-10-CM | POA: Insufficient documentation

## 2014-03-28 DIAGNOSIS — Z79899 Other long term (current) drug therapy: Secondary | ICD-10-CM | POA: Diagnosis not present

## 2014-03-28 DIAGNOSIS — Z7982 Long term (current) use of aspirin: Secondary | ICD-10-CM | POA: Diagnosis not present

## 2014-03-28 DIAGNOSIS — E119 Type 2 diabetes mellitus without complications: Secondary | ICD-10-CM | POA: Diagnosis not present

## 2014-03-28 DIAGNOSIS — M129 Arthropathy, unspecified: Secondary | ICD-10-CM | POA: Insufficient documentation

## 2014-03-28 DIAGNOSIS — I10 Essential (primary) hypertension: Secondary | ICD-10-CM | POA: Insufficient documentation

## 2014-03-28 HISTORY — DX: Presence of coronary angioplasty implant and graft: Z95.5

## 2014-03-28 HISTORY — PX: TRANSANAL HEMORRHOIDAL DEARTERIALIZATION: SHX6136

## 2014-03-28 HISTORY — DX: Diverticulosis of large intestine without perforation or abscess without bleeding: K57.30

## 2014-03-28 HISTORY — DX: Type 2 diabetes mellitus without complications: E11.9

## 2014-03-28 HISTORY — DX: Other hemorrhoids: K64.8

## 2014-03-28 LAB — GLUCOSE, CAPILLARY
Glucose-Capillary: 120 mg/dL — ABNORMAL HIGH (ref 70–99)
Glucose-Capillary: 119 mg/dL — ABNORMAL HIGH (ref 70–99)

## 2014-03-28 SURGERY — TRANSANAL HEMORRHOIDAL DEARTERIALIZATION
Anesthesia: General | Site: Rectum

## 2014-03-28 MED ORDER — FENTANYL CITRATE 0.05 MG/ML IJ SOLN
INTRAMUSCULAR | Status: DC | PRN
  Administered 2014-03-28 (×2): 25 ug via INTRAVENOUS
  Administered 2014-03-28: 50 ug via INTRAVENOUS

## 2014-03-28 MED ORDER — LACTATED RINGERS IV SOLN
INTRAVENOUS | Status: DC
  Administered 2014-03-28: 10:00:00 via INTRAVENOUS
  Filled 2014-03-28: qty 1000

## 2014-03-28 MED ORDER — OXYCODONE-ACETAMINOPHEN 5-325 MG PO TABS: 1.0000 | ORAL_TABLET | Freq: Four times a day (QID) | ORAL | Status: DC | PRN

## 2014-03-28 MED ORDER — DEXAMETHASONE SODIUM PHOSPHATE 4 MG/ML IJ SOLN
INTRAMUSCULAR | Status: DC | PRN
  Administered 2014-03-28 (×2): 4 mg via INTRAVENOUS

## 2014-03-28 MED ORDER — SODIUM CHLORIDE 0.9 % IJ SOLN
INTRAMUSCULAR | Status: DC | PRN
  Administered 2014-03-28: 10 mL via INTRAVENOUS

## 2014-03-28 MED ORDER — KETOROLAC TROMETHAMINE 30 MG/ML IJ SOLN
INTRAMUSCULAR | Status: DC | PRN
  Administered 2014-03-28: 15 mg via INTRAVENOUS

## 2014-03-28 MED ORDER — EPHEDRINE SULFATE 50 MG/ML IJ SOLN
INTRAMUSCULAR | Status: DC | PRN
  Administered 2014-03-28 (×2): 10 mg via INTRAVENOUS

## 2014-03-28 MED ORDER — PROMETHAZINE HCL 25 MG/ML IJ SOLN
6.2500 mg | INTRAMUSCULAR | Status: DC | PRN
  Filled 2014-03-28: qty 1

## 2014-03-28 MED ORDER — ACETAMINOPHEN 650 MG RE SUPP
650.0000 mg | RECTAL | Status: DC | PRN
  Filled 2014-03-28: qty 1

## 2014-03-28 MED ORDER — SODIUM CHLORIDE 0.9 % IJ SOLN
3.0000 mL | Freq: Two times a day (BID) | INTRAMUSCULAR | Status: DC
  Filled 2014-03-28: qty 3

## 2014-03-28 MED ORDER — MIDAZOLAM HCL 2 MG/2ML IJ SOLN
INTRAMUSCULAR | Status: AC
  Filled 2014-03-28: qty 2

## 2014-03-28 MED ORDER — FENTANYL CITRATE 0.05 MG/ML IJ SOLN
25.0000 ug | INTRAMUSCULAR | Status: DC | PRN
  Filled 2014-03-28: qty 1

## 2014-03-28 MED ORDER — LIDOCAINE HCL (CARDIAC) 20 MG/ML IV SOLN
INTRAVENOUS | Status: DC | PRN
  Administered 2014-03-28: 50 mg via INTRAVENOUS

## 2014-03-28 MED ORDER — SODIUM CHLORIDE 0.9 % IJ SOLN
3.0000 mL | INTRAMUSCULAR | Status: DC | PRN
  Filled 2014-03-28: qty 3

## 2014-03-28 MED ORDER — FENTANYL CITRATE 0.05 MG/ML IJ SOLN
INTRAMUSCULAR | Status: AC
  Filled 2014-03-28: qty 4

## 2014-03-28 MED ORDER — PROPOFOL 10 MG/ML IV BOLUS
INTRAVENOUS | Status: DC | PRN
  Administered 2014-03-28: 120 mg via INTRAVENOUS
  Administered 2014-03-28: 20 mg via INTRAVENOUS

## 2014-03-28 MED ORDER — ONDANSETRON HCL 4 MG/2ML IJ SOLN
INTRAMUSCULAR | Status: DC | PRN
  Administered 2014-03-28: 4 mg via INTRAVENOUS

## 2014-03-28 MED ORDER — ACETAMINOPHEN 325 MG PO TABS
650.0000 mg | ORAL_TABLET | ORAL | Status: DC | PRN
  Filled 2014-03-28: qty 2

## 2014-03-28 MED ORDER — ACETAMINOPHEN 10 MG/ML IV SOLN
INTRAVENOUS | Status: DC | PRN
  Administered 2014-03-28: 1000 mg via INTRAVENOUS

## 2014-03-28 MED ORDER — BUPIVACAINE LIPOSOME 1.3 % IJ SUSP
INTRAMUSCULAR | Status: DC | PRN
  Administered 2014-03-28: 20 mL

## 2014-03-28 MED ORDER — METOCLOPRAMIDE HCL 5 MG/ML IJ SOLN
INTRAMUSCULAR | Status: DC | PRN
  Administered 2014-03-28: 10 mg via INTRAVENOUS

## 2014-03-28 MED ORDER — SODIUM CHLORIDE 0.9 % IV SOLN
250.0000 mL | INTRAVENOUS | Status: DC | PRN
  Filled 2014-03-28: qty 250

## 2014-03-28 MED ORDER — OXYCODONE HCL 5 MG PO TABS
5.0000 mg | ORAL_TABLET | ORAL | Status: DC | PRN
  Filled 2014-03-28: qty 2

## 2014-03-28 SURGICAL SUPPLY — 35 items
BLADE HEX COATED 2.75 (ELECTRODE) ×3 IMPLANT
BRIEF STRETCH FOR OB PAD LRG (UNDERPADS AND DIAPERS) IMPLANT
COVER TABLE BACK 60X90 (DRAPES) ×3 IMPLANT
DRAPE LG THREE QUARTER DISP (DRAPES) ×3 IMPLANT
DRAPE UTILITY XL STRL (DRAPES) ×3 IMPLANT
DRSG PAD ABDOMINAL 8X10 ST (GAUZE/BANDAGES/DRESSINGS) ×2 IMPLANT
ELECT REM PT RETURN 9FT ADLT (ELECTROSURGICAL) ×3
ELECTRODE REM PT RTRN 9FT ADLT (ELECTROSURGICAL) ×1 IMPLANT
GAUZE SPONGE 4X4 12PLY STRL (GAUZE/BANDAGES/DRESSINGS) IMPLANT
GAUZE SPONGE 4X4 16PLY XRAY LF (GAUZE/BANDAGES/DRESSINGS) ×3 IMPLANT
GLOVE BIO SURGEON STRL SZ 6.5 (GLOVE) ×5 IMPLANT
GLOVE BIO SURGEONS STRL SZ 6.5 (GLOVE) ×4
GLOVE BIOGEL PI IND STRL 6.5 (GLOVE) IMPLANT
GLOVE BIOGEL PI IND STRL 7.0 (GLOVE) ×1 IMPLANT
GLOVE BIOGEL PI INDICATOR 6.5 (GLOVE) ×4
GLOVE BIOGEL PI INDICATOR 7.0 (GLOVE) ×2
GOWN STRL REUS W/ TWL LRG LVL3 (GOWN DISPOSABLE) IMPLANT
GOWN STRL REUS W/TWL 2XL LVL3 (GOWN DISPOSABLE) ×3 IMPLANT
GOWN STRL REUS W/TWL LRG LVL3 (GOWN DISPOSABLE) ×6
KIT SLIDE ONE PROLAPS HEMORR (KITS) ×2 IMPLANT
LEGGING LITHOTOMY PAIR STRL (DRAPES) ×3 IMPLANT
LUBRICANT JELLY K Y 4OZ (MISCELLANEOUS) ×3 IMPLANT
NEEDLE HYPO 22GX1.5 SAFETY (NEEDLE) ×3 IMPLANT
PACK BASIN DAY SURGERY FS (CUSTOM PROCEDURE TRAY) ×3 IMPLANT
PENCIL BUTTON HOLSTER BLD 10FT (ELECTRODE) ×3 IMPLANT
SPONGE GAUZE 4X4 12PLY STER LF (GAUZE/BANDAGES/DRESSINGS) ×2 IMPLANT
SYR CONTROL 10ML LL (SYRINGE) ×3 IMPLANT
SYRINGE 20CC LL (MISCELLANEOUS) ×3 IMPLANT
THD, THD SLIDE ×2 IMPLANT
TOWEL OR 17X24 6PK STRL BLUE (TOWEL DISPOSABLE) ×5 IMPLANT
TOWEL OR NON WOVEN STRL DISP B (DISPOSABLE) ×3 IMPLANT
TRAY DSU PREP LF (CUSTOM PROCEDURE TRAY) ×3 IMPLANT
TUBE CONNECTING 12'X1/4 (SUCTIONS) ×1
TUBE CONNECTING 12X1/4 (SUCTIONS) ×2 IMPLANT
YANKAUER SUCT BULB TIP NO VENT (SUCTIONS) ×3 IMPLANT

## 2014-03-28 NOTE — Anesthesia Preprocedure Evaluation (Addendum)
Anesthesia Evaluation  Patient identified by MRN, date of birth, ID band Patient awake    Reviewed: Allergy & Precautions, H&P , NPO status , Patient's Chart, lab work & pertinent test results  Airway Mallampati: II TM Distance: >3 FB Neck ROM: Full    Dental no notable dental hx.    Pulmonary neg pulmonary ROS,  breath sounds clear to auscultation  Pulmonary exam normal       Cardiovascular hypertension, Pt. on medications and Pt. on home beta blockers + CAD, + Past MI and + Cardiac Stents Rhythm:Regular Rate:Normal  Stent RCA 2013, last cardiology visit March 2015. Cleared by cardiology   Neuro/Psych negative neurological ROS  negative psych ROS   GI/Hepatic negative GI ROS, Neg liver ROS,   Endo/Other  diabetes, Type 2, Oral Hypoglycemic AgentsHypothyroidism   Renal/GU negative Renal ROS  negative genitourinary   Musculoskeletal  (+) Arthritis -,   Abdominal   Peds negative pediatric ROS (+)  Hematology negative hematology ROS (+)   Anesthesia Other Findings   Reproductive/Obstetrics negative OB ROS                          Anesthesia Physical Anesthesia Plan  ASA: III  Anesthesia Plan: General   Post-op Pain Management:    Induction: Intravenous  Airway Management Planned: LMA  Additional Equipment:   Intra-op Plan:   Post-operative Plan: Extubation in OR  Informed Consent: I have reviewed the patients History and Physical, chart, labs and discussed the procedure including the risks, benefits and alternatives for the proposed anesthesia with the patient or authorized representative who has indicated his/her understanding and acceptance.   Dental advisory given  Plan Discussed with: CRNA  Anesthesia Plan Comments:        Anesthesia Quick Evaluation

## 2014-03-28 NOTE — Anesthesia Procedure Notes (Signed)
Procedure Name: LMA Insertion Date/Time: 03/28/2014 10:14 AM Performed by: Mechele Claude Pre-anesthesia Checklist: Patient identified, Emergency Drugs available, Suction available and Patient being monitored Patient Re-evaluated:Patient Re-evaluated prior to inductionOxygen Delivery Method: Circle System Utilized Preoxygenation: Pre-oxygenation with 100% oxygen Intubation Type: IV induction Ventilation: Mask ventilation without difficulty LMA: LMA inserted LMA Size: 4.0 Number of attempts: 1 Airway Equipment and Method: bite block Placement Confirmation: positive ETCO2 Tube secured with: Tape Dental Injury: Teeth and Oropharynx as per pre-operative assessment

## 2014-03-28 NOTE — Discharge Instructions (Addendum)
ANORECTAL SURGERY: POST OP INSTRUCTIONS °1. Take your usually prescribed home medications unless otherwise directed. °2. DIET: During the first few hours after surgery sip on some liquids until you are able to urinate.  It is normal to not urinate for several hours after this surgery.  If you feel uncomfortable, please contact the office for instructions.  After you are able to urinate,you may eat, if you feel like it.  Follow a light bland diet the first 24 hours after arrival home, such as soup, liquids, crackers, etc.  Be sure to include lots of fluids daily (6-8 glasses).  Avoid fast food or heavy meals, as your are more likely to get nauseated.  Eat a low fat diet the next few days after surgery.  Limit caffeine intake to 1-2 servings a day. °3. PAIN CONTROL: °a. Pain is best controlled by a usual combination of several different methods TOGETHER: °i. Muscle relaxation °1.  Soak in a warm bath (or Sitz bath) three times a day and after bowel movements.  Continue to do this until all pain is resolved. °ii. Over the counter pain medication °iii. Prescription pain medication °b. Most patients will experience some swelling and discomfort in the anus/rectal area and incisions.  Heat such as warm towels, sitz baths, warm baths, etc to help relax tight/sore spots and speed recovery.  Some people prefer to use ice, especially in the first couple days after surgery, as it may decrease the pain and swelling, or alternate between ice & heat.  Experiment to what works for you.  Swelling and bruising can take several weeks to resolve.  Pain can take even longer to completely resolve. °c. It is helpful to take an over-the-counter pain medication regularly for the first few weeks.  Choose one of the following that works best for you: °i. Naproxen (Aleve, etc)  Two 220mg tabs twice a day °ii. Ibuprofen (Advil, etc) Three 200mg tabs four times a day (every meal & bedtime) °d. A  prescription for pain medication (such as  percocet, oxycodone, hydrocodone, etc) should be given to you upon discharge.  Take your pain medication as prescribed.  °i. If you are having problems/concerns with the prescription medicine (does not control pain, nausea, vomiting, rash, itching, etc), please call us [phone] to see if we need to switch you to a different pain medicine that will work better for you and/or control your side effect better. °ii. If you need a refill on your pain medication, please contact your pharmacy.  They will contact our office to request authorization. Prescriptions will not be filled after 5 pm or on week-ends. °4. KEEP YOUR BOWELS REGULAR and AVOID CONSTIPATION °a. The goal is one to two soft bowel movements a day.  You should at least have a bowel movement every other day. °b. Avoid getting constipated.  Between the surgery and the pain medications, it is common to experience some constipation. This can be very painful after rectal surgery.  Increasing fluid intake and taking a fiber supplement (such as Metamucil, Citrucel, FiberCon, etc) 1-2 times a day regularly will usually help prevent this problem from occurring.  A stool softener like colace is also recommended.  This can be purchased over the counter at your pharmacy.  You can take it up to 3 times a day.  If you do not have a bowel movement after 24 hrs since your surgery, take one does of milk of magnesia.  If you still haven't had a bowel movement 8-12   hours after that dose, take another dose.  If you don't have a bowel movement 48 hrs after surgery, purchase a Fleets enema from the drug store and administer gently per package instructions.  If you still are having trouble with your bowel movements after that, please call the office for further instructions. °c. If you develop diarrhea or have many loose bowel movements, simplify your diet to bland foods & liquids for a few days.  Stop any stool softeners and decrease your fiber supplement.  Switching to mild  anti-diarrheal medications (Kayopectate, Pepto Bismol) can help.  If this worsens or does not improve, please call us. ° °5. Wound Care °a. Remove your bandages before your first bowel movement or 8 hours after surgery.     °b. Remove any wound packing material at this tim,e as well.  You do not need to repack the wound unless instructed otherwise.  Wear an absorbent pad or soft cotton gauze in your underwear to catch any drainage and help keep the area clean. You should change this every 2-3 hours while awake. °c. Keep the area clean and dry.  Bathe / shower every day, especially after bowel movements.  Keep the area clean by showering / bathing over the incision / wound.   It is okay to soak an open wound to help wash it.  Wet wipes or showers / gentle washing after bowel movements is often less traumatic than regular toilet paper. °d. You may have some styrofoam-like soft packing in the rectum which will come out with the first bowel movement.  °e. You will often notice bleeding with bowel movements.  This should slow down by the end of the first week of surgery °f. Expect some drainage.  This should slow down, too, by the end of the first week of surgery.  Wear an absorbent pad or soft cotton gauze in your underwear until the drainage stops. °g. Do Not sit on a rubber or pillow ring.  This can make you symptoms worse.  You may sit on a soft pillow if needed.  °6. ACTIVITIES as tolerated:   °a. You may resume regular (light) daily activities beginning the next day--such as daily self-care, walking, climbing stairs--gradually increasing activities as tolerated.  If you can walk 30 minutes without difficulty, it is safe to try more intense activity such as jogging, treadmill, bicycling, low-impact aerobics, swimming, etc. °b. Save the most intensive and strenuous activity for last such as sit-ups, heavy lifting, contact sports, etc  Refrain from any heavy lifting or straining until you are off narcotics for pain  control.   °c. You may drive when you are no longer taking prescription pain medication, you can comfortably sit for long periods of time, and you can safely maneuver your car and apply brakes. °d. You may have sexual intercourse when it is comfortable.  °7. FOLLOW UP in our office °a. Please call [facility] at [phone] to set up an appointment to see your surgeon in the office for a follow-up appointment approximately 3-4 weeks after your surgery. °b. Make sure that you call for this appointment the day you arrive home to insure a convenient appointment time. °10. IF YOU HAVE DISABILITY OR FAMILY LEAVE FORMS, BRING THEM TO THE OFFICE FOR PROCESSING.  DO NOT GIVE THEM TO YOUR DOCTOR. ° ° ° ° °WHEN TO CALL US [phone]: °1. Poor pain control °2. Reactions / problems with new medications (rash/itching, nausea, etc)  °3. Fever over 101.5 F (38.5   C) 4. Inability to urinate 5. Nausea and/or vomiting 6. Worsening swelling or bruising 7. Continued bleeding from incision. 8. Increased pain, redness, or drainage from the incision  The clinic staff is available to answer your questions during regular business hours (8:30am-5pm).  Please dont hesitate to call and ask to speak to one of our nurses for clinical concerns.   A surgeon from [facility] is always on call at the hospitals   If you have a medical emergency, go to the nearest emergency room or call 911.    [facility], [place], [place], [place], [place]  [address] ? MAIN: [phone] ? TOLL FREE: [phone] ? FAX [phone] [email or web address]   Information for Discharge Teaching: EXPAREL (bupivacaine liposome injectable suspension)   Your surgeon gave you EXPAREL(bupivacaine) in your surgical incision to help control your pain after surgery.   EXPAREL is a local anesthetic that provides pain relief by numbing the tissue around the surgical site.  EXPAREL is designed to  release pain medication over time and can control pain for up to 72 hours.  Depending on how you respond to EXPAREL, you may require less pain medication during your recovery.  Possible side effects:  Temporary loss of sensation or ability to move in the area where bupivacaine was injected.  Nausea, vomiting, constipation  Rarely, numbness and tingling in your mouth or lips, lightheadedness, or anxiety may occur.  Call your doctor right away if you think you may be experiencing any of these sensations, or if you have other questions regarding possible side effects.  Follow all other discharge instructions given to you by your surgeon or nurse. Eat a healthy diet and drink plenty of water or other fluids.  If you return to the hospital for any reason within 96 hours following the administration of EXPAREL, please inform your health care providers. Post Anesthesia Home Care Instructions  Activity: Get plenty of rest for the remainder of the day. A responsible adult should stay with you for 24 hours following the procedure.  For the next 24 hours, DO NOT: -Drive a car -Paediatric nurse -Drink alcoholic beverages -Take any medication unless instructed by your physician -Make any legal decisions or sign important papers.  Meals: Start with liquid foods such as gelatin or soup. Progress to regular foods as tolerated. Avoid greasy, spicy, heavy foods. If nausea and/or vomiting occur, drink only clear liquids until the nausea and/or vomiting subsides. Call your physician if vomiting continues.  Special Instructions/Symptoms: Your throat may feel dry or sore from the anesthesia or the breathing tube placed in your throat during surgery. If this causes discomfort, gargle with warm salt water. The discomfort should disappear within 24 hours.

## 2014-03-28 NOTE — Op Note (Signed)
03/28/2014  11:12 AM  PATIENT:  Alicia Medina  80 y.o. female  Patient Care Team: Tamsen Roers, MD as PCP - General (Family Medicine)  PRE-OPERATIVE DIAGNOSIS:  Bleeding Interanal Hemorrhoids  POST-OPERATIVE DIAGNOSIS:  Bleeding Interanal Hemorrhoids  PROCEDURE:  TRANSANAL HEMORRHOIDAL DEARTERIALIZATION  SURGEON:  Surgeon(s): Leighton Ruff, MD  ASSISTANT: none   ANESTHESIA:   general  EBL:  Total I/O In: 500 [I.V.:500] Out: -   DRAINS: none   SPECIMEN:  No Specimen  DISPOSITION OF SPECIMEN:  N/A  COUNTS:  YES  PLAN OF CARE: Discharge to home after PACU  PATIENT DISPOSITION:  PACU - hemodynamically stable.  INDICATION: This is a 80 y.o. F with bleeding internal hemorrhoids.  She has failed medical therapy and banding procedures in the past.   OR FINDINGS: Grade 2 and internal hemorrhoids, vaginal prolapse   DESCRIPTION: the patient was identified in the preoperative holding area and taken to the OR where they were laid supine on the operating room table.  General anesthesia was induced without difficulty. SCDs were also noted to be in place prior to the initiation of anesthesia.  The patient was then prepped and draped in the usual sterile fashion.  The patient does have a large vaginal prolapse.     A surgical timeout was performed indicating the correct patient, procedure, positioning and need for preoperative antibiotics.   I did digital rectal examination and then transitioned over to anoscopy to get a sense of the anatomy.  I switched over to the [facility] fiberoptically lit Doppler anocope.   Using the Doppler on the tip of the [place] anoscope, I identified the arterial hemorrhoidal vessels coming in in the classic hexagonal anatomical pattern (right posterior/lateral/anterior, left posterior /lateral/anterior).    I proceeded to ligate the hemorrhoidal arteries. I used a 2-0 Vicryl suture on a UR-6 needle in a figure-of-eight fashion over the signal around 6 cm proximal  to the anal verge. I then ran that stitch longitudinally more distally to the white line of [place]. I then tied that stitch down to cause a hemorrhoidopexy. I did that for all 6 locations.    I redid Doppler anoscopy. I Identified a signal at the right anterior location.  I isolated and ligated this with a figure-of-eight stitch. Signals went away.  At completion of this, all hemorrhoids were reduced into the rectum.  There is no more prolapse. External anatomy looked normal.  There is one small remaining skin tag.  I repeated anoscopy and examination.   Hemostasis was good. A rectal block was placed using Exparel. Patient tolerated the procedure well and is being extubated go to recovery room.  I am about to discuss the patient's status to the family.

## 2014-03-28 NOTE — Transfer of Care (Signed)
Immediate Anesthesia Transfer of Care Note  Patient: Alicia Medina  Procedure(s) Performed: Procedure(s) (LRB): TRANSANAL HEMORRHOIDAL DEARTERIALIZATION (N/A)  Patient Location: PACU  Anesthesia Type: General  Level of Consciousness: awake, alert  and oriented  Airway & Oxygen Therapy: Patient Spontanous Breathing and Patient connected to face mask oxygen  Post-op Assessment: Report given to PACU RN and Post -op Vital signs reviewed and stable  Post vital signs: Reviewed and stable  Complications: No apparent anesthesia complications

## 2014-03-28 NOTE — H&P (View-Only) (Signed)
Chief Complaint  Patient presents with  . Establish Care    bleeding hems    HISTORY: Alicia Medina is a 80 y.o. female who presents to the office with rectal bleeding.  Other symptoms include occasional pain.  This had been occurring for several years.  she has tried banding and suppositories and creams in the past with some success.  Nothing makes the symptoms worse.   It is intermittent in nature.  her bowel habits are irregular and her bowel movements are usually loose.  She takes imodium prn.  her fiber intake is through a supplement.  her last colonoscopy was about a year ago and normal per pt [facility]).  She does report prolapsing tissue and leakage of mucus.     Past Medical History  Diagnosis Date  . Hypothyroidism, postsurgical   . Hypertension   . Hyperlipidemia   . Diabetes mellitus   . Diastolic heart failure   . Blood loss anemia   . Wears hearing aid     both ears  . CAD (coronary artery disease) Feb 2013    Inferior STEMI with BMS to the RCA, complicated by cardiogenic shock, VF, VDRF , temporary CHB and abrupt reocclusion of the RCA stent treated with a second BMS to the RCA. Has residual LAD disease. EF is 50%  . Arthritis   . CHF (congestive heart failure)       Past Surgical History  Procedure Laterality Date  . Total thyroidectomy  October 2008  . Coronary stent placement  Feb 2013    BMS to the RCA with abrupt reocclusion treated with 2nd stent  . Cardiac catheterization  2007    cataracts-both  . Carpal tunnel release Right 01/25/2013    Procedure: CARPAL TUNNEL RELEASE;  Surgeon: Wynonia Sours, MD;  Location: [place];  Service: Orthopedics;  Laterality: Right;  . Trigger finger release Right 01/25/2013    Procedure: RELEASE TRIGGER FINGER/A-1 PULLEY RIGHT INDEX FINGER;  Surgeon: Wynonia Sours, MD;  Location: [place];  Service: Orthopedics;  Laterality: Right;  . Carpal tunnel release Left 10/25/2013    Procedure: LEFT CARPAL  TUNNEL RELEASE;  Surgeon: Wynonia Sours, MD;  Location: [place];  Service: Orthopedics;  Laterality: Left;  . Trigger finger release Left 10/25/2013    Procedure: RELEASE A-1 PULLEY LEFT MIDDLE FINGER;  Surgeon: Wynonia Sours, MD;  Location: [place];  Service: Orthopedics;  Laterality: Left;        Current Outpatient Prescriptions  Medication Sig Dispense Refill  . aspirin 81 MG tablet Take 81 mg by mouth daily.      Marland Kitchen glimepiride (AMARYL) 2 MG tablet Take 2 mg by mouth daily before breakfast.      . insulin glargine (LANTUS) 100 UNIT/ML injection Inject 15 Units into the skin at bedtime.      Marland Kitchen levothyroxine (SYNTHROID, LEVOTHROID) 150 MCG tablet Take 150 mcg by mouth daily before breakfast.       . losartan (COZAAR) 25 MG tablet Take 25 mg by mouth daily.      . metformin (FORTAMET) 500 MG (OSM) 24 hr tablet Take 500 mg by mouth 3 (three) times daily.       . metoprolol (LOPRESSOR) 50 MG tablet TAKE 1 TABLET (50 MG TOTAL) BY MOUTH 2 (TWO) TIMES DAILY.  60 tablet  11  . potassium chloride SA (K-DUR,KLOR-CON) 20 MEQ tablet TAKE 1 TABLET BY MOUTH DAILY.  90 tablet  1  . PROCTOZONE-HC 2.5 % rectal cream Place 1 application rectally as needed.       . rosuvastatin (CRESTOR) 20 MG tablet Take 20 mg by mouth every evening.      Marland Kitchen HYDROcodone-acetaminophen (NORCO) 5-325 MG per tablet Take 1 tablet by mouth every 6 (six) hours as needed for moderate pain.  30 tablet  0   No current facility-administered medications for this visit.      Allergies  Allergen Reactions  . Lisinopril Cough  . Tetracyclines & Related Nausea Only    Severe stomach cramps      Family History  Problem Relation Age of Onset  . Stroke Mother   . Cancer Mother     breast  . Cancer Father     lung  . Stroke Father     History   Social History  . Marital Status: Married    Spouse Name: N/A    Number of Children: N/A  . Years of Education: N/A   Social History Main Topics  .  Smoking status: Never Smoker   . Smokeless tobacco: Never Used  . Alcohol Use: 0.6 oz/week    1 Glasses of wine per week     Comment: occ  . Drug Use: No  . Sexual Activity: No   Other Topics Concern  . None   Social History Narrative   Lives with husband.   Has 2 sons- both healthy.   Younger son lives in [place], [place] in order son lives in [place].      REVIEW OF SYSTEMS - PERTINENT POSITIVES ONLY: Review of Systems - General ROS: negative for - chills, fever or weight loss Hematological and Lymphatic ROS: negative for - bleeding problems, blood clots or bruising Respiratory ROS: no cough, shortness of breath, or wheezing Cardiovascular ROS: no chest pain or dyspnea on exertion Gastrointestinal ROS: no abdominal pain, change in bowel habits, or black or bloody stools Genito-Urinary ROS: no dysuria, trouble voiding, or hematuria  EXAM: Filed Vitals:   03/05/14 1137  BP: 128/70  Pulse: 74  Temp: 98 F (36.7 C)  Resp: 18    General appearance: alert and cooperative Resp: clear to auscultation bilaterally Cardio: regular rate and rhythm GI: soft, non-tender; bowel sounds normal; no masses,  no organomegaly  Procedure: Anoscopy Surgeon: Marcello Moores Diagnosis: rectal bleeding  Assistant: Hendricks After the risks and benefits were explained, verbal consent was obtained for above procedure  Anesthesia: none Findings: Grade 3 LL hem, Grade 2 RA and RP hemorrhoids, good rectal tone, weak squeeze    ASSESSMENT AND PLAN: Alicia Medina is a 80 y.o. female with bleeding internal hemorrhoids.  She also reports a mucous-like drainage. We discussed performing a trans hemorrhoidal dearterialization of her internal hemorrhoids. This will most likely get rid of her rectal bleeding and should address her mucosal drainage. Given her weak pelvic floor, I did discuss with her the risk of her fecal incontinence getting worse, and internal hemorrhoids contribute approximately 20% to  continence.  We discussed the risk of surgery which include bleeding, infection and recurrence. All questions were answered. The patient would like to schedule the surgery in about a month.    Rosario Adie, MD Colon and Rectal Surgery / [facility]      Visit Diagnoses: 1. Internal bleeding hemorrhoids     Primary Care Physician: Tamsen Roers, MD

## 2014-03-28 NOTE — Interval H&P Note (Signed)
History and Physical Interval Note:  03/28/2014 9:39 AM  Alicia Medina  has presented today for surgery, with the diagnosis of Bleeding Interanal Hemorrhoids  The various methods of treatment have been discussed with the patient and family. After consideration of risks, benefits and other options for treatment, the patient has consented to  Procedure(s): TRANSANAL HEMORRHOIDAL DEARTERIALIZATION (N/A) as a surgical intervention .  The patient's history has been reviewed, patient examined, no change in status, stable for surgery.  I have reviewed the patient's chart and labs.  Questions were answered to the patient's satisfaction.     Rosario Adie, MD  [facility] and [facility]

## 2014-03-28 NOTE — Anesthesia Postprocedure Evaluation (Signed)
  Anesthesia Post-op Note  Patient: Alicia Medina  Procedure(s) Performed: Procedure(s) (LRB): TRANSANAL HEMORRHOIDAL DEARTERIALIZATION (N/A)  Patient Location: PACU  Anesthesia Type: general  Level of Consciousness: awake and alert   Airway and Oxygen Therapy: Patient Spontanous Breathing  Post-op Pain: mild  Post-op Assessment: Post-op Vital signs reviewed, Patient's Cardiovascular Status Stable, Respiratory Function Stable, Patent Airway and No signs of Nausea or vomiting  Last Vitals:  Filed Vitals:   03/28/14 1309  BP: 152/61  Pulse: 86  Temp: 36.6 C  Resp: 15    Post-op Vital Signs: stable   Complications: No apparent anesthesia complications

## 2014-03-29 ENCOUNTER — Encounter (HOSPITAL_BASED_OUTPATIENT_CLINIC_OR_DEPARTMENT_OTHER): Payer: Self-pay | Admitting: General Surgery

## 2014-03-31 ENCOUNTER — Other Ambulatory Visit: Payer: Self-pay | Admitting: Cardiology

## 2014-04-13 ENCOUNTER — Other Ambulatory Visit: Payer: Self-pay | Admitting: Cardiology

## 2014-06-18 ENCOUNTER — Other Ambulatory Visit: Payer: Self-pay | Admitting: Cardiology

## 2014-06-26 ENCOUNTER — Telehealth: Payer: Self-pay | Admitting: Cardiology

## 2014-06-27 NOTE — Telephone Encounter (Signed)
Closed encounter °

## 2014-07-05 ENCOUNTER — Encounter (HOSPITAL_COMMUNITY): Payer: Self-pay | Admitting: Cardiology

## 2014-08-08 DIAGNOSIS — H524 Presbyopia: Secondary | ICD-10-CM | POA: Diagnosis not present

## 2014-08-08 DIAGNOSIS — H04123 Dry eye syndrome of bilateral lacrimal glands: Secondary | ICD-10-CM | POA: Diagnosis not present

## 2014-08-08 DIAGNOSIS — H3531 Nonexudative age-related macular degeneration: Secondary | ICD-10-CM | POA: Diagnosis not present

## 2014-08-08 DIAGNOSIS — M65342 Trigger finger, left ring finger: Secondary | ICD-10-CM | POA: Diagnosis not present

## 2014-08-08 DIAGNOSIS — E119 Type 2 diabetes mellitus without complications: Secondary | ICD-10-CM | POA: Diagnosis not present

## 2014-09-04 DIAGNOSIS — N812 Incomplete uterovaginal prolapse: Secondary | ICD-10-CM | POA: Diagnosis not present

## 2014-09-17 ENCOUNTER — Other Ambulatory Visit: Payer: Self-pay | Admitting: *Deleted

## 2014-09-17 ENCOUNTER — Telehealth: Payer: Self-pay | Admitting: Cardiology

## 2014-09-17 DIAGNOSIS — Z79899 Other long term (current) drug therapy: Secondary | ICD-10-CM

## 2014-09-17 NOTE — Telephone Encounter (Signed)
Pt presented to [facility] for routine labwork. No order in. Ordered BMET. She has eaten lunch so I did not order lipid profile.

## 2014-09-17 NOTE — Telephone Encounter (Signed)
Alicia Medina is  Calling because she needs a lab order.

## 2014-09-17 NOTE — Addendum Note (Signed)
Addended by: Theodore Demark on: 09/17/2014 02:43 PM   Modules accepted: Orders

## 2014-09-18 LAB — BASIC METABOLIC PANEL
BUN: 19 mg/dL (ref 6–23)
CALCIUM: 9.4 mg/dL (ref 8.4–10.5)
CO2: 28 mEq/L (ref 19–32)
Chloride: 104 mEq/L (ref 96–112)
Creat: 1 mg/dL (ref 0.50–1.10)
Glucose, Bld: 149 mg/dL — ABNORMAL HIGH (ref 70–99)
Potassium: 4.9 mEq/L (ref 3.5–5.3)
Sodium: 140 mEq/L (ref 135–145)

## 2014-09-19 DIAGNOSIS — M65342 Trigger finger, left ring finger: Secondary | ICD-10-CM | POA: Diagnosis not present

## 2014-09-27 ENCOUNTER — Encounter: Payer: Self-pay | Admitting: Cardiology

## 2014-09-27 ENCOUNTER — Ambulatory Visit (INDEPENDENT_AMBULATORY_CARE_PROVIDER_SITE_OTHER): Payer: Medicare Other | Admitting: Cardiology

## 2014-09-27 VITALS — BP 126/60 | HR 91 | Ht 64.0 in | Wt 165.8 lb

## 2014-09-27 DIAGNOSIS — I252 Old myocardial infarction: Secondary | ICD-10-CM | POA: Diagnosis not present

## 2014-09-27 DIAGNOSIS — E1159 Type 2 diabetes mellitus with other circulatory complications: Secondary | ICD-10-CM | POA: Insufficient documentation

## 2014-09-27 DIAGNOSIS — I2510 Atherosclerotic heart disease of native coronary artery without angina pectoris: Secondary | ICD-10-CM

## 2014-09-27 DIAGNOSIS — I152 Hypertension secondary to endocrine disorders: Secondary | ICD-10-CM | POA: Insufficient documentation

## 2014-09-27 DIAGNOSIS — E785 Hyperlipidemia, unspecified: Secondary | ICD-10-CM | POA: Diagnosis not present

## 2014-09-27 DIAGNOSIS — I10 Essential (primary) hypertension: Secondary | ICD-10-CM

## 2014-09-27 NOTE — Patient Instructions (Signed)
Continue your current therapy  I will see you in 6 months.   

## 2014-09-27 NOTE — Progress Notes (Signed)
Alicia Medina Date of Birth: [birth year] Medical Record #[id number]  History of Present Illness: [name] is seen today for follow up CAD. She has had an inferior MI treated with BMS to the RCA in February 2013. She required defibrillation multiple times and her hospitalization was complicated by cardiogenic shock, VF, VDRF and CHB requiring a temporary pacemaker. She had abrupt occlusion of the RCA stent that was treated with a 2nd BMS to the RCA.Followup nuclear stress testing in March 2013 showed normal perfusion throughout with ejection fraction of 76%. On followup today she denies any cardiac complaints of chest pain, SOB, or palpitations. She continues to have problems with hemorrhoids and bleeding and had a surgical cauterization. She also has a pessary now for bladder prolapse.   Current Outpatient Prescriptions on File Prior to Visit  Medication Sig Dispense Refill  . aspirin 81 MG tablet Take 81 mg by mouth daily.    . CRESTOR 20 MG tablet TAKE 1 TABLET BY MOUTH DAILY. 90 tablet 1  . glimepiride (AMARYL) 2 MG tablet Take 2 mg by mouth daily before breakfast.    . insulin glargine (LANTUS) 100 UNIT/ML injection Inject 15 Units into the skin at bedtime.    Marland Kitchen levothyroxine (SYNTHROID, LEVOTHROID) 150 MCG tablet Take 150 mcg by mouth daily before breakfast.    . loperamide (IMODIUM) 2 MG capsule Take by mouth as needed for diarrhea or loose stools.    Marland Kitchen losartan (COZAAR) 25 MG tablet Take 25 mg by mouth every morning.     . metformin (FORTAMET) 500 MG (OSM) 24 hr tablet Take 500 mg by mouth 3 (three) times daily.     . metoprolol (LOPRESSOR) 50 MG tablet TAKE 1 TABLET BY MOUTH 2 TIMES A DAY. 60 tablet 5  . Multiple Vitamin (MULTIVITAMIN) tablet Take 1 tablet by mouth daily.    . potassium chloride SA (K-DUR,KLOR-CON) 20 MEQ tablet TAKE 1 TABLET BY MOUTH DAILY. 90 tablet 1  . PROCTOZONE-HC 2.5 % rectal cream Place 1 application rectally as needed.     . vitamin C (ASCORBIC ACID) 500 MG  tablet Take 500 mg by mouth daily.     No current facility-administered medications on file prior to visit.    Allergies  Allergen Reactions  . Lisinopril Cough  . Tetracyclines & Related Other (See Comments)    Severe stomach cramps    Past Medical History  Diagnosis Date  . Hypothyroidism, postsurgical   . Hypertension   . Hyperlipidemia   . Wears hearing aid     both ears  . Arthritis   . CAD (coronary artery disease) CARDIOLOGIST-  DR Martinique    08-28-2011 Inferior STEMI with BMS to the RCA, complicated by cardiogenic shock, VF, VDRF , temporary CHB and abrupt reocclusion of the RCA stent treated with a second BMS to the RCA. Has residual LAD disease. EF is 50%  . Type 2 diabetes mellitus   . Hemorrhoids, internal, with bleeding   . S/p bare metal coronary artery stent     2013  . Diverticulosis of colon     Past Surgical History  Procedure Laterality Date  . Total thyroidectomy Bilateral 05-02-2007    multinodular goiter  . Carpal tunnel release Right 01/25/2013    Procedure: CARPAL TUNNEL RELEASE;  Surgeon: Wynonia Sours, MD;  Location: [place];  Service: Orthopedics;  Laterality: Right;  . Trigger finger release Right 01/25/2013    Procedure: RELEASE TRIGGER FINGER/A-1 PULLEY RIGHT INDEX FINGER;  Surgeon: Wynonia Sours, MD;  Location: [place];  Service: Orthopedics;  Laterality: Right;  . Carpal tunnel release Left 10/25/2013    Procedure: LEFT CARPAL TUNNEL RELEASE;  Surgeon: Wynonia Sours, MD;  Location: [place];  Service: Orthopedics;  Laterality: Left;  . Trigger finger release Left 10/25/2013    Procedure: RELEASE A-1 PULLEY LEFT MIDDLE FINGER;  Surgeon: Wynonia Sours, MD;  Location: [place];  Service: Orthopedics;  Laterality: Left;  . Cataract extraction w/ intraocular lens  implant, bilateral  2007  . Coronary angioplasty with stent placement  08-28-2011  dr Martinique    BMS to [facility] with abrupt  reocclusion treated with second BMS/  LAD ostial 90%, proximal50-60%, first diagonal 80-90%  . Transthoracic echocardiogram  08-30-2011    mild LVH/ ef 52-84%/  grade I diastolic dysfunction  . Cardiovascular stress test  10-03-2011  dr Martinique    normal perfusion / no ischemia/ ef 77%  . Colonoscopy  04-27-2003  . Transanal hemorrhoidal dearterialization N/A 03/28/2014    Procedure: TRANSANAL HEMORRHOIDAL DEARTERIALIZATION;  Surgeon: Leighton Ruff, MD;  Location: [facility];  Service: General;  Laterality: N/A;  . Left heart catheterization with coronary angiogram N/A 08/28/2011    Procedure: LEFT HEART CATHETERIZATION WITH CORONARY ANGIOGRAM;  Surgeon: Peter M Martinique, MD;  Location: [facility] CATH LAB;  Service: Cardiovascular;  Laterality: N/A;  . Coronary angiogram Left 08/28/2011    Procedure: CORONARY ANGIOGRAM;  Surgeon: Peter M Martinique, MD;  Location: [facility] CATH LAB;  Service: Cardiovascular;  Laterality: Left;  . Percutaneous coronary stent intervention (pci-s) Left 08/28/2011    Procedure: PERCUTANEOUS CORONARY STENT INTERVENTION (PCI-S);  Surgeon: Peter M Martinique, MD;  Location: [facility] CATH LAB;  Service: Cardiovascular;  Laterality: Left;    History  Smoking status  . Never Smoker   Smokeless tobacco  . Never Used    History  Alcohol Use  . 0.6 oz/week  . 1 Glasses of wine per week    Comment: occ    Family History  Problem Relation Age of Onset  . Stroke Mother   . Cancer Mother     breast  . Cancer Father     lung  . Stroke Father     Review of Systems: The review of systems is per the HPI.  All other systems were reviewed and are negative.  Physical Exam: BP 126/60 mmHg  Pulse 91  Ht 5\' 4"  (1.626 m)  Wt 165 lb 12.8 oz (75.206 kg)  BMI 28.45 kg/m2 Patient is very pleasant and in no acute distress.  Skin is warm and dry. Color is normal.  HEENT is unremarkable. Normocephalic/atraumatic. PERRL. Sclera are nonicteric. Neck is supple. No masses. No JVD. Lungs are  clear. Cardiac exam shows a regular rate and rhythm. Abdomen is soft. Extremities are without edema. Gait and ROM are intact. No gross neurologic deficits noted.  LABORATORY DATA: Lab Results  Component Value Date   WBC 6.9 09/21/2011   HGB 12.0 10/25/2013   HCT 36.3 09/21/2011   PLT 320.0 09/21/2011   GLUCOSE 149* 09/17/2014   CHOL 126 03/20/2014   TRIG 129.0 03/20/2014   HDL 44.40 03/20/2014   LDLCALC 56 03/20/2014   ALT 20 03/20/2014   AST 24 03/20/2014   NA 140 09/17/2014   K 4.9 09/17/2014   CL 104 09/17/2014   CREATININE 1.00 09/17/2014   BUN 19 09/17/2014   CO2 28 09/17/2014   TSH 1.552 08/28/2011  INR 1.31 09/01/2011   HGBA1C 7.3* 08/28/2011   Ecg today shows NSR with possible septal infarct. No acute change. I have personally reviewed and interpreted this study.  Assessment / Plan: 1. Status post acute inferior myocardial infarction in February 2013 with multiple complications. Clinically she has done very well. Followup nuclear stress test March 2013 was normal.  Continue ASA. Follow up in 6 months.   2. Hyperlipidemia, well controlled on Crestor.  3. Hypertension, controlled.  4. Diabetes mellitus type 2 now on insulin.

## 2014-10-16 ENCOUNTER — Other Ambulatory Visit: Payer: Self-pay | Admitting: Cardiology

## 2014-11-13 DIAGNOSIS — N812 Incomplete uterovaginal prolapse: Secondary | ICD-10-CM | POA: Diagnosis not present

## 2014-11-21 DIAGNOSIS — M65342 Trigger finger, left ring finger: Secondary | ICD-10-CM | POA: Diagnosis not present

## 2014-11-23 ENCOUNTER — Other Ambulatory Visit: Payer: Self-pay | Admitting: Orthopedic Surgery

## 2014-12-03 ENCOUNTER — Encounter (HOSPITAL_BASED_OUTPATIENT_CLINIC_OR_DEPARTMENT_OTHER): Payer: Self-pay | Admitting: *Deleted

## 2014-12-03 ENCOUNTER — Encounter (HOSPITAL_BASED_OUTPATIENT_CLINIC_OR_DEPARTMENT_OTHER)
Admission: RE | Admit: 2014-12-03 | Discharge: 2014-12-03 | Disposition: A | Discharge status: Home / Self Care | Payer: Medicare Other | Source: Ambulatory Visit | Attending: Orthopedic Surgery | Admitting: Orthopedic Surgery

## 2014-12-03 DIAGNOSIS — E119 Type 2 diabetes mellitus without complications: Secondary | ICD-10-CM | POA: Diagnosis not present

## 2014-12-03 DIAGNOSIS — I252 Old myocardial infarction: Secondary | ICD-10-CM | POA: Diagnosis not present

## 2014-12-03 DIAGNOSIS — K5730 Diverticulosis of large intestine without perforation or abscess without bleeding: Secondary | ICD-10-CM | POA: Diagnosis not present

## 2014-12-03 DIAGNOSIS — E785 Hyperlipidemia, unspecified: Secondary | ICD-10-CM | POA: Diagnosis not present

## 2014-12-03 DIAGNOSIS — Z888 Allergy status to other drugs, medicaments and biological substances status: Secondary | ICD-10-CM | POA: Diagnosis not present

## 2014-12-03 DIAGNOSIS — Z955 Presence of coronary angioplasty implant and graft: Secondary | ICD-10-CM | POA: Diagnosis not present

## 2014-12-03 DIAGNOSIS — Z951 Presence of aortocoronary bypass graft: Secondary | ICD-10-CM | POA: Diagnosis not present

## 2014-12-03 DIAGNOSIS — I10 Essential (primary) hypertension: Secondary | ICD-10-CM | POA: Diagnosis not present

## 2014-12-03 DIAGNOSIS — E039 Hypothyroidism, unspecified: Secondary | ICD-10-CM | POA: Diagnosis not present

## 2014-12-03 DIAGNOSIS — M65342 Trigger finger, left ring finger: Secondary | ICD-10-CM | POA: Diagnosis not present

## 2014-12-03 LAB — BASIC METABOLIC PANEL
ANION GAP: 7 (ref 5–15)
BUN: 12 mg/dL (ref 6–20)
CALCIUM: 9 mg/dL (ref 8.9–10.3)
CO2: 29 mmol/L (ref 22–32)
Chloride: 100 mmol/L — ABNORMAL LOW (ref 101–111)
Creatinine, Ser: 0.94 mg/dL (ref 0.44–1.00)
GFR calc non Af Amer: 56 mL/min — ABNORMAL LOW (ref >60)
GLUCOSE: 162 mg/dL — AB (ref 70–99)
POTASSIUM: 4.5 mmol/L (ref 3.5–5.1)
SODIUM: 136 mmol/L (ref 135–145)

## 2014-12-03 NOTE — Progress Notes (Signed)
PT in for labs.  BMET & EKG done. EKG reviewed by Dr. Al Corpus.

## 2014-12-04 ENCOUNTER — Ambulatory Visit (HOSPITAL_BASED_OUTPATIENT_CLINIC_OR_DEPARTMENT_OTHER)
Admission: RE | Admit: 2014-12-04 | Discharge: 2014-12-04 | Disposition: A | Discharge status: Home / Self Care | Payer: Medicare Other | Source: Ambulatory Visit | Attending: Orthopedic Surgery | Admitting: Orthopedic Surgery

## 2014-12-04 ENCOUNTER — Ambulatory Visit (HOSPITAL_BASED_OUTPATIENT_CLINIC_OR_DEPARTMENT_OTHER): Payer: Medicare Other | Admitting: Anesthesiology

## 2014-12-04 ENCOUNTER — Encounter (HOSPITAL_BASED_OUTPATIENT_CLINIC_OR_DEPARTMENT_OTHER)
Admission: RE | Disposition: A | Discharge status: Home / Self Care | Payer: Self-pay | Source: Ambulatory Visit | Attending: Orthopedic Surgery

## 2014-12-04 ENCOUNTER — Encounter (HOSPITAL_BASED_OUTPATIENT_CLINIC_OR_DEPARTMENT_OTHER): Payer: Self-pay | Admitting: Orthopedic Surgery

## 2014-12-04 DIAGNOSIS — Z951 Presence of aortocoronary bypass graft: Secondary | ICD-10-CM | POA: Insufficient documentation

## 2014-12-04 DIAGNOSIS — E785 Hyperlipidemia, unspecified: Secondary | ICD-10-CM | POA: Diagnosis not present

## 2014-12-04 DIAGNOSIS — E039 Hypothyroidism, unspecified: Secondary | ICD-10-CM | POA: Diagnosis not present

## 2014-12-04 DIAGNOSIS — Z955 Presence of coronary angioplasty implant and graft: Secondary | ICD-10-CM | POA: Insufficient documentation

## 2014-12-04 DIAGNOSIS — I252 Old myocardial infarction: Secondary | ICD-10-CM | POA: Diagnosis not present

## 2014-12-04 DIAGNOSIS — E119 Type 2 diabetes mellitus without complications: Secondary | ICD-10-CM | POA: Insufficient documentation

## 2014-12-04 DIAGNOSIS — Z888 Allergy status to other drugs, medicaments and biological substances status: Secondary | ICD-10-CM | POA: Insufficient documentation

## 2014-12-04 DIAGNOSIS — M65842 Other synovitis and tenosynovitis, left hand: Secondary | ICD-10-CM | POA: Diagnosis not present

## 2014-12-04 DIAGNOSIS — I10 Essential (primary) hypertension: Secondary | ICD-10-CM | POA: Insufficient documentation

## 2014-12-04 DIAGNOSIS — K5730 Diverticulosis of large intestine without perforation or abscess without bleeding: Secondary | ICD-10-CM | POA: Insufficient documentation

## 2014-12-04 DIAGNOSIS — M65342 Trigger finger, left ring finger: Secondary | ICD-10-CM | POA: Insufficient documentation

## 2014-12-04 HISTORY — PX: TRIGGER FINGER RELEASE: SHX641

## 2014-12-04 LAB — GLUCOSE, CAPILLARY
GLUCOSE-CAPILLARY: 115 mg/dL — AB (ref 70–99)
Glucose-Capillary: 118 mg/dL — ABNORMAL HIGH (ref 70–99)

## 2014-12-04 LAB — POCT HEMOGLOBIN-HEMACUE: Hemoglobin: 11.1 g/dL — ABNORMAL LOW (ref 12.0–15.0)

## 2014-12-04 SURGERY — RELEASE, A1 PULLEY, FOR TRIGGER FINGER
Anesthesia: Regional | Site: Finger | Laterality: Left

## 2014-12-04 MED ORDER — MIDAZOLAM HCL 2 MG/2ML IJ SOLN: 1.0000 mg | INTRAMUSCULAR | Status: DC | PRN

## 2014-12-04 MED ORDER — CHLORHEXIDINE GLUCONATE 4 % EX LIQD: 60.0000 mL | Freq: Once | CUTANEOUS | Status: DC

## 2014-12-04 MED ORDER — FENTANYL CITRATE (PF) 100 MCG/2ML IJ SOLN
INTRAMUSCULAR | Status: DC | PRN
  Administered 2014-12-04 (×2): 50 ug via INTRAVENOUS

## 2014-12-04 MED ORDER — BUPIVACAINE HCL (PF) 0.25 % IJ SOLN
INTRAMUSCULAR | Status: AC
  Filled 2014-12-04: qty 30

## 2014-12-04 MED ORDER — PROPOFOL 500 MG/50ML IV EMUL
INTRAVENOUS | Status: AC
  Filled 2014-12-04: qty 150

## 2014-12-04 MED ORDER — FENTANYL CITRATE (PF) 100 MCG/2ML IJ SOLN: 25.0000 ug | INTRAMUSCULAR | Status: DC | PRN

## 2014-12-04 MED ORDER — CEFAZOLIN SODIUM-DEXTROSE 2-3 GM-% IV SOLR
INTRAVENOUS | Status: AC
  Filled 2014-12-04: qty 50

## 2014-12-04 MED ORDER — PROPOFOL INFUSION 10 MG/ML OPTIME
INTRAVENOUS | Status: DC | PRN
  Administered 2014-12-04: 25 ug/kg/min via INTRAVENOUS

## 2014-12-04 MED ORDER — LACTATED RINGERS IV SOLN
INTRAVENOUS | Status: DC
  Administered 2014-12-04: 08:00:00 via INTRAVENOUS

## 2014-12-04 MED ORDER — CEFAZOLIN SODIUM-DEXTROSE 2-3 GM-% IV SOLR
2.0000 g | INTRAVENOUS | Status: AC
  Administered 2014-12-04: 2 g via INTRAVENOUS

## 2014-12-04 MED ORDER — ONDANSETRON HCL 4 MG/2ML IJ SOLN
INTRAMUSCULAR | Status: DC | PRN
  Administered 2014-12-04: 4 mg via INTRAVENOUS

## 2014-12-04 MED ORDER — LIDOCAINE HCL (PF) 0.5 % IJ SOLN
INTRAMUSCULAR | Status: DC | PRN
  Administered 2014-12-04: 30 mL via INTRAVENOUS

## 2014-12-04 MED ORDER — FENTANYL CITRATE (PF) 100 MCG/2ML IJ SOLN: 50.0000 ug | INTRAMUSCULAR | Status: DC | PRN

## 2014-12-04 MED ORDER — GLYCOPYRROLATE 0.2 MG/ML IJ SOLN: 0.2000 mg | Freq: Once | INTRAMUSCULAR | Status: DC | PRN

## 2014-12-04 MED ORDER — PROPOFOL 10 MG/ML IV BOLUS
INTRAVENOUS | Status: AC
  Filled 2014-12-04: qty 80

## 2014-12-04 MED ORDER — FENTANYL CITRATE (PF) 100 MCG/2ML IJ SOLN
INTRAMUSCULAR | Status: AC
  Filled 2014-12-04: qty 4

## 2014-12-04 MED ORDER — HYDROCODONE-ACETAMINOPHEN 5-325 MG PO TABS: 1.0000 | ORAL_TABLET | Freq: Four times a day (QID) | ORAL | Status: DC | PRN

## 2014-12-04 MED ORDER — BUPIVACAINE HCL (PF) 0.25 % IJ SOLN
INTRAMUSCULAR | Status: DC | PRN
  Administered 2014-12-04: 5 mL

## 2014-12-04 MED ORDER — CEFAZOLIN SODIUM-DEXTROSE 2-3 GM-% IV SOLR: 2.0000 g | INTRAVENOUS | Status: DC

## 2014-12-04 MED ORDER — MEPERIDINE HCL 25 MG/ML IJ SOLN: 6.2500 mg | INTRAMUSCULAR | Status: DC | PRN

## 2014-12-04 SURGICAL SUPPLY — 34 items
BANDAGE COBAN STERILE 2 (GAUZE/BANDAGES/DRESSINGS) ×3 IMPLANT
BLADE SURG 15 STRL LF DISP TIS (BLADE) ×1 IMPLANT
BLADE SURG 15 STRL SS (BLADE) ×3
BNDG CMPR 9X4 STRL LF SNTH (GAUZE/BANDAGES/DRESSINGS) ×1
BNDG ESMARK 4X9 LF (GAUZE/BANDAGES/DRESSINGS) ×2 IMPLANT
CHLORAPREP W/TINT 26ML (MISCELLANEOUS) ×3 IMPLANT
CORDS BIPOLAR (ELECTRODE) IMPLANT
COVER BACK TABLE 60X90IN (DRAPES) ×3 IMPLANT
COVER MAYO STAND STRL (DRAPES) ×3 IMPLANT
CUFF TOURNIQUET SINGLE 18IN (TOURNIQUET CUFF) IMPLANT
DECANTER SPIKE VIAL GLASS SM (MISCELLANEOUS) IMPLANT
DRAPE EXTREMITY T 121X128X90 (DRAPE) ×3 IMPLANT
DRAPE SURG 17X23 STRL (DRAPES) ×3 IMPLANT
GAUZE SPONGE 4X4 12PLY STRL (GAUZE/BANDAGES/DRESSINGS) ×3 IMPLANT
GAUZE XEROFORM 1X8 LF (GAUZE/BANDAGES/DRESSINGS) ×3 IMPLANT
GLOVE BIOGEL PI IND STRL 7.0 (GLOVE) IMPLANT
GLOVE BIOGEL PI IND STRL 8.5 (GLOVE) ×1 IMPLANT
GLOVE BIOGEL PI INDICATOR 7.0 (GLOVE) ×4
GLOVE BIOGEL PI INDICATOR 8.5 (GLOVE) ×2
GLOVE ECLIPSE 6.5 STRL STRAW (GLOVE) ×2 IMPLANT
GLOVE SURG ORTHO 8.0 STRL STRW (GLOVE) ×3 IMPLANT
GOWN STRL REUS W/ TWL LRG LVL3 (GOWN DISPOSABLE) ×1 IMPLANT
GOWN STRL REUS W/TWL LRG LVL3 (GOWN DISPOSABLE) ×3
GOWN STRL REUS W/TWL XL LVL3 (GOWN DISPOSABLE) ×3 IMPLANT
NDL PRECISIONGLIDE 27X1.5 (NEEDLE) ×1 IMPLANT
NEEDLE PRECISIONGLIDE 27X1.5 (NEEDLE) ×3 IMPLANT
NS IRRIG 1000ML POUR BTL (IV SOLUTION) ×3 IMPLANT
PACK BASIN DAY SURGERY FS (CUSTOM PROCEDURE TRAY) ×3 IMPLANT
STOCKINETTE 4X48 STRL (DRAPES) ×3 IMPLANT
SUT ETHILON 4 0 PS 2 18 (SUTURE) ×3 IMPLANT
SYR BULB 3OZ (MISCELLANEOUS) ×3 IMPLANT
SYR CONTROL 10ML LL (SYRINGE) ×3 IMPLANT
TOWEL OR 17X24 6PK STRL BLUE (TOWEL DISPOSABLE) ×6 IMPLANT
UNDERPAD 30X30 (UNDERPADS AND DIAPERS) ×3 IMPLANT

## 2014-12-04 NOTE — Anesthesia Procedure Notes (Signed)
Procedure Name: MAC Date/Time: 12/04/2014 8:36 AM Performed by: Marrianne Mood Pre-anesthesia Checklist: Patient identified, Timeout performed, Emergency Drugs available, Suction available and Patient being monitored Patient Re-evaluated:Patient Re-evaluated prior to inductionOxygen Delivery Method: Simple face mask

## 2014-12-04 NOTE — H&P (Signed)
Alicia Medina is 81 years old and post trigger finger release, carpal tunnel right hand.  This is doing well.  She is complaining of her left side.  She shows triggering of her left middle finger. Nerve conductions are positive on her left side.  She has had recurrence of her STS left ring finger. This has been injected on 2 occasions. We would recommend proceeding to surgical release. She would like to proceed.  She is allergic to tetracycline and Lisinopril.    She is on potassium, metformin, Crestor, glimepiride, metoprolol, losartan, Synthroid, multivitamins, vitamins C, D, E, B-12, magnesium and I-caps.  Past surgery includes thyroidectomy.  Family history is negative.   Social history, she does not smoke, drinks socially.  Married, housewife, retired Network engineer.  Review of systems is positive for hearing loss, high blood pressure, heart attack, cataracts, otherwise negative Alicia Medina is an 81 y.o. female.   Chief Complaint: trigger left ring finger HPI: see above  Past Medical History  Diagnosis Date  . Hypothyroidism, postsurgical   . Hypertension   . Hyperlipidemia   . Wears hearing aid     both ears  . Arthritis   . CAD (coronary artery disease) CARDIOLOGIST-  DR Martinique    08-28-2011 Inferior STEMI with BMS to the RCA, complicated by cardiogenic shock, VF, VDRF , temporary CHB and abrupt reocclusion of the RCA stent treated with a second BMS to the RCA. Has residual LAD disease. EF is 50%  . Type 2 diabetes mellitus   . Hemorrhoids, internal, with bleeding   . S/p bare metal coronary artery stent     2013  . Diverticulosis of colon     Past Surgical History  Procedure Laterality Date  . Total thyroidectomy Bilateral 05-02-2007    multinodular goiter  . Carpal tunnel release Right 01/25/2013    Procedure: CARPAL TUNNEL RELEASE;  Surgeon: Wynonia Sours, MD;  Location: [place];  Service: Orthopedics;  Laterality: Right;  . Trigger finger release Right  01/25/2013    Procedure: RELEASE TRIGGER FINGER/A-1 PULLEY RIGHT INDEX FINGER;  Surgeon: Wynonia Sours, MD;  Location: [place];  Service: Orthopedics;  Laterality: Right;  . Carpal tunnel release Left 10/25/2013    Procedure: LEFT CARPAL TUNNEL RELEASE;  Surgeon: Wynonia Sours, MD;  Location: [place];  Service: Orthopedics;  Laterality: Left;  . Trigger finger release Left 10/25/2013    Procedure: RELEASE A-1 PULLEY LEFT MIDDLE FINGER;  Surgeon: Wynonia Sours, MD;  Location: [place];  Service: Orthopedics;  Laterality: Left;  . Cataract extraction w/ intraocular lens  implant, bilateral  2007  . Coronary angioplasty with stent placement  08-28-2011  dr Martinique    BMS to [facility] with abrupt reocclusion treated with second BMS/  LAD ostial 90%, proximal50-60%, first diagonal 80-90%  . Transthoracic echocardiogram  08-30-2011    mild LVH/ ef 63-89%/  grade I diastolic dysfunction  . Cardiovascular stress test  10-03-2011  dr Martinique    normal perfusion / no ischemia/ ef 77%  . Colonoscopy  04-27-2003  . Transanal hemorrhoidal dearterialization N/A 03/28/2014    Procedure: TRANSANAL HEMORRHOIDAL DEARTERIALIZATION;  Surgeon: Leighton Ruff, MD;  Location: [facility];  Service: General;  Laterality: N/A;  . Left heart catheterization with coronary angiogram N/A 08/28/2011    Procedure: LEFT HEART CATHETERIZATION WITH CORONARY ANGIOGRAM;  Surgeon: Peter M Martinique, MD;  Location: [facility] CATH LAB;  Service: Cardiovascular;  Laterality: N/A;  .  Coronary angiogram Left 08/28/2011    Procedure: CORONARY ANGIOGRAM;  Surgeon: Peter M Martinique, MD;  Location: [facility] CATH LAB;  Service: Cardiovascular;  Laterality: Left;  . Percutaneous coronary stent intervention (pci-s) Left 08/28/2011    Procedure: PERCUTANEOUS CORONARY STENT INTERVENTION (PCI-S);  Surgeon: Peter M Martinique, MD;  Location: [facility] CATH LAB;  Service: Cardiovascular;  Laterality: Left;    Family History   Problem Relation Age of Onset  . Stroke Mother   . Cancer Mother     breast  . Cancer Father     lung  . Stroke Father    Social History:  reports that she has never smoked. She has never used smokeless tobacco. She reports that she drinks about 0.6 oz of alcohol per week. She reports that she does not use illicit drugs.  Allergies:  Allergies  Allergen Reactions  . Lisinopril Cough  . Tetracyclines & Related Other (See Comments)    Severe stomach cramps    No prescriptions prior to admission    Results for orders placed or performed during the hospital encounter of 12/04/14 (from the past 48 hour(s))  Basic metabolic panel     Status: Abnormal   Collection Time: 12/03/14  1:38 PM  Result Value Ref Range   Sodium 136 135 - 145 mmol/L   Potassium 4.5 3.5 - 5.1 mmol/L   Chloride 100 (L) 101 - 111 mmol/L   CO2 29 22 - 32 mmol/L   Glucose, Bld 162 (H) 70 - 99 mg/dL   BUN 12 6 - 20 mg/dL   Creatinine, Ser 0.94 0.44 - 1.00 mg/dL   Calcium 9.0 8.9 - 10.3 mg/dL   GFR calc non Af Amer 56 (L) >60 mL/min   GFR calc Af Amer >60 >60 mL/min    Comment: (NOTE) The eGFR has been calculated using the CKD EPI equation. This calculation has not been validated in all clinical situations. eGFR's persistently <60 mL/min signify possible Chronic Kidney Disease.    Anion gap 7 5 - 15    No results found.   Pertinent items are noted in HPI.  Height _0  (1.626 m), weight 72.576 kg (160 lb).  General appearance: alert, cooperative and appears stated age Head: Normocephalic, without obvious abnormality Neck: no JVD Resp: clear to auscultation bilaterally Cardio: normal apical impulse GI: soft, non-tender; bowel sounds normal; no masses,  no organomegaly Extremities: trigger left ring finger Pulses: 2+ and symmetric Skin: Skin color, texture, turgor normal. No rashes or lesions Neurologic: Grossly normal Incision/Wound: na  Assessment/Plan DIAGNOSIS: STS left ring  finger.  Pre, peri and post op care are discussed along with risks and complications. Patient is aware there is no guarantee with surgery, possibility of infection, injury to arteries, nerves, and tendons, incomplete relief and dystrophy. She is scheduled for release A-1 pulley left ring finger as an outpatient under regional anesthesia.  Dillon Livermore R 12/04/2014, 5:55 AM

## 2014-12-04 NOTE — Brief Op Note (Signed)
12/04/2014  9:06 AM  PATIENT:  Alicia Medina  81 y.o. female  PRE-OPERATIVE DIAGNOSIS:  CHRONIC STENOSING TENOSYNOVITIS LEFT RING FINGER  POST-OPERATIVE DIAGNOSIS:  CHRONIC STENOSING TENOSYNOVITIS LEFT RING FINGER  PROCEDURE:  Procedure(s): RELEASE TRIGGER FINGER/A-1 PULLEY LEFT RING FINGER (Left)  SURGEON:  Surgeon(s) and Role:    * Daryll Brod, MD - Primary  PHYSICIAN ASSISTANT:   ASSISTANTS: none   ANESTHESIA:   local and regional  EBL:     BLOOD ADMINISTERED:none  DRAINS: none   LOCAL MEDICATIONS USED:  BUPIVICAINE   SPECIMEN:  No Specimen  DISPOSITION OF SPECIMEN:  N/A  COUNTS:  YES  TOURNIQUET:   Total Tourniquet Time Documented: Forearm (Left) - 14 minutes Total: Forearm (Left) - 14 minutes   DICTATION: .Other Dictation: Dictation Number [id number]  PLAN OF CARE: Discharge to home after PACU  PATIENT DISPOSITION:  PACU - hemodynamically stable.

## 2014-12-04 NOTE — Transfer of Care (Signed)
Immediate Anesthesia Transfer of Care Note  Patient: Alicia Medina  Procedure(s) Performed: Procedure(s): RELEASE TRIGGER FINGER/A-1 PULLEY LEFT RING FINGER (Left)  Patient Location: PACU  Anesthesia Type:MAC  Level of Consciousness: awake and patient cooperative  Airway & Oxygen Therapy: Patient Spontanous Breathing and Patient connected to face mask oxygen  Post-op Assessment: Report given to RN  Post vital signs: stable and unstable  Last Vitals:  Filed Vitals:   12/04/14 0729  BP: 121/77  Pulse: 87  Temp: 36.8 C  Resp: 20    Complications: No apparent anesthesia complications

## 2014-12-04 NOTE — Anesthesia Postprocedure Evaluation (Signed)
  Anesthesia Post-op Note  Patient: Alicia Medina  Procedure(s) Performed: Procedure(s): RELEASE TRIGGER FINGER/A-1 PULLEY LEFT RING FINGER (Left)  Patient Location: PACU  Anesthesia Type: General, Bier Block   Level of Consciousness: awake, alert  and oriented  Airway and Oxygen Therapy: Patient Spontanous Breathing  Post-op Pain: none   Post-op Assessment: Post-op Vital signs reviewed  Post-op Vital Signs: Reviewed  Last Vitals:  Filed Vitals:   12/04/14 0924  BP:   Pulse: 76  Temp:   Resp: 19    Complications: No apparent anesthesia complications

## 2014-12-04 NOTE — Anesthesia Preprocedure Evaluation (Signed)
Anesthesia Evaluation  Patient identified by MRN, date of birth, ID band Patient awake    Reviewed: Allergy & Precautions, NPO status , Patient's Chart, lab work & pertinent test results  Airway Mallampati: I  TM Distance: >3 FB Neck ROM: Full    Dental  (+) Teeth Intact, Dental Advisory Given   Pulmonary  breath sounds clear to auscultation        Cardiovascular hypertension, Pt. on medications + CAD and + Past MI Rhythm:Regular Rate:Normal     Neuro/Psych    GI/Hepatic   Endo/Other  diabetes, Well Controlled, Type 2  Renal/GU      Musculoskeletal   Abdominal   Peds  Hematology   Anesthesia Other Findings   Reproductive/Obstetrics                             Anesthesia Physical Anesthesia Plan  ASA: III  Anesthesia Plan: General and Bier Block   Post-op Pain Management:    Induction: Intravenous  Airway Management Planned: Simple Face Mask  Additional Equipment:   Intra-op Plan:   Post-operative Plan:   Informed Consent: I have reviewed the patients History and Physical, chart, labs and discussed the procedure including the risks, benefits and alternatives for the proposed anesthesia with the patient or authorized representative who has indicated his/her understanding and acceptance.   Dental advisory given  Plan Discussed with: CRNA, Anesthesiologist and Surgeon  Anesthesia Plan Comments:         Anesthesia Quick Evaluation

## 2014-12-04 NOTE — Discharge Instructions (Signed)
°  Post Anesthesia Home Care Instructions  Activity: Get plenty of rest for the remainder of the day. A responsible adult should stay with you for 24 hours following the procedure.  For the next 24 hours, DO NOT: -Drive a car -Paediatric nurse -Drink alcoholic beverages -Take any medication unless instructed by your physician -Make any legal decisions or sign important papers.  Meals: Start with liquid foods such as gelatin or soup. Progress to regular foods as tolerated. Avoid greasy, spicy, heavy foods. If nausea and/or vomiting occur, drink only clear liquids until the nausea and/or vomiting subsides. Call your physician if vomiting continues.  Special Instructions/Symptoms: Your throat may feel dry or sore from the anesthesia or the breathing tube placed in your throat during surgery. If this causes discomfort, gargle with warm salt water. The discomfort should disappear within 24 hours.  If you had a scopolamine patch placed behind your ear for the management of post- operative nausea and/or vomiting:  1. The medication in the patch is effective for 72 hours, after which it should be removed.  Wrap patch in a tissue and discard in the trash. Wash hands thoroughly with soap and water. 2. You may remove the patch earlier than 72 hours if you experience unpleasant side effects which may include dry mouth, dizziness or visual disturbances. 3. Avoid touching the patch. Wash your hands with soap and water after contact with the patch.         HAND SURGERY    HOME CARE INSTRUCTIONS    The following instructions have been prepared to help you care for yourself upon your return home today.  Wound Care:  Keep your hand elevated above the level of your heart. Do not allow it to dangle by your side. Keep the dressing dry and do not remove it unless your doctor advises you to do so. He will usually change it at the time of you post-op visit. Moving your fingers is advised to stimulate  circulation but will depend on the site of your surgery. Of course, if you have a splint applied your doctor will advise you about movement.  Activity:  Do not drive or operate machinery today. Rest today and then you may return to your normal activity and work as indicated by your physician.  Diet: Drink liquids today or eat a light diet. You may resume a regular diet tomorrow.  General expectations: Pain for two or three days. Fingers may become slightly swollen.   Unexpected Observations- Call your doctor if any of these occur: Severe pain not relieved by pain medication. Elevated temperature. Dressing soaked with blood. Inability to move fingers. White or bluish color to fingers.

## 2014-12-04 NOTE — Op Note (Signed)
Dictate [phone]

## 2014-12-05 ENCOUNTER — Encounter (HOSPITAL_BASED_OUTPATIENT_CLINIC_OR_DEPARTMENT_OTHER): Payer: Self-pay | Admitting: Orthopedic Surgery

## 2014-12-05 NOTE — Op Note (Signed)
NAMEKYNLEY, METZGER NO.:  [id number]  MEDICAL RECORD NO.:  [id number]  LOCATION:                                FACILITY:  [facility]  PHYSICIAN:  Daryll Brod, M.D.       DATE OF BIRTH:  1933/01/29  DATE OF PROCEDURE:  12/04/2014 DATE OF DISCHARGE:  12/04/2014                              OPERATIVE REPORT   PREOPERATIVE DIAGNOSIS:  Stenosing tenosynovitis, left ring finger.  POSTOPERATIVE DIAGNOSIS:  Stenosing tenosynovitis, left ring finger.  OPERATION:  Release of A1 pulley, left ring finger.  SURGEON:  Daryll Brod, M.D.  ANESTHESIA:  Forearm-based IV regional with local infiltration.  ANESTHESIOLOGIST:  Lorrene Reid, M.D.  HISTORY:  The patient is a 81 year old female with a history of triggering of left ring finger.  This has not responded to conservative treatment.  She has elected to undergo surgical decompression of the A1 pulley, flexor tendons.  Pre, peri, and postoperative course have been discussed along with risks and complications.  She is aware that there is no guarantee with the surgery; possibility of infection; recurrence of injury to arteries, nerves, tendons, incomplete relief of symptoms, dystrophy.  In the preoperative area, the patient is seen, the extremity marked by both the patient and surgeon, and antibiotic given.  DESCRIPTION OF PROCEDURE:  The patient was brought to the operating room, where a forearm-based IV regional anesthetic was carried out without difficulty.  She was prepped using ChloraPrep, supine position, left arm free.  A 3-minute dry time was allowed.  Time-out taken, confirming the patient and procedure.  An oblique incision was made over the A1 pulley.  The left ring finger carried down through subcutaneous tissue.  Neurovascular structures identified and protected with retractors.  The A1 pulley was identified.  This was released on its radial aspect.  A small incision was made centrally in A2.  Moderate thickening of  the pulley was noted.  Partial tenosynovectomy was performed proximally, allowing the separation of the 2 tendons to be certain that they were in adherent.  Wound was copiously irrigated with saline.  Passive motion revealed no further triggering.  A wound was then closed with interrupted 4-0 nylon sutures.  Local infiltration with 0.25% bupivacaine without epinephrine was given, approximately 6 mL was used.  Sterile compressive dressing with the fingers free was applied.  On deflation of the tourniquet, all fingers immediately pinked.  She was taken to the recovery room for observation in satisfactory condition.  She will be discharged home to return to the [place] in 1 week on Nor[place].    ______________________________ Daryll Brod, M.D.   ______________________________ Daryll Brod, M.D.    GK/MEDQ  D:  12/04/2014  T:  12/05/2014  Job:  [id number]

## 2014-12-06 ENCOUNTER — Other Ambulatory Visit: Payer: Self-pay | Admitting: Cardiology

## 2015-01-23 ENCOUNTER — Other Ambulatory Visit: Payer: Self-pay

## 2015-01-23 DIAGNOSIS — Z1231 Encounter for screening mammogram for malignant neoplasm of breast: Secondary | ICD-10-CM

## 2015-02-13 DIAGNOSIS — N812 Incomplete uterovaginal prolapse: Secondary | ICD-10-CM | POA: Diagnosis not present

## 2015-02-25 ENCOUNTER — Ambulatory Visit
Admission: RE | Admit: 2015-02-25 | Discharge: 2015-02-25 | Disposition: A | Discharge status: Home / Self Care | Payer: Medicare Other | Source: Ambulatory Visit

## 2015-02-25 DIAGNOSIS — Z1231 Encounter for screening mammogram for malignant neoplasm of breast: Secondary | ICD-10-CM | POA: Diagnosis not present

## 2015-03-13 DIAGNOSIS — M65342 Trigger finger, left ring finger: Secondary | ICD-10-CM | POA: Diagnosis not present

## 2015-03-13 DIAGNOSIS — M67823 Other specified disorders of tendon, right elbow: Secondary | ICD-10-CM | POA: Diagnosis not present

## 2015-04-04 DIAGNOSIS — I10 Essential (primary) hypertension: Secondary | ICD-10-CM | POA: Diagnosis not present

## 2015-04-04 DIAGNOSIS — I252 Old myocardial infarction: Secondary | ICD-10-CM | POA: Diagnosis not present

## 2015-04-04 DIAGNOSIS — E785 Hyperlipidemia, unspecified: Secondary | ICD-10-CM | POA: Diagnosis not present

## 2015-04-04 DIAGNOSIS — I2510 Atherosclerotic heart disease of native coronary artery without angina pectoris: Secondary | ICD-10-CM | POA: Diagnosis not present

## 2015-04-08 DIAGNOSIS — N390 Urinary tract infection, site not specified: Secondary | ICD-10-CM | POA: Diagnosis not present

## 2015-04-08 DIAGNOSIS — Z6829 Body mass index (BMI) 29.0-29.9, adult: Secondary | ICD-10-CM | POA: Diagnosis not present

## 2015-04-08 DIAGNOSIS — Z01419 Encounter for gynecological examination (general) (routine) without abnormal findings: Secondary | ICD-10-CM | POA: Diagnosis not present

## 2015-04-11 ENCOUNTER — Encounter: Payer: Self-pay | Admitting: Cardiology

## 2015-04-11 ENCOUNTER — Ambulatory Visit (INDEPENDENT_AMBULATORY_CARE_PROVIDER_SITE_OTHER): Payer: Medicare Other | Admitting: Cardiology

## 2015-04-11 VITALS — BP 120/62 | HR 66 | Ht 63.75 in | Wt 162.5 lb

## 2015-04-11 DIAGNOSIS — I10 Essential (primary) hypertension: Secondary | ICD-10-CM

## 2015-04-11 DIAGNOSIS — E785 Hyperlipidemia, unspecified: Secondary | ICD-10-CM | POA: Diagnosis not present

## 2015-04-11 DIAGNOSIS — E119 Type 2 diabetes mellitus without complications: Secondary | ICD-10-CM

## 2015-04-11 DIAGNOSIS — I2510 Atherosclerotic heart disease of native coronary artery without angina pectoris: Secondary | ICD-10-CM

## 2015-04-11 MED ORDER — ROSUVASTATIN CALCIUM 20 MG PO TABS
20.0000 mg | ORAL_TABLET | Freq: Every day | ORAL | Status: DC
Start: 2015-04-11 — End: 2016-04-06

## 2015-04-11 MED ORDER — POTASSIUM CHLORIDE CRYS ER 20 MEQ PO TBCR: 20.0000 meq | EXTENDED_RELEASE_TABLET | Freq: Every day | ORAL | Status: DC

## 2015-04-11 MED ORDER — METOPROLOL TARTRATE 50 MG PO TABS: 50.0000 mg | ORAL_TABLET | Freq: Two times a day (BID) | ORAL | Status: DC

## 2015-04-11 NOTE — Progress Notes (Signed)
Alicia Medina Date of Birth: [birth year] Medical Record #[id number]  History of Present Illness: Alicia Medina is seen today for follow up CAD. She has had an inferior MI treated with BMS to the RCA in February 2013. She required defibrillation multiple times and her hospitalization was complicated by cardiogenic shock, VF, VDRF and CHB requiring a temporary pacemaker. She had abrupt occlusion of the RCA stent that was treated with a 2nd BMS to the RCA.Followup nuclear stress testing in March 2013 showed normal perfusion throughout with ejection fraction of 76%. On followup today she denies any cardiac complaints of chest pain, SOB, or palpitations. She has had a couple of minor surgical procedures with trigger finger and carpel tunnel release. She does report blood sugars have been low the last 3 days between 55-60.  Current Outpatient Prescriptions on File Prior to Visit  Medication Sig Dispense Refill  . aspirin 81 MG tablet Take 81 mg by mouth daily.    Marland Kitchen glimepiride (AMARYL) 2 MG tablet Take 2 mg by mouth daily before breakfast.    . insulin glargine (LANTUS) 100 UNIT/ML injection Inject 15 Units into the skin at bedtime.    Marland Kitchen levothyroxine (SYNTHROID, LEVOTHROID) 150 MCG tablet Take 150 mcg by mouth daily before breakfast.    . loperamide (IMODIUM) 2 MG capsule Take by mouth as needed for diarrhea or loose stools.    Marland Kitchen losartan (COZAAR) 25 MG tablet Take 25 mg by mouth every morning.     . metformin (FORTAMET) 500 MG (OSM) 24 hr tablet Take 500 mg by mouth 3 (three) times daily.     . Multiple Vitamin (MULTIVITAMIN) tablet Take 1 tablet by mouth daily.    . vitamin C (ASCORBIC ACID) 500 MG tablet Take 500 mg by mouth daily.     No current facility-administered medications on file prior to visit.    Allergies  Allergen Reactions  . Lisinopril Cough  . Tetracyclines & Related Other (See Comments)    Severe stomach cramps    Past Medical History  Diagnosis Date  . Hypothyroidism,  postsurgical   . Hypertension   . Hyperlipidemia   . Wears hearing aid     both ears  . Arthritis   . CAD (coronary artery disease) CARDIOLOGIST-  DR Martinique    08-28-2011 Inferior STEMI with BMS to the RCA, complicated by cardiogenic shock, VF, VDRF , temporary CHB and abrupt reocclusion of the RCA stent treated with a second BMS to the RCA. Has residual LAD disease. EF is 50%  . Type 2 diabetes mellitus   . Hemorrhoids, internal, with bleeding   . S/p bare metal coronary artery stent     2013  . Diverticulosis of colon     Past Surgical History  Procedure Laterality Date  . Total thyroidectomy Bilateral 05-02-2007    multinodular goiter  . Carpal tunnel release Right 01/25/2013    Procedure: CARPAL TUNNEL RELEASE;  Surgeon: Wynonia Sours, MD;  Location: [place];  Service: Orthopedics;  Laterality: Right;  . Trigger finger release Right 01/25/2013    Procedure: RELEASE TRIGGER FINGER/A-1 PULLEY RIGHT INDEX FINGER;  Surgeon: Wynonia Sours, MD;  Location: [place];  Service: Orthopedics;  Laterality: Right;  . Carpal tunnel release Left 10/25/2013    Procedure: LEFT CARPAL TUNNEL RELEASE;  Surgeon: Wynonia Sours, MD;  Location: [place];  Service: Orthopedics;  Laterality: Left;  . Trigger finger release Left 10/25/2013    Procedure:  RELEASE A-1 PULLEY LEFT MIDDLE FINGER;  Surgeon: Wynonia Sours, MD;  Location: [place];  Service: Orthopedics;  Laterality: Left;  . Cataract extraction w/ intraocular lens  implant, bilateral  2007  . Coronary angioplasty with stent placement  08-28-2011  dr Martinique    BMS to [facility] with abrupt reocclusion treated with second BMS/  LAD ostial 90%, proximal50-60%, first diagonal 80-90%  . Transthoracic echocardiogram  08-30-2011    mild LVH/ ef 16-10%/  grade I diastolic dysfunction  . Cardiovascular stress test  10-03-2011  dr Martinique    normal perfusion / no ischemia/ ef 77%  . Colonoscopy   04-27-2003  . Transanal hemorrhoidal dearterialization N/A 03/28/2014    Procedure: TRANSANAL HEMORRHOIDAL DEARTERIALIZATION;  Surgeon: Leighton Ruff, MD;  Location: [facility];  Service: General;  Laterality: N/A;  . Left heart catheterization with coronary angiogram N/A 08/28/2011    Procedure: LEFT HEART CATHETERIZATION WITH CORONARY ANGIOGRAM;  Surgeon: Peter M Martinique, MD;  Location: [facility] CATH LAB;  Service: Cardiovascular;  Laterality: N/A;  . Coronary angiogram Left 08/28/2011    Procedure: CORONARY ANGIOGRAM;  Surgeon: Peter M Martinique, MD;  Location: [facility] CATH LAB;  Service: Cardiovascular;  Laterality: Left;  . Percutaneous coronary stent intervention (pci-s) Left 08/28/2011    Procedure: PERCUTANEOUS CORONARY STENT INTERVENTION (PCI-S);  Surgeon: Peter M Martinique, MD;  Location: [facility] CATH LAB;  Service: Cardiovascular;  Laterality: Left;  . Trigger finger release Left 12/04/2014    Procedure: RELEASE TRIGGER FINGER/A-1 PULLEY LEFT RING FINGER;  Surgeon: Daryll Brod, MD;  Location: [place];  Service: Orthopedics;  Laterality: Left;    History  Smoking status  . Never Smoker   Smokeless tobacco  . Never Used    History  Alcohol Use  . 0.6 oz/week  . 1 Glasses of wine per week    Comment: occ    Family History  Problem Relation Age of Onset  . Stroke Mother   . Cancer Mother     breast  . Cancer Father     lung  . Stroke Father     Review of Systems: The review of systems is per the HPI.  All other systems were reviewed and are negative.  Physical Exam: BP 120/62 mmHg  Pulse 66  Ht 5' 3.75" (1.619 m)  Wt 73.71 kg (162 lb 8 oz)  BMI 28.12 kg/m2 Patient is very pleasant and in no acute distress.  Skin is warm and dry. Color is normal.  HEENT is unremarkable. Normocephalic/atraumatic. PERRL. Sclera are nonicteric. Neck is supple. No masses. No JVD. Lungs are clear. Cardiac exam shows a regular rate and rhythm. Abdomen is soft. Extremities are without  edema. Gait and ROM are intact. No gross neurologic deficits noted.  LABORATORY DATA: Lab Results  Component Value Date   WBC 6.9 09/21/2011   HGB 11.1* 12/04/2014   HCT 36.3 09/21/2011   PLT 320.0 09/21/2011   GLUCOSE 162* 12/03/2014   CHOL 126 03/20/2014   TRIG 129.0 03/20/2014   HDL 44.40 03/20/2014   LDLCALC 56 03/20/2014   ALT 20 03/20/2014   AST 24 03/20/2014   NA 136 12/03/2014   K 4.5 12/03/2014   CL 100* 12/03/2014   CREATININE 0.94 12/03/2014   BUN 12 12/03/2014   CO2 29 12/03/2014   TSH 1.552 08/28/2011   INR 1.31 09/01/2011   HGBA1C 7.3* 08/28/2011     Assessment / Plan: 1. Status post acute inferior myocardial infarction in February  2013 with multiple complications. Clinically she has done very well. Followup nuclear stress test March 2013 was normal.  Continue ASA. Follow up in 6 months.   2. Hyperlipidemia, well controlled on Crestor.  3. Hypertension, controlled.  4. Diabetes mellitus type 2 now on insulin. Reports hypoglycemia. I have recommended she contact Dr. Rex Kras today for adjustment in medications.

## 2015-04-11 NOTE — Patient Instructions (Signed)
Continue your current therapy  You need to call Dr. Rex Kras today about your low blood sugars  I will see you in 6 months.

## 2015-04-15 DIAGNOSIS — E119 Type 2 diabetes mellitus without complications: Secondary | ICD-10-CM | POA: Diagnosis not present

## 2015-05-16 DIAGNOSIS — N812 Incomplete uterovaginal prolapse: Secondary | ICD-10-CM | POA: Diagnosis not present

## 2015-06-07 DIAGNOSIS — Z23 Encounter for immunization: Secondary | ICD-10-CM | POA: Diagnosis not present

## 2015-07-11 DIAGNOSIS — N812 Incomplete uterovaginal prolapse: Secondary | ICD-10-CM | POA: Diagnosis not present

## 2015-08-15 DIAGNOSIS — H52223 Regular astigmatism, bilateral: Secondary | ICD-10-CM | POA: Diagnosis not present

## 2015-08-15 DIAGNOSIS — E119 Type 2 diabetes mellitus without complications: Secondary | ICD-10-CM | POA: Diagnosis not present

## 2015-08-15 DIAGNOSIS — D3132 Benign neoplasm of left choroid: Secondary | ICD-10-CM | POA: Diagnosis not present

## 2015-08-15 DIAGNOSIS — H26492 Other secondary cataract, left eye: Secondary | ICD-10-CM | POA: Diagnosis not present

## 2015-08-15 DIAGNOSIS — H524 Presbyopia: Secondary | ICD-10-CM | POA: Diagnosis not present

## 2015-09-09 DIAGNOSIS — N812 Incomplete uterovaginal prolapse: Secondary | ICD-10-CM | POA: Diagnosis not present

## 2015-11-04 ENCOUNTER — Other Ambulatory Visit: Payer: Self-pay

## 2015-11-04 DIAGNOSIS — E785 Hyperlipidemia, unspecified: Secondary | ICD-10-CM

## 2015-11-04 DIAGNOSIS — N812 Incomplete uterovaginal prolapse: Secondary | ICD-10-CM | POA: Diagnosis not present

## 2015-11-04 DIAGNOSIS — I10 Essential (primary) hypertension: Secondary | ICD-10-CM

## 2015-11-04 DIAGNOSIS — I2510 Atherosclerotic heart disease of native coronary artery without angina pectoris: Secondary | ICD-10-CM

## 2015-11-04 DIAGNOSIS — Z794 Long term (current) use of insulin: Principal | ICD-10-CM

## 2015-11-04 DIAGNOSIS — E119 Type 2 diabetes mellitus without complications: Secondary | ICD-10-CM

## 2015-11-05 DIAGNOSIS — M9903 Segmental and somatic dysfunction of lumbar region: Secondary | ICD-10-CM | POA: Diagnosis not present

## 2015-11-05 DIAGNOSIS — M9902 Segmental and somatic dysfunction of thoracic region: Secondary | ICD-10-CM | POA: Diagnosis not present

## 2015-11-05 DIAGNOSIS — M9901 Segmental and somatic dysfunction of cervical region: Secondary | ICD-10-CM | POA: Diagnosis not present

## 2015-11-05 DIAGNOSIS — M4722 Other spondylosis with radiculopathy, cervical region: Secondary | ICD-10-CM | POA: Diagnosis not present

## 2015-11-06 DIAGNOSIS — M9903 Segmental and somatic dysfunction of lumbar region: Secondary | ICD-10-CM | POA: Diagnosis not present

## 2015-11-06 DIAGNOSIS — M9901 Segmental and somatic dysfunction of cervical region: Secondary | ICD-10-CM | POA: Diagnosis not present

## 2015-11-06 DIAGNOSIS — M4722 Other spondylosis with radiculopathy, cervical region: Secondary | ICD-10-CM | POA: Diagnosis not present

## 2015-11-06 DIAGNOSIS — M9902 Segmental and somatic dysfunction of thoracic region: Secondary | ICD-10-CM | POA: Diagnosis not present

## 2015-11-07 DIAGNOSIS — M4722 Other spondylosis with radiculopathy, cervical region: Secondary | ICD-10-CM | POA: Diagnosis not present

## 2015-11-07 DIAGNOSIS — M9903 Segmental and somatic dysfunction of lumbar region: Secondary | ICD-10-CM | POA: Diagnosis not present

## 2015-11-07 DIAGNOSIS — M9901 Segmental and somatic dysfunction of cervical region: Secondary | ICD-10-CM | POA: Diagnosis not present

## 2015-11-07 DIAGNOSIS — M9902 Segmental and somatic dysfunction of thoracic region: Secondary | ICD-10-CM | POA: Diagnosis not present

## 2015-11-11 DIAGNOSIS — M9903 Segmental and somatic dysfunction of lumbar region: Secondary | ICD-10-CM | POA: Diagnosis not present

## 2015-11-11 DIAGNOSIS — M4722 Other spondylosis with radiculopathy, cervical region: Secondary | ICD-10-CM | POA: Diagnosis not present

## 2015-11-11 DIAGNOSIS — M9902 Segmental and somatic dysfunction of thoracic region: Secondary | ICD-10-CM | POA: Diagnosis not present

## 2015-11-11 DIAGNOSIS — M9901 Segmental and somatic dysfunction of cervical region: Secondary | ICD-10-CM | POA: Diagnosis not present

## 2015-11-12 DIAGNOSIS — E785 Hyperlipidemia, unspecified: Secondary | ICD-10-CM | POA: Diagnosis not present

## 2015-11-12 DIAGNOSIS — E119 Type 2 diabetes mellitus without complications: Secondary | ICD-10-CM | POA: Diagnosis not present

## 2015-11-12 DIAGNOSIS — I2510 Atherosclerotic heart disease of native coronary artery without angina pectoris: Secondary | ICD-10-CM | POA: Diagnosis not present

## 2015-11-12 DIAGNOSIS — I10 Essential (primary) hypertension: Secondary | ICD-10-CM | POA: Diagnosis not present

## 2015-11-12 DIAGNOSIS — Z794 Long term (current) use of insulin: Secondary | ICD-10-CM | POA: Diagnosis not present

## 2015-11-13 ENCOUNTER — Telehealth: Payer: Self-pay | Admitting: Cardiology

## 2015-11-13 DIAGNOSIS — M9902 Segmental and somatic dysfunction of thoracic region: Secondary | ICD-10-CM | POA: Diagnosis not present

## 2015-11-13 DIAGNOSIS — M9903 Segmental and somatic dysfunction of lumbar region: Secondary | ICD-10-CM | POA: Diagnosis not present

## 2015-11-13 DIAGNOSIS — M9901 Segmental and somatic dysfunction of cervical region: Secondary | ICD-10-CM | POA: Diagnosis not present

## 2015-11-13 DIAGNOSIS — M4722 Other spondylosis with radiculopathy, cervical region: Secondary | ICD-10-CM | POA: Diagnosis not present

## 2015-11-13 LAB — HEPATIC FUNCTION PANEL
ALT: 15 U/L (ref 6–29)
AST: 18 U/L (ref 10–35)
Albumin: 4.4 g/dL (ref 3.6–5.1)
Alkaline Phosphatase: 43 U/L (ref 33–130)
Bilirubin, Direct: 0.1 mg/dL
Indirect Bilirubin: 0.3 mg/dL (ref 0.2–1.2)
Total Bilirubin: 0.4 mg/dL (ref 0.2–1.2)
Total Protein: 7.3 g/dL (ref 6.1–8.1)

## 2015-11-13 LAB — CBC WITH DIFFERENTIAL/PLATELET
BASOS PCT: 0 %
Basophils Absolute: 0 cells/uL (ref 0–200)
Eosinophils Absolute: 312 cells/uL (ref 15–500)
Eosinophils Relative: 4 %
HEMATOCRIT: 38 % (ref 35.0–45.0)
Hemoglobin: 12.3 g/dL (ref 11.7–15.5)
Lymphocytes Relative: 24 %
Lymphs Abs: 1872 cells/uL (ref 850–3900)
MCH: 27.9 pg (ref 27.0–33.0)
MCHC: 32.4 g/dL (ref 32.0–36.0)
MCV: 86.2 fL (ref 80.0–100.0)
MONO ABS: 936 {cells}/uL (ref 200–950)
MPV: 9.9 fL (ref 7.5–12.5)
Monocytes Relative: 12 %
NEUTROS ABS: 4680 {cells}/uL (ref 1500–7800)
Neutrophils Relative %: 60 %
PLATELETS: 328 10*3/uL (ref 140–400)
RBC: 4.41 MIL/uL (ref 3.80–5.10)
RDW: 14.4 % (ref 11.0–15.0)
WBC: 7.8 10*3/uL (ref 3.8–10.8)

## 2015-11-13 LAB — BASIC METABOLIC PANEL
BUN: 14 mg/dL (ref 7–25)
CO2: 29 mmol/L (ref 20–31)
Calcium: 9.6 mg/dL (ref 8.6–10.4)
Chloride: 104 mmol/L (ref 98–110)
Creat: 0.84 mg/dL (ref 0.60–0.88)
Glucose, Bld: 133 mg/dL — ABNORMAL HIGH (ref 65–99)
Potassium: 4.8 mmol/L (ref 3.5–5.3)
SODIUM: 140 mmol/L (ref 135–146)

## 2015-11-13 LAB — LIPID PANEL
Cholesterol: 129 mg/dL (ref 125–200)
HDL: 48 mg/dL (ref >=46)
LDL CALC: 58 mg/dL (ref <130)
Total CHOL/HDL Ratio: 2.7 Ratio (ref <=5.0)
Triglycerides: 116 mg/dL (ref <150)
VLDL: 23 mg/dL (ref <30)

## 2015-11-13 LAB — HEMOGLOBIN A1C
HEMOGLOBIN A1C: 7.6 % — AB (ref <5.7)
MEAN PLASMA GLUCOSE: 171 mg/dL

## 2015-11-13 NOTE — Telephone Encounter (Signed)
Returned call to patient lab results given.Advised to see PCP about elevated blood sugar.Copy faxed to Dr.James Little.

## 2015-11-13 NOTE — Telephone Encounter (Signed)
Pt is calling in stating that she received a letter from you about lab work. Please f/u with her

## 2015-11-18 DIAGNOSIS — M9902 Segmental and somatic dysfunction of thoracic region: Secondary | ICD-10-CM | POA: Diagnosis not present

## 2015-11-18 DIAGNOSIS — M9903 Segmental and somatic dysfunction of lumbar region: Secondary | ICD-10-CM | POA: Diagnosis not present

## 2015-11-18 DIAGNOSIS — M9901 Segmental and somatic dysfunction of cervical region: Secondary | ICD-10-CM | POA: Diagnosis not present

## 2015-11-18 DIAGNOSIS — M4722 Other spondylosis with radiculopathy, cervical region: Secondary | ICD-10-CM | POA: Diagnosis not present

## 2015-11-20 ENCOUNTER — Ambulatory Visit (INDEPENDENT_AMBULATORY_CARE_PROVIDER_SITE_OTHER): Payer: Medicare Other | Admitting: Cardiology

## 2015-11-20 ENCOUNTER — Encounter: Payer: Self-pay | Admitting: Cardiology

## 2015-11-20 VITALS — BP 113/64 | HR 76 | Ht 64.0 in | Wt 158.4 lb

## 2015-11-20 DIAGNOSIS — E785 Hyperlipidemia, unspecified: Secondary | ICD-10-CM

## 2015-11-20 DIAGNOSIS — I10 Essential (primary) hypertension: Secondary | ICD-10-CM | POA: Diagnosis not present

## 2015-11-20 DIAGNOSIS — I2510 Atherosclerotic heart disease of native coronary artery without angina pectoris: Secondary | ICD-10-CM | POA: Diagnosis not present

## 2015-11-20 DIAGNOSIS — E119 Type 2 diabetes mellitus without complications: Secondary | ICD-10-CM | POA: Diagnosis not present

## 2015-11-20 DIAGNOSIS — Z794 Long term (current) use of insulin: Secondary | ICD-10-CM

## 2015-11-20 NOTE — Progress Notes (Signed)
Alicia Medina Date of Birth: 1933-06-26 Medical Record [id number]  History of Present Illness: Alicia Medina is seen today for follow up CAD. She has had an inferior MI treated with BMS to the RCA in February 2013. She required defibrillation multiple times and her hospitalization was complicated by cardiogenic shock, VF, VDRF and CHB requiring a temporary pacemaker. She had abrupt occlusion of the RCA stent that was treated with a 2nd BMS to the RCA.Followup nuclear stress testing in March 2013 showed normal perfusion throughout with ejection fraction of 76%. On followup today she denies any cardiac complaints of chest pain, SOB, or palpitations. She swims 2-3 days per week. She was taken off glimeperide and hypoglycemic spells have resolved. She has lost 4 lbs since September.   Current Outpatient Prescriptions on File Prior to Visit  Medication Sig Dispense Refill  . aspirin 81 MG tablet Take 81 mg by mouth daily.    . insulin glargine (LANTUS) 100 UNIT/ML injection Inject 15 Units into the skin at bedtime.    Marland Kitchen levothyroxine (SYNTHROID, LEVOTHROID) 150 MCG tablet Take 150 mcg by mouth daily before breakfast.    . loperamide (IMODIUM) 2 MG capsule Take by mouth as needed for diarrhea or loose stools.    Marland Kitchen losartan (COZAAR) 25 MG tablet Take 25 mg by mouth every morning.     . metformin (FORTAMET) 500 MG (OSM) 24 hr tablet Take 500 mg by mouth 3 (three) times daily.     . metoprolol (LOPRESSOR) 50 MG tablet Take 1 tablet (50 mg total) by mouth 2 (two) times daily. 180 tablet 3  . Multiple Vitamin (MULTIVITAMIN) tablet Take 1 tablet by mouth daily.    . potassium chloride SA (K-DUR,KLOR-CON) 20 MEQ tablet Take 1 tablet (20 mEq total) by mouth daily. 90 tablet 3  . rosuvastatin (CRESTOR) 20 MG tablet Take 1 tablet (20 mg total) by mouth daily. 90 tablet 3  . vitamin C (ASCORBIC ACID) 500 MG tablet Take 500 mg by mouth daily.     No current facility-administered medications on file prior to  visit.    Allergies  Allergen Reactions  . Lisinopril Cough  . Tetracyclines & Related Other (See Comments)    Severe stomach cramps    Past Medical History  Diagnosis Date  . Hypothyroidism, postsurgical   . Hypertension   . Hyperlipidemia   . Wears hearing aid     both ears  . Arthritis   . CAD (coronary artery disease) CARDIOLOGIST-  DR Martinique    08-28-2011 Inferior STEMI with BMS to the RCA, complicated by cardiogenic shock, VF, VDRF , temporary CHB and abrupt reocclusion of the RCA stent treated with a second BMS to the RCA. Has residual LAD disease. EF is 50%  . Type 2 diabetes mellitus ([place])   . Hemorrhoids, internal, with bleeding   . S/p bare metal coronary artery stent     2013  . Diverticulosis of colon     Past Surgical History  Procedure Laterality Date  . Total thyroidectomy Bilateral 05-02-2007    multinodular goiter  . Carpal tunnel release Right 01/25/2013    Procedure: CARPAL TUNNEL RELEASE;  Surgeon: Wynonia Sours, MD;  Location: [place];  Service: Orthopedics;  Laterality: Right;  . Trigger finger release Right 01/25/2013    Procedure: RELEASE TRIGGER FINGER/A-1 PULLEY RIGHT INDEX FINGER;  Surgeon: Wynonia Sours, MD;  Location: [place];  Service: Orthopedics;  Laterality: Right;  . Carpal tunnel  release Left 10/25/2013    Procedure: LEFT CARPAL TUNNEL RELEASE;  Surgeon: Wynonia Sours, MD;  Location: [place];  Service: Orthopedics;  Laterality: Left;  . Trigger finger release Left 10/25/2013    Procedure: RELEASE A-1 PULLEY LEFT MIDDLE FINGER;  Surgeon: Wynonia Sours, MD;  Location: [place];  Service: Orthopedics;  Laterality: Left;  . Cataract extraction w/ intraocular lens  implant, bilateral  2007  . Coronary angioplasty with stent placement  08-28-2011  dr Martinique    BMS to [facility] with abrupt reocclusion treated with second BMS/  LAD ostial 90%, proximal50-60%, first diagonal 80-90%  .  Transthoracic echocardiogram  08-30-2011    mild LVH/ ef [id number]  grade I diastolic dysfunction  . Cardiovascular stress test  10-03-2011  dr Martinique    normal perfusion / no ischemia/ ef 77%  . Colonoscopy  04-27-2003  . Transanal hemorrhoidal dearterialization N/A 03/28/2014    Procedure: TRANSANAL HEMORRHOIDAL DEARTERIALIZATION;  Surgeon: Leighton Ruff, MD;  Location: [facility];  Service: General;  Laterality: N/A;  . Left heart catheterization with coronary angiogram N/A 08/28/2011    Procedure: LEFT HEART CATHETERIZATION WITH CORONARY ANGIOGRAM;  Surgeon: Peter M Martinique, MD;  Location: [facility] CATH LAB;  Service: Cardiovascular;  Laterality: N/A;  . Coronary angiogram Left 08/28/2011    Procedure: CORONARY ANGIOGRAM;  Surgeon: Peter M Martinique, MD;  Location: [facility] CATH LAB;  Service: Cardiovascular;  Laterality: Left;  . Percutaneous coronary stent intervention (pci-s) Left 08/28/2011    Procedure: PERCUTANEOUS CORONARY STENT INTERVENTION (PCI-S);  Surgeon: Peter M Martinique, MD;  Location: [facility] CATH LAB;  Service: Cardiovascular;  Laterality: Left;  . Trigger finger release Left 12/04/2014    Procedure: RELEASE TRIGGER FINGER/A-1 PULLEY LEFT RING FINGER;  Surgeon: Daryll Brod, MD;  Location: [place];  Service: Orthopedics;  Laterality: Left;    History  Smoking status  . Never Smoker   Smokeless tobacco  . Never Used    History  Alcohol Use  . 0.6 oz/week  . 1 Glasses of wine per week    Comment: occ    Family History  Problem Relation Age of Onset  . Stroke Mother   . Cancer Mother     breast  . Cancer Father     lung  . Stroke Father     Review of Systems: The review of systems is per the HPI.  All other systems were reviewed and are negative.  Physical Exam: BP 113/64 mmHg  Pulse 76  Ht 5\' 4"  (1.626 m)  Wt 71.85 kg (158 lb 6.4 oz)  BMI 27.18 kg/m2 Patient is very pleasant and in no acute distress.  Skin is warm and dry. Color is normal.  HEENT is  unremarkable. Normocephalic/atraumatic. PERRL. Sclera are nonicteric. Neck is supple. No masses. No JVD. Lungs are clear. Cardiac exam shows a regular rate and rhythm. Abdomen is soft. Extremities are without edema. Gait and ROM are intact. No gross neurologic deficits noted.  LABORATORY DATA: Lab Results  Component Value Date   WBC 7.8 11/12/2015   HGB 12.3 11/12/2015   HCT 38.0 11/12/2015   PLT 328 11/12/2015   GLUCOSE 133* 11/12/2015   CHOL 129 11/12/2015   TRIG 116 11/12/2015   HDL 48 11/12/2015   LDLCALC 58 11/12/2015   ALT 15 11/12/2015   AST 18 11/12/2015   NA 140 11/12/2015   K 4.8 11/12/2015   CL 104 11/12/2015   CREATININE 0.84 11/12/2015  BUN 14 11/12/2015   CO2 29 11/12/2015   TSH 1.552 08/28/2011   INR 1.31 09/01/2011   HGBA1C 7.6* 11/12/2015   Ecg today shows NSR with rate 77. Low voltage. Otherwise normal. I have personally reviewed and interpreted this study.   Assessment / Plan: 1. Status post acute inferior myocardial infarction in February 2013 with multiple complications. Clinically she has done very well. Followup nuclear stress test March 2013 was normal.  Continue ASA. Follow up in 6 months.   2. Hyperlipidemia, well controlled on Crestor.  3. Hypertension, well controlled.  4. Diabetes mellitus type 2 now on insulin. No hypoglycemia on insulin and metformin. A1c 7.6%. Continue current therapy.

## 2015-11-20 NOTE — Patient Instructions (Signed)
Continue your current therapy  I will see you in 6 months.   

## 2015-11-25 DIAGNOSIS — M9903 Segmental and somatic dysfunction of lumbar region: Secondary | ICD-10-CM | POA: Diagnosis not present

## 2015-11-25 DIAGNOSIS — M4722 Other spondylosis with radiculopathy, cervical region: Secondary | ICD-10-CM | POA: Diagnosis not present

## 2015-11-25 DIAGNOSIS — M9902 Segmental and somatic dysfunction of thoracic region: Secondary | ICD-10-CM | POA: Diagnosis not present

## 2015-11-25 DIAGNOSIS — M9901 Segmental and somatic dysfunction of cervical region: Secondary | ICD-10-CM | POA: Diagnosis not present

## 2015-12-02 DIAGNOSIS — M4722 Other spondylosis with radiculopathy, cervical region: Secondary | ICD-10-CM | POA: Diagnosis not present

## 2015-12-02 DIAGNOSIS — M9901 Segmental and somatic dysfunction of cervical region: Secondary | ICD-10-CM | POA: Diagnosis not present

## 2015-12-02 DIAGNOSIS — M9902 Segmental and somatic dysfunction of thoracic region: Secondary | ICD-10-CM | POA: Diagnosis not present

## 2015-12-02 DIAGNOSIS — M9903 Segmental and somatic dysfunction of lumbar region: Secondary | ICD-10-CM | POA: Diagnosis not present

## 2015-12-09 DIAGNOSIS — M9901 Segmental and somatic dysfunction of cervical region: Secondary | ICD-10-CM | POA: Diagnosis not present

## 2015-12-09 DIAGNOSIS — M9902 Segmental and somatic dysfunction of thoracic region: Secondary | ICD-10-CM | POA: Diagnosis not present

## 2015-12-09 DIAGNOSIS — M4722 Other spondylosis with radiculopathy, cervical region: Secondary | ICD-10-CM | POA: Diagnosis not present

## 2015-12-09 DIAGNOSIS — M9903 Segmental and somatic dysfunction of lumbar region: Secondary | ICD-10-CM | POA: Diagnosis not present

## 2015-12-16 DIAGNOSIS — M9902 Segmental and somatic dysfunction of thoracic region: Secondary | ICD-10-CM | POA: Diagnosis not present

## 2015-12-16 DIAGNOSIS — M4722 Other spondylosis with radiculopathy, cervical region: Secondary | ICD-10-CM | POA: Diagnosis not present

## 2015-12-16 DIAGNOSIS — M9901 Segmental and somatic dysfunction of cervical region: Secondary | ICD-10-CM | POA: Diagnosis not present

## 2015-12-16 DIAGNOSIS — M9903 Segmental and somatic dysfunction of lumbar region: Secondary | ICD-10-CM | POA: Diagnosis not present

## 2015-12-18 DIAGNOSIS — M4722 Other spondylosis with radiculopathy, cervical region: Secondary | ICD-10-CM | POA: Diagnosis not present

## 2015-12-18 DIAGNOSIS — M9901 Segmental and somatic dysfunction of cervical region: Secondary | ICD-10-CM | POA: Diagnosis not present

## 2015-12-18 DIAGNOSIS — M9903 Segmental and somatic dysfunction of lumbar region: Secondary | ICD-10-CM | POA: Diagnosis not present

## 2015-12-18 DIAGNOSIS — M9902 Segmental and somatic dysfunction of thoracic region: Secondary | ICD-10-CM | POA: Diagnosis not present

## 2015-12-24 DIAGNOSIS — M9902 Segmental and somatic dysfunction of thoracic region: Secondary | ICD-10-CM | POA: Diagnosis not present

## 2015-12-24 DIAGNOSIS — M4722 Other spondylosis with radiculopathy, cervical region: Secondary | ICD-10-CM | POA: Diagnosis not present

## 2015-12-24 DIAGNOSIS — M9903 Segmental and somatic dysfunction of lumbar region: Secondary | ICD-10-CM | POA: Diagnosis not present

## 2015-12-24 DIAGNOSIS — N812 Incomplete uterovaginal prolapse: Secondary | ICD-10-CM | POA: Diagnosis not present

## 2015-12-24 DIAGNOSIS — M9901 Segmental and somatic dysfunction of cervical region: Secondary | ICD-10-CM | POA: Diagnosis not present

## 2016-01-07 DIAGNOSIS — M9901 Segmental and somatic dysfunction of cervical region: Secondary | ICD-10-CM | POA: Diagnosis not present

## 2016-01-07 DIAGNOSIS — M9902 Segmental and somatic dysfunction of thoracic region: Secondary | ICD-10-CM | POA: Diagnosis not present

## 2016-01-07 DIAGNOSIS — M4722 Other spondylosis with radiculopathy, cervical region: Secondary | ICD-10-CM | POA: Diagnosis not present

## 2016-01-07 DIAGNOSIS — M9903 Segmental and somatic dysfunction of lumbar region: Secondary | ICD-10-CM | POA: Diagnosis not present

## 2016-01-14 DIAGNOSIS — N8189 Other female genital prolapse: Secondary | ICD-10-CM | POA: Diagnosis not present

## 2016-02-05 DIAGNOSIS — N8189 Other female genital prolapse: Secondary | ICD-10-CM | POA: Diagnosis not present

## 2016-02-05 DIAGNOSIS — N390 Urinary tract infection, site not specified: Secondary | ICD-10-CM | POA: Diagnosis not present

## 2016-02-05 DIAGNOSIS — R350 Frequency of micturition: Secondary | ICD-10-CM | POA: Diagnosis not present

## 2016-02-18 ENCOUNTER — Other Ambulatory Visit: Payer: Self-pay | Admitting: Family Medicine

## 2016-02-18 DIAGNOSIS — Z1231 Encounter for screening mammogram for malignant neoplasm of breast: Secondary | ICD-10-CM

## 2016-03-02 ENCOUNTER — Ambulatory Visit
Admission: RE | Admit: 2016-03-02 | Discharge: 2016-03-02 | Disposition: A | Discharge status: Home / Self Care | Payer: Medicare Other | Source: Ambulatory Visit | Attending: Family Medicine | Admitting: Family Medicine

## 2016-03-02 DIAGNOSIS — Z1231 Encounter for screening mammogram for malignant neoplasm of breast: Secondary | ICD-10-CM

## 2016-03-03 DIAGNOSIS — N8189 Other female genital prolapse: Secondary | ICD-10-CM | POA: Diagnosis not present

## 2016-04-06 ENCOUNTER — Other Ambulatory Visit: Payer: Self-pay | Admitting: Cardiology

## 2016-04-06 NOTE — Telephone Encounter (Signed)
REFILL 

## 2016-04-07 DIAGNOSIS — N8189 Other female genital prolapse: Secondary | ICD-10-CM | POA: Diagnosis not present

## 2016-04-29 DIAGNOSIS — Z01419 Encounter for gynecological examination (general) (routine) without abnormal findings: Secondary | ICD-10-CM | POA: Diagnosis not present

## 2016-04-29 DIAGNOSIS — Z6828 Body mass index (BMI) 28.0-28.9, adult: Secondary | ICD-10-CM | POA: Diagnosis not present

## 2016-04-29 DIAGNOSIS — Z124 Encounter for screening for malignant neoplasm of cervix: Secondary | ICD-10-CM | POA: Diagnosis not present

## 2016-05-28 ENCOUNTER — Encounter: Payer: Self-pay | Admitting: Cardiology

## 2016-06-04 ENCOUNTER — Telehealth: Payer: Self-pay | Admitting: Cardiology

## 2016-06-04 DIAGNOSIS — I2510 Atherosclerotic heart disease of native coronary artery without angina pectoris: Secondary | ICD-10-CM

## 2016-06-04 DIAGNOSIS — Z794 Long term (current) use of insulin: Secondary | ICD-10-CM | POA: Diagnosis not present

## 2016-06-04 DIAGNOSIS — E785 Hyperlipidemia, unspecified: Secondary | ICD-10-CM | POA: Diagnosis not present

## 2016-06-04 DIAGNOSIS — E119 Type 2 diabetes mellitus without complications: Secondary | ICD-10-CM

## 2016-06-04 LAB — LIPID PANEL
CHOL/HDL RATIO: 3.5 ratio (ref <5.0)
Cholesterol: 119 mg/dL (ref <200)
HDL: 34 mg/dL — ABNORMAL LOW (ref >50)
LDL CALC: 63 mg/dL
Triglycerides: 110 mg/dL (ref <150)
VLDL: 22 mg/dL (ref <30)

## 2016-06-04 LAB — HEPATIC FUNCTION PANEL
ALT: 37 U/L — ABNORMAL HIGH (ref 6–29)
AST: 22 U/L (ref 10–35)
Albumin: 3.9 g/dL (ref 3.6–5.1)
Alkaline Phosphatase: 42 U/L (ref 33–130)
BILIRUBIN DIRECT: 0.1 mg/dL (ref <=0.2)
BILIRUBIN INDIRECT: 0.2 mg/dL (ref 0.2–1.2)
TOTAL PROTEIN: 7 g/dL (ref 6.1–8.1)
Total Bilirubin: 0.3 mg/dL (ref 0.2–1.2)

## 2016-06-04 LAB — CBC WITH DIFFERENTIAL/PLATELET
BASOS PCT: 0 %
Basophils Absolute: 0 cells/uL (ref 0–200)
Eosinophils Absolute: 272 cells/uL (ref 15–500)
Eosinophils Relative: 4 %
HEMATOCRIT: 35.7 % (ref 35.0–45.0)
HEMOGLOBIN: 11.5 g/dL — AB (ref 11.7–15.5)
LYMPHS ABS: 1836 {cells}/uL (ref 850–3900)
Lymphocytes Relative: 27 %
MCH: 27.8 pg (ref 27.0–33.0)
MCHC: 32.2 g/dL (ref 32.0–36.0)
MCV: 86.4 fL (ref 80.0–100.0)
MONO ABS: 884 {cells}/uL (ref 200–950)
MPV: 9.8 fL (ref 7.5–12.5)
Monocytes Relative: 13 %
NEUTROS ABS: 3808 {cells}/uL (ref 1500–7800)
NEUTROS PCT: 56 %
Platelets: 386 10*3/uL (ref 140–400)
RBC: 4.13 MIL/uL (ref 3.80–5.10)
RDW: 13.8 % (ref 11.0–15.0)
WBC: 6.8 10*3/uL (ref 3.8–10.8)

## 2016-06-04 LAB — BASIC METABOLIC PANEL
BUN: 12 mg/dL (ref 7–25)
CHLORIDE: 105 mmol/L (ref 98–110)
CO2: 26 mmol/L (ref 20–31)
Calcium: 9.6 mg/dL (ref 8.6–10.4)
Creat: 0.8 mg/dL (ref 0.60–0.88)
GLUCOSE: 128 mg/dL — AB (ref 65–99)
POTASSIUM: 4.8 mmol/L (ref 3.5–5.3)
Sodium: 141 mmol/L (ref 135–146)

## 2016-06-04 LAB — HEMOGLOBIN A1C
Hgb A1c MFr Bld: 7.4 % — ABNORMAL HIGH (ref <5.7)
Mean Plasma Glucose: 166 mg/dL

## 2016-06-04 NOTE — Telephone Encounter (Signed)
She said she would like a lab order to get her lab work before her appt on 06-09-16 please. She would like to have it today if possible.

## 2016-06-04 NOTE — Telephone Encounter (Signed)
Returned call to patient.She stated she wanted to have fasting lab a1c,cbc,bmet,lipid,hepatic panels done this morning.Advised ok to have done at [facility] lab.

## 2016-06-05 DIAGNOSIS — Z23 Encounter for immunization: Secondary | ICD-10-CM | POA: Diagnosis not present

## 2016-06-10 NOTE — Progress Notes (Signed)
Alicia Medina Date of Birth: 03-19-1933 Medical Record [id number]  History of Present Illness: Alicia Medina is seen today for follow up CAD. She has had an inferior MI treated with BMS to the RCA in February 2013. She required defibrillation multiple times and her hospitalization was complicated by cardiogenic shock, VF, VDRF and CHB requiring a temporary pacemaker. She had abrupt occlusion of the RCA stent that was treated with a 2nd BMS to the RCA.Followup nuclear stress testing in March 2013 showed normal perfusion throughout with ejection fraction of 76%.  On followup today she denies any cardiac complaints of chest pain, SOB, or palpitations. She hasn't been exercising as much. Her husband has been diagnosed with lymphoma and she is stressed about this. He is currently having chemotherapy.   Current Outpatient Prescriptions on File Prior to Visit  Medication Sig Dispense Refill  . aspirin 81 MG tablet Take 81 mg by mouth daily.    . insulin glargine (LANTUS) 100 UNIT/ML injection Inject 15 Units into the skin at bedtime.    Marland Kitchen levothyroxine (SYNTHROID, LEVOTHROID) 150 MCG tablet Take 150 mcg by mouth daily before breakfast.    . loperamide (IMODIUM) 2 MG capsule Take by mouth as needed for diarrhea or loose stools.    Marland Kitchen losartan (COZAAR) 25 MG tablet Take 25 mg by mouth every morning.     . metformin (FORTAMET) 500 MG (OSM) 24 hr tablet Take 500 mg by mouth 3 (three) times daily.     . metoprolol (LOPRESSOR) 50 MG tablet Take 1 tablet (50 mg total) by mouth 2 (two) times daily. KEEP OV. 180 tablet 0  . Multiple Vitamin (MULTIVITAMIN) tablet Take 1 tablet by mouth daily.    . potassium chloride SA (K-DUR,KLOR-CON) 20 MEQ tablet Take 1 tablet (20 mEq total) by mouth daily. 90 tablet 3  . rosuvastatin (CRESTOR) 20 MG tablet Take 1 tablet (20 mg total) by mouth daily. KEEP OV. 90 tablet 0  . TRUETRACK TEST test strip   21  . vitamin C (ASCORBIC ACID) 500 MG tablet Take 500 mg by mouth daily.      No current facility-administered medications on file prior to visit.     Allergies  Allergen Reactions  . Lisinopril Cough  . Tetracyclines & Related Other (See Comments)    Severe stomach cramps    Past Medical History:  Diagnosis Date  . Arthritis   . CAD (coronary artery disease) CARDIOLOGIST-  DR Martinique   08-28-2011 Inferior STEMI with BMS to the RCA, complicated by cardiogenic shock, VF, VDRF , temporary CHB and abrupt reocclusion of the RCA stent treated with a second BMS to the RCA. Has residual LAD disease. EF is 50%  . Diverticulosis of colon   . Hemorrhoids, internal, with bleeding   . Hyperlipidemia   . Hypertension   . Hypothyroidism, postsurgical   . S/p bare metal coronary artery stent    2013  . Type 2 diabetes mellitus ([place])   . Wears hearing aid    both ears    Past Surgical History:  Procedure Laterality Date  . CARDIOVASCULAR STRESS TEST  10-03-2011  dr Martinique   normal perfusion / no ischemia/ ef 77%  . CARPAL TUNNEL RELEASE Right 01/25/2013   Procedure: CARPAL TUNNEL RELEASE;  Surgeon: Wynonia Sours, MD;  Location: [place];  Service: Orthopedics;  Laterality: Right;  . CARPAL TUNNEL RELEASE Left 10/25/2013   Procedure: LEFT CARPAL TUNNEL RELEASE;  Surgeon: Wynonia Sours, MD;  Location: [place];  Service: Orthopedics;  Laterality: Left;  . CATARACT EXTRACTION W/ INTRAOCULAR LENS  IMPLANT, BILATERAL  2007  . COLONOSCOPY  04-27-2003  . CORONARY ANGIOGRAM Left 08/28/2011   Procedure: CORONARY ANGIOGRAM;  Surgeon: Arlyn Buerkle M Martinique, MD;  Location[facility] C[facility] LAB;  Service: Cardiovascular;  Laterality: Left;  . CORONARY ANGIOPLASTY WITH STENT PLACEMENT  08-28-2011  dr Martinique   BMS to [facility] with abrupt reocclusion treated with second BMS/  LAD ostial 90%, proximal50-60%, first diagonal 80-90%  . LEFT HEART CATHETERIZATION WITH CORONARY ANGIOGRAM N/A 08/28/2011   Procedure: LEFT HEART CATHETERIZATION WITH CORONARY ANGIOGRAM;  Surgeon:  Halia Franey M Martinique, MD;  Location: [facility] CATH LAB;  Service: Cardiovascular;  Laterality: N/A;  . PERCUTANEOUS CORONARY STENT INTERVENTION (PCI-S) Left 08/28/2011   Procedure: PERCUTANEOUS CORONARY STENT INTERVENTION (PCI-S);  Surgeon: Jairus Tonne M Martinique, MD;  Location: [facility] CATH LAB;  Service: Cardiovascular;  Laterality: Left;  . TOTAL THYROIDECTOMY Bilateral 05-02-2007   multinodular goiter  . TRANSANAL HEMORRHOIDAL DEARTERIALIZATION N/A 03/28/2014   Procedure: TRANSANAL HEMORRHOIDAL DEARTERIALIZATION;  Surgeon: Leighton Ruff, MD;  Location: [facility];  Service: General;  Laterality: N/A;  . TRANSTHORACIC ECHOCARDIOGRAM  08-30-2011   mild LVH/ ef Q000111Q  grade I diastolic dysfunction  . TRIGGER FINGER RELEASE Right 01/25/2013   Procedure: RELEASE TRIGGER FINGER/A-1 PULLEY RIGHT INDEX FINGER;  Surgeon: Wynonia Sours, MD;  Location: [place];  Service: Orthopedics;  Laterality: Right;  . TRIGGER FINGER RELEASE Left 10/25/2013   Procedure: RELEASE A-1 PULLEY LEFT MIDDLE FINGER;  Surgeon: Wynonia Sours, MD;  Location: [place];  Service: Orthopedics;  Laterality: Left;  . TRIGGER FINGER RELEASE Left 12/04/2014   Procedure: RELEASE TRIGGER FINGER/A-1 PULLEY LEFT RING FINGER;  Surgeon: Daryll Brod, MD;  Location: [place];  Service: Orthopedics;  Laterality: Left;    History  Smoking Status  . Never Smoker  Smokeless Tobacco  . Never Used    History  Alcohol Use  . 0.6 oz/week  . 1 Glasses of wine per week    Comment: occ    Family History  Problem Relation Age of Onset  . Stroke Mother   . Cancer Mother     breast  . Cancer Father     lung  . Stroke Father     Review of Systems: The review of systems is per the HPI.  All other systems were reviewed and are negative.  Physical Exam: BP 112/62   Pulse 89   Ht 5\' 5"  (1.651 m)   Wt 155 lb 6.4 oz (70.5 kg)   BMI 25.86 kg/m  Patient is very pleasant and in no acute distress.  Skin  is warm and dry. Color is normal.  HEENT is unremarkable. Normocephalic/atraumatic. PERRL. Sclera are nonicteric. Neck is supple. No masses. No JVD. Lungs are clear. Cardiac exam shows a regular rate and rhythm. Abdomen is soft. Extremities are without edema. Gait and ROM are intact. No gross neurologic deficits noted.  LABORATORY DATA: Lab Results  Component Value Date   WBC 6.8 06/04/2016   HGB 11.5 (L) 06/04/2016   HCT 35.7 06/04/2016   PLT 386 06/04/2016   GLUCOSE 128 (H) 06/04/2016   CHOL 119 06/04/2016   TRIG 110 06/04/2016   HDL 34 (L) 06/04/2016   LDLCALC 63 06/04/2016   ALT 37 (H) 06/04/2016   AST 22 06/04/2016   NA 141 06/04/2016   K 4.8 06/04/2016   CL 105 06/04/2016  CREATININE 0.80 06/04/2016   BUN 12 06/04/2016   CO2 26 06/04/2016   TSH 1.552 08/28/2011   INR 1.31 09/01/2011   HGBA1C 7.4 (H) 06/04/2016    Assessment / Plan: 1. Status post acute inferior myocardial infarction in February 2013 with multiple complications. Clinically she has done very well. Followup nuclear stress test March 2013 was normal.  Continue ASA. Follow up in 6 months.   2. Hyperlipidemia, well controlled on Crestor.  3. Hypertension, well controlled.  4. Diabetes mellitus type 2  A1c 7.3%. Per primary care

## 2016-06-12 ENCOUNTER — Ambulatory Visit (INDEPENDENT_AMBULATORY_CARE_PROVIDER_SITE_OTHER): Payer: Medicare Other | Admitting: Cardiology

## 2016-06-12 ENCOUNTER — Encounter: Payer: Self-pay | Admitting: Cardiology

## 2016-06-12 VITALS — BP 112/62 | HR 89 | Ht 65.0 in | Wt 155.4 lb

## 2016-06-12 DIAGNOSIS — I2510 Atherosclerotic heart disease of native coronary artery without angina pectoris: Secondary | ICD-10-CM

## 2016-06-12 DIAGNOSIS — I10 Essential (primary) hypertension: Secondary | ICD-10-CM | POA: Diagnosis not present

## 2016-06-12 DIAGNOSIS — I252 Old myocardial infarction: Secondary | ICD-10-CM | POA: Diagnosis not present

## 2016-06-12 NOTE — Patient Instructions (Addendum)
Continue your current therapy  I will see you in 6 months.   

## 2016-06-15 DIAGNOSIS — M65351 Trigger finger, right little finger: Secondary | ICD-10-CM | POA: Diagnosis not present

## 2016-06-15 DIAGNOSIS — M6530 Trigger finger, unspecified finger: Secondary | ICD-10-CM | POA: Insufficient documentation

## 2016-06-15 DIAGNOSIS — M65341 Trigger finger, right ring finger: Secondary | ICD-10-CM | POA: Diagnosis not present

## 2016-06-23 DIAGNOSIS — N8189 Other female genital prolapse: Secondary | ICD-10-CM | POA: Diagnosis not present

## 2016-06-27 ENCOUNTER — Other Ambulatory Visit: Payer: Self-pay | Admitting: Cardiology

## 2016-06-29 NOTE — Telephone Encounter (Signed)
Rx(s) sent to pharmacy electronically.  

## 2016-07-13 DIAGNOSIS — M65341 Trigger finger, right ring finger: Secondary | ICD-10-CM | POA: Diagnosis not present

## 2016-07-13 DIAGNOSIS — M65351 Trigger finger, right little finger: Secondary | ICD-10-CM | POA: Diagnosis not present

## 2016-07-22 ENCOUNTER — Ambulatory Visit (INDEPENDENT_AMBULATORY_CARE_PROVIDER_SITE_OTHER): Payer: Medicare Other | Admitting: Family Medicine

## 2016-07-22 VITALS — BP 114/66 | HR 126 | Ht 65.0 in | Wt 156.5 lb

## 2016-07-22 DIAGNOSIS — E785 Hyperlipidemia, unspecified: Secondary | ICD-10-CM

## 2016-07-22 DIAGNOSIS — E890 Postprocedural hypothyroidism: Secondary | ICD-10-CM | POA: Insufficient documentation

## 2016-07-22 DIAGNOSIS — I252 Old myocardial infarction: Secondary | ICD-10-CM

## 2016-07-22 DIAGNOSIS — Z0000 Encounter for general adult medical examination without abnormal findings: Secondary | ICD-10-CM | POA: Diagnosis not present

## 2016-07-22 DIAGNOSIS — I10 Essential (primary) hypertension: Secondary | ICD-10-CM | POA: Diagnosis not present

## 2016-07-22 DIAGNOSIS — K648 Other hemorrhoids: Secondary | ICD-10-CM

## 2016-07-22 DIAGNOSIS — E1169 Type 2 diabetes mellitus with other specified complication: Secondary | ICD-10-CM

## 2016-07-22 DIAGNOSIS — Z794 Long term (current) use of insulin: Secondary | ICD-10-CM

## 2016-07-22 MED ORDER — METFORMIN HCL ER 500 MG PO TB24: 1000.0000 mg | ORAL_TABLET | Freq: Every day | ORAL | 1 refills | Status: DC

## 2016-07-22 NOTE — Progress Notes (Signed)
New patient office visit note:  Impression and Recommendations:    1. Encounter for wellness examination   2. Old inferior wall myocardial infarction   3. Type 2 diabetes mellitus with other specified complication, with long-term current use of insulin ([place])   4. Essential hypertension   5. Dyslipidemia   6. Hypothyroidism, postsurgical   7. Hemorrhoids, internal, with bleeding     Old inferior wall myocardial infarction Dr Neita Garnet- Cards- saw pt 11/17 of 2017:  "[name] is seen today for follow up CAD. She has had an inferior MI treated with BareMetalStent  to the RCA in February 2013.   She required defibrillation multiple times and her hospitalization was complicated by cardiogenic shock, VF, VDRF and CHB requiring a temporary pacemaker.   She had abrupt occlusion of the RCA stent that was treated with a 2nd BMS to the RCA.    Followup nuclear stress testing in March 2013 showed normal perfusion throughout with ejection fraction of 76%.  - Cardiovascular care per Dr Neita Garnet  DM2 (diabetes mellitus, type 2) - dx about 15-20 yrs ago.  Takes metformin XR 534m tab and insulin 15 U q hs. - well controlled.    Lab Results  Component Value Date   HGBA1C 7.4 (H) 06/04/2016   HGBA1C 7.6 (H) 11/12/2015   HGBA1C 7.3 (H) 08/28/2011   -We will continue to monitor;  prudent diet and increasing activity level discussed with patient.  - Reminded of need for yearly eye exam.   Hypothyroidism, postsurgical Had thyroid removed in 10/ 2008- patient not sure why.   Been on synthroid ever since; no symptoms.  We'll obtain labs near future.  Hemorrhoids, internal, with bleeding Last issue with them was yrs ago.   HTN (hypertension) Well controlled.  Meds per cardiology.  Dyslipidemia Patient on a statin as directed by cardiology.  Denies side effects.   No orders of the defined types were placed in this encounter.   New Prescriptions   METFORMIN (GLUCOPHAGE  XR) 500 MG 24 HR TABLET    Take 2 tablets (1,000 mg total) by mouth daily with breakfast.    Modified Medications   No medications on file    Discontinued Medications   FLUZONE HIGH-DOSE 0.5 ML SUSY    ADM 0.5ML IM UTD   METFORMIN (FORTAMET) 500 MG (OSM) 24 HR TABLET    Take 500 mg by mouth 3 (three) times daily.     The patient was counseled, risk factors were discussed, anticipatory guidance given.  Gross side effects, risk and benefits, and alternatives of medications discussed with patient.  Patient is aware that all medications have potential side effects and we are unable to predict every side effect or drug-drug interaction that may occur.  Expresses verbal understanding and consents to current therapy plan and treatment regimen.  Return for Follow-up of current medical issues around 2/9 for repeat A1c and OV .  Please see AVS handed out to patient at the end of our visit for further patient instructions/ counseling done pertaining to today's office visit.    Note: This document was prepared using Dragon voice recognition software and may include unintentional dictation errors.  ----------------------------------------------------------------------------------------------------------------------    Subjective:    Chief Complaint  Patient presents with  . Establish Care    HPI: Alicia GIRDNERis a pleasant 82y.o. female with a history significant for inferior MI [facility]  [facility] medical history with me and establish care.   I asked the patient to review their chronic problem list with me to ensure everything was updated and accurate.    Patient is a retired housewife.  She is married to Alicia Medina.  They had 2 boys ages 23 and 53.  She lives alone at home with her husband.    She has never smoked, rarely drinks, no history drug use.  Has not been sexually active many years.  Last A1c was 7.4 on 11\10\17.        Wt Readings from Last 3 Encounters:  07/22/16 156 lb 8 oz (71 kg)  06/12/16 155 lb 6.4 oz (70.5 kg)  11/20/15 158 lb 6.4 oz (71.8 kg)   BP Readings from Last 3 Encounters:  07/22/16 114/66  06/12/16 112/62  11/20/15 113/64   Pulse Readings from Last 3 Encounters:  07/22/16 (!) 126  06/12/16 89  11/20/15 76   BMI Readings from Last 3 Encounters:  07/22/16 26.04 kg/m  06/12/16 25.86 kg/m  11/20/15 27.19 kg/m    Patient Care Team    Relationship Specialty Notifications Start End  Mellody Dance, DO PCP - General Family Medicine  06/12/16   Peter M Martinique, MD Consulting Physician Cardiology  07/22/16   Maisie Fus, MD Consulting Physician Obstetrics and Gynecology  78/67/67   Leighton Ruff, MD Consulting Physician General Surgery  07/22/16   Armandina Gemma, MD Consulting Physician General Surgery  07/22/16   Daryll Brod, MD Consulting Physician Orthopedic Surgery  07/22/16     Patient Active Problem List   Diagnosis Date Noted  . HTN (hypertension) 09/27/2014    Priority: High  . Dyslipidemia 11/12/2011    Priority: High  . Old inferior wall myocardial infarction 08/29/2011    Priority: High  . DM2 (diabetes mellitus, type 2) 08/29/2011    Priority: High  . Hemorrhoids, internal, with bleeding 05/08/2013    Priority: Medium  . Hypothyroidism, postsurgical 07/22/2016  . Coronary artery disease 11/12/2011     Past Medical History:  Diagnosis Date  . Arthritis   . CAD (coronary artery disease) CARDIOLOGIST-  DR Martinique   08-28-2011 Inferior STEMI with BMS to the RCA, complicated by cardiogenic shock, VF, VDRF , temporary CHB and abrupt reocclusion of the RCA stent treated with a second BMS to the RCA. Has residual LAD disease. EF is 50%  . Diverticulosis of colon   . Hemorrhoids, internal, with bleeding   . Hyperlipidemia   . Hypertension   . Hypothyroidism, postsurgical   . S/p bare metal coronary artery stent    2013  . Type 2 diabetes mellitus ([place])    . Wears hearing aid    both ears     Past Medical History:  Diagnosis Date  . Arthritis   . CAD (coronary artery disease) CARDIOLOGIST-  DR Martinique   08-28-2011 Inferior STEMI with BMS to the RCA, complicated by cardiogenic shock, VF, VDRF , temporary CHB and abrupt reocclusion of the RCA stent treated with a second BMS to the RCA. Has residual LAD disease. EF is 50%  . Diverticulosis of colon   . Hemorrhoids, internal, with bleeding   . Hyperlipidemia   . Hypertension   . Hypothyroidism, postsurgical   . S/p bare metal coronary artery stent    2013  . Type 2 diabetes mellitus ([place])   . Wears hearing aid    both ears     Past Surgical History:  Procedure Laterality Date  .  CARDIOVASCULAR STRESS TEST  10-03-2011  dr Martinique   normal perfusion / no ischemia/ ef 77%  . CARPAL TUNNEL RELEASE Right 01/25/2013   Procedure: CARPAL TUNNEL RELEASE;  Surgeon: Wynonia Sours, MD;  Location: [place];  Service: Orthopedics;  Laterality: Right;  . CARPAL TUNNEL RELEASE Left 10/25/2013   Procedure: LEFT CARPAL TUNNEL RELEASE;  Surgeon: Wynonia Sours, MD;  Location: [place];  Service: Orthopedics;  Laterality: Left;  . CATARACT EXTRACTION W/ INTRAOCULAR LENS  IMPLANT, BILATERAL  2007  . COLONOSCOPY  04-27-2003  . CORONARY ANGIOGRAM Left 08/28/2011   Procedure: CORONARY ANGIOGRAM;  Surgeon: Peter M Martinique, MD;  Location: [facility] CATH LAB;  Service: Cardiovascular;  Laterality: Left;  . CORONARY ANGIOPLASTY WITH STENT PLACEMENT  08-28-2011  dr Martinique   BMS to [facility] with abrupt reocclusion treated with second BMS/  LAD ostial 90%, proximal50-60%, first diagonal 80-90%  . LEFT HEART CATHETERIZATION WITH CORONARY ANGIOGRAM N/A 08/28/2011   Procedure: LEFT HEART CATHETERIZATION WITH CORONARY ANGIOGRAM;  Surgeon: Peter M Martinique, MD;  Location: [facility] CATH LAB;  Service: Cardiovascular;  Laterality: N/A;  . PERCUTANEOUS CORONARY STENT INTERVENTION (PCI-S) Left 08/28/2011   Procedure:  PERCUTANEOUS CORONARY STENT INTERVENTION (PCI-S);  Surgeon: Peter M Martinique, MD;  Location[facility] C[facility] LAB;  Service: Cardiovascular;  Laterality: Left;  . TOTAL THYROIDECTOMY Bilateral 05-02-2007   multinodular goiter  . TRANSANAL HEMORRHOIDAL DEARTERIALIZATION N/A 03/28/2014   Procedure: TRANSANAL HEMORRHOIDAL DEARTERIALIZATION;  Surgeon: Leighton Ruff, MD;  Location: [facility];  Service: General;  Laterality: N/A;  . TRANSTHORACIC ECHOCARDIOGRAM  08-30-2011   mild LVH/ ef 32-35%/  grade I diastolic dysfunction  . TRIGGER FINGER RELEASE Right 01/25/2013   Procedure: RELEASE TRIGGER FINGER/A-1 PULLEY RIGHT INDEX FINGER;  Surgeon: Wynonia Sours, MD;  Location: [place];  Service: Orthopedics;  Laterality: Right;  . TRIGGER FINGER RELEASE Left 10/25/2013   Procedure: RELEASE A-1 PULLEY LEFT MIDDLE FINGER;  Surgeon: Wynonia Sours, MD;  Location: [place];  Service: Orthopedics;  Laterality: Left;  . TRIGGER FINGER RELEASE Left 12/04/2014   Procedure: RELEASE TRIGGER FINGER/A-1 PULLEY LEFT RING FINGER;  Surgeon: Daryll Brod, MD;  Location: [place];  Service: Orthopedics;  Laterality: Left;     Family History  Problem Relation Age of Onset  . Stroke Mother   . Cancer Mother     breast  . Cancer Father     lung  . Stroke Father      History  Drug Use No    History  Alcohol Use  . 0.6 oz/week  . 1 Glasses of wine per week    Comment: occ    History  Smoking Status  . Never Smoker  Smokeless Tobacco  . Never Used     Patient's Medications  New Prescriptions   METFORMIN (GLUCOPHAGE XR) 500 MG 24 HR TABLET    Take 2 tablets (1,000 mg total) by mouth daily with breakfast.  Previous Medications   ASPIRIN 81 MG TABLET    Take 81 mg by mouth daily.   CALCIUM CARBONATE (CALCIUM 600) 1500 (600 CA) MG TABS TABLET    Take 600 mg of elemental calcium by mouth daily with breakfast.   COENZYME Q10 200 MG TABS    Take 1 tablet by  mouth daily.   CYANOCOBALAMIN (B-12) 2500 MCG TABS    Take 1 tablet by mouth daily.   GELATIN 650 MG CAPSULE    Take 1,300  mg by mouth daily.   INSULIN GLARGINE (LANTUS) 100 UNIT/ML INJECTION    Inject 15 Units into the skin at bedtime.   LEVOTHYROXINE (SYNTHROID, LEVOTHROID) 150 MCG TABLET    Take 150 mcg by mouth daily before breakfast.   LOPERAMIDE (IMODIUM) 2 MG CAPSULE    Take by mouth as needed for diarrhea or loose stools.   LOSARTAN (COZAAR) 25 MG TABLET    Take 25 mg by mouth every morning.    METOPROLOL (LOPRESSOR) 50 MG TABLET    Take 1 tablet (50 mg total) by mouth 2 (two) times daily.   MULTIPLE VITAMIN (MULTIVITAMIN) TABLET    Take 1 tablet by mouth daily.   POTASSIUM CHLORIDE SA (K-DUR,KLOR-CON) 20 MEQ TABLET    TAKE 1 TABLET BY MOUTH DAILY.   ROSUVASTATIN (CRESTOR) 20 MG TABLET    Take 1 tablet (20 mg total) by mouth daily.   S-ADENOSYLMETHIONINE (SAM-E) 400 MG TABS    Take 1 tablet by mouth daily.   TRUETRACK TEST TEST STRIP       VITAMIN C (ASCORBIC ACID) 500 MG TABLET    Take 500 mg by mouth daily.   VITAMIN E PO    Take 500 mg by mouth daily.  Modified Medications   No medications on file  Discontinued Medications   FLUZONE HIGH-DOSE 0.5 ML SUSY    ADM 0.5ML IM UTD   METFORMIN (FORTAMET) 500 MG (OSM) 24 HR TABLET    Take 500 mg by mouth 3 (three) times daily.     Allergies: Demeclocycline; Lisinopril; and Tetracyclines & related  Review of Systems  Constitutional: Negative for diaphoresis, fever and weight loss.  HENT: Negative for tinnitus.   Respiratory: Negative for cough and wheezing.   Cardiovascular: Negative for chest pain and palpitations.  Gastrointestinal: Positive for blood in stool. Negative for diarrhea, nausea and vomiting.       Chronic- no acute---> no current sx  Musculoskeletal: Negative for joint pain and myalgias.  Skin: Negative for rash.  Neurological: Negative for weakness and headaches.  Endo/Heme/Allergies: Negative for environmental  allergies. Does not bruise/bleed easily.  Psychiatric/Behavioral: Negative for depression and memory loss. The patient is not nervous/anxious and does not have insomnia.      Objective:    Blood pressure 114/66, pulse (!) 126, height 5' 5"  (1.651 m), weight 156 lb 8 oz (71 kg). Body mass index is 26.04 kg/m. General: Well Developed, well nourished, and in no acute distress.  Neuro: Alert and oriented x3, extra-ocular muscles intact, sensation grossly intact.  HEENT: Normocephalic, atraumatic, pupils equal round reactive to light, neck supple, no JVD, no bruit Skin: no gross rashes  Cardiac: Regular rate and rhythm, + Murmur Respiratory: Essentially clear to auscultation bilaterally. Not using accessory muscles, speaking in full sentences.  Abdominal: not grossly distended Musculoskeletal: Ambulates w/o diff, FROM * 4 ext.  Vasc: less 2 sec cap RF, warm and pink  Psych:  No HI/SI, judgement and insight good, Euthymic mood. Full Affect.   Recent Results (from the past 2160 hour(s))  CBC w/Diff/Platelet     Status: Abnormal   Collection Time: 06/04/16 12:13 PM  Result Value Ref Range   WBC 6.8 3.8 - 10.8 K/uL   RBC 4.13 3.80 - 5.10 MIL/uL   Hemoglobin 11.5 (L) 11.7 - 15.5 g/dL   HCT 35.7 35.0 - 45.0 %   MCV 86.4 80.0 - 100.0 fL   MCH 27.8 27.0 - 33.0 pg   MCHC 32.2 32.0 - 36.0 g/dL  RDW 13.8 11.0 - 15.0 %   Platelets 386 140 - 400 K/uL   MPV 9.8 7.5 - 12.5 fL   Neutro Abs 3,808 1,500 - 7,800 cells/uL   Lymphs Abs 1,836 850 - 3,900 cells/uL   Monocytes Absolute 884 200 - 950 cells/uL   Eosinophils Absolute 272 15 - 500 cells/uL   Basophils Absolute 0 0 - 200 cells/uL   Neutrophils Relative % 56 %   Lymphocytes Relative 27 %   Monocytes Relative 13 %   Eosinophils Relative 4 %   Basophils Relative 0 %   Smear Review Criteria for review not met   Basic metabolic panel     Status: Abnormal   Collection Time: 06/04/16 12:13 PM  Result Value Ref Range   Sodium 141 135 - 146  mmol/L   Potassium 4.8 3.5 - 5.3 mmol/L   Chloride 105 98 - 110 mmol/L   CO2 26 20 - 31 mmol/L   Glucose, Bld 128 (H) 65 - 99 mg/dL   BUN 12 7 - 25 mg/dL   Creat 0.80 0.60 - 0.88 mg/dL    Comment:   For patients > or = 80 years of age: The upper reference limit for Creatinine is approximately 13% higher for people identified as African-American.      Calcium 9.6 8.6 - 10.4 mg/dL  Lipid panel     Status: Abnormal   Collection Time: 06/04/16 12:13 PM  Result Value Ref Range   Cholesterol 119 <200 mg/dL    Comment: ** Please note change in reference range(s). **      Triglycerides 110 <150 mg/dL    Comment: ** Please note change in reference range(s). **      HDL 34 (L) >50 mg/dL    Comment: ** Please note change in reference range(s). **      Total CHOL/HDL Ratio 3.5 <5.0 Ratio   VLDL 22 <30 mg/dL   LDL Cholesterol 63 mg/dL    Comment: ** Please note change in reference range(s). **     Hepatic function panel     Status: Abnormal   Collection Time: 06/04/16 12:13 PM  Result Value Ref Range   Total Bilirubin 0.3 0.2 - 1.2 mg/dL   Bilirubin, Direct 0.1 <=0.2 mg/dL   Indirect Bilirubin 0.2 0.2 - 1.2 mg/dL   Alkaline Phosphatase 42 33 - 130 U/L   AST 22 10 - 35 U/L   ALT 37 (H) 6 - 29 U/L   Total Protein 7.0 6.1 - 8.1 g/dL   Albumin 3.9 3.6 - 5.1 g/dL  Hemoglobin A1c     Status: Abnormal   Collection Time: 06/04/16 12:13 PM  Result Value Ref Range   Hgb A1c MFr Bld 7.4 (H) <5.7 %    Comment:   For someone without known diabetes, a hemoglobin A1c value of 6.5% or greater indicates that they may have diabetes and this should be confirmed with a follow-up test.   For someone with known diabetes, a value <7% indicates that their diabetes is well controlled and a value greater than or equal to 7% indicates suboptimal control. A1c targets should be individualized based on duration of diabetes, age, comorbid conditions, and other considerations.   Currently, no  consensus exists for use of hemoglobin A1c for diagnosis of diabetes for children.      Mean Plasma Glucose 166 mg/dL

## 2016-07-22 NOTE — Assessment & Plan Note (Addendum)
Dr Neita Garnet- Cards- saw pt 11/17 of 2017:  "Alicia Medina is seen today for follow up CAD. She has had an inferior MI treated with BareMetalStent  to the RCA in February 2013.   She required defibrillation multiple times and her hospitalization was complicated by cardiogenic shock, VF, VDRF and CHB requiring a temporary pacemaker.   She had abrupt occlusion of the RCA stent that was treated with a 2nd BMS to the RCA.    Followup nuclear stress testing in March 2013 showed normal perfusion throughout with ejection fraction of 76%.  - Cardiovascular care per Dr Neita Garnet

## 2016-07-22 NOTE — Assessment & Plan Note (Signed)
Last issue with them was yrs ago.

## 2016-07-22 NOTE — Assessment & Plan Note (Addendum)
Had thyroid removed in 10/ 2008- patient not sure why.   Been on synthroid ever since; no symptoms.  We'll obtain labs near future.

## 2016-07-22 NOTE — Patient Instructions (Signed)
\Diabetes Mellitus and Food It is important for you to manage your blood sugar (glucose) level. Your blood glucose level can be greatly affected by what you eat. Eating healthier foods in the appropriate amounts throughout the day at about the same time each day will help you control your blood glucose level. It can also help slow or prevent worsening of your diabetes mellitus. Healthy eating may even help you improve the level of your blood pressure and reach or maintain a healthy weight. General recommendations for healthful eating and cooking habits include:  Eating meals and snacks regularly. Avoid going long periods of time without eating to lose weight.  Eating a diet that consists mainly of plant-based foods, such as fruits, vegetables, nuts, legumes, and whole grains.  Using low-heat cooking methods, such as baking, instead of high-heat cooking methods, such as deep frying. Work with your dietitian to make sure you understand how to use the Nutrition Facts information on food labels. How can food affect me? Carbohydrates  Carbohydrates affect your blood glucose level more than any other type of food. Your dietitian will help you determine how many carbohydrates to eat at each meal and teach you how to count carbohydrates. Counting carbohydrates is important to keep your blood glucose at a healthy level, especially if you are using insulin or taking certain medicines for diabetes mellitus. Alcohol  Alcohol can cause sudden decreases in blood glucose (hypoglycemia), especially if you use insulin or take certain medicines for diabetes mellitus. Hypoglycemia can be a life-threatening condition. Symptoms of hypoglycemia (sleepiness, dizziness, and disorientation) are similar to symptoms of having too much alcohol. If your health care provider has given you approval to drink alcohol, do so in moderation and use the following guidelines:  Women should not have more than one drink per day, and men  should not have more than two drinks per day. One drink is equal to:  12 oz of beer.  5 oz of wine.  1 oz of hard liquor.  Do not drink on an empty stomach.  Keep yourself hydrated. Have water, diet soda, or unsweetened iced tea.  Regular soda, juice, and other mixers might contain a lot of carbohydrates and should be counted. What foods are not recommended? As you make food choices, it is important to remember that all foods are not the same. Some foods have fewer nutrients per serving than other foods, even though they might have the same number of calories or carbohydrates. It is difficult to get your body what it needs when you eat foods with fewer nutrients. Examples of foods that you should avoid that are high in calories and carbohydrates but low in nutrients include:  Trans fats (most processed foods list trans fats on the Nutrition Facts label).  Regular soda.  Juice.  Candy.  Sweets, such as cake, pie, doughnuts, and cookies.  Fried foods. What foods can I eat? Eat nutrient-rich foods, which will nourish your body and keep you healthy. The food you should eat also will depend on several factors, including:  The calories you need.  The medicines you take.  Your weight.  Your blood glucose level.  Your blood pressure level.  Your cholesterol level. You should eat a variety of foods, including:  Protein.  Lean cuts of meat.  Proteins low in saturated fats, such as fish, egg whites, and beans. Avoid processed meats.  Fruits and vegetables.  Fruits and vegetables that may help control blood glucose levels, such as apples, mangoes, and  yams.  Dairy products.  Choose fat-free or low-fat dairy products, such as milk, yogurt, and cheese.  Grains, bread, pasta, and rice.  Choose whole grain products, such as multigrain bread, whole oats, and brown rice. These foods may help control blood pressure.  Fats.  Foods containing healthful fats, such as nuts,  avocado, olive oil, canola oil, and fish. Does everyone with diabetes mellitus have the same meal plan? Because every person with diabetes mellitus is different, there is not one meal plan that works for everyone. It is very important that you meet with a dietitian who will help you create a meal plan that is just right for you. This information is not intended to replace advice given to you by your health care provider. Make sure you discuss any questions you have with your health care provider. Document Released: 04/09/2005 Document Revised: 12/19/2015 Document Reviewed: 06/09/2013 Elsevier Interactive Patient Education  2017 [place].      Please realize, EXERCISE IS MEDICINE!  -  American Heart Association ( AHA) guidelines for exercise : If you are in good health, without any medical conditions, you should engage in 150 minutes of moderate intensity aerobic activity per week.  This means you should be huffing and puffing throughout your workout.   Engaging in regular exercise will improve brain function and memory, as well as improve mood, boost immune system and help with weight management.  As well as the other, more well-known effects of exercise such as decreasing blood sugar levels, decreasing blood pressure,  and decreasing bad cholesterol levels/ increasing good cholesterol levels.     -  The AHA strongly endorses consumption of a diet that contains a variety of foods from all the food categories with an emphasis on fruits and vegetables; fat-free and low-fat dairy products; cereal and grain products; legumes and nuts; and fish, poultry, and/or extra lean meats.    Excessive food intake, especially of foods high in saturated and trans fats, sugar, and salt, should be avoided.    Adequate water intake of roughly 1/2 of your weight in pounds, should equal the ounces of water per day you should drink.  So for instance, if you're 200 pounds, that would be 100 ounces of water per  day.         Mediterranean Diet  Why follow it? Research shows. . Those who follow the Mediterranean diet have a reduced risk of heart disease  . The diet is associated with a reduced incidence of Parkinson's and Alzheimer's diseases . People following the diet may have longer life expectancies and lower rates of chronic diseases  . The Dietary Guidelines for Americans recommends the Mediterranean diet as an eating plan to promote health and prevent disease  What Is the Mediterranean Diet?  . Healthy eating plan based on typical foods and recipes of Mediterranean-style cooking . The diet is primarily a plant based diet; these foods should make up a majority of meals   Starches - Plant based foods should make up a majority of meals - They are an important sources of vitamins, minerals, energy, antioxidants, and fiber - Choose whole grains, foods high in fiber and minimally processed items  - Typical grain sources include wheat, oats, barley, corn, brown rice, bulgar, farro, millet, polenta, couscous  - Various types of beans include chickpeas, lentils, fava beans, black beans, white beans   Fruits  Veggies - Large quantities of antioxidant rich fruits & veggies; 6 or more servings  - Vegetables can  be eaten raw or lightly drizzled with oil and cooked  - Vegetables common to the traditional Mediterranean Diet include: artichokes, arugula, beets, broccoli, brussel sprouts, cabbage, carrots, celery, collard greens, cucumbers, eggplant, kale, leeks, lemons, lettuce, mushrooms, okra, onions, peas, peppers, potatoes, pumpkin, radishes, rutabaga, shallots, spinach, sweet potatoes, turnips, zucchini - Fruits common to the Mediterranean Diet include: apples, apricots, avocados, cherries, clementines, dates, figs, grapefruits, grapes, melons, nectarines, oranges, peaches, pears, pomegranates, strawberries, tangerines  Fats - Replace butter and margarine with healthy oils, such as olive oil, canola  oil, and tahini  - Limit nuts to no more than a handful a day  - Nuts include walnuts, almonds, pecans, pistachios, pine nuts  - Limit or avoid candied, honey roasted or heavily salted nuts - Olives are central to the [employer] - can be eaten whole or used in a variety of dishes   Meats Protein - Limiting red meat: no more than a few times a month - When eating red meat: choose lean cuts and keep the portion to the size of deck of cards - Eggs: approx. 0 to 4 times a week  - Fish and lean poultry: at least 2 a week  - Healthy protein sources include, chicken, [place], lean beef, lamb - Increase intake of seafood such as tuna, salmon, trout, mackerel, shrimp, scallops - Avoid or limit high fat processed meats such as sausage and bacon  Dairy - Include moderate amounts of low fat dairy products  - Focus on healthy dairy such as fat free yogurt, skim milk, low or reduced fat cheese - Limit dairy products higher in fat such as whole or 2% milk, cheese, ice cream  Alcohol - Moderate amounts of red wine is ok  - No more than 5 oz daily for women (all ages) and men older than age 88  - No more than 10 oz of wine daily for men younger than 98  Other - Limit sweets and other desserts  - Use herbs and spices instead of salt to flavor foods  - Herbs and spices common to the traditional Mediterranean Diet include: basil, bay leaves, chives, cloves, cumin, fennel, garlic, lavender, marjoram, mint, oregano, parsley, pepper, rosemary, sage, savory, sumac, tarragon, thyme   It's not just a diet, it's a lifestyle:  . The Mediterranean diet includes lifestyle factors typical of those in the region  . Foods, drinks and meals are best eaten with others and savored . Daily physical activity is important for overall good health . This could be strenuous exercise like running and aerobics . This could also be more leisurely activities such as walking, housework, yard-work, or taking the  stairs . Moderation is the key; a balanced and healthy diet accommodates most foods and drinks . Consider portion sizes and frequency of consumption of certain foods   Meal Ideas & Options:  . Breakfast:  o Whole wheat toast or whole wheat English muffins with peanut butter & hard boiled egg o Steel cut oats topped with apples & cinnamon and skim milk  o Fresh fruit: banana, strawberries, melon, berries, peaches  o Smoothies: strawberries, bananas, greek yogurt, peanut butter o Low fat greek yogurt with blueberries and granola  o Egg white omelet with spinach and mushrooms o Breakfast couscous: whole wheat couscous, apricots, skim milk, cranberries  . Sandwiches:  o Hummus and grilled vegetables (peppers, zucchini, squash) on whole wheat bread   o Grilled chicken on whole wheat pita with lettuce, tomatoes, cucumbers or tzatziki  o  Tuna salad on whole wheat bread: tuna salad made with greek yogurt, olives, red peppers, capers, green onions o Garlic rosemary lamb pita: lamb sauted with garlic, rosemary, salt & pepper; add lettuce, cucumber, greek yogurt to pita - flavor with lemon juice and black pepper  . Seafood:  o Mediterranean grilled salmon, seasoned with garlic, basil, parsley, lemon juice and black pepper o Shrimp, lemon, and spinach whole-grain pasta salad made with low fat greek yogurt  o Seared scallops with lemon orzo  o Seared tuna steaks seasoned salt, pepper, coriander topped with tomato mixture of olives, tomatoes, olive oil, minced garlic, parsley, green onions and cappers  . Meats:  o Herbed greek chicken salad with kalamata olives, cucumber, feta  o Red bell peppers stuffed with spinach, bulgur, lean ground beef (or lentils) & topped with feta   o Kebabs: skewers of chicken, tomatoes, onions, zucchini, squash  o [place] burgers: made with red onions, mint, dill, lemon juice, feta cheese topped with roasted red peppers . Vegetarian o Cucumber salad: cucumbers, artichoke  hearts, celery, red onion, feta cheese, tossed in olive oil & lemon juice  o Hummus and whole grain pita points with a greek salad (lettuce, tomato, feta, olives, cucumbers, red onion) o Lentil soup with celery, carrots made with vegetable broth, garlic, salt and pepper  o Tabouli salad: parsley, bulgur, mint, scallions, cucumbers, tomato, radishes, lemon juice, olive oil, salt and pepper.

## 2016-07-22 NOTE — Assessment & Plan Note (Addendum)
-   dx about 15-20 yrs ago.  Takes metformin XR 500mg  tab and insulin 15 U q hs. - well controlled.    Lab Results  Component Value Date   HGBA1C 7.4 (H) 06/04/2016   HGBA1C 7.6 (H) 11/12/2015   HGBA1C 7.3 (H) 08/28/2011   -We will continue to monitor;  prudent diet and increasing activity level discussed with patient.  - Reminded of need for yearly eye exam.

## 2016-08-16 NOTE — Assessment & Plan Note (Signed)
Well controlled.  Meds per cardiology.

## 2016-08-16 NOTE — Assessment & Plan Note (Signed)
Patient on a statin as directed by cardiology.  Denies side effects.

## 2016-08-24 DIAGNOSIS — N812 Incomplete uterovaginal prolapse: Secondary | ICD-10-CM | POA: Diagnosis not present

## 2016-09-01 DIAGNOSIS — M1711 Unilateral primary osteoarthritis, right knee: Secondary | ICD-10-CM | POA: Diagnosis not present

## 2016-09-01 DIAGNOSIS — M25561 Pain in right knee: Secondary | ICD-10-CM | POA: Diagnosis not present

## 2016-09-03 ENCOUNTER — Ambulatory Visit (INDEPENDENT_AMBULATORY_CARE_PROVIDER_SITE_OTHER): Payer: Medicare Other | Admitting: Adult Health

## 2016-09-03 ENCOUNTER — Encounter: Payer: Self-pay | Admitting: Adult Health

## 2016-09-03 VITALS — BP 120/66 | HR 92 | Ht 65.0 in | Wt 155.3 lb

## 2016-09-03 DIAGNOSIS — Z794 Long term (current) use of insulin: Secondary | ICD-10-CM | POA: Diagnosis not present

## 2016-09-03 DIAGNOSIS — E1169 Type 2 diabetes mellitus with other specified complication: Secondary | ICD-10-CM | POA: Diagnosis not present

## 2016-09-03 DIAGNOSIS — L0291 Cutaneous abscess, unspecified: Secondary | ICD-10-CM | POA: Insufficient documentation

## 2016-09-03 DIAGNOSIS — E119 Type 2 diabetes mellitus without complications: Secondary | ICD-10-CM | POA: Diagnosis not present

## 2016-09-03 LAB — POCT UA - MICROALBUMIN
CREATININE, POC: 100 mg/dL
Microalbumin Ur, POC: 30 mg/L

## 2016-09-03 LAB — POCT GLYCOSYLATED HEMOGLOBIN (HGB A1C): HEMOGLOBIN A1C: 7.7

## 2016-09-03 MED ORDER — SULFAMETHOXAZOLE-TRIMETHOPRIM 800-160 MG PO TABS: 1.0000 | ORAL_TABLET | Freq: Two times a day (BID) | ORAL | 0 refills | Status: DC

## 2016-09-03 MED ORDER — METFORMIN HCL ER 500 MG PO TB24: 1500.0000 mg | ORAL_TABLET | Freq: Every day | ORAL | 1 refills | Status: DC

## 2016-09-03 NOTE — Patient Instructions (Addendum)
Diabetes Mellitus and Food It is important for you to manage your blood sugar (glucose) level. Your blood glucose level can be greatly affected by what you eat. Eating healthier foods in the appropriate amounts throughout the day at about the same time each day will help you control your blood glucose level. It can also help slow or prevent worsening of your diabetes mellitus. Healthy eating may even help you improve the level of your blood pressure and reach or maintain a healthy weight. General recommendations for healthful eating and cooking habits include:  Eating meals and snacks regularly. Avoid going long periods of time without eating to lose weight.  Eating a diet that consists mainly of plant-based foods, such as fruits, vegetables, nuts, legumes, and whole grains.  Using low-heat cooking methods, such as baking, instead of high-heat cooking methods, such as deep frying.  Work with your dietitian to make sure you understand how to use the Nutrition Facts information on food labels. How can food affect me? Carbohydrates Carbohydrates affect your blood glucose level more than any other type of food. Your dietitian will help you determine how many carbohydrates to eat at each meal and teach you how to count carbohydrates. Counting carbohydrates is important to keep your blood glucose at a healthy level, especially if you are using insulin or taking certain medicines for diabetes mellitus. Alcohol Alcohol can cause sudden decreases in blood glucose (hypoglycemia), especially if you use insulin or take certain medicines for diabetes mellitus. Hypoglycemia can be a life-threatening condition. Symptoms of hypoglycemia (sleepiness, dizziness, and disorientation) are similar to symptoms of having too much alcohol. If your health care provider has given you approval to drink alcohol, do so in moderation and use the following guidelines:  Women should not have more than one drink per day, and men  should not have more than two drinks per day. One drink is equal to: ? 12 oz of beer. ? 5 oz of wine. ? 1 oz of hard liquor.  Do not drink on an empty stomach.  Keep yourself hydrated. Have water, diet soda, or unsweetened iced tea.  Regular soda, juice, and other mixers might contain a lot of carbohydrates and should be counted.  What foods are not recommended? As you make food choices, it is important to remember that all foods are not the same. Some foods have fewer nutrients per serving than other foods, even though they might have the same number of calories or carbohydrates. It is difficult to get your body what it needs when you eat foods with fewer nutrients. Examples of foods that you should avoid that are high in calories and carbohydrates but low in nutrients include:  Trans fats (most processed foods list trans fats on the Nutrition Facts label).  Regular soda.  Juice.  Candy.  Sweets, such as cake, pie, doughnuts, and cookies.  Fried foods.  What foods can I eat? Eat nutrient-rich foods, which will nourish your body and keep you healthy. The food you should eat also will depend on several factors, including:  The calories you need.  The medicines you take.  Your weight.  Your blood glucose level.  Your blood pressure level.  Your cholesterol level.  You should eat a variety of foods, including:  Protein. ? Lean cuts of meat. ? Proteins low in saturated fats, such as fish, egg whites, and beans. Avoid processed meats.  Fruits and vegetables. ? Fruits and vegetables that may help control blood glucose levels, such as apples,   yams.  Dairy products.  Choose fat-free or low-fat dairy products, such as milk, yogurt, and cheese.  Grains, bread, pasta, and rice.  Choose whole grain products, such as multigrain bread, whole oats, and brown rice. These foods may help control blood pressure.  Fats.  Foods containing healthful fats, such as nuts,  avocado, olive oil, canola oil, and fish. Does everyone with diabetes mellitus have the same meal plan? Because every person with diabetes mellitus is different, there is not one meal plan that works for everyone. It is very important that you meet with a dietitian who will help you create a meal plan that is just right for you. This information is not intended to replace advice given to you by your health care provider. Make sure you discuss any questions you have with your health care provider. Document Released: 04/09/2005 Document Revised: 12/19/2015 Document Reviewed: 06/09/2013 Elsevier Interactive Patient Education  2017 [place].   Diabetes Mellitus and Exercise Exercising regularly is important for your overall health, especially when you have diabetes (diabetes mellitus). Exercising is not only about losing weight. It has many health benefits, such as increasing muscle strength and bone density and reducing body fat and stress. This leads to improved fitness, flexibility, and endurance, all of which result in better overall health. Exercise has additional benefits for people with diabetes, including:  Reducing appetite.  Helping to lower and control blood glucose.  Lowering blood pressure.  Helping to control amounts of fatty substances (lipids) in the blood, such as cholesterol and triglycerides.  Helping the body to respond better to insulin (improving insulin sensitivity).  Reducing how much insulin the body needs.  Decreasing the risk for heart disease by:  Lowering cholesterol and triglyceride levels.  Increasing the levels of good cholesterol.  Lowering blood glucose levels. What is my activity plan? Your health care provider or certified diabetes educator can help you make a plan for the type and frequency of exercise (activity plan) that works for you. Make sure that you:  Do at least 150 minutes of moderate-intensity or vigorous-intensity exercise each week.  This could be brisk walking, biking, or water aerobics.  Do stretching and strength exercises, such as yoga or weightlifting, at least 2 times a week.  Spread out your activity over at least 3 days of the week.  Get some form of physical activity every day.  Do not go more than 2 days in a row without some kind of physical activity.  Avoid being inactive for more than 90 minutes at a time. Take frequent breaks to walk or stretch.  Choose a type of exercise or activity that you enjoy, and set realistic goals.  Start slowly, and gradually increase the intensity of your exercise over time. What do I need to know about managing my diabetes?  Check your blood glucose before and after exercising.  If your blood glucose is higher than 240 mg/dL (13.3 mmol/L) before you exercise, check your urine for ketones. If you have ketones in your urine, do not exercise until your blood glucose returns to normal.  Know the symptoms of low blood glucose (hypoglycemia) and how to treat it. Your risk for hypoglycemia increases during and after exercise. Common symptoms of hypoglycemia can include:  Hunger.  Anxiety.  Sweating and feeling clammy.  Confusion.  Dizziness or feeling light-headed.  Increased heart rate or palpitations.  Blurry vision.  Tingling or numbness around the mouth, lips, or tongue.  Tremors or shakes.  Irritability.  Keep  a rapid-acting carbohydrate snack available before, during, and after exercise to help prevent or treat hypoglycemia.  Avoid injecting insulin into areas of the body that are going to be exercised. For example, avoid injecting insulin into:  The arms, when playing tennis.  The legs, when jogging.  Keep records of your exercise habits. Doing this can help you and your health care provider adjust your diabetes management plan as needed. Write down:  Food that you eat before and after you exercise.  Blood glucose levels before and after you  exercise.  The type and amount of exercise you have done.  When your insulin is expected to peak, if you use insulin. Avoid exercising at times when your insulin is peaking.  When you start a new exercise or activity, work with your health care provider to make sure the activity is safe for you, and to adjust your insulin, medicines, or food intake as needed.  Drink plenty of water while you exercise to prevent dehydration or heat stroke. Drink enough fluid to keep your urine clear or pale yellow. This information is not intended to replace advice given to you by your health care provider. Make sure you discuss any questions you have with your health care provider. Document Released: 10/03/2003 Document Revised: 01/31/2016 Document Reviewed: 12/23/2015 Elsevier Interactive Patient Education  2017 [employer]   Cellulitis, Adult Introduction Cellulitis is a skin infection. The infected area is usually red and sore. This condition occurs most often in the arms and lower legs. It is very important to get treated for this condition. Follow these instructions at home:  Take over-the-counter and prescription medicines only as told by your doctor.  If you were prescribed an antibiotic medicine, take it as told by your doctor. Do not stop taking the antibiotic even if you start to feel better.  Drink enough fluid to keep your pee (urine) clear or pale yellow.  Do not touch or rub the infected area.  Raise (elevate) the infected area above the level of your heart while you are sitting or lying down.  Place warm or cold wet cloths (warm or cold compresses) on the infected area. Do this as told by your doctor.  Keep all follow-up visits as told by your doctor. This is important. These visits let your doctor make sure your infection is not getting worse. Contact a doctor if:  You have a fever.  Your symptoms do not get better after 1-2 days of treatment.  Your bone or joint under the  infected area starts to hurt after the skin has healed.  Your infection comes back. This can happen in the same area or another area.  You have a swollen bump in the infected area.  You have new symptoms.  You feel ill and also have muscle aches and pains. Get help right away if:  Your symptoms get worse.  You feel very sleepy.  You throw up (vomit) or have watery poop (diarrhea) for a long time.  There are red streaks coming from the infected area.  Your red area gets larger.  Your red area turns darker. This information is not intended to replace advice given to you by your health care provider. Make sure you discuss any questions you have with your health care provider. Document Released: 12/30/2007 Document Revised: 12/19/2015 Document Reviewed: 05/22/2015  2017 Elsevier  Please increase Metformin: 2 tablets with breakfast and 1 tablet with dinner-total of 3 tablets daily. Increase daily movement and improve diet. Antibiotic  given for abscess on posterior back, referral for Dermatology placed.   Please return in 3 months for A1c re-check and regular follow-up, sooner if needed.

## 2016-09-03 NOTE — Progress Notes (Signed)
Subjective:    Patient ID: Alicia Medina, female    DOB: 21-Apr-1933, 83 y.o.   MRN: [id number]  HPI:  Alicia Medina presents for regular f/u for T2D.  She has been taking Metformin 500mg -2 tablets with breakfast and nightly Lantus 15 U.  She denies CP/dyspnea/N/V/hypoglycemic episodes.  She doe not "get regular exercise, however I try to move around the house".  She also reports flare up of posterior back abscess-last 2 weeks it has increased in size, become warm, and intermittently drains clear-yellow fluid.  She reports that abscess has been present for at least 8 years and "comes and goes in size".  She denies fever/night sweats/poor appetite/malaise.    Patient Care Team    Relationship Specialty Notifications Start End  Mellody Dance, DO PCP - General Family Medicine  06/12/16   Peter M Martinique, MD Consulting Physician Cardiology  07/22/16   Maisie Fus, MD Consulting Physician Obstetrics and Gynecology  [id number]   Leighton Ruff, MD Consulting Physician General Surgery  07/22/16   Armandina Gemma, MD Consulting Physician General Surgery  07/22/16   Daryll Brod, MD Consulting Physician Orthopedic Surgery  07/22/16     Patient Active Problem List   Diagnosis Date Noted  . Abscess 09/03/2016  . Hypothyroidism, postsurgical 07/22/2016  . HTN (hypertension) 09/27/2014  . Hemorrhoids, internal, with bleeding 05/08/2013  . Dyslipidemia 11/12/2011  . Coronary artery disease 11/12/2011  . Old inferior wall myocardial infarction 08/29/2011  . DM2 (diabetes mellitus, type 2) 08/29/2011     Past Medical History:  Diagnosis Date  . Arthritis   . CAD (coronary artery disease) CARDIOLOGIST-  DR Martinique   08-28-2011 Inferior STEMI with BMS to the RCA, complicated by cardiogenic shock, VF, VDRF , temporary CHB and abrupt reocclusion of the RCA stent treated with a second BMS to the RCA. Has residual LAD disease. EF is 50%  . Diverticulosis of colon   . Hemorrhoids, internal, with bleeding   .  Hyperlipidemia   . Hypertension   . Hypothyroidism, postsurgical   . S/p bare metal coronary artery stent    2013  . Type 2 diabetes mellitus ([place])   . Wears hearing aid    both ears     Past Surgical History:  Procedure Laterality Date  . CARDIOVASCULAR STRESS TEST  10-03-2011  dr Martinique   normal perfusion / no ischemia/ ef 77%  . CARPAL TUNNEL RELEASE Right 01/25/2013   Procedure: CARPAL TUNNEL RELEASE;  Surgeon: Wynonia Sours, MD;  Location: [place];  Service: Orthopedics;  Laterality: Right;  . CARPAL TUNNEL RELEASE Left 10/25/2013   Procedure: LEFT CARPAL TUNNEL RELEASE;  Surgeon: Wynonia Sours, MD;  Location: [place];  Service: Orthopedics;  Laterality: Left;  . CATARACT EXTRACTION W/ INTRAOCULAR LENS  IMPLANT, BILATERAL  2007  . COLONOSCOPY  04-27-2003  . CORONARY ANGIOGRAM Left 08/28/2011   Procedure: CORONARY ANGIOGRAM;  Surgeon: Peter M Martinique, MD;  Location: [facility] CATH LAB;  Service: Cardiovascular;  Laterality: Left;  . CORONARY ANGIOPLASTY WITH STENT PLACEMENT  08-28-2011  dr Martinique   BMS to [facility] with abrupt reocclusion treated with second BMS/  LAD ostial 90%, proximal50-60%, first diagonal 80-90%  . LEFT HEART CATHETERIZATION WITH CORONARY ANGIOGRAM N/A 08/28/2011   Procedure: LEFT HEART CATHETERIZATION WITH CORONARY ANGIOGRAM;  Surgeon: Peter M Martinique, MD;  Location: [facility] CATH LAB;  Service: Cardiovascular;  Laterality: N/A;  . PERCUTANEOUS CORONARY STENT INTERVENTION (PCI-S) Left 08/28/2011   Procedure: PERCUTANEOUS  CORONARY STENT INTERVENTION (PCI-S);  Surgeon: Peter M Martinique, MD;  Location: [facility] CATH LAB;  Service: Cardiovascular;  Laterality: Left;  . TOTAL THYROIDECTOMY Bilateral 05-02-2007   multinodular goiter  . TRANSANAL HEMORRHOIDAL DEARTERIALIZATION N/A 03/28/2014   Procedure: TRANSANAL HEMORRHOIDAL DEARTERIALIZATION;  Surgeon: Leighton Ruff, MD;  Location: [facility];  Service: General;  Laterality: N/A;  . TRANSTHORACIC  ECHOCARDIOGRAM  08-30-2011   mild LVH/ ef [id number]  grade I diastolic dysfunction  . TRIGGER FINGER RELEASE Right 01/25/2013   Procedure: RELEASE TRIGGER FINGER/A-1 PULLEY RIGHT INDEX FINGER;  Surgeon: Wynonia Sours, MD;  Location: [place];  Service: Orthopedics;  Laterality: Right;  . TRIGGER FINGER RELEASE Left 10/25/2013   Procedure: RELEASE A-1 PULLEY LEFT MIDDLE FINGER;  Surgeon: Wynonia Sours, MD;  Location: [place];  Service: Orthopedics;  Laterality: Left;  . TRIGGER FINGER RELEASE Left 12/04/2014   Procedure: RELEASE TRIGGER FINGER/A-1 PULLEY LEFT RING FINGER;  Surgeon: Daryll Brod, MD;  Location: [place];  Service: Orthopedics;  Laterality: Left;     Family History  Problem Relation Age of Onset  . Stroke Mother   . Cancer Mother     breast  . Cancer Father     lung  . Stroke Father      History  Drug Use No     History  Alcohol Use  . 0.6 oz/week  . 1 Glasses of wine per week    Comment: occ     History  Smoking Status  . Never Smoker  Smokeless Tobacco  . Never Used     Outpatient Encounter Prescriptions as of 09/03/2016  Medication Sig Note  . aspirin 81 MG tablet Take 81 mg by mouth daily.   . calcium carbonate (CALCIUM 600) 1500 (600 Ca) MG TABS tablet Take 600 mg of elemental calcium by mouth daily with breakfast.   . Coenzyme Q10 200 MG TABS Take 1 tablet by mouth daily.   . Cyanocobalamin (B-12) 2500 MCG TABS Take 1 tablet by mouth daily.   Marland Kitchen gelatin 650 MG capsule Take 1,300 mg by mouth daily.   . insulin glargine (LANTUS) 100 UNIT/ML injection Inject 15 Units into the skin at bedtime.   Marland Kitchen levothyroxine (SYNTHROID, LEVOTHROID) 150 MCG tablet Take 150 mcg by mouth daily before breakfast.   . loperamide (IMODIUM) 2 MG capsule Take by mouth as needed for diarrhea or loose stools.   Marland Kitchen losartan (COZAAR) 25 MG tablet Take 25 mg by mouth every morning.    . metFORMIN (GLUCOPHAGE XR) 500 MG 24 hr tablet Take 3  tablets (1,500 mg total) by mouth daily with breakfast. Two tablets with breakfast and One tablet with dinner   . metoprolol (LOPRESSOR) 50 MG tablet Take 1 tablet (50 mg total) by mouth 2 (two) times daily.   . Multiple Vitamin (MULTIVITAMIN) tablet Take 1 tablet by mouth daily.   . potassium chloride SA (K-DUR,KLOR-CON) 20 MEQ tablet TAKE 1 TABLET BY MOUTH DAILY.   . rosuvastatin (CRESTOR) 20 MG tablet Take 1 tablet (20 mg total) by mouth daily.   . S-Adenosylmethionine (SAM-E) 400 MG TABS Take 1 tablet by mouth daily.   Angelia Mould TEST test strip  11/20/2015: Received from: External Pharmacy  . vitamin C (ASCORBIC ACID) 500 MG tablet Take 500 mg by mouth daily.   Marland Kitchen VITAMIN E PO Take 500 mg by mouth daily.   . [DISCONTINUED] metFORMIN (GLUCOPHAGE XR) 500 MG 24 hr tablet Take 2  tablets (1,000 mg total) by mouth daily with breakfast.   . sulfamethoxazole-trimethoprim (BACTRIM DS,SEPTRA DS) 800-160 MG tablet Take 1 tablet by mouth 2 (two) times daily.    No facility-administered encounter medications on file as of 09/03/2016.     Allergies: Demeclocycline; Lisinopril; and Tetracyclines & related  Body mass index is 25.84 kg/m.  Blood pressure 120/66, pulse 92, height 5\' 5"  (1.651 m), weight 155 lb 4.8 oz (70.4 kg).     Review of Systems  Constitutional: Negative for activity change, appetite change, chills, diaphoresis, fatigue, fever and unexpected weight change.  Eyes: Negative for visual disturbance.  Respiratory: Negative for cough and shortness of breath.   Cardiovascular: Negative for chest pain, palpitations and leg swelling.  Gastrointestinal: Negative for abdominal distention, abdominal pain, constipation, diarrhea and nausea.  Endocrine: Negative for cold intolerance, heat intolerance, polydipsia, polyphagia and polyuria.  Genitourinary: Negative for difficulty urinating.  Musculoskeletal: Negative for gait problem.  Skin: Negative for color change, pallor, rash and wound.         Posterior back abscess has flared up in last 2 weeks. Has been present intermittently last 8 years. Reports dull ache ([id number]) with certain movements and palpation.    Neurological: Negative for dizziness, tremors and weakness.  Psychiatric/Behavioral: The patient is not nervous/anxious.        Objective:   Physical Exam  Constitutional: She is oriented to person, place, and time. She appears well-developed and well-nourished. No distress.  HENT:  Head: Normocephalic and atraumatic.  Eyes: Conjunctivae and EOM are normal. Pupils are equal, round, and reactive to light.  Cardiovascular: Normal rate, regular rhythm, normal heart sounds and intact distal pulses.   Pulmonary/Chest: Effort normal and breath sounds normal.  Neurological: She is alert and oriented to person, place, and time. She has normal reflexes.  Skin: Skin is warm. She is not diaphoretic. There is erythema. No pallor. Nails show no clubbing.     1 discrete, mobile, inflammed abscess. 1.7cm in diameter with open center. Clear drainage noted. No streaking noted.  Psychiatric: She has a normal mood and affect. Her behavior is normal. Judgment and thought content normal.  Nursing note and vitals reviewed.         Assessment & Plan:   1. Diabetes mellitus without complication ([place])   2. Type 2 diabetes mellitus with other specified complication, with long-term current use of insulin ([place])   3. Abscess     DM2 (diabetes mellitus, type 2) Increase Metformin 500mg -2 tablets with breakfast and 1 tablet with dinner. Continue Lantus 15 U nightly. Follow Diabetic diet and increase regular movement. Re-check A1c in 3 months.  Abscess Keep site clean/dy/covered. Bactrim Rx sent in. Dermatology referral sent.    FOLLOW-UP:  Return in about 3 months (around 12/01/2016) for Diabetes.

## 2016-09-03 NOTE — Assessment & Plan Note (Signed)
Keep site clean/dy/covered. Bactrim Rx sent in. Dermatology referral sent.

## 2016-09-03 NOTE — Assessment & Plan Note (Signed)
Increase Metformin 500mg -2 tablets with breakfast and 1 tablet with dinner. Continue Lantus 15 U nightly. Follow Diabetic diet and increase regular movement. Re-check A1c in 3 months.

## 2016-09-04 ENCOUNTER — Telehealth: Payer: Self-pay | Admitting: Family Medicine

## 2016-09-04 NOTE — Telephone Encounter (Signed)
Cld patient to inform of Appt. w/ [facility] provider on:   Monday, Feb 5th @ 2:00pm. Doctor: Dr.  Earma Reading. Office ph# [phone] --glh

## 2016-09-09 ENCOUNTER — Telehealth: Payer: Self-pay

## 2016-09-09 NOTE — Telephone Encounter (Signed)
Pt called stating that her insurance company is no longer going to cover [employer].  Will attempt PA and await decision.  Charyl Bigger, CMA

## 2016-09-11 ENCOUNTER — Encounter (HOSPITAL_COMMUNITY): Payer: Self-pay | Admitting: Emergency Medicine

## 2016-09-11 ENCOUNTER — Ambulatory Visit (HOSPITAL_COMMUNITY)
Admission: EM | Admit: 2016-09-11 | Discharge: 2016-09-11 | Disposition: A | Discharge status: Home / Self Care | Payer: Medicare Other | Attending: Family Medicine | Admitting: Family Medicine

## 2016-09-11 DIAGNOSIS — L0291 Cutaneous abscess, unspecified: Secondary | ICD-10-CM

## 2016-09-11 MED ORDER — AMOXICILLIN-POT CLAVULANATE 875-125 MG PO TABS: 1.0000 | ORAL_TABLET | Freq: Two times a day (BID) | ORAL | 0 refills | Status: DC

## 2016-09-11 NOTE — ED Triage Notes (Signed)
Pt c/o abscess on back onset "few" years  Reports it started out small and it kept getting bigger  Reports she had an antibiotic once and it went away  A&O x4... NAD

## 2016-09-11 NOTE — ED Provider Notes (Signed)
[place]    CSN: [id number] Arrival date & time: 09/11/16  1438     History   Chief Complaint Chief Complaint  Patient presents with  . Abscess    HPI Alicia Medina is a 83 y.o. female.   's an 83 year old woman with a sebaceous abscess in the mid part of her thoracic this is an 83 year old woman who comes in with a sebaceous abscess in the midsection of her middle thoracic spine. She's had eye swelling for years but last week it became red and tender. Her primary care doctor treated her with Septra without incising it and it's only gotten worse and is now starting to drain. It's become quite tender. She's had no      Past Medical History:  Diagnosis Date  . Arthritis   . CAD (coronary artery disease) CARDIOLOGIST-  DR Martinique   08-28-2011 Inferior STEMI with BMS to the RCA, complicated by cardiogenic shock, VF, VDRF , temporary CHB and abrupt reocclusion of the RCA stent treated with a second BMS to the RCA. Has residual LAD disease. EF is 50%  . Diverticulosis of colon   . Hemorrhoids, internal, with bleeding   . Hyperlipidemia   . Hypertension   . Hypothyroidism, postsurgical   . S/p bare metal coronary artery stent    2013  . Type 2 diabetes mellitus ([place])   . Wears hearing aid    both ears    Patient Active Problem List   Diagnosis Date Noted  . Abscess 09/03/2016  . Hypothyroidism, postsurgical 07/22/2016  . HTN (hypertension) 09/27/2014  . Hemorrhoids, internal, with bleeding 05/08/2013  . Dyslipidemia 11/12/2011  . Coronary artery disease 11/12/2011  . Old inferior wall myocardial infarction 08/29/2011  . DM2 (diabetes mellitus, type 2) 08/29/2011    Past Surgical History:  Procedure Laterality Date  . CARDIOVASCULAR STRESS TEST  10-03-2011  dr Martinique   normal perfusion / no ischemia/ ef 77%  . CARPAL TUNNEL RELEASE Right 01/25/2013   Procedure: CARPAL TUNNEL RELEASE;  Surgeon: Wynonia Sours, MD;  Location: [place];   Service: Orthopedics;  Laterality: Right;  . CARPAL TUNNEL RELEASE Left 10/25/2013   Procedure: LEFT CARPAL TUNNEL RELEASE;  Surgeon: Wynonia Sours, MD;  Location: [place];  Service: Orthopedics;  Laterality: Left;  . CATARACT EXTRACTION W/ INTRAOCULAR LENS  IMPLANT, BILATERAL  2007  . COLONOSCOPY  04-27-2003  . CORONARY ANGIOGRAM Left 08/28/2011   Procedure: CORONARY ANGIOGRAM;  Surgeon: Peter M Martinique, MD;  Location: [facility] CATH LAB;  Service: Cardiovascular;  Laterality: Left;  . CORONARY ANGIOPLASTY WITH STENT PLACEMENT  08-28-2011  dr Martinique   BMS to [facility] with abrupt reocclusion treated with second BMS/  LAD ostial 90%, proximal50-60%, first diagonal 80-90%  . LEFT HEART CATHETERIZATION WITH CORONARY ANGIOGRAM N/A 08/28/2011   Procedure: LEFT HEART CATHETERIZATION WITH CORONARY ANGIOGRAM;  Surgeon: Peter M Martinique, MD;  Location: [facility] CATH LAB;  Service: Cardiovascular;  Laterality: N/A;  . PERCUTANEOUS CORONARY STENT INTERVENTION (PCI-S) Left 08/28/2011   Procedure: PERCUTANEOUS CORONARY STENT INTERVENTION (PCI-S);  Surgeon: Peter M Martinique, MD;  Location: [facility] CATH LAB;  Service: Cardiovascular;  Laterality: Left;  . TOTAL THYROIDECTOMY Bilateral 05-02-2007   multinodular goiter  . TRANSANAL HEMORRHOIDAL DEARTERIALIZATION N/A 03/28/2014   Procedure: TRANSANAL HEMORRHOIDAL DEARTERIALIZATION;  Surgeon: Leighton Ruff, MD;  Location: [facility];  Service: General;  Laterality: N/A;  . TRANSTHORACIC ECHOCARDIOGRAM  08-30-2011   mild LVH/ ef 50-55%/  grade  I diastolic dysfunction  . TRIGGER FINGER RELEASE Right 01/25/2013   Procedure: RELEASE TRIGGER FINGER/A-1 PULLEY RIGHT INDEX FINGER;  Surgeon: Wynonia Sours, MD;  Location: [place];  Service: Orthopedics;  Laterality: Right;  . TRIGGER FINGER RELEASE Left 10/25/2013   Procedure: RELEASE A-1 PULLEY LEFT MIDDLE FINGER;  Surgeon: Wynonia Sours, MD;  Location: [place];  Service: Orthopedics;   Laterality: Left;  . TRIGGER FINGER RELEASE Left 12/04/2014   Procedure: RELEASE TRIGGER FINGER/A-1 PULLEY LEFT RING FINGER;  Surgeon: Daryll Brod, MD;  Location: [place];  Service: Orthopedics;  Laterality: Left;    OB History    No data available       Home Medications    Prior to Admission medications   Medication Sig Start Date End Date Taking? Authorizing Provider  aspirin 81 MG tablet Take 81 mg by mouth daily.   Yes Historical Provider, MD  calcium carbonate (CALCIUM 600) 1500 (600 Ca) MG TABS tablet Take 600 mg of elemental calcium by mouth daily with breakfast.   Yes Historical Provider, MD  insulin glargine (LANTUS) 100 UNIT/ML injection Inject 15 Units into the skin at bedtime.   Yes Historical Provider, MD  levothyroxine (SYNTHROID, LEVOTHROID) 150 MCG tablet Take 150 mcg by mouth daily before breakfast.   Yes Historical Provider, MD  losartan (COZAAR) 25 MG tablet Take 25 mg by mouth every morning.    Yes Historical Provider, MD  metFORMIN (GLUCOPHAGE XR) 500 MG 24 hr tablet Take 3 tablets (1,500 mg total) by mouth daily with breakfast. Two tablets with breakfast and One tablet with dinner 09/03/16 09/03/17 Yes Odella Aquas, NP  metoprolol (LOPRESSOR) 50 MG tablet Take 1 tablet (50 mg total) by mouth 2 (two) times daily. 06/29/16  Yes Peter M Martinique, MD  Multiple Vitamin (MULTIVITAMIN) tablet Take 1 tablet by mouth daily.   Yes Historical Provider, MD  potassium chloride SA (K-DUR,KLOR-CON) 20 MEQ tablet TAKE 1 TABLET BY MOUTH DAILY. 06/29/16  Yes Peter M Martinique, MD  rosuvastatin (CRESTOR) 20 MG tablet Take 1 tablet (20 mg total) by mouth daily. 06/29/16  Yes Peter M Martinique, MD  vitamin C (ASCORBIC ACID) 500 MG tablet Take 500 mg by mouth daily.   Yes Historical Provider, MD  VITAMIN E PO Take 500 mg by mouth daily.   Yes Historical Provider, MD  amoxicillin-clavulanate (AUGMENTIN) 875-125 MG tablet Take 1 tablet by mouth every 12 (twelve) hours. 09/11/16   Robyn Haber, MD  Coenzyme Q10 200 MG TABS Take 1 tablet by mouth daily.    Historical Provider, MD  Cyanocobalamin (B-12) 2500 MCG TABS Take 1 tablet by mouth daily.    Historical Provider, MD  loperamide (IMODIUM) 2 MG capsule Take by mouth as needed for diarrhea or loose stools.    Historical Provider, MD  S-Adenosylmethionine (SAM-E) 400 MG TABS Take 1 tablet by mouth daily.    Historical Provider, MD  [place] TEST test strip  09/07/15   Historical Provider, MD    Family History Family History  Problem Relation Age of Onset  . Stroke Mother   . Cancer Mother     breast  . Cancer Father     lung  . Stroke Father     Social History Social History  Substance Use Topics  . Smoking status: Never Smoker  . Smokeless tobacco: Never Used  . Alcohol use 0.6 oz/week    1 Glasses of wine per week  Comment: occ     Allergies   Demeclocycline; Lisinopril; and Tetracyclines & related   Review of Systems Review of Systems  Constitutional: Negative.   Skin: Positive for wound.     Physical Exam Triage Vital Signs ED Triage Vitals [09/11/16 1554]  Enc Vitals Group     BP 127/55     Pulse Rate 82     Resp 16     Temp 98.3 F (36.8 C)     Temp Source Oral     SpO2 99 %     Weight      Height      Head Circumference      Peak Flow      Pain Score      Pain Loc      Pain Edu?      Excl. in [place]?    No data found.   Updated Vital Signs BP 127/55 (BP Location: Left Arm)   Pulse 82   Temp 98.3 F (36.8 C) (Oral)   Resp 16   SpO2 99%    Physical Exam  Constitutional: She is oriented to person, place, and time. She appears well-developed and well-nourished.  HENT:  Head: Normocephalic.  Right Ear: External ear normal.  Left Ear: External ear normal.  Mouth/Throat: Oropharynx is clear and moist.  Eyes: Conjunctivae and EOM are normal.  Neck: Normal range of motion. Neck supple.  Pulmonary/Chest: Effort normal.  Musculoskeletal: Normal range of motion.    Neurological: She is alert and oriented to person, place, and time.  Skin: Skin is warm and dry.  Patient has a 2 cm raised tender subcutaneous mass over T5 with surrounding and overlying erythema.  Nursing note and vitals reviewed.    UC Treatments / Results  Labs (all labs ordered are listed, but only abnormal results are displayed) Labs Reviewed - No data to display  EKG  EKG Interpretation None       Radiology No results found.  Procedures .Marland KitchenIncision and Drainage Date/Time: 09/11/2016 4:36 PM Performed by: Robyn Haber Authorized by: Robyn Haber   Consent:    Consent obtained:  Verbal   Consent given by:  Patient   Risks discussed:  Incomplete drainage   Alternatives discussed:  No treatment Location:    Type:  Abscess   Location:  Trunk   Trunk location:  Back Pre-procedure details:    Skin preparation:  Antiseptic wash Anesthesia (see MAR for exact dosages):    Anesthesia method:  Local infiltration   Local anesthetic:  Lidocaine 1% WITH epi Procedure type:    Complexity:  Simple Procedure details:    Needle aspiration: no     Incision types:  Stab incision   Incision depth:  Subcutaneous   Scalpel blade:  11   Wound management:  Probed and deloculated   Drainage:  Bloody and purulent   Drainage amount:  Moderate   Wound treatment:  Wound left open   Packing materials:  None Post-procedure details:    Patient tolerance of procedure:  Tolerated well, no immediate complications   (including critical care time)  Medications Ordered in UC Medications - No data to display   Initial Impression / Assessment and Plan / UC Course  I have reviewed the triage vital signs and the nursing notes.  Pertinent labs & imaging results that were available during my care of the patient were reviewed by me and considered in my medical decision making (see chart for details).     Final  Clinical Impressions(s) / UC Diagnoses   Final diagnoses:  Abscess     New Prescriptions New Prescriptions   AMOXICILLIN-CLAVULANATE (AUGMENTIN) 875-125 MG TABLET    Take 1 tablet by mouth every 12 (twelve) hours.     Robyn Haber, MD 09/11/16 [phone]

## 2016-09-14 NOTE — Telephone Encounter (Signed)
I spoke with the Prior Auth department. The Lantus will require a PA but even if the PA is approved the cost for 1 vial would be $ 121. There are cheaper alternatives such as Tresiba Pen, $ 20 a month, Basaglar Pen $ 20 a month and Levemir vial $ 20 dollars a month.   The Nancee Liter is the most similar to Lantus.   Please advise.

## 2016-09-24 ENCOUNTER — Other Ambulatory Visit: Payer: Self-pay

## 2016-09-24 MED ORDER — LOSARTAN POTASSIUM 25 MG PO TABS: 25.0000 mg | ORAL_TABLET | Freq: Every morning | ORAL | 0 refills | Status: DC

## 2016-10-21 DIAGNOSIS — H26492 Other secondary cataract, left eye: Secondary | ICD-10-CM | POA: Diagnosis not present

## 2016-10-21 DIAGNOSIS — E119 Type 2 diabetes mellitus without complications: Secondary | ICD-10-CM | POA: Diagnosis not present

## 2016-10-21 DIAGNOSIS — H524 Presbyopia: Secondary | ICD-10-CM | POA: Diagnosis not present

## 2016-10-21 DIAGNOSIS — D3132 Benign neoplasm of left choroid: Secondary | ICD-10-CM | POA: Diagnosis not present

## 2016-10-28 DIAGNOSIS — M65341 Trigger finger, right ring finger: Secondary | ICD-10-CM | POA: Diagnosis not present

## 2016-11-16 DIAGNOSIS — N8189 Other female genital prolapse: Secondary | ICD-10-CM | POA: Diagnosis not present

## 2016-11-30 ENCOUNTER — Encounter: Payer: Self-pay | Admitting: Cardiology

## 2016-12-01 ENCOUNTER — Encounter: Payer: Self-pay | Admitting: Adult Health

## 2016-12-01 ENCOUNTER — Telehealth: Payer: Self-pay | Admitting: Adult Health

## 2016-12-01 ENCOUNTER — Ambulatory Visit (INDEPENDENT_AMBULATORY_CARE_PROVIDER_SITE_OTHER): Payer: Medicare Other | Admitting: Adult Health

## 2016-12-01 VITALS — BP 153/74 | HR 83 | Temp 97.7°F | Ht 65.0 in | Wt 150.0 lb

## 2016-12-01 DIAGNOSIS — E119 Type 2 diabetes mellitus without complications: Secondary | ICD-10-CM | POA: Diagnosis not present

## 2016-12-01 DIAGNOSIS — I10 Essential (primary) hypertension: Secondary | ICD-10-CM

## 2016-12-01 DIAGNOSIS — E1169 Type 2 diabetes mellitus with other specified complication: Secondary | ICD-10-CM | POA: Diagnosis not present

## 2016-12-01 DIAGNOSIS — Z794 Long term (current) use of insulin: Secondary | ICD-10-CM | POA: Diagnosis not present

## 2016-12-01 LAB — POCT GLYCOSYLATED HEMOGLOBIN (HGB A1C): Hemoglobin A1C: 7.5

## 2016-12-01 MED ORDER — LEVOTHYROXINE SODIUM 150 MCG PO TABS: 150.0000 ug | ORAL_TABLET | Freq: Every day | ORAL | 3 refills | Status: DC

## 2016-12-01 MED ORDER — METFORMIN HCL ER (MOD) 1000 MG PO TB24: 2000.0000 mg | ORAL_TABLET | Freq: Two times a day (BID) | ORAL | 1 refills | Status: DC

## 2016-12-01 MED ORDER — METFORMIN HCL 500 MG PO TABS: 500.0000 mg | ORAL_TABLET | Freq: Four times a day (QID) | ORAL | 2 refills | Status: DC

## 2016-12-01 NOTE — Telephone Encounter (Signed)
LVM for pt to return call ASAP.  Charyl Bigger, CMA

## 2016-12-01 NOTE — Progress Notes (Signed)
Subjective:    Patient ID: Alicia Medina, female    DOB: 02/15/1933, 83 y.o.   MRN: [id number]  HPI:  Alicia Medina is here for f/u: T2D, HTN.  She has been compliant on all medication and denies SE.  She denies acute sx's and is closely followed by cards (MI 2013, treated with stent).  Overall she reports felling quite well, and is caring for her husband receiving tx for lymphoma (he is experiencing poor appetite, fatigue, and incontinence of bowels). She believes that she has been on long-acting injectable insulin for at least 2 years and states "I don't know why I am really on it, my numbers have been good". She reports am BS ranging 100-130s consistently.  Patient Care Team    Relationship Specialty Notifications Start End  Mellody Dance, DO PCP - General Family Medicine  06/12/16   Martinique, Peter M, MD Consulting Physician Cardiology  07/22/16   Maisie Fus, MD Consulting Physician Obstetrics and Gynecology  41/28/78   Leighton Ruff, MD Consulting Physician General Surgery  07/22/16   Armandina Gemma, MD Consulting Physician General Surgery  07/22/16   Daryll Brod, [place] Physician Orthopedic Surgery  07/22/16     Patient Active Problem List   Diagnosis Date Noted  . Abscess 09/03/2016  . Hypothyroidism, postsurgical 07/22/2016  . HTN (hypertension) 09/27/2014  . Hemorrhoids, internal, with bleeding 05/08/2013  . Dyslipidemia 11/12/2011  . Coronary artery disease 11/12/2011  . Old inferior wall myocardial infarction 08/29/2011  . DM2 (diabetes mellitus, type 2) 08/29/2011     Past Medical History:  Diagnosis Date  . Arthritis   . CAD (coronary artery disease) CARDIOLOGIST-  DR Martinique   08-28-2011 Inferior STEMI with BMS to the RCA, complicated by cardiogenic shock, VF, VDRF , temporary CHB and abrupt reocclusion of the RCA stent treated with a second BMS to the RCA. Has residual LAD disease. EF is 50%  . Diverticulosis of colon   . Hemorrhoids, internal, with  bleeding   . Hyperlipidemia   . Hypertension   . Hypothyroidism, postsurgical   . S/p bare metal coronary artery stent    2013  . Type 2 diabetes mellitus ([place])   . Wears hearing aid    both ears     Past Surgical History:  Procedure Laterality Date  . CARDIOVASCULAR STRESS TEST  10-03-2011  dr Martinique   normal perfusion / no ischemia/ ef 77%  . CARPAL TUNNEL RELEASE Right 01/25/2013   Procedure: CARPAL TUNNEL RELEASE;  Surgeon: Wynonia Sours, MD;  Location: [place];  Service: Orthopedics;  Laterality: Right;  . CARPAL TUNNEL RELEASE Left 10/25/2013   Procedure: LEFT CARPAL TUNNEL RELEASE;  Surgeon: Wynonia Sours, MD;  Location: [place];  Service: Orthopedics;  Laterality: Left;  . CATARACT EXTRACTION W/ INTRAOCULAR LENS  IMPLANT, BILATERAL  2007  . COLONOSCOPY  04-27-2003  . CORONARY ANGIOGRAM Left 08/28/2011   Procedure: CORONARY ANGIOGRAM;  Surgeon: Peter M Martinique, MD;  Location: [facility] CATH LAB;  Service: Cardiovascular;  Laterality: Left;  . CORONARY ANGIOPLASTY WITH STENT PLACEMENT  08-28-2011  dr Martinique   BMS to [facility] with abrupt reocclusion treated with second BMS/  LAD ostial 90%, proximal50-60%, first diagonal 80-90%  . LEFT HEART CATHETERIZATION WITH CORONARY ANGIOGRAM N/A 08/28/2011   Procedure: LEFT HEART CATHETERIZATION WITH CORONARY ANGIOGRAM;  Surgeon: Peter M Martinique, MD;  Location: [facility] CATH LAB;  Service: Cardiovascular;  Laterality: N/A;  . PERCUTANEOUS CORONARY STENT INTERVENTION (PCI-S)  Left 08/28/2011   Procedure: PERCUTANEOUS CORONARY STENT INTERVENTION (PCI-S);  Surgeon: Peter M Martinique, MD;  Location: [facility] CATH LAB;  Service: Cardiovascular;  Laterality: Left;  . TOTAL THYROIDECTOMY Bilateral 05-02-2007   multinodular goiter  . TRANSANAL HEMORRHOIDAL DEARTERIALIZATION N/A 03/28/2014   Procedure: TRANSANAL HEMORRHOIDAL DEARTERIALIZATION;  Surgeon: Leighton Ruff, MD;  Location: [facility];  Service: General;  Laterality: N/A;  .  TRANSTHORACIC ECHOCARDIOGRAM  08-30-2011   mild LVH/ ef 62-83%/  grade I diastolic dysfunction  . TRIGGER FINGER RELEASE Right 01/25/2013   Procedure: RELEASE TRIGGER FINGER/A-1 PULLEY RIGHT INDEX FINGER;  Surgeon: Wynonia Sours, MD;  Location: [place];  Service: Orthopedics;  Laterality: Right;  . TRIGGER FINGER RELEASE Left 10/25/2013   Procedure: RELEASE A-1 PULLEY LEFT MIDDLE FINGER;  Surgeon: Wynonia Sours, MD;  Location: [place];  Service: Orthopedics;  Laterality: Left;  . TRIGGER FINGER RELEASE Left 12/04/2014   Procedure: RELEASE TRIGGER FINGER/A-1 PULLEY LEFT RING FINGER;  Surgeon: Daryll Brod, MD;  Location: [place];  Service: Orthopedics;  Laterality: Left;     Family History  Problem Relation Age of Onset  . Stroke Mother   . Cancer Mother     breast  . Cancer Father     lung  . Stroke Father      History  Drug Use No     History  Alcohol Use  . 0.6 oz/week  . 1 Glasses of wine per week    Comment: occ     History  Smoking Status  . Never Smoker  Smokeless Tobacco  . Never Used     Outpatient Encounter Prescriptions as of 12/01/2016  Medication Sig Note  . amoxicillin-clavulanate (AUGMENTIN) 875-125 MG tablet Take 1 tablet by mouth every 12 (twelve) hours.   Marland Kitchen aspirin 81 MG tablet Take 81 mg by mouth daily.   . calcium carbonate (CALCIUM 600) 1500 (600 Ca) MG TABS tablet Take 600 mg of elemental calcium by mouth daily with breakfast.   . Coenzyme Q10 200 MG TABS Take 1 tablet by mouth daily.   . Cyanocobalamin (B-12) 2500 MCG TABS Take 1 tablet by mouth daily.   Marland Kitchen levothyroxine (SYNTHROID, LEVOTHROID) 150 MCG tablet Take 1 tablet (150 mcg total) by mouth daily before breakfast.   . loperamide (IMODIUM) 2 MG capsule Take by mouth as needed for diarrhea or loose stools.   Marland Kitchen losartan (COZAAR) 25 MG tablet Take 1 tablet (25 mg total) by mouth every morning.   . metFORMIN (GLUMETZA) 1000 MG (MOD) 24 hr tablet  Take 2 tablets (2,000 mg total) by mouth 2 (two) times daily. One tablet with breakfast and dinner.   . metoprolol (LOPRESSOR) 50 MG tablet Take 1 tablet (50 mg total) by mouth 2 (two) times daily.   . Multiple Vitamin (MULTIVITAMIN) tablet Take 1 tablet by mouth daily.   . potassium chloride SA (K-DUR,KLOR-CON) 20 MEQ tablet TAKE 1 TABLET BY MOUTH DAILY.   . rosuvastatin (CRESTOR) 20 MG tablet Take 1 tablet (20 mg total) by mouth daily.   . S-Adenosylmethionine (SAM-E) 400 MG TABS Take 1 tablet by mouth daily.   Angelia Mould TEST test strip  11/20/2015: Received from: External Pharmacy  . vitamin C (ASCORBIC ACID) 500 MG tablet Take 500 mg by mouth daily.   Marland Kitchen VITAMIN E PO Take 500 mg by mouth daily.   . [DISCONTINUED] insulin glargine (LANTUS) 100 UNIT/ML injection Inject 15 Units into the skin at bedtime.   . [  DISCONTINUED] levothyroxine (SYNTHROID, LEVOTHROID) 150 MCG tablet Take 150 mcg by mouth daily before breakfast.   . [DISCONTINUED] metFORMIN (GLUCOPHAGE XR) 500 MG 24 hr tablet Take 3 tablets (1,500 mg total) by mouth daily with breakfast. Two tablets with breakfast and One tablet with dinner    No facility-administered encounter medications on file as of 12/01/2016.     Allergies: Demeclocycline; Lisinopril; and Tetracyclines & related  Body mass index is 24.96 kg/m.  Blood pressure (!) 153/74, pulse 83, temperature 97.7 F (36.5 C), height 5\' 5"  (1.651 m), weight 150 lb (68 kg).    Review of Systems  Constitutional: Negative for activity change, appetite change, chills, diaphoresis, fatigue, fever and unexpected weight change.  Eyes: Negative for visual disturbance.  Respiratory: Negative for cough, chest tightness, shortness of breath, wheezing and stridor.   Cardiovascular: Negative for chest pain, palpitations and leg swelling.  Gastrointestinal: Negative for abdominal distention, abdominal pain, blood in stool, constipation, diarrhea, nausea and vomiting.  Endocrine:  Negative for cold intolerance, heat intolerance, polyphagia and polyuria.  Genitourinary: Negative for flank pain.  Skin: Negative for color change, pallor, rash and wound.  Allergic/Immunologic: Negative for immunocompromised state.  Neurological: Negative for dizziness, weakness, light-headedness and headaches.  Hematological: Does not bruise/bleed easily.       Objective:   Physical Exam  Constitutional: She is oriented to person, place, and time. She appears well-developed and well-nourished. No distress.  HENT:  Head: Normocephalic and atraumatic.  Eyes: Conjunctivae are normal. Pupils are equal, round, and reactive to light.  Neck: Normal range of motion. Neck supple.  Cardiovascular: Normal rate, regular rhythm, normal heart sounds and intact distal pulses.   No murmur heard. Pulmonary/Chest: Effort normal and breath sounds normal. No respiratory distress. She has no wheezes. She has no rales. She exhibits no tenderness.  Lymphadenopathy:    She has no cervical adenopathy.  Neurological: She is alert and oriented to person, place, and time.  Skin: Skin is warm and dry. No rash noted. She is not diaphoretic. No erythema. No pallor.  Psychiatric: She has a normal mood and affect. Her behavior is normal. Judgment and thought content normal.  Nursing note and vitals reviewed.         Assessment & Plan:   1. Diabetes mellitus without complication ([place])   2. Type 2 diabetes mellitus with other specified complication, with long-term current use of insulin ([place])   3. Essential hypertension     DM2 (diabetes mellitus, type 2) A1c today 7.5 Last several A1cs have been stable in the mid 7s. No hx of renal disease, last creat 06/04/16 was 0.8 Will trial off insulin glargine. Increased Metformin to 1000mg  with breakfast and 1000mg  with dinner. Discussed in detail medication changes, verbalized understanding/agreement. F/u in 3 months-T2D, HTN.  HTN (hypertension) Continue  Losartan 25mg  and Toprol 50mg  daily.    FOLLOW-UP:  Return in about 3 months (around 03/03/2017) for Regular Follow Up, Diabetes, HTN, CAD.

## 2016-12-01 NOTE — Telephone Encounter (Signed)
Afternoon Alicia Medina, Can you please call Alicia Medina- D/c'd Lantus Metformin 500mg  x 2 tablets at breakfast and dinner-total of 2000mg  daily. Please return in 3 months for A1c re-check, sooner if needed. Thanks, [employer]

## 2016-12-01 NOTE — Assessment & Plan Note (Signed)
A1c today 7.5 Last several A1cs have been stable in the mid 7s. No hx of renal disease, last creat 06/04/16 was 0.8 Will trial off insulin glargine. Increased Metformin to 1000mg  with breakfast and 1000mg  with dinner. Discussed in detail medication changes, verbalized understanding/agreement. F/u in 3 months-T2D, HTN.

## 2016-12-01 NOTE — Telephone Encounter (Signed)
Hi Alicia Medina  Patient says she is sorry that she missed (3) calls from you but she hasn't been home.Marland Kitchen Pls call her as soon as you get this message she is at home now. --glh

## 2016-12-01 NOTE — Assessment & Plan Note (Signed)
Continue Losartan 25mg  and Toprol 50mg  daily.

## 2016-12-01 NOTE — Patient Instructions (Signed)
Carbohydrate Counting for Diabetes Mellitus, Adult Carbohydrate counting is a method for keeping track of how many carbohydrates you eat. Eating carbohydrates naturally increases the amount of sugar (glucose) in the blood. Counting how many carbohydrates you eat helps keep your blood glucose within normal limits, which helps you manage your diabetes (diabetes mellitus). It is important to know how many carbohydrates you can safely have in each meal. This is different for every person. A diet and nutrition specialist (registered dietitian) can help you make a meal plan and calculate how many carbohydrates you should have at each meal and snack. Carbohydrates are found in the following foods:  Grains, such as breads and cereals.  Dried beans and soy products.  Starchy vegetables, such as potatoes, peas, and corn.  Fruit and fruit juices.  Milk and yogurt.  Sweets and snack foods, such as cake, cookies, candy, chips, and soft drinks. How do I count carbohydrates? There are two ways to count carbohydrates in food. You can use either of the methods or a combination of both. Reading "Nutrition Facts" on packaged food  The "Nutrition Facts" list is included on the labels of almost all packaged foods and beverages in the [place] It includes:  The serving size.  Information about nutrients in each serving, including the grams (g) of carbohydrate per serving. To use the "Nutrition Facts":  Decide how many servings you will have.  Multiply the number of servings by the number of carbohydrates per serving.  The resulting number is the total amount of carbohydrates that you will be having. Learning standard serving sizes of other foods  When you eat foods containing carbohydrates that are not packaged or do not include "Nutrition Facts" on the label, you need to measure the servings in order to count the amount of carbohydrates:  Measure the foods that you will eat with a food scale or measuring  cup, if needed.  Decide how many standard-size servings you will eat.  Multiply the number of servings by 15. Most carbohydrate-rich foods have about 15 g of carbohydrates per serving.  For example, if you eat 8 oz (170 g) of strawberries, you will have eaten 2 servings and 30 g of carbohydrates (2 servings x 15 g = 30 g).  For foods that have more than one food mixed, such as soups and casseroles, you must count the carbohydrates in each food that is included. The following list contains standard serving sizes of common carbohydrate-rich foods. Each of these servings has about 15 g of carbohydrates:   hamburger bun or  English muffin.   oz (15 mL) syrup.   oz (14 g) jelly.  1 slice of bread.  1 six-inch tortilla.  3 oz (85 g) cooked rice or pasta.  4 oz (113 g) cooked dried beans.  4 oz (113 g) starchy vegetable, such as peas, corn, or potatoes.  4 oz (113 g) hot cereal.  4 oz (113 g) mashed potatoes or  of a large baked potato.  4 oz (113 g) canned or frozen fruit.  4 oz (120 mL) fruit juice.  4-6 crackers.  6 chicken nuggets.  6 oz (170 g) unsweetened dry cereal.  6 oz (170 g) plain fat-free yogurt or yogurt sweetened with artificial sweeteners.  8 oz (240 mL) milk.  8 oz (170 g) fresh fruit or one small piece of fruit.  24 oz (680 g) popped popcorn. Example of carbohydrate counting Sample meal   3 oz (85 g) chicken breast.  6  oz (170 g) brown rice.  4 oz (113 g) corn.  8 oz (240 mL) milk.  8 oz (170 g) strawberries with sugar-free whipped topping. Carbohydrate calculation  1. Identify the foods that contain carbohydrates:  Rice.  Corn.  Milk.  Strawberries. 2. Calculate how many servings you have of each food:  2 servings rice.  1 serving corn.  1 serving milk.  1 serving strawberries. 3. Multiply each number of servings by 15 g:  2 servings rice x 15 g = 30 g.  1 serving corn x 15 g = 15 g.  1 serving milk x 15 g = 15  g.  1 serving strawberries x 15 g = 15 g. 4. Add together all of the amounts to find the total grams of carbohydrates eaten:  30 g + 15 g + 15 g + 15 g = 75 g of carbohydrates total. This information is not intended to replace advice given to you by your health care provider. Make sure you discuss any questions you have with your health care provider. Document Released: 07/13/2005 Document Revised: 01/31/2016 Document Reviewed: 12/25/2015 Elsevier Interactive Patient Education  2017 [employer] Hypertension Hypertension, commonly called high blood pressure, is when the force of blood pumping through the arteries is too strong. The arteries are the blood vessels that carry blood from the heart throughout the body. Hypertension forces the heart to work harder to pump blood and may cause arteries to become narrow or stiff. Having untreated or uncontrolled hypertension can cause heart attacks, strokes, kidney disease, and other problems. A blood pressure reading consists of a higher number over a lower number. Ideally, your blood pressure should be below 120/80. The first ("top") number is called the systolic pressure. It is a measure of the pressure in your arteries as your heart beats. The second ("bottom") number is called the diastolic pressure. It is a measure of the pressure in your arteries as the heart relaxes. What are the causes? The cause of this condition is not known. What increases the risk? Some risk factors for high blood pressure are under your control. Others are not. Factors you can change   Smoking.  Having type 2 diabetes mellitus, high cholesterol, or both.  Not getting enough exercise or physical activity.  Being overweight.  Having too much fat, sugar, calories, or salt (sodium) in your diet.  Drinking too much alcohol. Factors that are difficult or impossible to change   Having chronic kidney disease.  Having a family history of high blood pressure.  Age.  Risk increases with age.  Race. You may be at higher risk if you are African-American.  Gender. Men are at higher risk than women before age 63. After age 73, women are at higher risk than men.  Having obstructive sleep apnea.  Stress. What are the signs or symptoms? Extremely high blood pressure (hypertensive crisis) may cause:  Headache.  Anxiety.  Shortness of breath.  Nosebleed.  Nausea and vomiting.  Severe chest pain.  Jerky movements you cannot control (seizures). How is this diagnosed? This condition is diagnosed by measuring your blood pressure while you are seated, with your arm resting on a surface. The cuff of the blood pressure monitor will be placed directly against the skin of your upper arm at the level of your heart. It should be measured at least twice using the same arm. Certain conditions can cause a difference in blood pressure between your right and left arms. Certain factors can cause  blood pressure readings to be lower or higher than normal (elevated) for a short period of time:  When your blood pressure is higher when you are in a health care provider's office than when you are at home, this is called white coat hypertension. Most people with this condition do not need medicines.  When your blood pressure is higher at home than when you are in a health care provider's office, this is called masked hypertension. Most people with this condition may need medicines to control blood pressure. If you have a high blood pressure reading during one visit or you have normal blood pressure with other risk factors:  You may be asked to return on a different day to have your blood pressure checked again.  You may be asked to monitor your blood pressure at home for 1 week or longer. If you are diagnosed with hypertension, you may have other blood or imaging tests to help your health care provider understand your overall risk for other conditions. How is this  treated? This condition is treated by making healthy lifestyle changes, such as eating healthy foods, exercising more, and reducing your alcohol intake. Your health care provider may prescribe medicine if lifestyle changes are not enough to get your blood pressure under control, and if:  Your systolic blood pressure is above 130.  Your diastolic blood pressure is above 80. Your personal target blood pressure may vary depending on your medical conditions, your age, and other factors. Follow these instructions at home: Eating and drinking   Eat a diet that is high in fiber and potassium, and low in sodium, added sugar, and fat. An example eating plan is called the DASH (Dietary Approaches to Stop Hypertension) diet. To eat this way:  Eat plenty of fresh fruits and vegetables. Try to fill half of your plate at each meal with fruits and vegetables.  Eat whole grains, such as whole wheat pasta, brown rice, or whole grain bread. Fill about one quarter of your plate with whole grains.  Eat or drink low-fat dairy products, such as skim milk or low-fat yogurt.  Avoid fatty cuts of meat, processed or cured meats, and poultry with skin. Fill about one quarter of your plate with lean proteins, such as fish, chicken without skin, beans, eggs, and tofu.  Avoid premade and processed foods. These tend to be higher in sodium, added sugar, and fat.  Reduce your daily sodium intake. Most people with hypertension should eat less than 1,500 mg of sodium a day.  Limit alcohol intake to no more than 1 drink a day for nonpregnant women and 2 drinks a day for men. One drink equals 12 oz of beer, 5 oz of wine, or 1 oz of hard liquor. Lifestyle   Work with your health care provider to maintain a healthy body weight or to lose weight. Ask what an ideal weight is for you.  Get at least 30 minutes of exercise that causes your heart to beat faster (aerobic exercise) most days of the week. Activities may include  walking, swimming, or biking.  Include exercise to strengthen your muscles (resistance exercise), such as pilates or lifting weights, as part of your weekly exercise routine. Try to do these types of exercises for 30 minutes at least 3 days a week.  Do not use any products that contain nicotine or tobacco, such as cigarettes and e-cigarettes. If you need help quitting, ask your health care provider.  Monitor your blood pressure at home as  told by your health care provider.  Keep all follow-up visits as told by your health care provider. This is important. Medicines   Take over-the-counter and prescription medicines only as told by your health care provider. Follow directions carefully. Blood pressure medicines must be taken as prescribed.  Do not skip doses of blood pressure medicine. Doing this puts you at risk for problems and can make the medicine less effective.  Ask your health care provider about side effects or reactions to medicines that you should watch for. Contact a health care provider if:  You think you are having a reaction to a medicine you are taking.  You have headaches that keep coming back (recurring).  You feel dizzy.  You have swelling in your ankles.  You have trouble with your vision. Get help right away if:  You develop a severe headache or confusion.  You have unusual weakness or numbness.  You feel faint.  You have severe pain in your chest or abdomen.  You vomit repeatedly.  You have trouble breathing. Summary  Hypertension is when the force of blood pumping through your arteries is too strong. If this condition is not controlled, it may put you at risk for serious complications.  Your personal target blood pressure may vary depending on your medical conditions, your age, and other factors. For most people, a normal blood pressure is less than 120/80.  Hypertension is treated with lifestyle changes, medicines, or a combination of both.  Lifestyle changes include weight loss, eating a healthy, low-sodium diet, exercising more, and limiting alcohol. This information is not intended to replace advice given to you by your health care provider. Make sure you discuss any questions you have with your health care provider. Document Released: 07/13/2005 Document Revised: 06/10/2016 Document Reviewed: 06/10/2016 Elsevier Interactive Patient Education  2017 [place] all medications as directed. Will trial off of long-acting insulin since A1c was 7.5 today. Please follow diabetic diet. Please follow-up in 3 months, sooner if needed.

## 2016-12-02 NOTE — Telephone Encounter (Signed)
Pt informed.  Pt expressed understanding and is agreeable.  T. Nelson, CMA  

## 2016-12-09 DIAGNOSIS — M65341 Trigger finger, right ring finger: Secondary | ICD-10-CM | POA: Diagnosis not present

## 2016-12-10 ENCOUNTER — Other Ambulatory Visit: Payer: Self-pay | Admitting: Cardiology

## 2016-12-10 ENCOUNTER — Telehealth: Payer: Self-pay | Admitting: Cardiology

## 2016-12-10 DIAGNOSIS — E119 Type 2 diabetes mellitus without complications: Secondary | ICD-10-CM | POA: Diagnosis not present

## 2016-12-10 DIAGNOSIS — I2510 Atherosclerotic heart disease of native coronary artery without angina pectoris: Secondary | ICD-10-CM

## 2016-12-10 DIAGNOSIS — I10 Essential (primary) hypertension: Secondary | ICD-10-CM

## 2016-12-10 DIAGNOSIS — E1169 Type 2 diabetes mellitus with other specified complication: Secondary | ICD-10-CM

## 2016-12-10 DIAGNOSIS — Z794 Long term (current) use of insulin: Secondary | ICD-10-CM | POA: Diagnosis not present

## 2016-12-10 DIAGNOSIS — E785 Hyperlipidemia, unspecified: Secondary | ICD-10-CM | POA: Diagnosis not present

## 2016-12-10 LAB — HEPATIC FUNCTION PANEL
ALBUMIN: 4.1 g/dL (ref 3.6–5.1)
ALT: 14 U/L (ref 6–29)
AST: 15 U/L (ref 10–35)
Alkaline Phosphatase: 38 U/L (ref 33–130)
BILIRUBIN DIRECT: 0.1 mg/dL (ref <=0.2)
Indirect Bilirubin: 0.3 mg/dL (ref 0.2–1.2)
Total Bilirubin: 0.4 mg/dL (ref 0.2–1.2)
Total Protein: 7.1 g/dL (ref 6.1–8.1)

## 2016-12-10 LAB — BASIC METABOLIC PANEL
BUN: 14 mg/dL (ref 7–25)
CO2: 27 mmol/L (ref 20–31)
Calcium: 9.5 mg/dL (ref 8.6–10.4)
Chloride: 105 mmol/L (ref 98–110)
Creat: 0.82 mg/dL (ref 0.60–0.88)
GLUCOSE: 168 mg/dL — AB (ref 65–99)
POTASSIUM: 5.3 mmol/L (ref 3.5–5.3)
Sodium: 140 mmol/L (ref 135–146)

## 2016-12-10 LAB — LIPID PANEL
CHOL/HDL RATIO: 2.6 ratio (ref <5.0)
CHOLESTEROL: 120 mg/dL (ref <200)
HDL: 46 mg/dL — AB (ref >50)
LDL Cholesterol: 48 mg/dL (ref <100)
Triglycerides: 131 mg/dL (ref <150)
VLDL: 26 mg/dL (ref <30)

## 2016-12-10 NOTE — Telephone Encounter (Signed)
New message    Pt wants to know if she is doing labs during her visit on the 22nd and if not she would like to have them done on the 22nd and is asking if this is ok

## 2016-12-10 NOTE — Telephone Encounter (Signed)
Returned call to patient.Spoke to husband he stated wife at dentist.Stated she wanted to have lab work this morning before she eats.Advised she can go to [place] lab I will put orders in.

## 2016-12-11 LAB — HEMOGLOBIN A1C
HEMOGLOBIN A1C: 7.8 % — AB (ref <5.7)
Mean Plasma Glucose: 177 mg/dL

## 2016-12-13 NOTE — Progress Notes (Signed)
Alicia Medina Date of Birth: 09/25/1932 Medical Record #[id number]  History of Present Illness: Alicia Medina is seen today for follow up CAD. She has had an inferior MI treated with BMS to the RCA in February 2013. She required defibrillation multiple times and her hospitalization was complicated by cardiogenic shock, VF, VDRF and CHB requiring a temporary pacemaker. She had abrupt occlusion of the RCA stent that was treated with a 2nd BMS to the RCA.Followup nuclear stress testing in March 2013 showed normal perfusion throughout with ejection fraction of 76%.  On followup today she denies any cardiac complaints of chest pain, SOB, or palpitations. She hasn't been exercising as much. Her husband has been diagnosed with lymphoma and has been undergoing chemo and radiation Rx. She is planning on traveling to [place] next week to visit family. She notes occasional discomfort in left side relieved with BM. Is planning to see GI about this.  Current Outpatient Prescriptions on File Prior to Visit  Medication Sig Dispense Refill  . aspirin 81 MG tablet Take 81 mg by mouth daily.    . calcium carbonate (CALCIUM 600) 1500 (600 Ca) MG TABS tablet Take 600 mg of elemental calcium by mouth daily with breakfast.    . Coenzyme Q10 200 MG TABS Take 1 tablet by mouth daily.    . Cyanocobalamin (B-12) 2500 MCG TABS Take 1 tablet by mouth daily.    Marland Kitchen levothyroxine (SYNTHROID, LEVOTHROID) 150 MCG tablet Take 1 tablet (150 mcg total) by mouth daily before breakfast. 90 tablet 3  . loperamide (IMODIUM) 2 MG capsule Take by mouth as needed for diarrhea or loose stools.    Marland Kitchen losartan (COZAAR) 25 MG tablet Take 1 tablet (25 mg total) by mouth every morning. 90 tablet 0  . metFORMIN (GLUCOPHAGE) 500 MG tablet Take 1 tablet (500 mg total) by mouth 4 (four) times daily. Take two tablets with breakfast and dinner, total 2000mg  daily. 120 tablet 2  . metoprolol (LOPRESSOR) 50 MG tablet Take 1 tablet (50 mg total) by mouth 2  (two) times daily. 180 tablet 2  . Multiple Vitamin (MULTIVITAMIN) tablet Take 1 tablet by mouth daily.    . potassium chloride SA (K-DUR,KLOR-CON) 20 MEQ tablet TAKE 1 TABLET BY MOUTH DAILY. 90 tablet 2  . rosuvastatin (CRESTOR) 20 MG tablet Take 1 tablet (20 mg total) by mouth daily. 90 tablet 2  . S-Adenosylmethionine (SAM-E) 400 MG TABS Take 1 tablet by mouth daily.    Angelia Mould TEST test strip   21  . vitamin C (ASCORBIC ACID) 500 MG tablet Take 500 mg by mouth daily.    Marland Kitchen VITAMIN E PO Take 500 mg by mouth daily.     No current facility-administered medications on file prior to visit.     Allergies  Allergen Reactions  . Demeclocycline Other (See Comments)    Severe stomach cramps  . Lisinopril Cough  . Tetracyclines & Related Other (See Comments)    Severe stomach cramps    Past Medical History:  Diagnosis Date  . Arthritis   . CAD (coronary artery disease) CARDIOLOGIST-  DR Martinique   08-28-2011 Inferior STEMI with BMS to the RCA, complicated by cardiogenic shock, VF, VDRF , temporary CHB and abrupt reocclusion of the RCA stent treated with a second BMS to the RCA. Has residual LAD disease. EF is 50%  . Diverticulosis of colon   . Hemorrhoids, internal, with bleeding   . Hyperlipidemia   . Hypertension   . Hypothyroidism,  postsurgical   . S/p bare metal coronary artery stent    2013  . Type 2 diabetes mellitus ([place])   . Wears hearing aid    both ears    Past Surgical History:  Procedure Laterality Date  . CARDIOVASCULAR STRESS TEST  10-03-2011  dr Martinique   normal perfusion / no ischemia/ ef 77%  . CARPAL TUNNEL RELEASE Right 01/25/2013   Procedure: CARPAL TUNNEL RELEASE;  Surgeon: Wynonia Sours, MD;  Location: [place];  Service: Orthopedics;  Laterality: Right;  . CARPAL TUNNEL RELEASE Left 10/25/2013   Procedure: LEFT CARPAL TUNNEL RELEASE;  Surgeon: Wynonia Sours, MD;  Location: [place];  Service: Orthopedics;  Laterality: Left;  .  CATARACT EXTRACTION W/ INTRAOCULAR LENS  IMPLANT, BILATERAL  2007  . COLONOSCOPY  04-27-2003  . CORONARY ANGIOGRAM Left 08/28/2011   Procedure: CORONARY ANGIOGRAM;  Surgeon: Peter M Martinique, MD;  Location: [facility] CATH LAB;  Service: Cardiovascular;  Laterality: Left;  . CORONARY ANGIOPLASTY WITH STENT PLACEMENT  08-28-2011  dr Martinique   BMS to [facility] with abrupt reocclusion treated with second BMS/  LAD ostial 90%, proximal50-60%, first diagonal 80-90%  . LEFT HEART CATHETERIZATION WITH CORONARY ANGIOGRAM N/A 08/28/2011   Procedure: LEFT HEART CATHETERIZATION WITH CORONARY ANGIOGRAM;  Surgeon: Peter M Martinique, MD;  Location: [facility] CATH LAB;  Service: Cardiovascular;  Laterality: N/A;  . PERCUTANEOUS CORONARY STENT INTERVENTION (PCI-S) Left 08/28/2011   Procedure: PERCUTANEOUS CORONARY STENT INTERVENTION (PCI-S);  Surgeon: Peter M Martinique, MD;  Location: [facility] CATH LAB;  Service: Cardiovascular;  Laterality: Left;  . TOTAL THYROIDECTOMY Bilateral 05-02-2007   multinodular goiter  . TRANSANAL HEMORRHOIDAL DEARTERIALIZATION N/A 03/28/2014   Procedure: TRANSANAL HEMORRHOIDAL DEARTERIALIZATION;  Surgeon: Leighton Ruff, MD;  Location: [facility];  Service: General;  Laterality: N/A;  . TRANSTHORACIC ECHOCARDIOGRAM  08-30-2011   mild LVH/ ef 83-66%/  grade I diastolic dysfunction  . TRIGGER FINGER RELEASE Right 01/25/2013   Procedure: RELEASE TRIGGER FINGER/A-1 PULLEY RIGHT INDEX FINGER;  Surgeon: Wynonia Sours, MD;  Location: [place];  Service: Orthopedics;  Laterality: Right;  . TRIGGER FINGER RELEASE Left 10/25/2013   Procedure: RELEASE A-1 PULLEY LEFT MIDDLE FINGER;  Surgeon: Wynonia Sours, MD;  Location: [place];  Service: Orthopedics;  Laterality: Left;  . TRIGGER FINGER RELEASE Left 12/04/2014   Procedure: RELEASE TRIGGER FINGER/A-1 PULLEY LEFT RING FINGER;  Surgeon: Daryll Brod, MD;  Location: [place];  Service: Orthopedics;  Laterality: Left;    History   Smoking Status  . Never Smoker  Smokeless Tobacco  . Never Used    History  Alcohol Use  . 0.6 oz/week  . 1 Glasses of wine per week    Comment: occ    Family History  Problem Relation Age of Onset  . Stroke Mother   . Cancer Mother        breast  . Cancer Father        lung  . Stroke Father     Review of Systems: The review of systems is per the HPI.  All other systems were reviewed and are negative.  Physical Exam: BP 132/72   Pulse 77   Ht 5\' 5"  (1.651 m)   Wt 150 lb (68 kg)   BMI 24.96 kg/m  Patient is very pleasant and in no acute distress.  Skin is warm and dry. Color is normal.  HEENT is unremarkable. Normocephalic/atraumatic. PERRL. Sclera are nonicteric. Neck  is supple. No masses. No JVD. Lungs are clear. Cardiac exam shows a regular rate and rhythm. Normal S1-2. No gallop or murmur. Abdomen is soft. Extremities are without edema. Gait and ROM are intact. No gross neurologic deficits noted.  LABORATORY DATA: Lab Results  Component Value Date   WBC 6.8 06/04/2016   HGB 11.5 (L) 06/04/2016   HCT 35.7 06/04/2016   PLT 386 06/04/2016   GLUCOSE 168 (H) 12/10/2016   CHOL 120 12/10/2016   TRIG 131 12/10/2016   HDL 46 (L) 12/10/2016   LDLCALC 48 12/10/2016   ALT 14 12/10/2016   AST 15 12/10/2016   NA 140 12/10/2016   K 5.3 12/10/2016   CL 105 12/10/2016   CREATININE 0.82 12/10/2016   BUN 14 12/10/2016   CO2 27 12/10/2016   TSH 1.552 08/28/2011   INR 1.31 09/01/2011   HGBA1C 7.8 (H) 12/10/2016   MICROALBUR 30 09/03/2016   Ecg today shows NSR with old septal infarct. Low voltage. I have personally reviewed and interpreted this study.  Assessment / Plan: 1. Status post acute inferior myocardial infarction in February 2013 with multiple complications. Clinically she has done very well. Followup nuclear stress test March 2013 was normal.  Continue ASA. She is asymptomatic.  Follow up in 6 months.   2. Hyperlipidemia, excellent control on Crestor.  3.  Hypertension, well controlled.  4. Diabetes mellitus type 2  A1c 7.8%. Per primary care. Now off insulin and on higher dose of metformin.

## 2016-12-15 ENCOUNTER — Encounter: Payer: Self-pay | Admitting: Cardiology

## 2016-12-15 ENCOUNTER — Ambulatory Visit (INDEPENDENT_AMBULATORY_CARE_PROVIDER_SITE_OTHER): Payer: Medicare Other | Admitting: Cardiology

## 2016-12-15 ENCOUNTER — Other Ambulatory Visit: Payer: Self-pay

## 2016-12-15 VITALS — BP 132/72 | HR 77 | Ht 65.0 in | Wt 150.0 lb

## 2016-12-15 DIAGNOSIS — Z794 Long term (current) use of insulin: Secondary | ICD-10-CM | POA: Diagnosis not present

## 2016-12-15 DIAGNOSIS — I252 Old myocardial infarction: Secondary | ICD-10-CM

## 2016-12-15 DIAGNOSIS — E785 Hyperlipidemia, unspecified: Secondary | ICD-10-CM

## 2016-12-15 DIAGNOSIS — I2510 Atherosclerotic heart disease of native coronary artery without angina pectoris: Secondary | ICD-10-CM

## 2016-12-15 DIAGNOSIS — E1169 Type 2 diabetes mellitus with other specified complication: Secondary | ICD-10-CM

## 2016-12-15 DIAGNOSIS — I10 Essential (primary) hypertension: Secondary | ICD-10-CM

## 2016-12-15 NOTE — Addendum Note (Signed)
Addended by: Kathyrn Lass on: 12/15/2016 11:30 AM   Modules accepted: Orders

## 2016-12-15 NOTE — Patient Instructions (Signed)
Continue your current therapy  I will see you in 6 months.   

## 2017-01-18 ENCOUNTER — Other Ambulatory Visit: Payer: Self-pay | Admitting: Adult Health

## 2017-01-20 DIAGNOSIS — N8189 Other female genital prolapse: Secondary | ICD-10-CM | POA: Diagnosis not present

## 2017-02-08 DIAGNOSIS — R197 Diarrhea, unspecified: Secondary | ICD-10-CM | POA: Diagnosis not present

## 2017-02-08 DIAGNOSIS — F419 Anxiety disorder, unspecified: Secondary | ICD-10-CM | POA: Diagnosis not present

## 2017-02-08 DIAGNOSIS — K640 First degree hemorrhoids: Secondary | ICD-10-CM | POA: Diagnosis not present

## 2017-03-01 ENCOUNTER — Other Ambulatory Visit: Payer: Self-pay | Admitting: Obstetrics & Gynecology

## 2017-03-01 DIAGNOSIS — Z1231 Encounter for screening mammogram for malignant neoplasm of breast: Secondary | ICD-10-CM

## 2017-03-03 ENCOUNTER — Ambulatory Visit: Payer: Medicare Other | Admitting: Adult Health

## 2017-03-08 DIAGNOSIS — F419 Anxiety disorder, unspecified: Secondary | ICD-10-CM | POA: Diagnosis not present

## 2017-03-08 DIAGNOSIS — R197 Diarrhea, unspecified: Secondary | ICD-10-CM | POA: Diagnosis not present

## 2017-03-10 ENCOUNTER — Ambulatory Visit
Admission: RE | Admit: 2017-03-10 | Discharge: 2017-03-10 | Disposition: A | Discharge status: Home / Self Care | Payer: Medicare Other | Source: Ambulatory Visit | Attending: Obstetrics & Gynecology | Admitting: Obstetrics & Gynecology

## 2017-03-10 DIAGNOSIS — Z1231 Encounter for screening mammogram for malignant neoplasm of breast: Secondary | ICD-10-CM | POA: Diagnosis not present

## 2017-03-11 ENCOUNTER — Encounter: Payer: Self-pay | Admitting: Family Medicine

## 2017-03-11 ENCOUNTER — Ambulatory Visit: Payer: Medicare Other | Admitting: Family Medicine

## 2017-03-11 VITALS — BP 134/87 | HR 76 | Ht 65.0 in | Wt 152.9 lb

## 2017-03-11 DIAGNOSIS — E785 Hyperlipidemia, unspecified: Secondary | ICD-10-CM

## 2017-03-11 DIAGNOSIS — R5383 Other fatigue: Secondary | ICD-10-CM

## 2017-03-11 DIAGNOSIS — Z794 Long term (current) use of insulin: Secondary | ICD-10-CM

## 2017-03-11 DIAGNOSIS — E1169 Type 2 diabetes mellitus with other specified complication: Secondary | ICD-10-CM | POA: Diagnosis not present

## 2017-03-11 DIAGNOSIS — E890 Postprocedural hypothyroidism: Secondary | ICD-10-CM

## 2017-03-11 DIAGNOSIS — E0829 Diabetes mellitus due to underlying condition with other diabetic kidney complication: Secondary | ICD-10-CM

## 2017-03-11 DIAGNOSIS — Z8639 Personal history of other endocrine, nutritional and metabolic disease: Secondary | ICD-10-CM

## 2017-03-11 DIAGNOSIS — R809 Proteinuria, unspecified: Secondary | ICD-10-CM

## 2017-03-11 DIAGNOSIS — I252 Old myocardial infarction: Secondary | ICD-10-CM

## 2017-03-11 DIAGNOSIS — I10 Essential (primary) hypertension: Secondary | ICD-10-CM

## 2017-03-11 LAB — POCT GLYCOSYLATED HEMOGLOBIN (HGB A1C): HEMOGLOBIN A1C: 9.2

## 2017-03-11 NOTE — Progress Notes (Signed)
Impression and Recommendations:    1. Type 2 diabetes mellitus with other specified complication, with long-term current use of insulin (HCC)   2. Diabetes mellitus due to underlying condition with microalbuminuria, with long-term current use of insulin (HCC)   3. Dyslipidemia   4. Essential hypertension   5. Old inferior wall myocardial infarction   6. History of vitamin D deficiency   7. Hypothyroidism, postsurgical   8. Other fatigue    Patient will come in in the morning to check her thyroid in the near future.  His be a lab only visit. --  Patient recently had CMP, fasting lipid profile and other labs done through her other specialists.  - A1c today.   - Urine microbial and was 6 months ago.   - Asked patient to get [place] her diabetic eye exam report from her physician.  Alicia Medina Labrador continue patient on her insulin that she is on for now and continue to hold the metformin per her preference by her gastroenterologist.  She will call us in the near future and let us know which long-acting insulin and her insurance prefers.  Then we will refill the medicine and adjust dose as needed.   No problem-specific Assessment & Plan notes found for this encounter.   Education and routine counseling performed. Handouts provided.  New Prescriptions   No medications on file    Meds ordered this encounter  Medications   insulin glargine (LANTUS) 100 UNIT/ML injection    Sig: Inject into the skin at bedtime.    Modified Medications   No medications on file    Discontinued Medications   No medications on file     Orders Placed This Encounter  Procedures   Vitamin B12   VITAMIN D 25 Hydroxy (Vit-D Deficiency, Fractures)   TSH   T4, free   T3, free   CBC with Differential/Platelet   POCT glycosylated hemoglobin (Hb A1C)     Return in about 3 months (around 06/11/2017) for DM, hypothyroidism, BP.  The patient was counseled, risk factors were discussed, anticipatory guidance  given.  Gross side effects, risk and benefits, and alternatives of medications discussed with patient.  Patient is aware that all medications have potential side effects and we are unable to predict every side effect or drug-drug interaction that may occur.  Expresses verbal understanding and consents to current therapy plan and treatment regimen.  Please see AVS handed out to patient at the end of our visit for further patient instructions/ counseling done pertaining to today's office visit.    Note:  This document was prepared using Dragon voice recognition software and may include unintentional dictation errors.     Subjective:    Chief Complaint  Patient presents with   Follow-up     Alicia Medina is a 83 y.o. female who presents to [facility] today for Diabetes Management.    - Husband has been diagnosed with lymphoma and has been undergoing chemotherapy and radiation therapy.  Patient has been primary caretaker and focusing on him.  She recently came back from [place] where she visited family.  CV: Recently seen by Dr. Swaziland in May 2018.  Patient has a history of coronary artery disease and had an inferior wall MI treated with bare metal stent to her RCA in February 2013.  Had complicated postoperative hospitalization with cardiogenic shock requiring temporary pacemaker, when her stent reoccluded.  She underwent a second bare metal stent  to the RCA and had a follow-up nuclear stress test in March 2013 showing a normal EF of 76%.  She has no cardiac complaints today.  GI:  Dr Elnoria Howard- recently seen July 16th.     Problem  Diabetes Mellitus Due to Underlying Condition With Microalbuminuria, With Long-Term Current Use of Insulin (Hcc)   Urine microalbumin 6 months ago was in the 30-300 range.  A1c 3 months ago was 7.8.    We will recheck today.    History of Vitamin D Deficiency  Other Fatigue  DM2 (diabetes mellitus, type 2) (Resolved)     DM  HPI:  -->  Patient was seen by my colleague Orpha Bur in February 2018.  At that time she had no GI complaints metformin was increased from 1000 daily to 1500 daily.  Patient was seen 3 months later in May and again did not have any worsening of her GI symptoms so metformin was increased from 1500-1000 twice a day.  More recently patient followed up with her gastroenterologist Dr. Elnoria Howard whom patient had only seen Dr. Ewing Schlein prior and Dr. Elnoria Howard was wondering if the metformin could could be increasing her GI symptoms.  Patient denies that she felt it increased with the medicine.  So patient after being seen by GI went and stopped her metformin, she then started some leftover Lantus they Orpha Bur had stopped in May when she went up on the metformin.  Patient told me today she did not notice any change in her GI symptoms for better or worse when she was on the metformin versus without the metformin.  She tells me her diarrhea is just bad at times which comes and goes.   - Patient has been taking 15 units of Lantus at night.  Without any medicine her fasting sugars were in the 144-238 range.  Once she started the insulin back up fasting sugars have been running on average in the 130s to 140s.  She occasionally has a 180 or 190 dpending on what she eats the night before.   -  She has not been working on diet and exercise for diabetes  Pt is currently maintained on the following medications for diabetes:  Her GI doctor Dr. Elnoria Howard took her off the metformin due to diarrhea and GI side effects.  She is maintained on long-acting injectable insulin for at least 2 and half years now.  She reports blood sugar ranging from fasting 100 to 130s.  Medication compliance - poor   Denies polyuria/polydipsia.  Denies hypo/ hyperglycemia symptoms  - She denies new onset of: chest pain, exercise intolerance, shortness of breath, dizziness, visual changes, headache, lower extremity swelling or claudication.    Last diabetic eye exam  was No results found for: HMDIABEYEEXA  Foot exam- UTD  Last A1C in the office was:  Lab Results  Component Value Date   HGBA1C 7.8 (H) 12/10/2016   HGBA1C 7.5 12/01/2016   HGBA1C 7.7 09/03/2016    Lab Results  Component Value Date   MICROALBUR 30 09/03/2016   LDLCALC 48 12/10/2016   CREATININE 0.82 12/10/2016    Last 3 blood pressure readings in our office are as follows: BP Readings from Last 3 Encounters:  03/11/17 (!) 150/84  12/15/16 132/72  12/01/16 (!) 153/74    @BMILAST3 @    Patient Care Team    Relationship Specialty Notifications Start End  Thomasene Lot, DO PCP - General Family Medicine  06/12/16   Swaziland, Peter M, MD Consulting Physician Cardiology  07/22/16  Freddy Finner, MD Consulting Physician Obstetrics and Gynecology  07/22/16   Romie Levee, MD Consulting Physician General Surgery  07/22/16   Darnell Level, MD Consulting Physician General Surgery  07/22/16   Cindee Salt, MD Consulting Physician Orthopedic Surgery  07/22/16      Patient Active Problem List   Diagnosis Date Noted   Diabetes mellitus due to underlying condition with microalbuminuria, with long-term current use of insulin (HCC) 03/11/2017    Priority: High   HTN (hypertension) 09/27/2014    Priority: High   Dyslipidemia 11/12/2011    Priority: High   Old inferior wall myocardial infarction 08/29/2011    Priority: High   Hemorrhoids, internal, with bleeding 05/08/2013    Priority: Medium   History of vitamin D deficiency 03/11/2017   Other fatigue 03/11/2017   Abscess 09/03/2016   Hypothyroidism, postsurgical 07/22/2016   Coronary artery disease 11/12/2011     Past Medical History:  Diagnosis Date   Arthritis    CAD (coronary artery disease) CARDIOLOGIST-  DR [place]   08-28-2011 Inferior STEMI with BMS to the RCA, complicated by cardiogenic shock, VF, VDRF , temporary CHB and abrupt reocclusion of the RCA stent treated with a second BMS to the RCA. Has residual LAD  disease. EF is 50%   Diverticulosis of colon    Hemorrhoids, internal, with bleeding    Hyperlipidemia    Hypertension    Hypothyroidism, postsurgical    S/p bare metal coronary artery stent    2013   Type 2 diabetes mellitus (HCC)    Wears hearing aid    both ears     Past Surgical History:  Procedure Laterality Date   CARDIOVASCULAR STRESS TEST  10-03-2011  dr Swaz[place]   normal perfusion / no ischemia/ ef 77%   CARPAL TUNNEL RELEASE Right 01/25/2013   Procedure: CARPAL TUNNEL RELEASE;  Surgeon: Nicki Reaper, MD;  Location: [facility];  Service: Orthopedics;  Laterality: Right;   CARPAL TUNNEL RELEASE Left 10/25/2013   Procedure: LEFT CARPAL TUNNEL RELEASE;  Surgeon: Nicki Reaper, MD;  Location: [facility];  Service: Orthopedics;  Laterality: Left;   CATARACT EXTRACTION W/ INTRAOCULAR LENS  IMPLANT, BILATERAL  2007   COLONOSCOPY  04-27-2003   CORONARY ANGIOGRAM Left 08/28/2011   Procedure: CORONARY ANGIOGRAM;  Surgeon: Peter M Swaziland, MD;  Location: [facility] CATH LAB;  Service: Cardiovascular;  Laterality: Left;   CORONARY ANGIOPLASTY WITH STENT PLACEMENT  08-28-2011  dr Swaz[place]and   BMS to [facility] with abrupt reocclusion treated with second BMS/  LAD ostial 90%, proximal50-60%, first diagonal 80-90%   LEFT HEART CATHETERIZATION WITH CORONARY ANGIOGRAM N/A 08/28/2011   Procedure: LEFT HEART CATHETERIZATION WITH CORONARY ANGIOGRAM;  Surgeon: Peter M Swaziland, MD;  Location: [facility] CATH LAB;  Service: Cardiovascular;  Laterality: N/A;   PERCUTANEOUS CORONARY STENT INTERVENTION (PCI-S) Left 08/28/2011   Procedure: PERCUTANEOUS CORONARY STENT INTERVENTION (PCI-S);  Surgeon: Peter M Swaziland, MD;  Location: [facility] CATH LAB;  Service: Cardiovascular;  Laterality: Left;   TOTAL THYROIDECTOMY Bilateral 05-02-2007   multinodular goiter   TRANSANAL HEMORRHOIDAL DEARTERIALIZATION N/A 03/28/2014   Procedure: TRANSANAL HEMORRHOIDAL DEARTERIALIZATION;  Surgeon: Romie Levee, MD;  Location: [facility];  Service: General;  Laterality: N/A;   TRANSTHORACIC ECHOCARDIOGRAM  08-30-2011   mild LVH/ ef 50-55%/  grade I diastolic dysfunction   TRIGGER FINGER RELEASE Right 01/25/2013   Procedure: RELEASE TRIGGER FINGER/A-1 PULLEY RIGHT INDEX FINGER;  Surgeon: Nicki Reaper, MD;  Location: [facility];  Service: Orthopedics;  Laterality: Right;   TRIGGER FINGER RELEASE Left 10/25/2013   Procedure: RELEASE A-1 PULLEY LEFT MIDDLE FINGER;  Surgeon: Nicki Reaper, MD;  Location: [facility];  Service: Orthopedics;  Laterality: Left;   TRIGGER FINGER RELEASE Left 12/04/2014   Procedure: RELEASE TRIGGER FINGER/A-1 PULLEY LEFT RING FINGER;  Surgeon: Cindee Salt, MD;  Location: [facility];  Service: Orthopedics;  Laterality: Left;     Family History  Problem Relation Age of Onset   Stroke Mother    Cancer Mother        breast   Cancer Father        lung   Stroke Father      History  Drug Use No  ,  History  Alcohol Use   0.6 oz/week   1 Glasses of wine per week    Comment: occ  ,  History  Smoking Status   Never Smoker  Smokeless Tobacco   Never Used  ,    Current Outpatient Prescriptions on File Prior to Visit  Medication Sig Dispense Refill   aspirin 81 MG tablet Take 81 mg by mouth daily.     calcium carbonate (CALCIUM 600) 1500 (600 Ca) MG TABS tablet Take 600 mg of elemental calcium by mouth daily with breakfast.     Coenzyme Q10 200 MG TABS Take 1 tablet by mouth daily.     Cyanocobalamin (B-12) 2500 MCG TABS Take 1 tablet by mouth daily.     loperamide (IMODIUM) 2 MG capsule Take by mouth as needed for diarrhea or loose stools.     losartan (COZAAR) 25 MG tablet TAKE 1 TABLET (25 MG TOTAL) BY MOUTH EVERY MORNING. 90 tablet 0   metoprolol (LOPRESSOR) 50 MG tablet Take 1 tablet (50 mg total) by mouth 2 (two) times daily. 180 tablet 2   Multiple Vitamin (MULTIVITAMIN) tablet Take 1 tablet by mouth daily.     potassium chloride  SA (K-DUR,KLOR-CON) 20 MEQ tablet TAKE 1 TABLET BY MOUTH DAILY. 90 tablet 2   rosuvastatin (CRESTOR) 20 MG tablet Take 1 tablet (20 mg total) by mouth daily. 90 tablet 2   S-Adenosylmethionine (SAM-E) 400 MG TABS Take 1 tablet by mouth daily.     SYNTHROID 150 MCG tablet Take 1 tablet (150 mcg total) by mouth daily before breakfast. 30 tablet 6   TRUETRACK TEST test strip   21   vitamin C (ASCORBIC ACID) 500 MG tablet Take 500 mg by mouth daily.     VITAMIN E PO Take 500 mg by mouth daily.     metFORMIN (GLUCOPHAGE) 500 MG tablet Take 1 tablet (500 mg total) by mouth 4 (four) times daily. Take two tablets with breakfast and dinner, total 2000mg  daily. (Patient not taking: Reported on 03/11/2017) 120 tablet 2   No current facility-administered medications on file prior to visit.      Allergies  Allergen Reactions   Demeclocycline Other (See Comments)    Severe stomach cramps   Lisinopril Cough   Tetracyclines & Related Other (See Comments)    Severe stomach cramps     Review of Systems:   General:  Denies fever, chills Optho/Auditory:   Denies visual changes, blurred vision Respiratory:   Denies SOB, cough, wheeze, DIB  Cardiovascular:   Denies chest pain, palpitations, painful respirations Gastrointestinal:   Denies nausea, vomiting, diarrhea.  Endocrine:     Denies new hot or cold intolerance Musculoskeletal:  Denies joint swelling, gait  issues, or new unexplained myalgias/ arthralgias Skin:  Denies rash, suspicious lesions  Neurological:    Denies dizziness, unexplained weakness, numbness  Psychiatric/Behavioral:   Denies mood changes    Objective:     Blood pressure (!) 150/84, pulse 76, height 5\' 5"  (1.651 m), weight 152 lb 14.4 oz (69.4 kg).  Body mass index is 25.44 kg/m.  General: Well Developed, well nourished, and in no acute distress.  HEENT: Normocephalic, atraumatic, pupils equal round reactive to light, neck supple, No carotid bruits, no JVD Skin: Warm and  dry, cap RF less 2 sec Cardiac: Regular rate and rhythm, S1, S2 WNL's, no murmurs rubs or gallops Respiratory: ECTA B/L, Not using accessory muscles, speaking in full sentences. NeuroM-Sk: Ambulates w/o assistance, moves ext * 4 w/o difficulty, sensation grossly intact.  Ext: scant edema b/l lower ext Psych: No HI/SI, judgement and insight good, Euthymic mood. Full Affect.

## 2017-03-11 NOTE — Patient Instructions (Signed)
Please call us and let us know which type of long-acting insulin your insurance prefers.  Family can call you in a prescription to take the 15 units nightly.  Please call and let Melissa my assistantof this can be ordered for you.  In the near future come in first thing in the morning to get your thyroid and other labs done.  Then you can either follow-up with me to discuss the labs will be taking the review of call to discuss those results and if there is any need for changes in your medications.     Diabetes Mellitus and Standards of Medical Care  Managing diabetes (diabetes mellitus) can be complicated. Your diabetes treatment may be managed by a team of health care providers, including:  A diet and nutrition specialist (registered dietitian).  A nurse.  A certified diabetes educator (CDE).  A diabetes specialist (endocrinologist).  An eye doctor.  A primary care provider.  A dentist.  Your health care providers follow a schedule in order to help you get the best quality of care. The following schedule is a general guideline for your diabetes management plan. Your health care providers may also give you more specific instructions.  HbA1c (hemoglobin A1c) test This test provides information about blood sugar (glucose) control over the previous 2-3 months. It is used to check whether your diabetes management plan needs to be adjusted.  If you are meeting your treatment goals, this test is done at least 2 times a year.  If you are not meeting treatment goals or if your treatment goals have changed, this test is done 4 times a year.  Blood pressure test  This test is done at every routine medical visit. For most people, the goal is less than 130/80. Ask your health care provider what your goal blood pressure should be.  Dental and eye exams  Visit your dentist two times a year.  If you have type 1 diabetes, get an eye exam 3-5 years after you are diagnosed, and then once a  year after your first exam. ? If you were diagnosed with type 1 diabetes as a child, get an eye exam when you are age 70 or older and have had diabetes for 3-5 years. After the first exam, you should get an eye exam once a year.  If you have type 2 diabetes, have an eye exam as soon as you are diagnosed, and then once a year after your first exam.  Foot care exam  Visual foot exams are done at every routine medical visit. The exams check for cuts, bruises, redness, blisters, sores, or other problems with the feet.  A complete foot exam is done by your health care provider once a year. This exam includes an inspection of the structure and skin of your feet, and a check of the pulses and sensation in your feet. ? Type 1 diabetes: Get your first exam 3-5 years after diagnosis. ? Type 2 diabetes: Get your first exam as soon as you are diagnosed.  Check your feet every day for cuts, bruises, redness, blisters, or sores. If you have any of these or other problems that are not healing, contact your health care provider.  Kidney function test (urine microalbumin)  This test is done once a year. ? Type 1 diabetes: Get your first test 5 years after diagnosis. ? Type 2 diabetes: Get your first test as soon as you are diagnosed._  If you have chronic kidney disease (CKD), get a  serum creatinine and estimated glomerular filtration rate (eGFR) test once a year.  Lipid profile (cholesterol, HDL, LDL, triglycerides)  This test should be done when you are diagnosed with diabetes, and every 5 years after the first test. If you are on medicines to lower your cholesterol, you may need to get this test done every year. ? The goal for LDL is less than 100 mg/dL (5.5 mmol/L). If you are at high risk, the goal is less than 70 mg/dL (3.9 mmol/L). ? The goal for HDL is 40 mg/dL (2.2 mmol/L) for men and 50 mg/dL(2.8 mmol/L) for women. An HDL cholesterol of 60 mg/dL (3.3 mmol/L) or higher gives some protection  against heart disease. ? The goal for triglycerides is less than 150 mg/dL (8.3 mmol/L).  Immunizations  The yearly flu (influenza) vaccine is recommended for everyone 6 months or older who has diabetes.  The pneumonia (pneumococcal) vaccine is recommended for everyone 2 years or older who has diabetes. If you are 80 or older, you may get the pneumonia vaccine as a series of two separate shots.  The hepatitis B vaccine is recommended for adults shortly after they have been diagnosed with diabetes.  The Tdap (tetanus, diphtheria, and pertussis) vaccine should be given: ? According to normal childhood vaccination schedules, for children. ? Every 10 years, for adults who have diabetes.  The shingles vaccine is recommended for people who have had chicken pox and are 50 years or older.  Mental and emotional health  Screening for symptoms of eating disorders, anxiety, and depression is recommended at the time of diagnosis and afterward as needed. If your screening shows that you have symptoms (you have a positive screening result), you may need further evaluation and be referred to a mental health care provider.  Diabetes self-management education  Education about how to manage your diabetes is recommended at diagnosis and ongoing as needed.  Treatment plan  Your treatment plan will be reviewed at every medical visit.  Summary  Managing diabetes (diabetes mellitus) can be complicated. Your diabetes treatment may be managed by a team of health care providers.  Your health care providers follow a schedule in order to help you get the best quality of care.  Standards of care including having regular physical exams, blood tests, blood pressure monitoring, immunizations, screening tests, and education about how to manage your diabetes.  Your health care providers may also give you more specific instructions based on your individual health.    Blood Glucose Monitoring, Adult Monitoring  your blood sugar (glucose) helps you manage your diabetes. It also helps you and your health care provider determine how well your diabetes management plan is working. Blood glucose monitoring involves checking your blood glucose as often as directed, and keeping a record (log) of your results over time. Why should I monitor my blood glucose? Checking your blood glucose regularly can:  Help you understand how food, exercise, illnesses, and medicines affect your blood glucose.  Let you know what your blood glucose is at any time. You can quickly tell if you are having low blood glucose (hypoglycemia) or high blood glucose (hyperglycemia).  Help you and your health care provider adjust your medicines as needed.  When should I check my blood glucose? Follow instructions from your health care provider about how often to check your blood glucose.   This may depend on:  The type of diabetes you have.  How well-controlled your diabetes is.  Medicines you are taking.  If you  have type 1 diabetes:  Check your blood glucose at least 2 times a day.  Also check your blood glucose: ? Before every insulin injection. ? Before and after exercise. ? Between meals. ? 2 hours after a meal. ? Occasionally between 2:00 a.m. and 3:00 a.m., as directed. ? Before potentially dangerous tasks, like driving or using heavy machinery. ? At bedtime.  You may need to check your blood glucose more often, up to 6-10 times a day: ? If you use an insulin pump. ? If you need multiple daily injections (MDI). ? If your diabetes is not well-controlled. ? If you are ill. ? If you have a history of severe hypoglycemia. ? If you have a history of not knowing when your blood glucose is getting low (hypoglycemia unawareness).  If you have type 2 diabetes:  If you take insulin or other diabetes medicines, check your blood glucose at least 2 times a day.  If you are on intensive insulin therapy, check your blood  glucose at least 4 times a day. Occasionally, you may also need to check between 2:00 a.m. and 3:00 a.m., as directed.  Also check your blood glucose: ? Before and after exercise. ? Before potentially dangerous tasks, like driving or using heavy machinery.  You may need to check your blood glucose more often if: ? Your medicine is being adjusted. ? Your diabetes is not well-controlled. ? You are ill.  What is a blood glucose log?  A blood glucose log is a record of your blood glucose readings. It helps you and your health care provider: ? Look for patterns in your blood glucose over time. ? Adjust your diabetes management plan as needed.  Every time you check your blood glucose, write down your result and notes about things that may be affecting your blood glucose, such as your diet and exercise for the day.  Most glucose meters store a record of glucose readings in the meter. Some meters allow you to download your records to a computer. How do I check my blood glucose? Follow these steps to get accurate readings of your blood glucose: Supplies needed   Blood glucose meter.  Test strips for your meter. Each meter has its own strips. You must use the strips that come with your meter.  A needle to prick your finger (lancet). Do not use lancets more than once.  A device that holds the lancet (lancing device).  A journal or log book to write down your results.  Procedure  Wash your hands with soap and water.  Prick the side of your finger (not the tip) with the lancet. Use a different finger each time.  Gently rub the finger until a small drop of blood appears.  Follow instructions that come with your meter for inserting the test strip, applying blood to the strip, and using your blood glucose meter.  Write down your result and any notes.  Alternative testing sites  Some meters allow you to use areas of your body other than your finger (alternative sites) to test your  blood.  If you think you may have hypoglycemia, or if you have hypoglycemia unawareness, do not use alternative sites. Use your finger instead.  Alternative sites may not be as accurate as the fingers, because blood flow is slower in these areas. This means that the result you get may be delayed, and it may be different from the result that you would get from your finger.  The most common alternative  sites are: ? Forearm. ? Thigh. ? Palm of the hand.  Additional tips  Always keep your supplies with you.  If you have questions or need help, all blood glucose meters have a 24-hour "hotline" number that you can call. You may also contact your health care provider.  After you use a few boxes of test strips, adjust (calibrate) your blood glucose meter by following instructions that came with your meter.  The American Diabetes Association suggests the following targets for most nonpregnant adults with diabetes.  More or less stringent glycemic goals may be appropriate for each individual.  A1C: Less than 7% A1C may also be reported as eAG: Less than 154 mg/dl Before a meal (preprandial plasma glucose): 80-130 mg/dl 1-2 hours after beginning of the meal (Postprandial plasma glucose)*: Less than 180 mg/dl  *Postprandial glucose may be targeted if A1C goals are not met despite reaching preprandial glucose goals.   This information is not intended to replace advice given to you by your health care provider. Make sure you discuss any questions you have with your health care provider. Document Released: 07/16/2003 Document Revised: 01/31/2016 Document Reviewed: 12/23/2015 Elsevier Interactive Patient Education  2017 [employer].        Diabetes Mellitus and Food It is important for you to manage your blood sugar (glucose) level. Your blood glucose level can be greatly affected by what you eat. Eating healthier foods in the appropriate amounts throughout the day at about the same time  each day will help you control your blood glucose level. It can also help slow or prevent worsening of your diabetes mellitus. Healthy eating may even help you improve the level of your blood pressure and reach or maintain a healthy weight. General recommendations for healthful eating and cooking habits include:  Eating meals and snacks regularly. Avoid going long periods of time without eating to lose weight.  Eating a diet that consists mainly of plant-based foods, such as fruits, vegetables, nuts, legumes, and whole grains.  Using low-heat cooking methods, such as baking, instead of high-heat cooking methods, such as deep frying.  Work with your dietitian to make sure you understand how to use the Nutrition Facts information on food labels. How can food affect me? Carbohydrates Carbohydrates affect your blood glucose level more than any other type of food. Your dietitian will help you determine how many carbohydrates to eat at each meal and teach you how to count carbohydrates. Counting carbohydrates is important to keep your blood glucose at a healthy level, especially if you are using insulin or taking certain medicines for diabetes mellitus. Alcohol Alcohol can cause sudden decreases in blood glucose (hypoglycemia), especially if you use insulin or take certain medicines for diabetes mellitus. Hypoglycemia can be a life-threatening condition. Symptoms of hypoglycemia (sleepiness, dizziness, and disorientation) are similar to symptoms of having too much alcohol. If your health care provider has given you approval to drink alcohol, do so in moderation and use the following guidelines:  Women should not have more than one drink per day, and men should not have more than two drinks per day. One drink is equal to: ? 12 oz of beer. ? 5 oz of wine. ? 1 oz of hard liquor.  Do not drink on an empty stomach.  Keep yourself hydrated. Have water, diet soda, or unsweetened iced tea.  Regular  soda, juice, and other mixers might contain a lot of carbohydrates and should be counted.  What foods are not recommended? As you  make food choices, it is important to remember that all foods are not the same. Some foods have fewer nutrients per serving than other foods, even though they might have the same number of calories or carbohydrates. It is difficult to get your body what it needs when you eat foods with fewer nutrients. Examples of foods that you should avoid that are high in calories and carbohydrates but low in nutrients include:  Trans fats (most processed foods list trans fats on the Nutrition Facts label).  Regular soda.  Juice.  Candy.  Sweets, such as cake, pie, doughnuts, and cookies.  Fried foods.  What foods can I eat? Eat nutrient-rich foods, which will nourish your body and keep you healthy. The food you should eat also will depend on several factors, including:  The calories you need.  The medicines you take.  Your weight.  Your blood glucose level.  Your blood pressure level.  Your cholesterol level.  You should eat a variety of foods, including:  Protein. ? Lean cuts of meat. ? Proteins low in saturated fats, such as fish, egg whites, and beans. Avoid processed meats.  Fruits and vegetables. ? Fruits and vegetables that may help control blood glucose levels, such as apples, mangoes, and yams.  Dairy products. ? Choose fat-free or low-fat dairy products, such as milk, yogurt, and cheese.  Grains, bread, pasta, and rice. ? Choose whole grain products, such as multigrain bread, whole oats, and brown rice. These foods may help control blood pressure.  Fats. ? Foods containing healthful fats, such as nuts, avocado, olive oil, canola oil, and fish.  Does everyone with diabetes mellitus have the same meal plan? Because every person with diabetes mellitus is different, there is not one meal plan that works for everyone. It is very important that you  meet with a dietitian who will help you create a meal plan that is just right for you. This information is not intended to replace advice given to you by your health care provider. Make sure you discuss any questions you have with your health care provider. Document Released: 04/09/2005 Document Revised: 12/19/2015 Document Reviewed: 06/09/2013 Elsevier Interactive Patient Education  2017 [employer].

## 2017-03-16 ENCOUNTER — Other Ambulatory Visit: Payer: Self-pay | Admitting: Orthopedic Surgery

## 2017-03-16 ENCOUNTER — Other Ambulatory Visit: Payer: Medicare Other

## 2017-03-16 DIAGNOSIS — I252 Old myocardial infarction: Secondary | ICD-10-CM | POA: Diagnosis not present

## 2017-03-16 DIAGNOSIS — Z794 Long term (current) use of insulin: Secondary | ICD-10-CM

## 2017-03-16 DIAGNOSIS — Z8639 Personal history of other endocrine, nutritional and metabolic disease: Secondary | ICD-10-CM | POA: Diagnosis not present

## 2017-03-16 DIAGNOSIS — E1169 Type 2 diabetes mellitus with other specified complication: Secondary | ICD-10-CM | POA: Diagnosis not present

## 2017-03-16 DIAGNOSIS — E890 Postprocedural hypothyroidism: Secondary | ICD-10-CM

## 2017-03-16 DIAGNOSIS — R5383 Other fatigue: Secondary | ICD-10-CM

## 2017-03-16 DIAGNOSIS — E559 Vitamin D deficiency, unspecified: Secondary | ICD-10-CM | POA: Diagnosis not present

## 2017-03-16 DIAGNOSIS — I10 Essential (primary) hypertension: Secondary | ICD-10-CM | POA: Diagnosis not present

## 2017-03-17 ENCOUNTER — Telehealth: Payer: Self-pay

## 2017-03-17 LAB — CBC WITH DIFFERENTIAL/PLATELET
BASOS ABS: 0 10*3/uL (ref 0.0–0.2)
Basos: 0 %
EOS (ABSOLUTE): 0.3 10*3/uL (ref 0.0–0.4)
Eos: 5 %
HEMOGLOBIN: 11.8 g/dL (ref 11.1–15.9)
Hematocrit: 37.4 % (ref 34.0–46.6)
IMMATURE GRANS (ABS): 0 10*3/uL (ref 0.0–0.1)
Immature Granulocytes: 0 %
LYMPHS ABS: 2.1 10*3/uL (ref 0.7–3.1)
LYMPHS: 38 %
MCH: 28.3 pg (ref 26.6–33.0)
MCHC: 31.6 g/dL (ref 31.5–35.7)
MCV: 90 fL (ref 79–97)
MONOCYTES: 16 %
Monocytes Absolute: 0.9 10*3/uL (ref 0.1–0.9)
NEUTROS ABS: 2.3 10*3/uL (ref 1.4–7.0)
Neutrophils: 41 %
PLATELETS: 333 10*3/uL (ref 150–379)
RBC: 4.17 x10E6/uL (ref 3.77–5.28)
RDW: 14 % (ref 12.3–15.4)
WBC: 5.6 10*3/uL (ref 3.4–10.8)

## 2017-03-17 LAB — T3, FREE: T3 FREE: 2.5 pg/mL (ref 2.0–4.4)

## 2017-03-17 LAB — VITAMIN D 25 HYDROXY (VIT D DEFICIENCY, FRACTURES): Vit D, 25-Hydroxy: 34.3 ng/mL (ref 30.0–100.0)

## 2017-03-17 LAB — TSH: TSH: 0.296 u[IU]/mL — ABNORMAL LOW (ref 0.450–4.500)

## 2017-03-17 LAB — T4, FREE: FREE T4: 1.52 ng/dL (ref 0.82–1.77)

## 2017-03-17 LAB — VITAMIN B12: VITAMIN B 12: 686 pg/mL (ref 232–1245)

## 2017-03-17 NOTE — Telephone Encounter (Signed)
Patient informed the office that per her insurance [employer]) that the following insulins are approved: Engineer, agricultural, Levemir, and Nanine Means.  Sent this to Dr. Raliegh Scarlet to review.  MPulliam, CMA/RT(R)

## 2017-03-19 ENCOUNTER — Other Ambulatory Visit: Payer: Self-pay | Admitting: Family Medicine

## 2017-03-19 ENCOUNTER — Telehealth: Payer: Self-pay

## 2017-03-19 DIAGNOSIS — E1122 Type 2 diabetes mellitus with diabetic chronic kidney disease: Secondary | ICD-10-CM

## 2017-03-19 DIAGNOSIS — Z794 Long term (current) use of insulin: Principal | ICD-10-CM

## 2017-03-19 DIAGNOSIS — N183 Chronic kidney disease, stage 3 (moderate): Principal | ICD-10-CM

## 2017-03-19 MED ORDER — INSULIN DEGLUDEC 100 UNIT/ML ~~LOC~~ SOPN: 18.0000 [IU] | PEN_INJECTOR | Freq: Every day | SUBCUTANEOUS | 1 refills | Status: DC

## 2017-03-19 NOTE — Telephone Encounter (Signed)
How has patient's blood sugars been running on the Lantus?  His Lantus to expensive for her?  Which one is cheapest?

## 2017-03-19 NOTE — Telephone Encounter (Signed)
Spoke to the patient she states that she is check glucose in the mornings (fasting) - this week her readings were 147,137,158, and 114.  Patient is not check after largest meal.  Per Dr. Raliegh Scarlet informed her that we will change her to Nanine Means and that she needs to check in the morning and largest meal of the day.  Patient expressed understanding. MPulliam, CMA/RT(R)

## 2017-03-19 NOTE — Telephone Encounter (Signed)
Received clearance from [place].Dr.Jordan cleared patient for upcoming surgery.Clearence faxed back to fax # [phone].

## 2017-03-19 NOTE — Progress Notes (Unsigned)
Since patient had been on 15 units of Lantus before bedtime when last seen and repeat A1c went from 7.8 prior to over 9 currently, and due to Lantus being too expensive for her, she requests transition to [place].  Script for it  Given--> we'll go up to 18 units nightly.  Patient was instructed to check her fasting blood sugars as well as her 2 hour postprandials.

## 2017-03-23 NOTE — Progress Notes (Signed)
Called left message to call the office back. MPulliam, CMA/RT(R)  

## 2017-03-24 ENCOUNTER — Encounter (HOSPITAL_BASED_OUTPATIENT_CLINIC_OR_DEPARTMENT_OTHER)
Admission: RE | Admit: 2017-03-24 | Discharge: 2017-03-24 | Disposition: A | Discharge status: Home / Self Care | Payer: Medicare Other | Source: Ambulatory Visit | Attending: Orthopedic Surgery | Admitting: Orthopedic Surgery

## 2017-03-24 ENCOUNTER — Encounter (HOSPITAL_BASED_OUTPATIENT_CLINIC_OR_DEPARTMENT_OTHER): Payer: Self-pay | Admitting: *Deleted

## 2017-03-24 DIAGNOSIS — Z01818 Encounter for other preprocedural examination: Secondary | ICD-10-CM | POA: Diagnosis not present

## 2017-03-24 LAB — BASIC METABOLIC PANEL
ANION GAP: 8 (ref 5–15)
BUN: 13 mg/dL (ref 6–20)
CHLORIDE: 105 mmol/L (ref 101–111)
CO2: 27 mmol/L (ref 22–32)
Calcium: 9.2 mg/dL (ref 8.9–10.3)
Creatinine, Ser: 1.02 mg/dL — ABNORMAL HIGH (ref 0.44–1.00)
GFR calc non Af Amer: 50 mL/min — ABNORMAL LOW (ref >60)
GFR, EST AFRICAN AMERICAN: 58 mL/min — AB (ref >60)
Glucose, Bld: 234 mg/dL — ABNORMAL HIGH (ref 65–99)
POTASSIUM: 4.7 mmol/L (ref 3.5–5.1)
SODIUM: 140 mmol/L (ref 135–145)

## 2017-03-24 NOTE — Progress Notes (Signed)
Patient called back, I notified that medication was not 18 units nightly.  MPulliam, CMA/RT(R)

## 2017-03-30 ENCOUNTER — Encounter (HOSPITAL_BASED_OUTPATIENT_CLINIC_OR_DEPARTMENT_OTHER): Payer: Self-pay | Admitting: Certified Registered"

## 2017-03-30 ENCOUNTER — Ambulatory Visit (HOSPITAL_BASED_OUTPATIENT_CLINIC_OR_DEPARTMENT_OTHER): Payer: Medicare Other | Admitting: Certified Registered"

## 2017-03-30 ENCOUNTER — Ambulatory Visit (HOSPITAL_BASED_OUTPATIENT_CLINIC_OR_DEPARTMENT_OTHER)
Admission: RE | Admit: 2017-03-30 | Discharge: 2017-03-30 | Disposition: A | Discharge status: Home / Self Care | Payer: Medicare Other | Source: Ambulatory Visit | Attending: Orthopedic Surgery | Admitting: Orthopedic Surgery

## 2017-03-30 ENCOUNTER — Encounter (HOSPITAL_BASED_OUTPATIENT_CLINIC_OR_DEPARTMENT_OTHER)
Admission: RE | Disposition: A | Discharge status: Home / Self Care | Payer: Self-pay | Source: Ambulatory Visit | Attending: Orthopedic Surgery

## 2017-03-30 DIAGNOSIS — I10 Essential (primary) hypertension: Secondary | ICD-10-CM | POA: Diagnosis not present

## 2017-03-30 DIAGNOSIS — E039 Hypothyroidism, unspecified: Secondary | ICD-10-CM | POA: Diagnosis not present

## 2017-03-30 DIAGNOSIS — Z79899 Other long term (current) drug therapy: Secondary | ICD-10-CM | POA: Diagnosis not present

## 2017-03-30 DIAGNOSIS — Z7982 Long term (current) use of aspirin: Secondary | ICD-10-CM | POA: Insufficient documentation

## 2017-03-30 DIAGNOSIS — M65351 Trigger finger, right little finger: Secondary | ICD-10-CM | POA: Diagnosis not present

## 2017-03-30 DIAGNOSIS — M65341 Trigger finger, right ring finger: Secondary | ICD-10-CM | POA: Insufficient documentation

## 2017-03-30 DIAGNOSIS — I2510 Atherosclerotic heart disease of native coronary artery without angina pectoris: Secondary | ICD-10-CM | POA: Diagnosis not present

## 2017-03-30 DIAGNOSIS — E785 Hyperlipidemia, unspecified: Secondary | ICD-10-CM | POA: Diagnosis not present

## 2017-03-30 DIAGNOSIS — Z9841 Cataract extraction status, right eye: Secondary | ICD-10-CM | POA: Insufficient documentation

## 2017-03-30 DIAGNOSIS — Z794 Long term (current) use of insulin: Secondary | ICD-10-CM | POA: Insufficient documentation

## 2017-03-30 DIAGNOSIS — Z8719 Personal history of other diseases of the digestive system: Secondary | ICD-10-CM | POA: Diagnosis not present

## 2017-03-30 DIAGNOSIS — I252 Old myocardial infarction: Secondary | ICD-10-CM | POA: Diagnosis not present

## 2017-03-30 DIAGNOSIS — E119 Type 2 diabetes mellitus without complications: Secondary | ICD-10-CM | POA: Insufficient documentation

## 2017-03-30 DIAGNOSIS — Z955 Presence of coronary angioplasty implant and graft: Secondary | ICD-10-CM | POA: Diagnosis not present

## 2017-03-30 DIAGNOSIS — M65841 Other synovitis and tenosynovitis, right hand: Secondary | ICD-10-CM | POA: Diagnosis not present

## 2017-03-30 HISTORY — PX: TRIGGER FINGER RELEASE: SHX641

## 2017-03-30 LAB — GLUCOSE, CAPILLARY
Glucose-Capillary: 148 mg/dL — ABNORMAL HIGH (ref 65–99)
Glucose-Capillary: 138 mg/dL — ABNORMAL HIGH (ref 65–99)

## 2017-03-30 SURGERY — RELEASE, A1 PULLEY, FOR TRIGGER FINGER
Anesthesia: Regional | Site: Finger | Laterality: Right

## 2017-03-30 MED ORDER — OXYCODONE HCL 5 MG/5ML PO SOLN: 5.0000 mg | Freq: Once | ORAL | Status: DC | PRN

## 2017-03-30 MED ORDER — ONDANSETRON HCL 4 MG/2ML IJ SOLN
INTRAMUSCULAR | Status: DC | PRN
Start: 2017-03-30 — End: 2017-03-30
  Administered 2017-03-30: 4 mg via INTRAVENOUS

## 2017-03-30 MED ORDER — HYDROMORPHONE HCL 1 MG/ML IJ SOLN: 0.2500 mg | INTRAMUSCULAR | Status: DC | PRN

## 2017-03-30 MED ORDER — FENTANYL CITRATE (PF) 100 MCG/2ML IJ SOLN
INTRAMUSCULAR | Status: AC
  Filled 2017-03-30: qty 2

## 2017-03-30 MED ORDER — BUPIVACAINE HCL (PF) 0.5 % IJ SOLN
INTRAMUSCULAR | Status: AC
  Filled 2017-03-30: qty 120

## 2017-03-30 MED ORDER — SCOPOLAMINE 1 MG/3DAYS TD PT72: 1.0000 | MEDICATED_PATCH | Freq: Once | TRANSDERMAL | Status: DC | PRN

## 2017-03-30 MED ORDER — PROMETHAZINE HCL 25 MG/ML IJ SOLN: 6.2500 mg | INTRAMUSCULAR | Status: DC | PRN

## 2017-03-30 MED ORDER — CEFAZOLIN SODIUM-DEXTROSE 2-4 GM/100ML-% IV SOLN
INTRAVENOUS | Status: AC
  Filled 2017-03-30: qty 100

## 2017-03-30 MED ORDER — FENTANYL CITRATE (PF) 100 MCG/2ML IJ SOLN
50.0000 ug | INTRAMUSCULAR | Status: DC | PRN
  Administered 2017-03-30: 50 ug via INTRAVENOUS

## 2017-03-30 MED ORDER — HYDROCODONE-ACETAMINOPHEN 5-325 MG PO TABS: 1.0000 | ORAL_TABLET | Freq: Four times a day (QID) | ORAL | 0 refills | Status: DC | PRN

## 2017-03-30 MED ORDER — OXYCODONE HCL 5 MG PO TABS: 5.0000 mg | ORAL_TABLET | Freq: Once | ORAL | Status: DC | PRN

## 2017-03-30 MED ORDER — MIDAZOLAM HCL 2 MG/2ML IJ SOLN: 1.0000 mg | INTRAMUSCULAR | Status: DC | PRN

## 2017-03-30 MED ORDER — BUPIVACAINE HCL (PF) 0.5 % IJ SOLN
INTRAMUSCULAR | Status: DC | PRN
  Administered 2017-03-30: 10 mL

## 2017-03-30 MED ORDER — CHLORHEXIDINE GLUCONATE 4 % EX LIQD: 60.0000 mL | Freq: Once | CUTANEOUS | Status: DC

## 2017-03-30 MED ORDER — LACTATED RINGERS IV SOLN
INTRAVENOUS | Status: DC
  Administered 2017-03-30: 08:00:00 via INTRAVENOUS

## 2017-03-30 MED ORDER — LIDOCAINE HCL (PF) 0.5 % IJ SOLN
INTRAMUSCULAR | Status: DC | PRN
  Administered 2017-03-30: 30 mL via INTRAVENOUS

## 2017-03-30 MED ORDER — PROPOFOL 500 MG/50ML IV EMUL
INTRAVENOUS | Status: DC | PRN
  Administered 2017-03-30: 50 ug/kg/min via INTRAVENOUS

## 2017-03-30 MED ORDER — CEFAZOLIN SODIUM-DEXTROSE 2-4 GM/100ML-% IV SOLN
2.0000 g | INTRAVENOUS | Status: AC
  Administered 2017-03-30: 2 g via INTRAVENOUS

## 2017-03-30 SURGICAL SUPPLY — 35 items
BANDAGE COBAN STERILE 2 (GAUZE/BANDAGES/DRESSINGS) ×3 IMPLANT
BLADE SURG 15 STRL LF DISP TIS (BLADE) ×1 IMPLANT
BLADE SURG 15 STRL SS (BLADE) ×3
BNDG CMPR 9X4 STRL LF SNTH (GAUZE/BANDAGES/DRESSINGS)
BNDG ESMARK 4X9 LF (GAUZE/BANDAGES/DRESSINGS) IMPLANT
CHLORAPREP W/TINT 26ML (MISCELLANEOUS) ×3 IMPLANT
CORD BIPOLAR FORCEPS 12FT (ELECTRODE) ×2 IMPLANT
COVER BACK TABLE 60X90IN (DRAPES) ×3 IMPLANT
COVER MAYO STAND STRL (DRAPES) ×3 IMPLANT
CUFF TOURNIQUET SINGLE 18IN (TOURNIQUET CUFF) ×2 IMPLANT
DECANTER SPIKE VIAL GLASS SM (MISCELLANEOUS) IMPLANT
DRAPE EXTREMITY T 121X128X90 (DRAPE) ×3 IMPLANT
DRAPE SURG 17X23 STRL (DRAPES) ×3 IMPLANT
GAUZE SPONGE 4X4 12PLY STRL (GAUZE/BANDAGES/DRESSINGS) ×3 IMPLANT
GAUZE XEROFORM 1X8 LF (GAUZE/BANDAGES/DRESSINGS) ×3 IMPLANT
GLOVE BIO SURGEON STRL SZ 6.5 (GLOVE) ×1 IMPLANT
GLOVE BIO SURGEONS STRL SZ 6.5 (GLOVE) ×1
GLOVE BIOGEL PI IND STRL 7.0 (GLOVE) IMPLANT
GLOVE BIOGEL PI IND STRL 8.5 (GLOVE) ×1 IMPLANT
GLOVE BIOGEL PI INDICATOR 7.0 (GLOVE) ×4
GLOVE BIOGEL PI INDICATOR 8.5 (GLOVE) ×2
GLOVE SURG ORTHO 8.0 STRL STRW (GLOVE) ×3 IMPLANT
GOWN STRL REUS W/ TWL LRG LVL3 (GOWN DISPOSABLE) ×1 IMPLANT
GOWN STRL REUS W/TWL LRG LVL3 (GOWN DISPOSABLE) ×3
GOWN STRL REUS W/TWL XL LVL3 (GOWN DISPOSABLE) ×3 IMPLANT
NDL PRECISIONGLIDE 27X1.5 (NEEDLE) ×1 IMPLANT
NEEDLE PRECISIONGLIDE 27X1.5 (NEEDLE) ×3 IMPLANT
NS IRRIG 1000ML POUR BTL (IV SOLUTION) ×3 IMPLANT
PACK BASIN DAY SURGERY FS (CUSTOM PROCEDURE TRAY) ×3 IMPLANT
STOCKINETTE 4X48 STRL (DRAPES) ×3 IMPLANT
SUT ETHILON 4 0 PS 2 18 (SUTURE) ×3 IMPLANT
SYR BULB 3OZ (MISCELLANEOUS) ×3 IMPLANT
SYR CONTROL 10ML LL (SYRINGE) ×3 IMPLANT
TOWEL OR 17X24 6PK STRL BLUE (TOWEL DISPOSABLE) ×6 IMPLANT
UNDERPAD 30X30 (UNDERPADS AND DIAPERS) ×3 IMPLANT

## 2017-03-30 NOTE — Anesthesia Postprocedure Evaluation (Signed)
Anesthesia Post Note  Patient: Alicia Medina  Procedure(s) Performed: Procedure(s) (LRB): RELEASE TRIGGER FINGER/A-1 PULLEY RIGHT RING FINGER AND RIGHT SMALL FINGER (Right)     Patient location during evaluation: PACU Anesthesia Type: Bier Block Level of consciousness: awake and alert Pain management: pain level controlled Vital Signs Assessment: post-procedure vital signs reviewed and stable Respiratory status: spontaneous breathing, nonlabored ventilation and respiratory function stable Cardiovascular status: blood pressure returned to baseline and stable Postop Assessment: no signs of nausea or vomiting Anesthetic complications: no    Last Vitals:  Vitals:   03/30/17 0945 03/30/17 1005  BP: (!) 124/96 (!) 146/70  Pulse: (!) 57 (!) 58  Resp: 12 16  Temp:  36.7 C  SpO2: 99% 100%    Last Pain:  Vitals:   03/30/17 1005  TempSrc:   PainSc: 0-No pain                 Lynda Rainwater

## 2017-03-30 NOTE — Anesthesia Procedure Notes (Signed)
Anesthesia Regional Block: Bier block (IV Regional)   Pre-Anesthetic Checklist: ,, timeout performed, Correct Patient, Correct Site, Correct Laterality, Correct Procedure,, site marked, surgical consent,, at surgeon's request  Laterality: Right     Needles:  Injection technique: Single-shot  Needle Type: Other      Needle Gauge: 22     Additional Needles:   Procedures:,,,,,,, Esmarch exsanguination, single tourniquet utilized,  Narrative:   Performed by: Personally       

## 2017-03-30 NOTE — Op Note (Signed)
Dictation Number [id number]

## 2017-03-30 NOTE — Brief Op Note (Signed)
03/30/2017  9:08 AM  PATIENT:  Alicia Medina  83 y.o. female  PRE-OPERATIVE DIAGNOSIS:  TRIGGER FINGER, RIGHT SMALL FINGER AND RIGHT RING FINGER  POST-OPERATIVE DIAGNOSIS:  TRIGGER FINGER, RIGHT SMALL FINGER AND RIGHT RING FINGER  PROCEDURE:  Procedure(s): RELEASE TRIGGER FINGER/A-1 PULLEY RIGHT RING FINGER AND RIGHT SMALL FINGER (Right)  SURGEON:  Surgeon(s) and Role:    * Daryll Brod, MD - Primary  PHYSICIAN ASSISTANT:   ASSISTANTS: none   ANESTHESIA:   local, regional and IV sedation  EBL:  Total I/O In: 400 [I.V.:400] Out: -   BLOOD ADMINISTERED:none  DRAINS: none   LOCAL MEDICATIONS USED:  BUPIVICAINE   SPECIMEN:  No Specimen  DISPOSITION OF SPECIMEN:  N/A  COUNTS:  YES  TOURNIQUET:   Total Tourniquet Time Documented: Forearm (Right) - 26 minutes Total: Forearm (Right) - 26 minutes   DICTATION: .Other Dictation: Dictation Number [id number]  PLAN OF CARE: Discharge to home after PACU  PATIENT DISPOSITION:  PACU - hemodynamically stable.

## 2017-03-30 NOTE — Anesthesia Procedure Notes (Signed)
Procedure Name: MAC Date/Time: 03/30/2017 8:48 AM Performed by: Waver Dibiasio D Pre-anesthesia Checklist: Patient identified, Emergency Drugs available, Suction available, Patient being monitored and Timeout performed Patient Re-evaluated:Patient Re-evaluated prior to induction Oxygen Delivery Method: Simple face mask

## 2017-03-30 NOTE — H&P (Signed)
Alicia Medina is an 83 y.o. female.   Chief Complaint: catching right ring and small fingers HPI: Alicia Medina is 56 with stenosing tenosynovitis right ring and small   fingers. This was injected for twotimes. She states her pain is entirely gone. She does notice  triggering at the present time.       Past Medical History:  Diagnosis Date  . Arthritis   . CAD (coronary artery disease) CARDIOLOGIST-  DR Martinique   08-28-2011 Inferior STEMI with BMS to the RCA, complicated by cardiogenic shock, VF, VDRF , temporary CHB and abrupt reocclusion of the RCA stent treated with a second BMS to the RCA. Has residual LAD disease. EF is 50%  . Diverticulosis of colon   . Hemorrhoids, internal, with bleeding   . Hyperlipidemia   . Hypertension   . Hypothyroidism, postsurgical   . S/p bare metal coronary artery stent    2013  . Type 2 diabetes mellitus ([place])   . Wears hearing aid    both ears    Past Surgical History:  Procedure Laterality Date  . CARDIOVASCULAR STRESS TEST  10-03-2011  dr Martinique   normal perfusion / no ischemia/ ef 77%  . CARPAL TUNNEL RELEASE Right 01/25/2013   Procedure: CARPAL TUNNEL RELEASE;  Surgeon: Wynonia Sours, MD;  Location: [place];  Service: Orthopedics;  Laterality: Right;  . CARPAL TUNNEL RELEASE Left 10/25/2013   Procedure: LEFT CARPAL TUNNEL RELEASE;  Surgeon: Wynonia Sours, MD;  Location: [place];  Service: Orthopedics;  Laterality: Left;  . CATARACT EXTRACTION W/ INTRAOCULAR LENS  IMPLANT, BILATERAL  2007  . COLONOSCOPY  04-27-2003  . CORONARY ANGIOGRAM Left 08/28/2011   Procedure: CORONARY ANGIOGRAM;  Surgeon: Peter M Martinique, MD;  Location: [facility] CATH LAB;  Service: Cardiovascular;  Laterality: Left;  . CORONARY ANGIOPLASTY WITH STENT PLACEMENT  08-28-2011  dr Martinique   BMS to [facility] with abrupt reocclusion treated with second BMS/  LAD ostial 90%, proximal50-60%, first diagonal 80-90%  . LEFT HEART CATHETERIZATION WITH CORONARY ANGIOGRAM  N/A 08/28/2011   Procedure: LEFT HEART CATHETERIZATION WITH CORONARY ANGIOGRAM;  Surgeon: Peter M Martinique, MD;  Location: [facility] CATH LAB;  Service: Cardiovascular;  Laterality: N/A;  . PERCUTANEOUS CORONARY STENT INTERVENTION (PCI-S) Left 08/28/2011   Procedure: PERCUTANEOUS CORONARY STENT INTERVENTION (PCI-S);  Surgeon: Peter M Martinique, MD;  Location: [facility] CATH LAB;  Service: Cardiovascular;  Laterality: Left;  . TOTAL THYROIDECTOMY Bilateral 05-02-2007   multinodular goiter  . TRANSANAL HEMORRHOIDAL DEARTERIALIZATION N/A 03/28/2014   Procedure: TRANSANAL HEMORRHOIDAL DEARTERIALIZATION;  Surgeon: Leighton Ruff, MD;  Location: [facility];  Service: General;  Laterality: N/A;  . TRANSTHORACIC ECHOCARDIOGRAM  08-30-2011   mild LVH/ ef 81-19%/  grade I diastolic dysfunction  . TRIGGER FINGER RELEASE Right 01/25/2013   Procedure: RELEASE TRIGGER FINGER/A-1 PULLEY RIGHT INDEX FINGER;  Surgeon: Wynonia Sours, MD;  Location: [place];  Service: Orthopedics;  Laterality: Right;  . TRIGGER FINGER RELEASE Left 10/25/2013   Procedure: RELEASE A-1 PULLEY LEFT MIDDLE FINGER;  Surgeon: Wynonia Sours, MD;  Location: [place];  Service: Orthopedics;  Laterality: Left;  . TRIGGER FINGER RELEASE Left 12/04/2014   Procedure: RELEASE TRIGGER FINGER/A-1 PULLEY LEFT RING FINGER;  Surgeon: Daryll Brod, MD;  Location: [place];  Service: Orthopedics;  Laterality: Left;    Family History  Problem Relation Age of Onset  . Stroke Mother   . Cancer Mother  breast  . Cancer Father        lung  . Stroke Father    Social History:  reports that she has never smoked. She has never used smokeless tobacco. She reports that she drinks about 0.6 oz of alcohol per week . She reports that she does not use drugs.  Allergies:  Allergies  Allergen Reactions  . Demeclocycline Other (See Comments)    Severe stomach cramps  . Lisinopril Cough  . Tetracyclines & Related Other  (See Comments)    Severe stomach cramps    No prescriptions prior to admission.    No results found for this or any previous visit (from the past 48 hour(s)).  No results found.   Pertinent items are noted in HPI.  Height 5\' 4"  (1.626 m), weight 68.9 kg (152 lb).  General appearance: alert, cooperative and appears stated age Head: Normocephalic, without obvious abnormality Neck: no JVD Resp: clear to auscultation bilaterally Cardio: regular rate and rhythm, S1, S2 normal, no murmur, click, rub or gallop GI: soft, non-tender; bowel sounds normal; no masses,  no organomegaly Extremities: extremities normal, atraumatic, no cyanosis or edema catching ring and small fingers right side Pulses: 2+ and symmetric Skin: Skin color, texture, turgor normal. No rashes or lesions Neurologic: Grossly normal Incision/Wound: na  Assessment/Plan Diagnosis stenosing tenosynovitis right ring and small finger  Plan: She advised  surgical release is necessary Pre, peri, and postoperative course have been discussed along with risks and complications.  He is aware that there is no guarantee with the surgery, the possibility of infection, recurrence of injury to arteries, nerves and tendons, and incomplete relief of symptoms.   She is scheduled for a-1 pulley release ring and small fingers right side     Lewayne Pauley R 03/30/2017, 5:31 AM

## 2017-03-30 NOTE — Anesthesia Preprocedure Evaluation (Signed)
Anesthesia Evaluation  Patient identified by MRN, date of birth, ID band Patient awake    Reviewed: Allergy & Precautions, NPO status , Patient's Chart, lab work & pertinent test results, reviewed documented beta blocker date and time   Airway Mallampati: I  TM Distance: >3 FB Neck ROM: Full    Dental  (+) Teeth Intact, Dental Advisory Given   Pulmonary    breath sounds clear to auscultation       Cardiovascular hypertension, Pt. on medications and Pt. on home beta blockers + CAD and + Past MI   Rhythm:Regular Rate:Normal     Neuro/Psych    GI/Hepatic   Endo/Other  diabetes, Well Controlled, Type 2  Renal/GU      Musculoskeletal  (+) Arthritis , Osteoarthritis,    Abdominal   Peds  Hematology   Anesthesia Other Findings   Reproductive/Obstetrics                             Anesthesia Physical  Anesthesia Plan  ASA: III  Anesthesia Plan: Bier Block   Post-op Pain Management:    Induction: Intravenous  PONV Risk Score and Plan: 2 and Ondansetron and Midazolam  Airway Management Planned: Simple Face Mask  Additional Equipment:   Intra-op Plan:   Post-operative Plan:   Informed Consent: I have reviewed the patients History and Physical, chart, labs and discussed the procedure including the risks, benefits and alternatives for the proposed anesthesia with the patient or authorized representative who has indicated his/her understanding and acceptance.   Dental advisory given  Plan Discussed with: CRNA, Anesthesiologist and Surgeon  Anesthesia Plan Comments:         Anesthesia Quick Evaluation

## 2017-03-30 NOTE — Discharge Instructions (Addendum)

## 2017-03-30 NOTE — Op Note (Signed)
NAMEJACQUES, WILLINGHAM NO.:  [id number]  MEDICAL RECORD NO.:  [id number]  LOCATION:                                 FACILITY:  PHYSICIAN:  Daryll Brod, M.D.            DATE OF BIRTH:  DATE OF PROCEDURE:  03/30/2017 DATE OF DISCHARGE:                              OPERATIVE REPORT   PREOPERATIVE DIAGNOSIS:  Stenosing tenosynovitis, right ring and right small fingers.  POSTOPERATIVE DIAGNOSIS:  Stenosing tenosynovitis, right ring and right small fingers.  OPERATION:  Release of A1 pulley, right ring and right small fingers.  SURGEON:  Daryll Brod, M.D.  ANESTHESIA:  Forearm-based IV regional with local infiltration.  PLACE OF SURGERY:  [facility].  ANESTHESIOLOGIST:  Dr. Sabra Heck.  HISTORY:  The patient is an 83 year old female with a history of triggering of her right ring and small fingers.  She has elected to undergo surgical release, having had this not respond to conservative treatment including multiple injections.  Pre, peri, and postoperative course have been discussed along with risks and complications.  She is aware that there is no guarantee to the surgery; the possibility of infection; recurrence of injury to arteries, nerves, tendons; incomplete relief of symptoms; dystrophy.  In the preoperative area, the patient was seen, the extremity was marked by both the patient and surgeon. Antibiotic given.  DESCRIPTION OF PROCEDURE:  The patient was brought to the operating room, where a forearm-based IV regional anesthetic was carried out without difficulty.  She was prepped using ChloraPrep in a supine position with the right arm free.  A 3-minute dry time was allowed. Time-out taken, confirming the patient and procedure.  After adequate anesthesia was afforded to the patient, an oblique incision was made over the A1 pulley of the right ring finger.  This was carried down through subcutaneous tissue.  Neurovascular structures  identified radially and ulnarly.  The retractors were placed.  The A1 pulley was identified.  This was released on its radial aspect with a small incision made centrally in A2.  The two flexor tendons were then separated to break any adhesions of the tenosynovial tissue.  The finger was placed through a full range of motion.  No apparent triggering was noted.  The small finger was attended to next.  An oblique incision was made over the A1 pulley of the small finger.  This was carried down through subcutaneous tissue.  Neurovascular structures were protected with retractors.  The A1 pulley was identified and released on its radial aspect with a small incision made centrally in A2.  The wounds were copiously irrigated with saline.  The finger was placed through a full range motion of the small finger.  No further triggering was noted. The wounds were then closed with interrupted 4-0 nylon sutures.  A local infiltration with 0.25% bupivacaine without epinephrine was given.  10 mL was used.  The dressing was placed with full extension of the ring finger.  A very minimal trigger was still noted.  Sutures were removed from that finger.  Further central incision on the A2 pulley was made. No further triggering was noted.  Wound was again irrigated.  The skin closed with interrupted 4-0 nylon sutures.  Sterile compressive dressing with fingers free was applied.  On deflation of the tourniquet, all fingers immediately pinked.  She was taken to the recovery room for observation in satisfactory condition.  She will be discharged to home to return to the [facility] in 1 week, on [place].          ______________________________ Daryll Brod, M.D.     GK/MEDQ  D:  03/30/2017  T:  03/30/2017  Job:  [id number]

## 2017-03-30 NOTE — Transfer of Care (Signed)
Immediate Anesthesia Transfer of Care Note  Patient: Alicia Medina  Procedure(s) Performed: Procedure(s): RELEASE TRIGGER FINGER/A-1 PULLEY RIGHT RING FINGER AND RIGHT SMALL FINGER (Right)  Patient Location: PACU  Anesthesia Type:Bier block  Level of Consciousness: awake, alert , oriented and patient cooperative  Airway & Oxygen Therapy: Patient Spontanous Breathing and Patient connected to face mask oxygen  Post-op Assessment: Report given to RN and Post -op Vital signs reviewed and stable  Post vital signs: Reviewed and stable  Last Vitals:  Vitals:   03/30/17 0749  BP: (!) 150/62  Pulse: (!) 56  Resp: 20  Temp: 36.7 C  SpO2: 99%    Last Pain:  Vitals:   03/30/17 0749  TempSrc: Oral         Complications: No apparent anesthesia complications

## 2017-03-31 ENCOUNTER — Encounter (HOSPITAL_BASED_OUTPATIENT_CLINIC_OR_DEPARTMENT_OTHER): Payer: Self-pay | Admitting: Orthopedic Surgery

## 2017-04-06 DIAGNOSIS — N8189 Other female genital prolapse: Secondary | ICD-10-CM | POA: Diagnosis not present

## 2017-04-20 ENCOUNTER — Other Ambulatory Visit: Payer: Self-pay | Admitting: Adult Health

## 2017-04-20 ENCOUNTER — Other Ambulatory Visit: Payer: Self-pay | Admitting: Cardiology

## 2017-05-03 DIAGNOSIS — Z6827 Body mass index (BMI) 27.0-27.9, adult: Secondary | ICD-10-CM | POA: Diagnosis not present

## 2017-05-03 DIAGNOSIS — Z01419 Encounter for gynecological examination (general) (routine) without abnormal findings: Secondary | ICD-10-CM | POA: Diagnosis not present

## 2017-05-25 DIAGNOSIS — Z23 Encounter for immunization: Secondary | ICD-10-CM | POA: Diagnosis not present

## 2017-06-11 ENCOUNTER — Ambulatory Visit (INDEPENDENT_AMBULATORY_CARE_PROVIDER_SITE_OTHER): Payer: Medicare Other | Admitting: Family Medicine

## 2017-06-11 ENCOUNTER — Encounter: Payer: Self-pay | Admitting: Family Medicine

## 2017-06-11 VITALS — BP 130/74 | HR 72 | Ht 65.0 in | Wt 161.2 lb

## 2017-06-11 DIAGNOSIS — I152 Hypertension secondary to endocrine disorders: Secondary | ICD-10-CM

## 2017-06-11 DIAGNOSIS — N183 Chronic kidney disease, stage 3 unspecified: Secondary | ICD-10-CM

## 2017-06-11 DIAGNOSIS — E1169 Type 2 diabetes mellitus with other specified complication: Secondary | ICD-10-CM | POA: Diagnosis not present

## 2017-06-11 DIAGNOSIS — Z794 Long term (current) use of insulin: Secondary | ICD-10-CM | POA: Diagnosis not present

## 2017-06-11 DIAGNOSIS — E1159 Type 2 diabetes mellitus with other circulatory complications: Secondary | ICD-10-CM | POA: Diagnosis not present

## 2017-06-11 DIAGNOSIS — I2510 Atherosclerotic heart disease of native coronary artery without angina pectoris: Secondary | ICD-10-CM

## 2017-06-11 DIAGNOSIS — I10 Essential (primary) hypertension: Secondary | ICD-10-CM | POA: Diagnosis not present

## 2017-06-11 DIAGNOSIS — E0829 Diabetes mellitus due to underlying condition with other diabetic kidney complication: Secondary | ICD-10-CM | POA: Diagnosis not present

## 2017-06-11 DIAGNOSIS — E785 Hyperlipidemia, unspecified: Secondary | ICD-10-CM

## 2017-06-11 DIAGNOSIS — E1122 Type 2 diabetes mellitus with diabetic chronic kidney disease: Secondary | ICD-10-CM

## 2017-06-11 DIAGNOSIS — S29012A Strain of muscle and tendon of back wall of thorax, initial encounter: Secondary | ICD-10-CM

## 2017-06-11 DIAGNOSIS — F4329 Adjustment disorder with other symptoms: Secondary | ICD-10-CM | POA: Insufficient documentation

## 2017-06-11 DIAGNOSIS — R809 Proteinuria, unspecified: Secondary | ICD-10-CM | POA: Diagnosis not present

## 2017-06-11 LAB — POCT GLYCOSYLATED HEMOGLOBIN (HGB A1C): Hemoglobin A1C: 8.1

## 2017-06-11 MED ORDER — TRUETRACK TEST VI STRP: ORAL_STRIP | 5 refills | Status: DC

## 2017-06-11 MED ORDER — INSULIN DEGLUDEC 100 UNIT/ML ~~LOC~~ SOPN: 20.0000 [IU] | PEN_INJECTOR | Freq: Every day | SUBCUTANEOUS | 3 refills | Status: DC

## 2017-06-11 MED ORDER — LOSARTAN POTASSIUM 25 MG PO TABS: 25.0000 mg | ORAL_TABLET | Freq: Every morning | ORAL | 3 refills | Status: DC

## 2017-06-11 NOTE — Patient Instructions (Addendum)
Please let me know if you change your mind and would like a muscle relaxer, and or something for the increased stress that you are under with Deidre Ala and your sons etc.  This can be very helpful to you and they are not addictive etc.  Otherwise for your muscle spasm please use a warm washcloth or rice bag that you heat in the microwave to the area which will increase blood flow.  You can continue the Tylenol and if you would like muscle relaxers let me know.  You can rub BenGay or Vicks VapoRub on it which may help increase blood flow to the area.  You can use over-the-counter salicylic acid at 61% which is the extra strength Compound W type wart remover medicine.  Apply to the skin area 2 times daily keep it covered otherwise.  You need to apply the medicine daily for 6-8 weeks before you are going to see a significant effect.   Diabetes Mellitus and Standards of Medical Care  Managing diabetes (diabetes mellitus) can be complicated. Your diabetes treatment may be managed by a team of health care providers, including:  A diet and nutrition specialist (registered dietitian).  A nurse.  A certified diabetes educator (CDE).  A diabetes specialist (endocrinologist).  An eye doctor.  A primary care provider.  A dentist.  Your health care providers follow a schedule in order to help you get the best quality of care. The following schedule is a general guideline for your diabetes management plan. Your health care providers may also give you more specific instructions.  HbA1c (hemoglobin A1c) test This test provides information about blood sugar (glucose) control over the previous 2-3 months. It is used to check whether your diabetes management plan needs to be adjusted.  If you are meeting your treatment goals, this test is done at least 2 times a year.  If you are not meeting treatment goals or if your treatment goals have changed, this test is done 4 times a year.  Blood pressure  test  This test is done at every routine medical visit. For most people, the goal is less than 130/80. Ask your health care provider what your goal blood pressure should be.  Dental and eye exams  Visit your dentist two times a year.  If you have type 1 diabetes, get an eye exam 3-5 years after you are diagnosed, and then once a year after your first exam. ? If you were diagnosed with type 1 diabetes as a child, get an eye exam when you are age 43 or older and have had diabetes for 3-5 years. After the first exam, you should get an eye exam once a year.  If you have type 2 diabetes, have an eye exam as soon as you are diagnosed, and then once a year after your first exam.  Foot care exam  Visual foot exams are done at every routine medical visit. The exams check for cuts, bruises, redness, blisters, sores, or other problems with the feet.  A complete foot exam is done by your health care provider once a year. This exam includes an inspection of the structure and skin of your feet, and a check of the pulses and sensation in your feet. ? Type 1 diabetes: Get your first exam 3-5 years after diagnosis. ? Type 2 diabetes: Get your first exam as soon as you are diagnosed.  Check your feet every day for cuts, bruises, redness, blisters, or sores. If you have any of  these or other problems that are not healing, contact your health care provider.  Kidney function test (urine microalbumin)  This test is done once a year. ? Type 1 diabetes: Get your first test 5 years after diagnosis. ? Type 2 diabetes: Get your first test as soon as you are diagnosed._  If you have chronic kidney disease (CKD), get a serum creatinine and estimated glomerular filtration rate (eGFR) test once a year.  Lipid profile (cholesterol, HDL, LDL, triglycerides)  This test should be done when you are diagnosed with diabetes, and every 5 years after the first test. If you are on medicines to lower your cholesterol, you  may need to get this test done every year. ? The goal for LDL is less than 100 mg/dL (5.5 mmol/L). If you are at high risk, the goal is less than 70 mg/dL (3.9 mmol/L). ? The goal for HDL is 40 mg/dL (2.2 mmol/L) for men and 50 mg/dL(2.8 mmol/L) for women. An HDL cholesterol of 60 mg/dL (3.3 mmol/L) or higher gives some protection against heart disease. ? The goal for triglycerides is less than 150 mg/dL (8.3 mmol/L).  Immunizations  The yearly flu (influenza) vaccine is recommended for everyone 6 months or older who has diabetes.  The pneumonia (pneumococcal) vaccine is recommended for everyone 2 years or older who has diabetes. If you are 42 or older, you may get the pneumonia vaccine as a series of two separate shots.  The hepatitis B vaccine is recommended for adults shortly after they have been diagnosed with diabetes.  The Tdap (tetanus, diphtheria, and pertussis) vaccine should be given: ? According to normal childhood vaccination schedules, for children. ? Every 10 years, for adults who have diabetes.  The shingles vaccine is recommended for people who have had chicken pox and are 50 years or older.  Mental and emotional health  Screening for symptoms of eating disorders, anxiety, and depression is recommended at the time of diagnosis and afterward as needed. If your screening shows that you have symptoms (you have a positive screening result), you may need further evaluation and be referred to a mental health care provider.  Diabetes self-management education  Education about how to manage your diabetes is recommended at diagnosis and ongoing as needed.  Treatment plan  Your treatment plan will be reviewed at every medical visit.  Summary  Managing diabetes (diabetes mellitus) can be complicated. Your diabetes treatment may be managed by a team of health care providers.  Your health care providers follow a schedule in order to help you get the best quality of  care.  Standards of care including having regular physical exams, blood tests, blood pressure monitoring, immunizations, screening tests, and education about how to manage your diabetes.  Your health care providers may also give you more specific instructions based on your individual health.      Type 2 Diabetes Mellitus, Self Care, Adult Caring for yourself after you have been diagnosed with type 2 diabetes (type 2 diabetes mellitus) means keeping your blood sugar (glucose) under control with a balance of:  Nutrition.  Exercise.  Lifestyle changes.  Medicines or insulin, if necessary.  Support from your team of health care providers and others.  The following information explains what you need to know to manage your diabetes at home. What do I need to do to manage my blood glucose?  Check your blood glucose every day, as often as told by your health care provider.  Contact your health care provider  if your blood glucose is above your target for 2 tests in a row.  Have your A1c (hemoglobin A1c) level checked at least two times a year, or as often as told by your health care provider. Your health care provider will set individualized treatment goals for you. Generally, the goal of treatment is to maintain the following blood glucose levels:  Before meals (preprandial): 80-130 mg/dL (4.4-7.2 mmol/L).  After meals (postprandial): below 180 mg/dL (10 mmol/L).  A1c level: less than 7%.  What do I need to know about hyperglycemia and hypoglycemia? What is hyperglycemia? Hyperglycemia, also called high blood glucose, occurs when blood glucose is too high.Make sure you know the early signs of hyperglycemia, such as:  Increased thirst.  Hunger.  Feeling very tired.  Needing to urinate more often than usual.  Blurry vision.  What is hypoglycemia? Hypoglycemia, also called low blood glucose, occurswith a blood glucose level at or below 70 mg/dL (3.9 mmol/L). The risk for  hypoglycemia increases during or after exercise, during sleep, during illness, and when skipping meals or not eating for a long time (fasting). It is important to know the symptoms of hypoglycemia and treat it right away. Always have a 15-gram rapid-acting carbohydrate snack with you to treat low blood glucose. Family members and close friends should also know the symptoms and should understand how to treat hypoglycemia, in case you are not able to treat yourself. What are the symptoms of hypoglycemia? Hypoglycemia symptoms can include:  Hunger.  Anxiety.  Sweating and feeling clammy.  Confusion.  Dizziness or feeling light-headed.  Sleepiness.  Nausea.  Increased heart rate.  Headache.  Blurry vision.  Seizure.  Nightmares.  Tingling or numbness around the mouth, lips, or tongue.  A change in speech.  Decreased ability to concentrate.  A change in coordination.  Restless sleep.  Tremors or shakes.  Fainting.  Irritability.  How do I treat hypoglycemia?  If you are alert and able to swallow safely, follow the 15:15 rule:  Take 15 grams of a rapid-acting carbohydrate. Rapid-acting options include: ? 1 tube of glucose gel. ? 3 glucose pills. ? 6-8 pieces of hard candy. ? 4 oz (120 mL) of fruit juice. ? 4 oz (120 mL) of regular (not diet) soda.  Check your blood glucose 15 minutes after you take the carbohydrate.  If the repeat blood glucose level is still at or below 70 mg/dL (3.9 mmol/L), take 15 grams of a carbohydrate again.  If your blood glucose level does not increase above 70 mg/dL (3.9 mmol/L) after 3 tries, seek emergency medical care.  After your blood glucose level returns to normal, eat a meal or a snack within 1 hour.  How do I treat severe hypoglycemia? Severe hypoglycemia is when your blood glucose level is at or below 54 mg/dL (3 mmol/L). Severe hypoglycemia is an emergency. Do not wait to see if the symptoms will go away. Get medical  help right away. Call your local emergency services (911 in the [place] Do not drive yourself to the hospital. If you have severe hypoglycemia and you cannot eat or drink, you may need an injection of glucagon. A family member or close friend should learn how to check your blood glucose and how to give you a glucagon injection. Ask your health care provider if you need to have an emergency glucagon injection kit available. Severe hypoglycemia may need to be treated in a hospital. The treatment may include getting glucose through an IV tube. You  may also need treatment for the cause of your hypoglycemia. Can having diabetes put me at risk for other conditions? Having diabetes can put you at risk for other long-term (chronic) conditions, such as heart disease and kidney disease. Your health care provider may prescribe medicines to help prevent complications from diabetes. These medicines may include:  Aspirin.  Medicine to lower cholesterol.  Medicine to control blood pressure.  What else can I do to manage my diabetes? Take your diabetes medicines as told  If your health care provider prescribed insulin or diabetes medicines, take them every day.  Do not run out of insulin or other diabetes medicines that you take. Plan ahead so you always have these available.  If you use insulin, adjust your dosage based on how physically active you are and what foods you eat. Your health care provider will tell you how to adjust your dosage. Make healthy food choices  The things that you eat and drink affect your blood glucose and your insulin dosage. Making good choices helps to control your diabetes and prevent other health problems. A healthy meal plan includes eating lean proteins, complex carbohydrates, fresh fruits and vegetables, low-fat dairy products, and healthy fats. Make an appointment to see a diet and nutrition specialist (registered dietitian) to help you create an eating plan that is right for  you. Make sure that you:  Follow instructions from your health care provider about eating or drinking restrictions.  Drink enough fluid to keep your urine clear or pale yellow.  Eat healthy snacks between nutritious meals.  Track the carbohydrates that you eat. Do this by reading food labels and learning the standard serving sizes of foods.  Follow your sick day plan whenever you cannot eat or drink as usual. Make this plan in advance with your health care provider.  Stay active  Exercise regularly, as told by your health care provider. This may include:  Stretching and doing strength exercises, such as yoga or weightlifting, at least 2 times a week.  Doing at least 150 minutes of moderate-intensity or vigorous-intensity exercise each week. This could be brisk walking, biking, or water aerobics. ? Spread out your activity over at least 3 days of the week. ? Do not go more than 2 days in a row without doing some kind of physical activity.  When you start a new exercise or activity, work with your health care provider to adjust your insulin, medicines, or food intake as needed. Make healthy lifestyle choices  Do not use any tobacco products, such as cigarettes, chewing tobacco, and e-cigarettes. If you need help quitting, ask your health care provider.  If your health care provider says that alcohol is safe for you, limit alcohol intake to no more than 1 drink per day for nonpregnant women and 2 drinks per day for men. One drink equals 12 oz of beer, 5 oz of wine, or 1 oz of hard liquor.  Learn to manage stress. If you need help with this, ask your health care provider. Care for your body   Keep your immunizations up to date. In addition to getting vaccinations as told by your health care provider, it is recommended that you get vaccinated against the following illnesses: ? The flu (influenza). Get a flu shot every year. ? Pneumonia. ? Hepatitis B.  Schedule an eye exam soon  after your diagnosis, and then one time every year after that.  Check your skin and feet every day for cuts, bruises, redness, blisters,  or sores. Schedule a foot exam with your health care provider once every year.  Brush your teeth and gums two times a day, and floss at least one time a day. Visit your dentist at least once every 6 months.  Maintain a healthy weight. General instructions  Take over-the-counter and prescription medicines only as told by your health care provider.  Share your diabetes management plan with people in your workplace, school, and household.  Check your urine for ketones when you are ill and as told by your health care provider.  Ask your health care provider: ? Do I need to meet with a diabetes educator? ? Where can I find a support group for people with diabetes?  Carry a medical alert card or wear medical alert jewelry.  Keep all follow-up visits as told by your health care provider. This is important. Where to find more information: For more information about diabetes, visit:  American Diabetes Association (ADA): www.diabetes.org  American Association of Diabetes Educators (AADE): www.diabeteseducator.org/patient-resources  This information is not intended to replace advice given to you by your health care provider. Make sure you discuss any questions you have with your health care provider. Document Released: 11/04/2015 Document Revised: 12/19/2015 Document Reviewed: 08/16/2015 Elsevier Interactive Patient Education  2017 [place].      Blood Glucose Monitoring, Adult Monitoring your blood sugar (glucose) helps you manage your diabetes. It also helps you and your health care provider determine how well your diabetes management plan is working. Blood glucose monitoring involves checking your blood glucose as often as directed, and keeping a record (log) of your results over time. Why should I monitor my blood glucose? Checking your blood  glucose regularly can:  Help you understand how food, exercise, illnesses, and medicines affect your blood glucose.  Let you know what your blood glucose is at any time. You can quickly tell if you are having low blood glucose (hypoglycemia) or high blood glucose (hyperglycemia).  Help you and your health care provider adjust your medicines as needed.  When should I check my blood glucose? Follow instructions from your health care provider about how often to check your blood glucose.   This may depend on:  The type of diabetes you have.  How well-controlled your diabetes is.  Medicines you are taking.  If you have type 1 diabetes:  Check your blood glucose at least 2 times a day.  Also check your blood glucose: ? Before every insulin injection. ? Before and after exercise. ? Between meals. ? 2 hours after a meal. ? Occasionally between 2:00 a.m. and 3:00 a.m., as directed. ? Before potentially dangerous tasks, like driving or using heavy machinery. ? At bedtime.  You may need to check your blood glucose more often, up to 6-10 times a day: ? If you use an insulin pump. ? If you need multiple daily injections (MDI). ? If your diabetes is not well-controlled. ? If you are ill. ? If you have a history of severe hypoglycemia. ? If you have a history of not knowing when your blood glucose is getting low (hypoglycemia unawareness).  If you have type 2 diabetes:  If you take insulin or other diabetes medicines, check your blood glucose at least 2 times a day.  If you are on intensive insulin therapy, check your blood glucose at least 4 times a day. Occasionally, you may also need to check between 2:00 a.m. and 3:00 a.m., as directed.  Also check your blood glucose: ?  Before and after exercise. ? Before potentially dangerous tasks, like driving or using heavy machinery.  You may need to check your blood glucose more often if: ? Your medicine is being adjusted. ? Your diabetes  is not well-controlled. ? You are ill.  What is a blood glucose log?  A blood glucose log is a record of your blood glucose readings. It helps you and your health care provider: ? Look for patterns in your blood glucose over time. ? Adjust your diabetes management plan as needed.  Every time you check your blood glucose, write down your result and notes about things that may be affecting your blood glucose, such as your diet and exercise for the day.  Most glucose meters store a record of glucose readings in the meter. Some meters allow you to download your records to a computer. How do I check my blood glucose? Follow these steps to get accurate readings of your blood glucose: Supplies needed   Blood glucose meter.  Test strips for your meter. Each meter has its own strips. You must use the strips that come with your meter.  A needle to prick your finger (lancet). Do not use lancets more than once.  A device that holds the lancet (lancing device).  A journal or log book to write down your results.  Procedure  Wash your hands with soap and water.  Prick the side of your finger (not the tip) with the lancet. Use a different finger each time.  Gently rub the finger until a small drop of blood appears.  Follow instructions that come with your meter for inserting the test strip, applying blood to the strip, and using your blood glucose meter.  Write down your result and any notes.  Alternative testing sites  Some meters allow you to use areas of your body other than your finger (alternative sites) to test your blood.  If you think you may have hypoglycemia, or if you have hypoglycemia unawareness, do not use alternative sites. Use your finger instead.  Alternative sites may not be as accurate as the fingers, because blood flow is slower in these areas. This means that the result you get may be delayed, and it may be different from the result that you would get from your  finger.  The most common alternative sites are: ? Forearm. ? Thigh. ? Palm of the hand.  Additional tips  Always keep your supplies with you.  If you have questions or need help, all blood glucose meters have a 24-hour "hotline" number that you can call. You may also contact your health care provider.  After you use a few boxes of test strips, adjust (calibrate) your blood glucose meter by following instructions that came with your meter.    The American Diabetes Association suggests the following targets for most nonpregnant adults with diabetes.  More or less stringent glycemic goals may be appropriate for each individual.  A1C: Less than 8% A1C may also be reported as eAG: Less than 154 mg/dl Before a meal (preprandial plasma glucose): 80-130 mg/dl 1-2 hours after beginning of the meal (Postprandial plasma glucose)*: Less than 180 mg/dl  *Postprandial glucose may be targeted if A1C goals are not met despite reaching preprandial glucose goals.   GOALS in short:  The goals are for the Hgb A1C to be less than 7.0 & blood pressure to be less than 130/80.    It is recommended that all diabetics are educated on and follow a healthy  diabetic diet, exercise for 30 minutes 3-4 times per week (walking, biking, swimming, or machine), monitor blood glucose readings and bring that record with you to be reviewed at your next office visit.     You should be checking fasting blood sugars- especially after you eat poorly or eat really healthy, and also check 2 hour postprandial blood sugars after largest meal of the day.    Write these down and bring in your log at each office visit.    You will need to be seen every 3 months by the provider managing your Diabetes unless told otherwise by that provider.   You will need yearly eye exams from an eye specialist and foot exams to check the nerves of your feet.  Also, your urine should be checked yearly as well to make sure excess protein is not  present.   If you are checking your blood pressure at home, please record it and bring it to your next office visit.    Follow the Dietary Approaches to Stop Hypertension (DASH) diet (3 servings of fruit and vegetables daily, whole grains, low sodium, low-fat proteins).  See below.    Lastly, when it comes to your cholesterol, the goal is to have the HDL (good cholesterol) >40, and the LDL (bad cholesterol) <100.   It is recommended that you follow a heart healthy, low saturated and trans-fat diet and exercise for 30 minutes at least 5 times a week.     (( Check out the DASH diet = 1.5 Gram Low Sodium Diet   A 1.5 gram sodium diet restricts the amount of sodium in the diet to no more than 1.5 g or 1500 mg daily.  The American Heart Association recommends Americans over the age of 29 to consume no more than 1500 mg of sodium each day to reduce the risk of developing high blood pressure.  Research also shows that limiting sodium may reduce heart attack and stroke risk.  Many foods contain sodium for flavor and sometimes as a preservative.  When the amount of sodium in a diet needs to be low, it is important to know what to look for when choosing foods and drinks.  The following includes some information and guidelines to help make it easier for you to adapt to a low sodium diet.    QUICK TIPS  Do not add salt to food.  Avoid convenience items and fast food.  Choose unsalted snack foods.  Buy lower sodium products, often labeled as "lower sodium" or "no salt added."  Check food labels to learn how much sodium is in 1 serving.  When eating at a restaurant, ask that your food be prepared with less salt or none, if possible.    READING FOOD LABELS FOR SODIUM INFORMATION  The nutrition facts label is a good place to find how much sodium is in foods. Look for products with no more than 400 mg of sodium per serving.  Remember that 1.5 g = 1500 mg.  The food label may also list foods as:   Sodium-free: Less than 5 mg in a serving.  Very low sodium: 35 mg or less in a serving.  Low-sodium: 140 mg or less in a serving.  Light in sodium: 50% less sodium in a serving. For example, if a food that usually has 300 mg of sodium is changed to become light in sodium, it will have 150 mg of sodium.  Reduced sodium: 25% less sodium in a serving. For  example, if a food that usually has 400 mg of sodium is changed to reduced sodium, it will have 300 mg of sodium.    CHOOSING FOODS  Grains  Avoid: Salted crackers and snack items. Some cereals, including instant hot cereals. Bread stuffing and biscuit mixes. Seasoned rice or pasta mixes.  Choose: Unsalted snack items. Low-sodium cereals, oats, puffed wheat and rice, shredded wheat. English muffins and bread. Pasta.  Meats  Avoid: Salted, canned, smoked, spiced, pickled meats, including fish and poultry. Bacon, ham, sausage, cold cuts, hot dogs, anchovies.  Choose: Low-sodium canned tuna and salmon. Fresh or frozen meat, poultry, and fish.  Dairy  Avoid: Processed cheese and spreads. Cottage cheese. Buttermilk and condensed milk. Regular cheese.  Choose: Milk. Low-sodium cottage cheese. Yogurt. Sour cream. Low-sodium cheese.  Fruits and Vegetables  Avoid: Regular canned vegetables. Regular canned tomato sauce and paste. Frozen vegetables in sauces. Olives. Angie Fava. Relishes. Sauerkraut.  Choose: Low-sodium canned vegetables. Low-sodium tomato sauce and paste. Frozen or fresh vegetables. Fresh and frozen fruit.  Condiments  Avoid: Canned and packaged gravies. Worcestershire sauce. Tartar sauce. Barbecue sauce. Soy sauce. Steak sauce. Ketchup. Onion, garlic, and table salt. Meat flavorings and tenderizers.  Choose: Fresh and dried herbs and spices. Low-sodium varieties of mustard and ketchup. Lemon juice. Tabasco sauce. Horseradish.    SAMPLE 1.5 GRAM SODIUM MEAL PLAN:   Breakfast / Sodium (mg)  1 cup low-fat milk / 143 mg  1 whole-wheat  English muffin / 240 mg  1 tbs heart-healthy margarine / 153 mg  1 hard-boiled egg / 139 mg  1 small orange / 0 mg  Lunch / Sodium (mg)  1 cup raw carrots / 76 mg  2 tbs no salt added peanut butter / 5 mg  2 slices whole-wheat bread / 270 mg  1 tbs jelly / 6 mg   cup red grapes / 2 mg  Dinner / Sodium (mg)  1 cup whole-wheat pasta / 2 mg  1 cup low-sodium tomato sauce / 73 mg  3 oz lean ground beef / 57 mg  1 small side salad (1 cup raw spinach leaves,  cup cucumber,  cup yellow bell pepper) with 1 tsp olive oil and 1 tsp red wine vinegar / 25 mg  Snack / Sodium (mg)  1 container low-fat vanilla yogurt / 107 mg  3 graham cracker squares / 127 mg  Nutrient Analysis  Calories: 1745  Protein: 75 g  Carbohydrate: 237 g  Fat: 57 g  Sodium: 1425 mg  Document Released: 07/13/2005 Document Revised: 03/25/2011 Document Reviewed: 10/14/2009  ExitCare Patient Information 2012 [place].))    This information is not intended to replace advice given to you by your health care provider. Make sure you discuss any questions you have with your health care provider. Document Released: 07/16/2003 Document Revised: 01/31/2016 Document Reviewed: 12/23/2015 Elsevier Interactive Patient Education  2017 [employer].

## 2017-06-11 NOTE — Progress Notes (Signed)
Assessment   1. Diabetes mellitus due to underlying condition with microalbuminuria, with long-term current use of insulin ([place])   2. Hypertension associated with diabetes ([place])   3. Hyperlipidemia associated with type 2 diabetes mellitus ([place])   4. Strain of latissimus dorsi muscle, initial encounter   5. Stress and adjustment reaction   6. Type 2 diabetes mellitus with stage 3 chronic kidney disease, with long-term current use of insulin ([facility])      Since A1c is 8.1- pt does not want to add another medicine-Per patient she will work on diet and exercise.  She will be better with what she eats and knows she has not been eating well.  If by next time in 3 months her A1c is not in the seven-point something range, she knows and agrees she will add another medicine.  She has a follow-up with her cardiologist next couple of weeks and after she obtains blood work with them, she will follow-up with [place] in a couple months and we will obtain any extra blood work that they did not get in the near future.  Diabetic education: ongoing education regarding chronic disease management for diabetes was given today. We continue to reinforce the ABC's of diabetic management: A1c (<7 or 8 dependent upon patient), tight blood pressure control, and cholesterol management with goal LDL < 100 minimally. We discuss diet strategies, exercise recommendations, medication options and possible side effects. At each visit, we review recommended immunizations and preventive care recommendations for diabetics and stress that good diabetic control can prevent other problems. See below for this patient's data.  Return in about 3 months (around 09/11/2017) for inc tresiba from 18 to 20units.  Check thyroid if cards does not next appointment..   Commons side effects, risks, benefits, and alternatives for medications and treatment plan prescribed today were discussed, and the patient expressed understanding of the given instructions.  Patient is instructed to call or message via MyChart if he/she has any questions or concerns regarding our treatment plan. No barriers to understanding were identified. We discussed Red Flag symptoms and signs in detail. Patient expressed understanding regarding what to do in case of urgent or emergency type symptoms.   Medication list was reconciled, printed and provided to the patient in AVS. Patient instructions and summary information was reviewed with the patient as documented in the AVS. This note was prepared with assistance of Dragon voice recognition software. Occasional wrong-word or sound-a-like substitutions may have occurred due to the inherent limitations of voice recognition software  Orders Placed This Encounter  Procedures  . POCT glycosylated hemoglobin (Hb A1C)   Current Meds  Medication Sig  . aspirin 81 MG tablet Take 81 mg by mouth daily.  . calcium carbonate (CALCIUM 600) 1500 (600 Ca) MG TABS tablet Take 600 mg of elemental calcium by mouth daily with breakfast.  . Coenzyme Q10 200 MG TABS Take 1 tablet by mouth daily.  . Cyanocobalamin (B-12) 2500 MCG TABS Take 1 tablet by mouth daily.  . insulin degludec (TRESIBA) 100 UNIT/ML SOPN FlexTouch Pen Inject 0.2 mLs (20 Units total) at bedtime into the skin. Check FBS and 2 hr PP- log them  . loperamide (IMODIUM) 2 MG capsule Take by mouth as needed for diarrhea or loose stools.  Marland Kitchen losartan (COZAAR) 25 MG tablet Take 1 tablet (25 mg total) every morning by mouth.  . metoprolol tartrate (LOPRESSOR) 50 MG tablet TAKE 1 TABLET BY MOUTH 2 TIMES DAILY.  . Multiple Vitamin (MULTIVITAMIN) tablet Take  1 tablet by mouth daily.  . rosuvastatin (CRESTOR) 20 MG tablet TAKE 1 TABLET BY MOUTH DAILY.  Marland Kitchen S-Adenosylmethionine (SAM-E) 400 MG TABS Take 1 tablet by mouth daily.  Marland Kitchen SYNTHROID 150 MCG tablet Take 1 tablet (150 mcg total) by mouth daily before breakfast.  . TRUETRACK TEST test strip Check your blood sugar every morning and 2-hour  postprandial after the largest meal of the day  . vitamin C (ASCORBIC ACID) 500 MG tablet Take 500 mg by mouth daily.  Marland Kitchen VITAMIN E PO Take 500 mg by mouth daily.  . [DISCONTINUED] HYDROcodone-acetaminophen (NORCO) 5-325 MG tablet Take 1 tablet by mouth every 6 (six) hours as needed. (Patient not taking: Reported on 06/24/2017)  . [DISCONTINUED] insulin degludec (TRESIBA) 100 UNIT/ML SOPN FlexTouch Pen Inject 0.18 mLs (18 Units total) into the skin at bedtime. Check FBS and 2 hr PP- log them  . [DISCONTINUED] losartan (COZAAR) 25 MG tablet TAKE 1 TABLET BY MOUTH EVERY MORNING.  . [DISCONTINUED] potassium chloride SA (K-DUR,KLOR-CON) 20 MEQ tablet TAKE 1 TABLET BY MOUTH DAILY.  . [DISCONTINUED] TRUETRACK TEST test strip         Subjective   CC:  Chief Complaint  Patient presents with  . Follow-up    HPI: Alicia Medina is a 83 y.o. female who presents to the office today for follow up of diabetes and problems listed above in the chief complaint.   March 19, 2017  Lanier Prude D, CMA  to Me   Note    Spoke to the patient she states that she is check glucose in the mornings (fasting) - this week her readings were 147,137,158, and 114.  Patient is not check after largest meal.  Per Dr. Raliegh Scarlet informed her that we will change her to Nanine Means and that she needs to check in the morning and largest meal of the day.  Patient expressed understanding. MPulliam, CMA/RT(R)      102-141 FBS.   Since to see both been in the 120s-130s fasting.  She does not check 2-hour postprandial.  Not doing so well with her diet.  More stress in life- with sons and husband's health.   Patient states she started her Tyler Aas  which is a new medicine for her by the end of August.  One year ago in June 04, 2016 her A1c was 7.4.     7.7 was her A1c in February 2018, A1c of 7.5 in Dec 01, 2016 also then in 03/16/2017 was 9.2.   Her diabetic control is reported as Improved. Better FBS.  She denies exertional  CP or SOB or symptomatic hypoglycemia. She denies foot sores.   Patient sees a Restaurant manager, fast food on a regular basis.  He does some adjustments on her from time to time.  It is high velocity low amplitude.  She has been having some muscle tension and tightness in her latissimus dorsi on the right all the way from the posterior occiput down through the latissimus dorsi and lateral aspect of her thoracic paravertebral spinal region.  It is tight and hard to turn her head to the right.  She is taken Tylenol for pain.   I reviewed the patients updated PMH, FH, and SocHx.  Patient Active Problem List   Diagnosis Date Noted  . 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 Hypertension associated with diabetes ([place]) 09/27/2014    Priority: High  . Hyperlipidemia  associated with type 2 diabetes mellitus ([place]) 11/12/2011    Priority: High  . Old inferior wall myocardial infarction 08/29/2011    Priority: High  . Hemorrhoids, internal, with bleeding 05/08/2013    Priority: Medium  . Stress and adjustment reaction 06/11/2017  . Strain of latissimus dorsi muscle 06/11/2017  . History of vitamin D deficiency 03/11/2017  . Other fatigue 03/11/2017  . Abscess 09/03/2016  . Hypothyroidism, postsurgical 07/22/2016  . Coronary artery disease 11/12/2011   Immunization History  Administered Date(s) Administered  . Influenza-Unspecified 06/05/2016   Health Maintenance  Topic Date Due  . INFLUENZA VACCINE  11/04/2017 (Originally 02/24/2017)  . OPHTHALMOLOGY EXAM  03/11/2018 (Originally 01/17/1943)  . TETANUS/TDAP  03/11/2018 (Originally 01/17/1952)  . PNA vac Low Risk Adult (1 of 2 - PCV13) 06/11/2018 (Originally 01/18/2000)  . HEMOGLOBIN A1C  12/19/2017  . FOOT EXAM  09/13/2018  . DEXA SCAN  Completed   Diabetes Related Lab Review: Lab Results  Component Value Date   HGBA1C 8.2 (H) 06/21/2017   Lab Results   Component Value Date   MICROALBUR 30 09/03/2016   Lab Results  Component Value Date   CREATININE 1.01 (H) 06/21/2017   BUN 15 06/21/2017   NA 143 06/21/2017   K 5.1 06/21/2017   CL 104 06/21/2017   CO2 26 06/21/2017   Lab Results  Component Value Date   CHOL 122 06/21/2017   CHOL 120 12/10/2016   CHOL 119 06/04/2016   Lab Results  Component Value Date   HDL 45 06/21/2017   HDL 46 (L) 12/10/2016   HDL 34 (L) 06/04/2016   Lab Results  Component Value Date   LDLCALC 60 06/21/2017   LDLCALC 48 12/10/2016   LDLCALC 63 06/04/2016   Lab Results  Component Value Date   TRIG 85 06/21/2017   TRIG 131 12/10/2016   TRIG 110 06/04/2016   Lab Results  Component Value Date   CHOLHDL 2.7 06/21/2017   CHOLHDL 2.6 12/10/2016   CHOLHDL 3.5 06/04/2016   No results found for: LDLDIRECT The ASCVD Risk score Mikey Bussing DC Jr., et al., 2013) failed to calculate for the following reasons:   The 2013 ASCVD risk score is only valid for ages 68 to 34   The patient has a prior MI or stroke diagnosis   Review of Systems: Ophthalmic: negative for eye pain, loss of vision or double vision Cardiovascular: negative for chest pain Respiratory: negative for SOB or persistent cough Gastrointestinal: negative for abdominal pain Genitourinary: negative for dysuria or gross hematuria MSK: negative for foot lesions Neurologic: negative for weakness or gait disturbance  Objective  Vitals: BP 130/74   Pulse 72   Ht 5\' 5"  (1.651 m)   Wt 161 lb 3.2 oz (73.1 kg)   BMI 26.83 kg/m  General: well appearing, no acute distress  Psych:  Alert and oriented, normal mood and affect HEENT:  Normocephalic, atraumatic, moist mucous membranes, supple neck  Cardiovascular:  Nl S1 and S2, RRR without murmur, gallop or rub. no edema Respiratory:  Good breath sounds bilaterally, CTAB with normal effort, no rales Gastrointestinal: normal BS, soft, nontender Skin:  Warm, no rashes Neurologic:   Mental status is  normal. normal gait Foot exam: no erythema, pallor, or cyanosis visible nl proprioception and sensation to monofilament testing bilaterally, +2 distal pulses bilaterally

## 2017-06-21 ENCOUNTER — Telehealth: Payer: Self-pay | Admitting: Cardiology

## 2017-06-21 DIAGNOSIS — I2510 Atherosclerotic heart disease of native coronary artery without angina pectoris: Secondary | ICD-10-CM | POA: Diagnosis not present

## 2017-06-21 DIAGNOSIS — E785 Hyperlipidemia, unspecified: Secondary | ICD-10-CM | POA: Diagnosis not present

## 2017-06-21 DIAGNOSIS — E1159 Type 2 diabetes mellitus with other circulatory complications: Secondary | ICD-10-CM

## 2017-06-21 DIAGNOSIS — E1169 Type 2 diabetes mellitus with other specified complication: Secondary | ICD-10-CM

## 2017-06-21 DIAGNOSIS — I10 Essential (primary) hypertension: Secondary | ICD-10-CM

## 2017-06-21 NOTE — Telephone Encounter (Signed)
Returned the call to the patient. She stated that she would like to come in today to get her fasting labs completed for her appointment on 11/29. The orders have been placed. The patient verbalized her understanding.

## 2017-06-21 NOTE — Telephone Encounter (Signed)
Patient calling, states that she would like to have lab completed today and would like lab orders sent.

## 2017-06-21 NOTE — Progress Notes (Signed)
Alicia Medina Date of Birth: 1933/03/20 Medical Record #[id number]  History of Present Illness: Alicia Medina is seen today for follow up CAD. She has had an inferior MI treated with BMS to the RCA in February 2013. She required defibrillation multiple times and her hospitalization was complicated by cardiogenic shock, VF, VDRF and CHB requiring a temporary pacemaker. She had abrupt occlusion of the RCA stent that was treated with a 2nd BMS to the RCA.Followup nuclear stress testing in March 2013 showed normal perfusion throughout with ejection fraction of 76%.  In September she underwent tendon release on her right ring and small finger.   On followup today she is doing very well. She denies any chest pain or SOB. No palpitations. Reports BS doing Ok at home. Since stopping metformin her GI issues have resolved.   Current Outpatient Medications on File Prior to Visit  Medication Sig Dispense Refill  . aspirin 81 MG tablet Take 81 mg by mouth daily.    . calcium carbonate (CALCIUM 600) 1500 (600 Ca) MG TABS tablet Take 600 mg of elemental calcium by mouth daily with breakfast.    . Coenzyme Q10 200 MG TABS Take 1 tablet by mouth daily.    . Cyanocobalamin (B-12) 2500 MCG TABS Take 1 tablet by mouth daily.    . insulin degludec (TRESIBA) 100 UNIT/ML SOPN FlexTouch Pen Inject 0.2 mLs (20 Units total) at bedtime into the skin. Check FBS and 2 hr PP- log them 5 pen 3  . loperamide (IMODIUM) 2 MG capsule Take by mouth as needed for diarrhea or loose stools.    Marland Kitchen losartan (COZAAR) 25 MG tablet Take 1 tablet (25 mg total) every morning by mouth. 90 tablet 3  . metoprolol tartrate (LOPRESSOR) 50 MG tablet TAKE 1 TABLET BY MOUTH 2 TIMES DAILY. 180 tablet 2  . Multiple Vitamin (MULTIVITAMIN) tablet Take 1 tablet by mouth daily.    . potassium chloride SA (K-DUR,KLOR-CON) 20 MEQ tablet TAKE 1 TABLET BY MOUTH DAILY. 90 tablet 2  . rosuvastatin (CRESTOR) 20 MG tablet TAKE 1 TABLET BY MOUTH DAILY. 90 tablet  2  . S-Adenosylmethionine (SAM-E) 400 MG TABS Take 1 tablet by mouth daily.    Marland Kitchen SYNTHROID 150 MCG tablet Take 1 tablet (150 mcg total) by mouth daily before breakfast. 30 tablet 6  . TRUETRACK TEST test strip Check your blood sugar every morning and 2-hour postprandial after the largest meal of the day 180 each 5  . vitamin C (ASCORBIC ACID) 500 MG tablet Take 500 mg by mouth daily.    Marland Kitchen VITAMIN E PO Take 500 mg by mouth daily.     No current facility-administered medications on file prior to visit.     Allergies  Allergen Reactions  . Demeclocycline Other (See Comments)    Severe stomach cramps  . Lisinopril Cough  . Tetracyclines & Related Other (See Comments)    Severe stomach cramps    Past Medical History:  Diagnosis Date  . Arthritis   . CAD (coronary artery disease) CARDIOLOGIST-  DR Martinique   08-28-2011 Inferior STEMI with BMS to the RCA, complicated by cardiogenic shock, VF, VDRF , temporary CHB and abrupt reocclusion of the RCA stent treated with a second BMS to the RCA. Has residual LAD disease. EF is 50%  . Diverticulosis of colon   . Hemorrhoids, internal, with bleeding   . Hyperlipidemia   . Hypertension   . Hypothyroidism, postsurgical   . S/p bare metal coronary artery  stent    2013  . Type 2 diabetes mellitus ([place])   . Wears hearing aid    both ears    Past Surgical History:  Procedure Laterality Date  . CARDIOVASCULAR STRESS TEST  10-03-2011  dr Martinique   normal perfusion / no ischemia/ ef 77%  . CARPAL TUNNEL RELEASE Right 01/25/2013   Procedure: CARPAL TUNNEL RELEASE;  Surgeon: Wynonia Sours, MD;  Location: [facility];  Service: Orthopedics;  Laterality: Right;  . CARPAL TUNNEL RELEASE Left 10/25/2013   Procedure: LEFT CARPAL TUNNEL RELEASE;  Surgeon: Wynonia Sours, MD;  Location: [facility];  Service: Orthopedics;  Laterality: Left;  . CATARACT EXTRACTION W/ INTRAOCULAR LENS  IMPLANT, BILATERAL  2007  . COLONOSCOPY  04-27-2003   . CORONARY ANGIOGRAM Left 08/28/2011   Procedure: CORONARY ANGIOGRAM;  Surgeon: Peter M Martinique, MD;  Location: [facility] CATH LAB;  Service: Cardiovascular;  Laterality: Left;  . CORONARY ANGIOPLASTY WITH STENT PLACEMENT  08-28-2011  dr Martinique   BMS to [facility] with abrupt reocclusion treated with second BMS/  LAD ostial 90%, proximal50-60%, first diagonal 80-90%  . LEFT HEART CATHETERIZATION WITH CORONARY ANGIOGRAM N/A 08/28/2011   Procedure: LEFT HEART CATHETERIZATION WITH CORONARY ANGIOGRAM;  Surgeon: Peter M Martinique, MD;  Location: [facility] CATH LAB;  Service: Cardiovascular;  Laterality: N/A;  . PERCUTANEOUS CORONARY STENT INTERVENTION (PCI-S) Left 08/28/2011   Procedure: PERCUTANEOUS CORONARY STENT INTERVENTION (PCI-S);  Surgeon: Peter M Martinique, MD;  Location: [facility] CATH LAB;  Service: Cardiovascular;  Laterality: Left;  . TOTAL THYROIDECTOMY Bilateral 05-02-2007   multinodular goiter  . TRANSANAL HEMORRHOIDAL DEARTERIALIZATION N/A 03/28/2014   Procedure: TRANSANAL HEMORRHOIDAL DEARTERIALIZATION;  Surgeon: Leighton Ruff, MD;  Location: [facility];  Service: General;  Laterality: N/A;  . TRANSTHORACIC ECHOCARDIOGRAM  08-30-2011   mild LVH/ ef 27-03%/  grade I diastolic dysfunction  . TRIGGER FINGER RELEASE Right 01/25/2013   Procedure: RELEASE TRIGGER FINGER/A-1 PULLEY RIGHT INDEX FINGER;  Surgeon: Wynonia Sours, MD;  Location: [place];  Service: Orthopedics;  Laterality: Right;  . TRIGGER FINGER RELEASE Left 10/25/2013   Procedure: RELEASE A-1 PULLEY LEFT MIDDLE FINGER;  Surgeon: Wynonia Sours, MD;  Location: [place];  Service: Orthopedics;  Laterality: Left;  . TRIGGER FINGER RELEASE Left 12/04/2014   Procedure: RELEASE TRIGGER FINGER/A-1 PULLEY LEFT RING FINGER;  Surgeon: Daryll Brod, MD;  Location: [place];  Service: Orthopedics;  Laterality: Left;  . TRIGGER FINGER RELEASE Right 03/30/2017   Procedure: RELEASE TRIGGER FINGER/A-1 PULLEY RIGHT RING FINGER  AND RIGHT SMALL FINGER;  Surgeon: Daryll Brod, MD;  Location: [place];  Service: Orthopedics;  Laterality: Right;    Social History   Tobacco Use  Smoking Status Never Smoker  Smokeless Tobacco Never Used    Social History   Substance and Sexual Activity  Alcohol Use Yes  . Alcohol/week: 0.6 oz  . Types: 1 Glasses of wine per week   Comment: social    Family History  Problem Relation Age of Onset  . Stroke Mother   . Cancer Mother        breast  . Cancer Father        lung  . Stroke Father     Review of Systems: The review of systems is per the HPI.  All other systems were reviewed and are negative.  Physical Exam: BP 121/65   Pulse 62   Ht 5\' 5"  (1.651 m)  Wt 160 lb 3.2 oz (72.7 kg)   SpO2 99%   BMI 26.66 kg/m  GENERAL:  Well appearing WF HEENT:  PERRL, EOMI, sclera are clear. Oropharynx is clear. NECK:  No jugular venous distention, carotid upstroke brisk and symmetric, no bruits, no thyromegaly or adenopathy LUNGS:  Clear to auscultation bilaterally CHEST:  Unremarkable HEART:  RRR,  PMI not displaced or sustained,S1 and S2 within normal limits, no S3, no S4: no clicks, no rubs, no murmurs ABD:  Soft, nontender. BS +, no masses or bruits. No hepatomegaly, no splenomegaly EXT:  2 + pulses throughout, no edema, no cyanosis no clubbing SKIN:  Warm and dry.  No rashes NEURO:  Alert and oriented x 3. Cranial nerves II through XII intact. PSYCH:  Cognitively intact    LABORATORY DATA: Lab Results  Component Value Date   WBC 5.6 03/16/2017   HGB 11.8 03/16/2017   HCT 37.4 03/16/2017   PLT 333 03/16/2017   GLUCOSE 95 06/21/2017   CHOL 122 06/21/2017   TRIG 85 06/21/2017   HDL 45 06/21/2017   LDLCALC 60 06/21/2017   ALT 19 06/21/2017   AST 21 06/21/2017   NA 143 06/21/2017   K 5.1 06/21/2017   CL 104 06/21/2017   CREATININE 1.01 (H) 06/21/2017   BUN 15 06/21/2017   CO2 26 06/21/2017   TSH 0.296 (L) 03/16/2017   INR 1.31  09/01/2011   HGBA1C 8.2 (H) 06/21/2017   MICROALBUR 30 09/03/2016    Assessment / Plan: 1. Status post acute inferior myocardial infarction in February 2013 with multiple complications. She continues to do very well and is asymptomatic.  Followup nuclear stress test March 2013 was normal.  Continue ASA.  2. Hyperlipidemia, excellent control on Crestor.  3. Hypertension, well controlled.  4. Diabetes mellitus type 2  A1c 8.2%.  Now off metformin due to GI distress. Per primary care.

## 2017-06-22 LAB — HEPATIC FUNCTION PANEL
ALBUMIN: 4.3 g/dL (ref 3.5–4.7)
ALT: 19 IU/L (ref 0–32)
AST: 21 IU/L (ref 0–40)
Alkaline Phosphatase: 48 IU/L (ref 39–117)
BILIRUBIN TOTAL: 0.3 mg/dL (ref 0.0–1.2)
Bilirubin, Direct: 0.12 mg/dL (ref 0.00–0.40)
TOTAL PROTEIN: 7.4 g/dL (ref 6.0–8.5)

## 2017-06-22 LAB — BASIC METABOLIC PANEL
BUN / CREAT RATIO: 15 (ref 12–28)
BUN: 15 mg/dL (ref 8–27)
CO2: 26 mmol/L (ref 20–29)
CREATININE: 1.01 mg/dL — AB (ref 0.57–1.00)
Calcium: 9.8 mg/dL (ref 8.7–10.3)
Chloride: 104 mmol/L (ref 96–106)
GFR, EST AFRICAN AMERICAN: 60 mL/min/{1.73_m2} (ref >59)
GFR, EST NON AFRICAN AMERICAN: 52 mL/min/{1.73_m2} — AB (ref >59)
Glucose: 95 mg/dL (ref 65–99)
Potassium: 5.1 mmol/L (ref 3.5–5.2)
SODIUM: 143 mmol/L (ref 134–144)

## 2017-06-22 LAB — LIPID PANEL
CHOL/HDL RATIO: 2.7 ratio (ref 0.0–4.4)
CHOLESTEROL TOTAL: 122 mg/dL (ref 100–199)
HDL: 45 mg/dL (ref >39)
LDL CALC: 60 mg/dL (ref 0–99)
Triglycerides: 85 mg/dL (ref 0–149)
VLDL CHOLESTEROL CAL: 17 mg/dL (ref 5–40)

## 2017-06-22 LAB — HEMOGLOBIN A1C
ESTIMATED AVERAGE GLUCOSE: 189 mg/dL
Hgb A1c MFr Bld: 8.2 % — ABNORMAL HIGH (ref 4.8–5.6)

## 2017-06-24 ENCOUNTER — Encounter: Payer: Self-pay | Admitting: Cardiology

## 2017-06-24 ENCOUNTER — Ambulatory Visit (INDEPENDENT_AMBULATORY_CARE_PROVIDER_SITE_OTHER): Payer: Medicare Other | Admitting: Cardiology

## 2017-06-24 VITALS — BP 121/65 | HR 62 | Ht 65.0 in | Wt 160.2 lb

## 2017-06-24 DIAGNOSIS — I2510 Atherosclerotic heart disease of native coronary artery without angina pectoris: Secondary | ICD-10-CM

## 2017-06-24 DIAGNOSIS — I252 Old myocardial infarction: Secondary | ICD-10-CM | POA: Diagnosis not present

## 2017-06-24 DIAGNOSIS — E785 Hyperlipidemia, unspecified: Secondary | ICD-10-CM | POA: Diagnosis not present

## 2017-06-24 DIAGNOSIS — I10 Essential (primary) hypertension: Secondary | ICD-10-CM

## 2017-06-24 NOTE — Patient Instructions (Addendum)
Continue your current therapy  I will see you in 6 months.   

## 2017-07-13 ENCOUNTER — Telehealth: Payer: Self-pay | Admitting: Family Medicine

## 2017-07-13 ENCOUNTER — Other Ambulatory Visit: Payer: Self-pay | Admitting: Adult Health

## 2017-07-13 ENCOUNTER — Other Ambulatory Visit: Payer: Self-pay | Admitting: Cardiology

## 2017-07-13 NOTE — Telephone Encounter (Signed)
Please advise. Thank you MPulliam, CMA/RT(R)  

## 2017-07-13 NOTE — Telephone Encounter (Signed)
Okay for 5 mg 3 times daily as needed for muscle spasm, neck pain etc.  Will need follow-up with me in 2-4 weeks to assess how this treatment plan is doing and see if need additional meds or change in therapy.  No refills will be given unless we have discussed this with her

## 2017-07-13 NOTE — Telephone Encounter (Signed)
Patient saw Dr. Jenetta Downer in the recent past and they talked about getting her something for her head and neck pain. She wants to take her up on the offer after the chiropractor didn't do anything to help her. She states she doesn't want anything strong enough to keep her from driving. Please advise and if approved please send to [facility].

## 2017-07-14 ENCOUNTER — Other Ambulatory Visit: Payer: Self-pay

## 2017-07-14 ENCOUNTER — Telehealth: Payer: Self-pay | Admitting: Family Medicine

## 2017-07-14 MED ORDER — CYCLOBENZAPRINE HCL 5 MG PO TABS: 5.0000 mg | ORAL_TABLET | Freq: Three times a day (TID) | ORAL | 0 refills | Status: DC | PRN

## 2017-07-14 NOTE — Progress Notes (Unsigned)
Per patient's request for muscle relaxer Dr. Raliegh Scarlet approved Flexeril 5 mg 3 times daily as needed for muscle spasm, neck pain etc.  Patient will need follow-up in the office  in 2-4 weeks to assess how this treatment plan is doing and see if she needs additional meds or change in therapy. Per Dr. Raliegh Scarlet no refills will be given unless in office discussion of medications are done with the patient. MPulliam, CMA/RT(R)

## 2017-07-14 NOTE — Telephone Encounter (Signed)
Medication sent per Dr. Raliegh Scarlet, note added to chart sent to provider to sign off. MPulliam, CMA/RT(R)

## 2017-07-14 NOTE — Telephone Encounter (Signed)
Patient called back but you were unavailable, they are leaving for another appt and said to try them back late this afternoon (after 4:00p)

## 2017-07-15 ENCOUNTER — Telehealth: Payer: Self-pay | Admitting: Family Medicine

## 2017-07-15 NOTE — Telephone Encounter (Signed)
Called patient and notified her that medication was sent to pharmacy and to follow up 2-4 wks per Dr. Raliegh Scarlet.  Patient is going out of town for Christmas and will call when she returns to make appointment. MPulliam, CMA/RT(R)

## 2017-07-15 NOTE — Telephone Encounter (Signed)
Called patient notified her of pervious message in the chart. MPulliam, CMA/RT(R)

## 2017-07-15 NOTE — Telephone Encounter (Signed)
Patient states she rcvd a call from Dr. Raliegh Scarlet but was in a diff doctor's office and could not talk, attemped to call PCP back but it was after 5pm on 07/14/17---Pt is going out of town till after New year please call them--glh

## 2017-08-02 DIAGNOSIS — N8189 Other female genital prolapse: Secondary | ICD-10-CM | POA: Diagnosis not present

## 2017-08-12 ENCOUNTER — Other Ambulatory Visit: Payer: Self-pay | Admitting: Family Medicine

## 2017-08-16 DIAGNOSIS — N8189 Other female genital prolapse: Secondary | ICD-10-CM | POA: Diagnosis not present

## 2017-09-13 ENCOUNTER — Ambulatory Visit (INDEPENDENT_AMBULATORY_CARE_PROVIDER_SITE_OTHER): Payer: Medicare Other | Admitting: Family Medicine

## 2017-09-13 ENCOUNTER — Encounter: Payer: Self-pay | Admitting: Family Medicine

## 2017-09-13 VITALS — BP 138/72 | HR 75 | Ht 65.0 in | Wt 166.0 lb

## 2017-09-13 DIAGNOSIS — E1169 Type 2 diabetes mellitus with other specified complication: Secondary | ICD-10-CM

## 2017-09-13 DIAGNOSIS — R809 Proteinuria, unspecified: Secondary | ICD-10-CM | POA: Diagnosis not present

## 2017-09-13 DIAGNOSIS — I152 Hypertension secondary to endocrine disorders: Secondary | ICD-10-CM

## 2017-09-13 DIAGNOSIS — Z9889 Other specified postprocedural states: Secondary | ICD-10-CM

## 2017-09-13 DIAGNOSIS — E1159 Type 2 diabetes mellitus with other circulatory complications: Secondary | ICD-10-CM

## 2017-09-13 DIAGNOSIS — Z9111 Patient's noncompliance with dietary regimen: Secondary | ICD-10-CM

## 2017-09-13 DIAGNOSIS — Z91199 Patient's noncompliance with other medical treatment and regimen due to unspecified reason: Secondary | ICD-10-CM | POA: Insufficient documentation

## 2017-09-13 DIAGNOSIS — E785 Hyperlipidemia, unspecified: Secondary | ICD-10-CM | POA: Diagnosis not present

## 2017-09-13 DIAGNOSIS — I252 Old myocardial infarction: Secondary | ICD-10-CM

## 2017-09-13 DIAGNOSIS — I10 Essential (primary) hypertension: Secondary | ICD-10-CM | POA: Diagnosis not present

## 2017-09-13 DIAGNOSIS — Z794 Long term (current) use of insulin: Secondary | ICD-10-CM | POA: Diagnosis not present

## 2017-09-13 DIAGNOSIS — E0829 Diabetes mellitus due to underlying condition with other diabetic kidney complication: Secondary | ICD-10-CM

## 2017-09-13 DIAGNOSIS — Z9114 Patient's other noncompliance with medication regimen: Secondary | ICD-10-CM | POA: Diagnosis not present

## 2017-09-13 DIAGNOSIS — E890 Postprocedural hypothyroidism: Secondary | ICD-10-CM

## 2017-09-13 DIAGNOSIS — Z9009 Acquired absence of other part of head and neck: Secondary | ICD-10-CM

## 2017-09-13 LAB — POCT GLYCOSYLATED HEMOGLOBIN (HGB A1C): HEMOGLOBIN A1C: 8.3

## 2017-09-13 NOTE — Progress Notes (Addendum)
Impression and Recommendations:    1. 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 Hypertension associated with diabetes ([place])   3. Hyperlipidemia associated with type 2 diabetes mellitus (HCC)   4. Old inferior wall myocardial infarction   5. Hx of thyroidectomy; now hypothyroidism   6. Noncompliance with diet and medication regimen    1. Diabetes Mellitus - Last appointment, the patient went up from 18 to 20 on the Tresiba. - Patient formerly agreed that if her A1C is not in the 7's, she will consider additional medicine. - 3 months ago, A1C was 8.2; today it's 8.3. - Patient will follow-up for further disease management. - Will continue to monitor.  2. Referral to endocrinology - With her history of other issues, highly recommend the patient to visit endocrinology. - Historically visited endocrinology, but many years have passed.  Believes last visit was 2008. - Patient is willing for referral, but reports having miserable luck with endocrinology, historically. - History of total thyroidectomy.  - Follow up with endocrinology for further diabetes disease and medication management. - Future management of diabetes and medication will be through endocrinology.  3. HTN - Blood pressure is extremely high today. BP Readings from Last 3 Encounters:  09/13/17 (!) 165/88  06/24/17 121/65  06/11/17 130/74   RE-CHECK IN OFFICE USING MANUAL CUFF: 138/72 - Patient reports good compliance with her medicine. - Reviewed the importance of measuring and watching her blood pressure regularly at home. - Patient knows to check her blood pressure at home, keep log, and bring next office visit.  - Will continue to monitor. - Continue on medications as prescribed.  4. Follow-Up - Prior to next office visit in 3 months, check BMP, TSH, T3, T4, and A1C. - Will check kidney function, thyroid. - 1 week after labs are  obtained, follow up on diabetes,  - Reviewed the importance of obtaining a yearly complete physical.   Education and routine counseling performed. Handouts provided.  Orders Placed This Encounter  Procedures  . Basic metabolic panel  . Hemoglobin A1c  . TSH  . T4, free  . T3, free  . Ambulatory referral to Endocrinology  . POCT HgB A1C     Return for 71mo- lab only OV;  then OV with me 1 wk later for f/up BP, DM etc.   The patient was counseled, risk factors were discussed, anticipatory guidance given.  Gross side effects, risk and benefits, and alternatives of medications discussed with patient.  Patient is aware that all medications have potential side effects and we are unable to predict every side effect or drug-drug interaction that may occur.  Expresses verbal understanding and consents to current therapy plan and treatment regimen.  Please see AVS handed out to patient at the end of our visit for further patient instructions/ counseling done pertaining to today's office visit.    Note: This document was prepared using Dragon voice recognition software and may include unintentional dictation errors.  This document serves as a record of services personally performed by Mellody Dance, DO. It was created on her behalf by Toni Amend, a trained medical scribe. The creation of this record is based on the scribe's personal observations and the provider's statements to them.   I have reviewed the above medical documentation for accuracy and completeness and I concur.  Mellody Dance 11/24/17 12:30 PM    Subjective:    Chief Complaint  Patient  presents with  . Follow-up    Alicia Medina is a 84 y.o. female who presents to [facility] today for Diabetes Management.    Notes that her A1C will be higher today due to stress, especially due to her husband. She has been sleeping, but feels that she has been eating worse and exercising  less. Her main concern is with regards to her husband's cardiac problems, since the first of January. Her husband has to obtain a PET scan this week to investigate a suspicious area in his abdomen.  Patient is willing for referral, but reports having miserable luck with endocrinology, historically.  She has an annual gynecological visit that she keeps up with. Believes she has a pap smear every 2 years.  DM HPI: -  She has not been working on diet and exercise for diabetes  Pt is currently maintained on the following medications for diabetes:  Tresiba Medication compliance - Patient has been taking her medication as prescribed, at 20 units, sometimes 21.  Home glucose readings range: Checks her sugars in the morning. Notes that usually it's around 140's, 144. Before that, it was always lower. Lately her highest sugars have been in the 180's. Patient notes that she has trouble remembering to measure her fasting blood sugar.   Denies polyuria/polydipsia. Denies hypo/ hyperglycemia symptoms - She denies new onset of: chest pain, exercise intolerance, shortness of breath, dizziness, visual changes, headache, lower extremity swelling or claudication.   Last diabetic eye exam was No results found for: HMDIABEYEEXA  Foot exam- UTD  Last A1C in the office was: 8.3 (today)  Lab Results  Component Value Date   HGBA1C 8.7 (H) 11/22/2017   HGBA1C 8.3 09/13/2017   HGBA1C 8.2 (H) 06/21/2017    Lab Results  Component Value Date   MICROALBUR 30 09/03/2016   LDLCALC 60 06/21/2017   CREATININE 1.13 (H) 11/22/2017    1. HTN HPI:  Blood pressure is extremely high today: 165/88. RE-CHECK IN OFFICE USING MANUAL CUFF: 138/72  - Per patient, Her blood pressure has been controlled at home.  Pt is not checking it at home.   Has been taking her losartan, metoprolol as prescribed.  Notes that it usually runs in 130's/70's, but has not been taking it regularly at home. Remarks that "today  is very high from what it's ever been."  - Patient reports good compliance with blood pressure medications  - Denies medication S-E   - Smoking Status noted   - She denies new onset of: chest pain, exercise intolerance, shortness of breath, dizziness, visual changes, headache, lower extremity swelling or claudication.    Last 3 blood pressure readings in our office are as follows: BP Readings from Last 3 Encounters:  10/19/17 (!) 148/70  09/13/17 138/72  06/24/17 121/65    Filed Weights   09/13/17 0959  Weight: 166 lb (75.3 kg)     Last 3 blood pressure readings in our office are as follows: BP Readings from Last 3 Encounters:  10/19/17 (!) 148/70  09/13/17 138/72  06/24/17 121/65    BMI Readings from Last 3 Encounters:  10/19/17 28.02 kg/m  09/13/17 27.62 kg/m  06/24/17 26.66 kg/m     No problems updated.    Patient Care Team    Relationship Specialty Notifications Start End  Mellody Dance, DO PCP - General Family Medicine  06/12/16   Martinique, Peter M, MD Consulting Physician Cardiology  07/22/16   Maisie Fus, MD  Consulting Physician Obstetrics and Gynecology  41/32/44   Leighton Ruff, MD Consulting Physician General Surgery  07/22/16   Armandina Gemma, MD Consulting Physician General Surgery  07/22/16   Daryll Brod, Donnelly Physician Orthopedic Surgery  07/22/16      Patient Active Problem List   Diagnosis Date Noted  . 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 Hypertension associated with diabetes ([place]) 09/27/2014    Priority: High  . Hyperlipidemia associated with type 2 diabetes mellitus ([place]) 11/12/2011    Priority: High  . Old inferior wall myocardial infarction 08/29/2011    Priority: High  . Hemorrhoids, internal, with bleeding 05/08/2013    Priority: Medium  . Hx of thyroidectomy 09/13/2017  . Noncompliance with diet and  medication regimen 09/13/2017  . Stress and adjustment reaction 06/11/2017  . Strain of latissimus dorsi muscle 06/11/2017  . History of vitamin D deficiency 03/11/2017  . Other fatigue 03/11/2017  . Abscess 09/03/2016  . Hypothyroidism, postsurgical 07/22/2016  . Coronary artery disease 11/12/2011     Past Medical History:  Diagnosis Date  . Arthritis   . CAD (coronary artery disease) CARDIOLOGIST-  DR Martinique   08-28-2011 Inferior STEMI with BMS to the RCA, complicated by cardiogenic shock, VF, VDRF , temporary CHB and abrupt reocclusion of the RCA stent treated with a second BMS to the RCA. Has residual LAD disease. EF is 50%  . Diverticulosis of colon   . Hemorrhoids, internal, with bleeding   . Hyperlipidemia   . Hypertension   . Hypothyroidism, postsurgical   . S/p bare metal coronary artery stent    2013  . Type 2 diabetes mellitus ([place])   . Wears hearing aid    both ears     Past Surgical History:  Procedure Laterality Date  . CARDIOVASCULAR STRESS TEST  10-03-2011  dr Martinique   normal perfusion / no ischemia/ ef 77%  . CARPAL TUNNEL RELEASE Right 01/25/2013   Procedure: CARPAL TUNNEL RELEASE;  Surgeon: Wynonia Sours, MD;  Location: [place];  Service: Orthopedics;  Laterality: Right;  . CARPAL TUNNEL RELEASE Left 10/25/2013   Procedure: LEFT CARPAL TUNNEL RELEASE;  Surgeon: Wynonia Sours, MD;  Location: [place];  Service: Orthopedics;  Laterality: Left;  . CATARACT EXTRACTION W/ INTRAOCULAR LENS  IMPLANT, BILATERAL  2007  . COLONOSCOPY  04-27-2003  . CORONARY ANGIOGRAM Left 08/28/2011   Procedure: CORONARY ANGIOGRAM;  Surgeon: Peter M Martinique, MD;  Location: [facility] [place] CATH LAB;  Service: Cardiovascular;  Laterality: Left;  . CORONARY ANGIOPLASTY WITH STENT PLACEMENT  08-28-2011  dr Martinique   BMS to [facility] with abrupt reocclusion treated with second BMS/  LAD ostial 90%, proximal50-60%, first diagonal 80-90%  . LEFT HEART CATHETERIZATION WITH CORONARY  ANGIOGRAM N/A 08/28/2011   Procedure: LEFT HEART CATHETERIZATION WITH CORONARY ANGIOGRAM;  Surgeon: Peter M Martinique, MD;  Location: [facility] CATH LAB;  Service: Cardiovascular;  Laterality: N/A;  . PERCUTANEOUS CORONARY STENT INTERVENTION (PCI-S) Left 08/28/2011   Procedure: PERCUTANEOUS CORONARY STENT INTERVENTION (PCI-S);  Surgeon: Peter M Martinique, MD;  Location: [facility] CATH LAB;  Service: Cardiovascular;  Laterality: Left;  . TOTAL THYROIDECTOMY Bilateral 05-02-2007   multinodular goiter  . TRANSANAL HEMORRHOIDAL DEARTERIALIZATION N/A 03/28/2014   Procedure: TRANSANAL HEMORRHOIDAL DEARTERIALIZATION;  Surgeon: Leighton Ruff, MD;  Location: [facility];  Service: General;  Laterality: N/A;  . TRANSTHORACIC ECHOCARDIOGRAM  08-30-2011  mild LVH/ ef 24-40%/  grade I diastolic dysfunction  . TRIGGER FINGER RELEASE Right 01/25/2013   Procedure: RELEASE TRIGGER FINGER/A-1 PULLEY RIGHT INDEX FINGER;  Surgeon: Wynonia Sours, MD;  Location: [place];  Service: Orthopedics;  Laterality: Right;  . TRIGGER FINGER RELEASE Left 10/25/2013   Procedure: RELEASE A-1 PULLEY LEFT MIDDLE FINGER;  Surgeon: Wynonia Sours, MD;  Location: [place];  Service: Orthopedics;  Laterality: Left;  . TRIGGER FINGER RELEASE Left 12/04/2014   Procedure: RELEASE TRIGGER FINGER/A-1 PULLEY LEFT RING FINGER;  Surgeon: Daryll Brod, MD;  Location: [place];  Service: Orthopedics;  Laterality: Left;  . TRIGGER FINGER RELEASE Right 03/30/2017   Procedure: RELEASE TRIGGER FINGER/A-1 PULLEY RIGHT RING FINGER AND RIGHT SMALL FINGER;  Surgeon: Daryll Brod, MD;  Location: [facility];  Service: Orthopedics;  Laterality: Right;     Family History  Problem Relation Age of Onset  . Stroke Mother   . Cancer Mother        breast  . Cancer Father        lung  . Stroke Father   . Diabetes Neg Hx      Social History   Substance and Sexual Activity  Drug Use No  ,  Social History     Substance and Sexual Activity  Alcohol Use Yes  . Alcohol/week: 0.6 oz  . Types: 1 Glasses of wine per week   Comment: social  ,  Social History   Tobacco Use  Smoking Status Never Smoker  Smokeless Tobacco Never Used  ,    Current Outpatient Medications on File Prior to Visit  Medication Sig Dispense Refill  . aspirin 81 MG tablet Take 81 mg by mouth daily.    . calcium carbonate (CALCIUM 600) 1500 (600 Ca) MG TABS tablet Take 600 mg of elemental calcium by mouth daily with breakfast.    . Coenzyme Q10 200 MG TABS Take 1 tablet by mouth daily.    . Cyanocobalamin (B-12) 2500 MCG TABS Take 1 tablet by mouth daily.    Marland Kitchen loperamide (IMODIUM) 2 MG capsule Take by mouth as needed for diarrhea or loose stools.    Marland Kitchen losartan (COZAAR) 25 MG tablet Take 1 tablet (25 mg total) every morning by mouth. 90 tablet 3  . metoprolol tartrate (LOPRESSOR) 50 MG tablet TAKE 1 TABLET BY MOUTH 2 TIMES DAILY. 180 tablet 2  . Multiple Vitamin (MULTIVITAMIN) tablet Take 1 tablet by mouth daily.    . potassium chloride SA (K-DUR,KLOR-CON) 20 MEQ tablet TAKE 1 TABLET BY MOUTH DAILY. 90 tablet 3  . rosuvastatin (CRESTOR) 20 MG tablet TAKE 1 TABLET BY MOUTH DAILY. 90 tablet 2  . S-Adenosylmethionine (SAM-E) 400 MG TABS Take 1 tablet by mouth daily.    Marland Kitchen SYNTHROID 150 MCG tablet Take 1 tablet (150 mcg total) by mouth daily before breakfast. 30 tablet 6  . TRUETRACK TEST test strip Check your blood sugar every morning and 2-hour postprandial after the largest meal of the day 180 each 5  . vitamin C (ASCORBIC ACID) 500 MG tablet Take 500 mg by mouth daily.    Marland Kitchen VITAMIN E PO Take 500 mg by mouth daily.     No current facility-administered medications on file prior to visit.      Allergies  Allergen Reactions  . Demeclocycline Other (See Comments)    Severe stomach cramps  . Lisinopril Cough  . Tetracyclines & Related Other (See Comments)  Severe stomach cramps     Review of Systems:   General:   Denies fever, chills Optho/Auditory:   Denies visual changes, blurred vision Respiratory:   Denies SOB, cough, wheeze, DIB  Cardiovascular:   Denies chest pain, palpitations, painful respirations Gastrointestinal:   Denies nausea, vomiting, diarrhea.  Endocrine:     Denies new hot or cold intolerance Musculoskeletal:  Denies joint swelling, gait issues, or new unexplained myalgias/ arthralgias Skin:  Denies rash, suspicious lesions  Neurological:    Denies dizziness, unexplained weakness, numbness  Psychiatric/Behavioral:   Denies mood changes    Objective:     Blood pressure 138/72, pulse 75, height 5\' 5"  (1.651 m), weight 166 lb (75.3 kg), SpO2 97 %.  Body mass index is 27.62 kg/m.  General: Well Developed, well nourished, and in no acute distress.  HEENT: Normocephalic, atraumatic, pupils equal round reactive to light, neck supple, No carotid bruits, no JVD Skin: Warm and dry, cap RF less 2 sec Cardiac: Regular rate and rhythm, S1, S2 WNL's, no murmurs rubs or gallops Respiratory: ECTA B/L, Not using accessory muscles, speaking in full sentences. NeuroM-Sk: Ambulates w/o assistance, moves ext * 4 w/o difficulty, sensation grossly intact.  Ext: scant edema b/l lower ext Psych: No HI/SI, judgement and insight good, Euthymic mood. Full Affect.

## 2017-09-13 NOTE — Patient Instructions (Addendum)
- Referral to Endo placed today- f/up with them re: DM mgt; med mgt and dx mgt per them in future  -  Check your BP at home and write it down and keep log -  bring in next OV.   - check BMP, TSH, T3/4 AND A1C prior to next OV in 3 mo  -->  Told patient they will need a yearly complete physical exam OR medicare wellness exam - which is when we do appropriate screening tests etc; this will need to be made in addition to any chronic disease mgt appts.  Cont with Dr Nori Riis your GYN doc as he instructs on yrly basis      Diabetes Mellitus and Standards of Medical Care  Managing diabetes (diabetes mellitus) can be complicated. Your diabetes treatment may be managed by a team of health care providers, including:  A diet and nutrition specialist (registered dietitian).  A nurse.  A certified diabetes educator (CDE).  A diabetes specialist (endocrinologist).  An eye doctor.  A primary care provider.  A dentist.  Your health care providers follow a schedule in order to help you get the best quality of care. The following schedule is a general guideline for your diabetes management plan. Your health care providers may also give you more specific instructions.  HbA1c (hemoglobin A1c) test This test provides information about blood sugar (glucose) control over the previous 2-3 months. It is used to check whether your diabetes management plan needs to be adjusted.  If you are meeting your treatment goals, this test is done at least 2 times a year.  If you are not meeting treatment goals or if your treatment goals have changed, this test is done 4 times a year.  Blood pressure test  This test is done at every routine medical visit. For most people, the goal is less than 130/80. Ask your health care provider what your goal blood pressure should be.  Dental and eye exams  Visit your dentist two times a year.  If you have type 1 diabetes, get an eye exam 3-5 years after you are  diagnosed, and then once a year after your first exam. ? If you were diagnosed with type 1 diabetes as a child, get an eye exam when you are age 63 or older and have had diabetes for 3-5 years. After the first exam, you should get an eye exam once a year.  If you have type 2 diabetes, have an eye exam as soon as you are diagnosed, and then once a year after your first exam.  Foot care exam  Visual foot exams are done at every routine medical visit. The exams check for cuts, bruises, redness, blisters, sores, or other problems with the feet.  A complete foot exam is done by your health care provider once a year. This exam includes an inspection of the structure and skin of your feet, and a check of the pulses and sensation in your feet. ? Type 1 diabetes: Get your first exam 3-5 years after diagnosis. ? Type 2 diabetes: Get your first exam as soon as you are diagnosed.  Check your feet every day for cuts, bruises, redness, blisters, or sores. If you have any of these or other problems that are not healing, contact your health care provider.  Kidney function test (urine microalbumin)  This test is done once a year. ? Type 1 diabetes: Get your first test 5 years after diagnosis. ? Type 2 diabetes: Get your first  test as soon as you are diagnosed._  If you have chronic kidney disease (CKD), get a serum creatinine and estimated glomerular filtration rate (eGFR) test once a year.  Lipid profile (cholesterol, HDL, LDL, triglycerides)  This test should be done when you are diagnosed with diabetes, and every 5 years after the first test. If you are on medicines to lower your cholesterol, you may need to get this test done every year. ? The goal for LDL is less than 100 mg/dL (5.5 mmol/L). If you are at high risk, the goal is less than 70 mg/dL (3.9 mmol/L). ? The goal for HDL is 40 mg/dL (2.2 mmol/L) for men and 50 mg/dL(2.8 mmol/L) for women. An HDL cholesterol of 60 mg/dL (3.3 mmol/L) or higher  gives some protection against heart disease. ? The goal for triglycerides is less than 150 mg/dL (8.3 mmol/L).  Immunizations  The yearly flu (influenza) vaccine is recommended for everyone 6 months or older who has diabetes.  The pneumonia (pneumococcal) vaccine is recommended for everyone 2 years or older who has diabetes. If you are 9 or older, you may get the pneumonia vaccine as a series of two separate shots.  The hepatitis B vaccine is recommended for adults shortly after they have been diagnosed with diabetes.  The Tdap (tetanus, diphtheria, and pertussis) vaccine should be given: ? According to normal childhood vaccination schedules, for children. ? Every 10 years, for adults who have diabetes.  The shingles vaccine is recommended for people who have had chicken pox and are 50 years or older.  Mental and emotional health  Screening for symptoms of eating disorders, anxiety, and depression is recommended at the time of diagnosis and afterward as needed. If your screening shows that you have symptoms (you have a positive screening result), you may need further evaluation and be referred to a mental health care provider.  Diabetes self-management education  Education about how to manage your diabetes is recommended at diagnosis and ongoing as needed.  Treatment plan  Your treatment plan will be reviewed at every medical visit.  Summary  Managing diabetes (diabetes mellitus) can be complicated. Your diabetes treatment may be managed by a team of health care providers.  Your health care providers follow a schedule in order to help you get the best quality of care.  Standards of care including having regular physical exams, blood tests, blood pressure monitoring, immunizations, screening tests, and education about how to manage your diabetes.  Your health care providers may also give you more specific instructions based on your individual health.      Type 2 Diabetes  Mellitus, Self Care, Adult Caring for yourself after you have been diagnosed with type 2 diabetes (type 2 diabetes mellitus) means keeping your blood sugar (glucose) under control with a balance of:  Nutrition.  Exercise.  Lifestyle changes.  Medicines or insulin, if necessary.  Support from your team of health care providers and others.  The following information explains what you need to know to manage your diabetes at home. What do I need to do to manage my blood glucose?  Check your blood glucose every day, as often as told by your health care provider.  Contact your health care provider if your blood glucose is above your target for 2 tests in a row.  Have your A1c (hemoglobin A1c) level checked at least two times a year, or as often as told by your health care provider. Your health care provider will set individualized treatment  goals for you. Generally, the goal of treatment is to maintain the following blood glucose levels:  Before meals (preprandial): 80-130 mg/dL (4.4-7.2 mmol/L).  After meals (postprandial): below 180 mg/dL (10 mmol/L).  A1c level: less than 7%.  What do I need to know about hyperglycemia and hypoglycemia? What is hyperglycemia? Hyperglycemia, also called high blood glucose, occurs when blood glucose is too high.Make sure you know the early signs of hyperglycemia, such as:  Increased thirst.  Hunger.  Feeling very tired.  Needing to urinate more often than usual.  Blurry vision.  What is hypoglycemia? Hypoglycemia, also called low blood glucose, occurswith a blood glucose level at or below 70 mg/dL (3.9 mmol/L). The risk for hypoglycemia increases during or after exercise, during sleep, during illness, and when skipping meals or not eating for a long time (fasting). It is important to know the symptoms of hypoglycemia and treat it right away. Always have a 15-gram rapid-acting carbohydrate snack with you to treat low blood glucose. Family  members and close friends should also know the symptoms and should understand how to treat hypoglycemia, in case you are not able to treat yourself. What are the symptoms of hypoglycemia? Hypoglycemia symptoms can include:  Hunger.  Anxiety.  Sweating and feeling clammy.  Confusion.  Dizziness or feeling light-headed.  Sleepiness.  Nausea.  Increased heart rate.  Headache.  Blurry vision.  Seizure.  Nightmares.  Tingling or numbness around the mouth, lips, or tongue.  A change in speech.  Decreased ability to concentrate.  A change in coordination.  Restless sleep.  Tremors or shakes.  Fainting.  Irritability.  How do I treat hypoglycemia?  If you are alert and able to swallow safely, follow the 15:15 rule:  Take 15 grams of a rapid-acting carbohydrate. Rapid-acting options include: ? 1 tube of glucose gel. ? 3 glucose pills. ? 6-8 pieces of hard candy. ? 4 oz (120 mL) of fruit juice. ? 4 oz (120 mL) of regular (not diet) soda.  Check your blood glucose 15 minutes after you take the carbohydrate.  If the repeat blood glucose level is still at or below 70 mg/dL (3.9 mmol/L), take 15 grams of a carbohydrate again.  If your blood glucose level does not increase above 70 mg/dL (3.9 mmol/L) after 3 tries, seek emergency medical care.  After your blood glucose level returns to normal, eat a meal or a snack within 1 hour.  How do I treat severe hypoglycemia? Severe hypoglycemia is when your blood glucose level is at or below 54 mg/dL (3 mmol/L). Severe hypoglycemia is an emergency. Do not wait to see if the symptoms will go away. Get medical help right away. Call your local emergency services (911 in the [place] Do not drive yourself to the hospital. If you have severe hypoglycemia and you cannot eat or drink, you may need an injection of glucagon. A family member or close friend should learn how to check your blood glucose and how to give you a glucagon  injection. Ask your health care provider if you need to have an emergency glucagon injection kit available. Severe hypoglycemia may need to be treated in a hospital. The treatment may include getting glucose through an IV tube. You may also need treatment for the cause of your hypoglycemia. Can having diabetes put me at risk for other conditions? Having diabetes can put you at risk for other long-term (chronic) conditions, such as heart disease and kidney disease. Your health care provider may prescribe medicines  to help prevent complications from diabetes. These medicines may include:  Aspirin.  Medicine to lower cholesterol.  Medicine to control blood pressure.  What else can I do to manage my diabetes? Take your diabetes medicines as told  If your health care provider prescribed insulin or diabetes medicines, take them every day.  Do not run out of insulin or other diabetes medicines that you take. Plan ahead so you always have these available.  If you use insulin, adjust your dosage based on how physically active you are and what foods you eat. Your health care provider will tell you how to adjust your dosage. Make healthy food choices  The things that you eat and drink affect your blood glucose and your insulin dosage. Making good choices helps to control your diabetes and prevent other health problems. A healthy meal plan includes eating lean proteins, complex carbohydrates, fresh fruits and vegetables, low-fat dairy products, and healthy fats. Make an appointment to see a diet and nutrition specialist (registered dietitian) to help you create an eating plan that is right for you. Make sure that you:  Follow instructions from your health care provider about eating or drinking restrictions.  Drink enough fluid to keep your urine clear or pale yellow.  Eat healthy snacks between nutritious meals.  Track the carbohydrates that you eat. Do this by reading food labels and learning the  standard serving sizes of foods.  Follow your sick day plan whenever you cannot eat or drink as usual. Make this plan in advance with your health care provider.  Stay active  Exercise regularly, as told by your health care provider. This may include:  Stretching and doing strength exercises, such as yoga or weightlifting, at least 2 times a week.  Doing at least 150 minutes of moderate-intensity or vigorous-intensity exercise each week. This could be brisk walking, biking, or water aerobics. ? Spread out your activity over at least 3 days of the week. ? Do not go more than 2 days in a row without doing some kind of physical activity.  When you start a new exercise or activity, work with your health care provider to adjust your insulin, medicines, or food intake as needed. Make healthy lifestyle choices  Do not use any tobacco products, such as cigarettes, chewing tobacco, and e-cigarettes. If you need help quitting, ask your health care provider.  If your health care provider says that alcohol is safe for you, limit alcohol intake to no more than 1 drink per day for nonpregnant women and 2 drinks per day for men. One drink equals 12 oz of beer, 5 oz of wine, or 1 oz of hard liquor.  Learn to manage stress. If you need help with this, ask your health care provider. Care for your body   Keep your immunizations up to date. In addition to getting vaccinations as told by your health care provider, it is recommended that you get vaccinated against the following illnesses: ? The flu (influenza). Get a flu shot every year. ? Pneumonia. ? Hepatitis B.  Schedule an eye exam soon after your diagnosis, and then one time every year after that.  Check your skin and feet every day for cuts, bruises, redness, blisters, or sores. Schedule a foot exam with your health care provider once every year.  Brush your teeth and gums two times a day, and floss at least one time a day. Visit your dentist at  least once every 6 months.  Maintain a healthy weight.  General instructions  Take over-the-counter and prescription medicines only as told by your health care provider.  Share your diabetes management plan with people in your workplace, school, and household.  Check your urine for ketones when you are ill and as told by your health care provider.  Ask your health care provider: ? Do I need to meet with a diabetes educator? ? Where can I find a support group for people with diabetes?  Carry a medical alert card or wear medical alert jewelry.  Keep all follow-up visits as told by your health care provider. This is important. Where to find more information: For more information about diabetes, visit:  American Diabetes Association (ADA): www.diabetes.org  American Association of Diabetes Educators (AADE): www.diabeteseducator.org/patient-resources  This information is not intended to replace advice given to you by your health care provider. Make sure you discuss any questions you have with your health care provider. Document Released: 11/04/2015 Document Revised: 12/19/2015 Document Reviewed: 08/16/2015 Elsevier Interactive Patient Education  2017 [employer].      Blood Glucose Monitoring, Adult Monitoring your blood sugar (glucose) helps you manage your diabetes. It also helps you and your health care provider determine how well your diabetes management plan is working. Blood glucose monitoring involves checking your blood glucose as often as directed, and keeping a record (log) of your results over time. Why should I monitor my blood glucose? Checking your blood glucose regularly can:  Help you understand how food, exercise, illnesses, and medicines affect your blood glucose.  Let you know what your blood glucose is at any time. You can quickly tell if you are having low blood glucose (hypoglycemia) or high blood glucose (hyperglycemia).  Help you and your health care  provider adjust your medicines as needed.  When should I check my blood glucose? Follow instructions from your health care provider about how often to check your blood glucose.   This may depend on:  The type of diabetes you have.  How well-controlled your diabetes is.  Medicines you are taking.  If you have type 1 diabetes:  Check your blood glucose at least 2 times a day.  Also check your blood glucose: ? Before every insulin injection. ? Before and after exercise. ? Between meals. ? 2 hours after a meal. ? Occasionally between 2:00 a.m. and 3:00 a.m., as directed. ? Before potentially dangerous tasks, like driving or using heavy machinery. ? At bedtime.  You may need to check your blood glucose more often, up to 6-10 times a day: ? If you use an insulin pump. ? If you need multiple daily injections (MDI). ? If your diabetes is not well-controlled. ? If you are ill. ? If you have a history of severe hypoglycemia. ? If you have a history of not knowing when your blood glucose is getting low (hypoglycemia unawareness).  If you have type 2 diabetes:  If you take insulin or other diabetes medicines, check your blood glucose at least 2 times a day.  If you are on intensive insulin therapy, check your blood glucose at least 4 times a day. Occasionally, you may also need to check between 2:00 a.m. and 3:00 a.m., as directed.  Also check your blood glucose: ? Before and after exercise. ? Before potentially dangerous tasks, like driving or using heavy machinery.  You may need to check your blood glucose more often if: ? Your medicine is being adjusted. ? Your diabetes is not well-controlled. ? You are ill.  What is  a blood glucose log?  A blood glucose log is a record of your blood glucose readings. It helps you and your health care provider: ? Look for patterns in your blood glucose over time. ? Adjust your diabetes management plan as needed.  Every time you check your  blood glucose, write down your result and notes about things that may be affecting your blood glucose, such as your diet and exercise for the day.  Most glucose meters store a record of glucose readings in the meter. Some meters allow you to download your records to a computer. How do I check my blood glucose? Follow these steps to get accurate readings of your blood glucose: Supplies needed   Blood glucose meter.  Test strips for your meter. Each meter has its own strips. You must use the strips that come with your meter.  A needle to prick your finger (lancet). Do not use lancets more than once.  A device that holds the lancet (lancing device).  A journal or log book to write down your results.  Procedure  Wash your hands with soap and water.  Prick the side of your finger (not the tip) with the lancet. Use a different finger each time.  Gently rub the finger until a small drop of blood appears.  Follow instructions that come with your meter for inserting the test strip, applying blood to the strip, and using your blood glucose meter.  Write down your result and any notes.  Alternative testing sites  Some meters allow you to use areas of your body other than your finger (alternative sites) to test your blood.  If you think you may have hypoglycemia, or if you have hypoglycemia unawareness, do not use alternative sites. Use your finger instead.  Alternative sites may not be as accurate as the fingers, because blood flow is slower in these areas. This means that the result you get may be delayed, and it may be different from the result that you would get from your finger.  The most common alternative sites are: ? Forearm. ? Thigh. ? Palm of the hand.  Additional tips  Always keep your supplies with you.  If you have questions or need help, all blood glucose meters have a 24-hour "hotline" number that you can call. You may also contact your health care provider.  After  you use a few boxes of test strips, adjust (calibrate) your blood glucose meter by following instructions that came with your meter.    The American Diabetes Association suggests the following targets for most nonpregnant adults with diabetes.  More or less stringent glycemic goals may be appropriate for each individual.  A1C: Less than 7% A1C may also be reported as eAG: Less than 154 mg/dl Before a meal (preprandial plasma glucose): 80-130 mg/dl 1-2 hours after beginning of the meal (Postprandial plasma glucose)*: Less than 180 mg/dl  *Postprandial glucose may be targeted if A1C goals are not met despite reaching preprandial glucose goals.   GOALS in short:  The goals are for the Hgb A1C to be less than 7.0 & blood pressure to be less than 130/80.    It is recommended that all diabetics are educated on and follow a healthy diabetic diet, exercise for 30 minutes 3-4 times per week (walking, biking, swimming, or machine), monitor blood glucose readings and bring that record with you to be reviewed at your next office visit.     You should be checking fasting blood sugars- especially after  you eat poorly or eat really healthy, and also check 2 hour postprandial blood sugars after largest meal of the day.    Write these down and bring in your log at each office visit.    You will need to be seen every 3 months by the provider managing your Diabetes unless told otherwise by that provider.   You will need yearly eye exams from an eye specialist and foot exams to check the nerves of your feet.  Also, your urine should be checked yearly as well to make sure excess protein is not present.   If you are checking your blood pressure at home, please record it and bring it to your next office visit.    Follow the Dietary Approaches to Stop Hypertension (DASH) diet (3 servings of fruit and vegetables daily, whole grains, low sodium, low-fat proteins).  See below.    Lastly, when it comes to your  cholesterol, the goal is to have the HDL (good cholesterol) >40, and the LDL (bad cholesterol) <100.   It is recommended that you follow a heart healthy, low saturated and trans-fat diet and exercise for 30 minutes at least 5 times a week.     (( Check out the DASH diet = 1.5 Gram Low Sodium Diet   A 1.5 gram sodium diet restricts the amount of sodium in the diet to no more than 1.5 g or 1500 mg daily.  The American Heart Association recommends Americans over the age of 46 to consume no more than 1500 mg of sodium each day to reduce the risk of developing high blood pressure.  Research also shows that limiting sodium may reduce heart attack and stroke risk.  Many foods contain sodium for flavor and sometimes as a preservative.  When the amount of sodium in a diet needs to be low, it is important to know what to look for when choosing foods and drinks.  The following includes some information and guidelines to help make it easier for you to adapt to a low sodium diet.    QUICK TIPS  Do not add salt to food.  Avoid convenience items and fast food.  Choose unsalted snack foods.  Buy lower sodium products, often labeled as "lower sodium" or "no salt added."  Check food labels to learn how much sodium is in 1 serving.  When eating at a restaurant, ask that your food be prepared with less salt or none, if possible.    READING FOOD LABELS FOR SODIUM INFORMATION  The nutrition facts label is a good place to find how much sodium is in foods. Look for products with no more than 400 mg of sodium per serving.  Remember that 1.5 g = 1500 mg.  The food label may also list foods as:  Sodium-free: Less than 5 mg in a serving.  Very low sodium: 35 mg or less in a serving.  Low-sodium: 140 mg or less in a serving.  Light in sodium: 50% less sodium in a serving. For example, if a food that usually has 300 mg of sodium is changed to become light in sodium, it will have 150 mg of sodium.  Reduced sodium: 25%  less sodium in a serving. For example, if a food that usually has 400 mg of sodium is changed to reduced sodium, it will have 300 mg of sodium.    CHOOSING FOODS  Grains  Avoid: Salted crackers and snack items. Some cereals, including instant hot cereals. Bread stuffing and  biscuit mixes. Seasoned rice or pasta mixes.  Choose: Unsalted snack items. Low-sodium cereals, oats, puffed wheat and rice, shredded wheat. English muffins and bread. Pasta.  Meats  Avoid: Salted, canned, smoked, spiced, pickled meats, including fish and poultry. Bacon, ham, sausage, cold cuts, hot dogs, anchovies.  Choose: Low-sodium canned tuna and salmon. Fresh or frozen meat, poultry, and fish.  Dairy  Avoid: Processed cheese and spreads. Cottage cheese. Buttermilk and condensed milk. Regular cheese.  Choose: Milk. Low-sodium cottage cheese. Yogurt. Sour cream. Low-sodium cheese.  Fruits and Vegetables  Avoid: Regular canned vegetables. Regular canned tomato sauce and paste. Frozen vegetables in sauces. Olives. Angie Fava. Relishes. Sauerkraut.  Choose: Low-sodium canned vegetables. Low-sodium tomato sauce and paste. Frozen or fresh vegetables. Fresh and frozen fruit.  Condiments  Avoid: Canned and packaged gravies. Worcestershire sauce. Tartar sauce. Barbecue sauce. Soy sauce. Steak sauce. Ketchup. Onion, garlic, and table salt. Meat flavorings and tenderizers.  Choose: Fresh and dried herbs and spices. Low-sodium varieties of mustard and ketchup. Lemon juice. Tabasco sauce. Horseradish.    SAMPLE 1.5 GRAM SODIUM MEAL PLAN:   Breakfast / Sodium (mg)  1 cup low-fat milk / 143 mg  1 whole-wheat English muffin / 240 mg  1 tbs heart-healthy margarine / 153 mg  1 hard-boiled egg / 139 mg  1 small orange / 0 mg  Lunch / Sodium (mg)  1 cup raw carrots / 76 mg  2 tbs no salt added peanut butter / 5 mg  2 slices whole-wheat bread / 270 mg  1 tbs jelly / 6 mg   cup red grapes / 2 mg  Dinner / Sodium (mg)  1 cup  whole-wheat pasta / 2 mg  1 cup low-sodium tomato sauce / 73 mg  3 oz lean ground beef / 57 mg  1 small side salad (1 cup raw spinach leaves,  cup cucumber,  cup yellow bell pepper) with 1 tsp olive oil and 1 tsp red wine vinegar / 25 mg  Snack / Sodium (mg)  1 container low-fat vanilla yogurt / 107 mg  3 graham cracker squares / 127 mg  Nutrient Analysis  Calories: 1745  Protein: 75 g  Carbohydrate: 237 g  Fat: 57 g  Sodium: 1425 mg  Document Released: 07/13/2005 Document Revised: 03/25/2011 Document Reviewed: 10/14/2009  ExitCare Patient Information 2012 [place].))    This information is not intended to replace advice given to you by your health care provider. Make sure you discuss any questions you have with your health care provider. Document Released: 07/16/2003 Document Revised: 01/31/2016 Document Reviewed: 12/23/2015 Elsevier Interactive Patient Education  2017 [employer].

## 2017-10-19 ENCOUNTER — Ambulatory Visit (INDEPENDENT_AMBULATORY_CARE_PROVIDER_SITE_OTHER): Payer: Medicare Other | Admitting: Endocrinology

## 2017-10-19 ENCOUNTER — Encounter: Payer: Self-pay | Admitting: Endocrinology

## 2017-10-19 DIAGNOSIS — N183 Chronic kidney disease, stage 3 unspecified: Secondary | ICD-10-CM

## 2017-10-19 DIAGNOSIS — Z794 Long term (current) use of insulin: Secondary | ICD-10-CM

## 2017-10-19 DIAGNOSIS — E1122 Type 2 diabetes mellitus with diabetic chronic kidney disease: Secondary | ICD-10-CM

## 2017-10-19 MED ORDER — INSULIN DEGLUDEC 100 UNIT/ML ~~LOC~~ SOPN: 25.0000 [IU] | PEN_INJECTOR | Freq: Every day | SUBCUTANEOUS | 3 refills | Status: DC

## 2017-10-19 NOTE — Patient Instructions (Addendum)
good diet and exercise significantly improve the control of your diabetes.  please let me know if you wish to be referred to a dietician.  high blood sugar is very risky to your health.  you should see an eye doctor and dentist every year.  It is very important to get all recommended vaccinations.  Controlling your blood pressure and cholesterol drastically reduces the damage diabetes does to your body.  Those who smoke should quit.  Please discuss these with your doctor.  check your blood sugar 4 times a day.  vary the time of day when you check, between before the 3 meals, and at bedtime.  also check if you have symptoms of your blood sugar being too high or too low.  please keep a record of the readings and bring it to your next appointment here (or you can bring the meter itself).  You can write it on any piece of paper.  please call us sooner if your blood sugar goes below 70, or if you have a lot of readings over 200. Please increase the tresiba to 25 units daily. Please come back for a follow-up appointment in 2 months.

## 2017-10-19 NOTE — Progress Notes (Signed)
Subjective:    Patient ID: Alicia Medina, female    DOB: [birth year], 84 y.o.   MRN: [id number]  HPI pt is referred by Dr Raliegh Scarlet, for diabetes.  Pt states DM was dx'ed in approx 2008; she has mild if any neuropathy of the lower extremities; she has associated CAD and renal insuff; she has been on insulin since 2013; pt says her diet and exercise are fair; she has never had GDM, pancreatitis, pancreatic surgery, severe hypoglycemia or DKA.  She did not tolerate metformin (diarrhea).  He takes tresiba.  He says cbg's vary form the 100's-200's Past Medical History:  Diagnosis Date  . Arthritis   . CAD (coronary artery disease) CARDIOLOGIST-  DR Martinique   08-28-2011 Inferior STEMI with BMS to the RCA, complicated by cardiogenic shock, VF, VDRF , temporary CHB and abrupt reocclusion of the RCA stent treated with a second BMS to the RCA. Has residual LAD disease. EF is 50%  . Diverticulosis of colon   . Hemorrhoids, internal, with bleeding   . Hyperlipidemia   . Hypertension   . Hypothyroidism, postsurgical   . S/p bare metal coronary artery stent    2013  . Type 2 diabetes mellitus ([place])   . Wears hearing aid    both ears    Past Surgical History:  Procedure Laterality Date  . CARDIOVASCULAR STRESS TEST  10-03-2011  dr Martinique   normal perfusion / no ischemia/ ef 77%  . CARPAL TUNNEL RELEASE Right 01/25/2013   Procedure: CARPAL TUNNEL RELEASE;  Surgeon: Wynonia Sours, MD;  Location: [place];  Service: Orthopedics;  Laterality: Right;  . CARPAL TUNNEL RELEASE Left 10/25/2013   Procedure: LEFT CARPAL TUNNEL RELEASE;  Surgeon: Wynonia Sours, MD;  Location: [place];  Service: Orthopedics;  Laterality: Left;  . CATARACT EXTRACTION W/ INTRAOCULAR LENS  IMPLANT, BILATERAL  2007  . COLONOSCOPY  04-27-2003  . CORONARY ANGIOGRAM Left 08/28/2011   Procedure: CORONARY ANGIOGRAM;  Surgeon: Peter M Martinique, MD;  Location: [facility] CATH LAB;  Service: Cardiovascular;  Laterality:  Left;  . CORONARY ANGIOPLASTY WITH STENT PLACEMENT  08-28-2011  dr Martinique   BMS to [facility] with abrupt reocclusion treated with second BMS/  LAD ostial 90%, proximal50-60%, first diagonal 80-90%  . LEFT HEART CATHETERIZATION WITH CORONARY ANGIOGRAM N/A 08/28/2011   Procedure: LEFT HEART CATHETERIZATION WITH CORONARY ANGIOGRAM;  Surgeon: Peter M Martinique, MD;  Location: [facility] CATH LAB;  Service: Cardiovascular;  Laterality: N/A;  . PERCUTANEOUS CORONARY STENT INTERVENTION (PCI-S) Left 08/28/2011   Procedure: PERCUTANEOUS CORONARY STENT INTERVENTION (PCI-S);  Surgeon: Peter M Martinique, MD;  Location: [facility] CATH LAB;  Service: Cardiovascular;  Laterality: Left;  . TOTAL THYROIDECTOMY Bilateral 05-02-2007   multinodular goiter  . TRANSANAL HEMORRHOIDAL DEARTERIALIZATION N/A 03/28/2014   Procedure: TRANSANAL HEMORRHOIDAL DEARTERIALIZATION;  Surgeon: Leighton Ruff, MD;  Location: [facility];  Service: General;  Laterality: N/A;  . TRANSTHORACIC ECHOCARDIOGRAM  08-30-2011   mild LVH/ ef 40-98%/  grade I diastolic dysfunction  . TRIGGER FINGER RELEASE Right 01/25/2013   Procedure: RELEASE TRIGGER FINGER/A-1 PULLEY RIGHT INDEX FINGER;  Surgeon: Wynonia Sours, MD;  Location: [place];  Service: Orthopedics;  Laterality: Right;  . TRIGGER FINGER RELEASE Left 10/25/2013   Procedure: RELEASE A-1 PULLEY LEFT MIDDLE FINGER;  Surgeon: Wynonia Sours, MD;  Location: [place];  Service: Orthopedics;  Laterality: Left;  . TRIGGER FINGER RELEASE Left 12/04/2014   Procedure: RELEASE TRIGGER FINGER/A-1  PULLEY LEFT RING FINGER;  Surgeon: Daryll Brod, MD;  Location: [place];  Service: Orthopedics;  Laterality: Left;  . TRIGGER FINGER RELEASE Right 03/30/2017   Procedure: RELEASE TRIGGER FINGER/A-1 PULLEY RIGHT RING FINGER AND RIGHT SMALL FINGER;  Surgeon: Daryll Brod, MD;  Location: [place];  Service: Orthopedics;  Laterality: Right;    Social History    Socioeconomic History  . Marital status: Married    Spouse name: Not on file  . Number of children: Not on file  . Years of education: Not on file  . Highest education level: Not on file  Occupational History  . Not on file  Social Needs  . Financial resource strain: Not on file  . Food insecurity:    Worry: Not on file    Inability: Not on file  . Transportation needs:    Medical: Not on file    Non-medical: Not on file  Tobacco Use  . Smoking status: Never Smoker  . Smokeless tobacco: Never Used  Substance and Sexual Activity  . Alcohol use: Yes    Alcohol/week: 0.6 oz    Types: 1 Glasses of wine per week    Comment: social  . Drug use: No  . Sexual activity: Not on file  Lifestyle  . Physical activity:    Days per week: Not on file    Minutes per session: Not on file  . Stress: Not on file  Relationships  . Social connections:    Talks on phone: Not on file    Gets together: Not on file    Attends religious service: Not on file    Active member of club or organization: Not on file    Attends meetings of clubs or organizations: Not on file    Relationship status: Not on file  . Intimate partner violence:    Fear of current or ex partner: Not on file    Emotionally abused: Not on file    Physically abused: Not on file    Forced sexual activity: Not on file  Other Topics Concern  . Not on file  Social History Narrative   Lives with husband.   Has 2 sons- both healthy.   Younger son lives in [place], [place] in order son lives in [place].    Current Outpatient Medications on File Prior to Visit  Medication Sig Dispense Refill  . aspirin 81 MG tablet Take 81 mg by mouth daily.    . calcium carbonate (CALCIUM 600) 1500 (600 Ca) MG TABS tablet Take 600 mg of elemental calcium by mouth daily with breakfast.    . Coenzyme Q10 200 MG TABS Take 1 tablet by mouth daily.    . Cyanocobalamin (B-12) 2500 MCG TABS Take 1 tablet by mouth daily.    Marland Kitchen loperamide (IMODIUM) 2  MG capsule Take by mouth as needed for diarrhea or loose stools.    Marland Kitchen losartan (COZAAR) 25 MG tablet Take 1 tablet (25 mg total) every morning by mouth. 90 tablet 3  . metoprolol tartrate (LOPRESSOR) 50 MG tablet TAKE 1 TABLET BY MOUTH 2 TIMES DAILY. 180 tablet 2  . Multiple Vitamin (MULTIVITAMIN) tablet Take 1 tablet by mouth daily.    . potassium chloride SA (K-DUR,KLOR-CON) 20 MEQ tablet TAKE 1 TABLET BY MOUTH DAILY. 90 tablet 3  . rosuvastatin (CRESTOR) 20 MG tablet TAKE 1 TABLET BY MOUTH DAILY. 90 tablet 2  . S-Adenosylmethionine (SAM-E) 400 MG TABS Take 1 tablet by mouth daily.    Marland Kitchen  SYNTHROID 150 MCG tablet Take 1 tablet (150 mcg total) by mouth daily before breakfast. 30 tablet 6  . TRUETRACK TEST test strip Check your blood sugar every morning and 2-hour postprandial after the largest meal of the day 180 each 5  . vitamin C (ASCORBIC ACID) 500 MG tablet Take 500 mg by mouth daily.    Marland Kitchen VITAMIN E PO Take 500 mg by mouth daily.     No current facility-administered medications on file prior to visit.     Allergies  Allergen Reactions  . Demeclocycline Other (See Comments)    Severe stomach cramps  . Lisinopril Cough  . Tetracyclines & Related Other (See Comments)    Severe stomach cramps    Family History  Problem Relation Age of Onset  . Stroke Mother   . Cancer Mother        breast  . Cancer Father        lung  . Stroke Father   . Diabetes Neg Hx     BP (!) 148/70 (BP Location: Left Arm, Patient Position: Sitting, Cuff Size: Normal)   Pulse 76   Wt 168 lb 6.4 oz (76.4 kg)   SpO2 95%   BMI 28.02 kg/m    Review of Systems denies weight loss, blurry vision, headache, chest pain, sob, n/v, urinary frequency, muscle cramps, excessive diaphoresis, memory loss, depression, cold intolerance, rhinorrhea, and easy bruising.       Objective:   Physical Exam VS: see vs page GEN: no distress HEAD: head: no deformity eyes: no periorbital swelling, no proptosis.  external  nose and ears are normal mouth: no lesion seen EARS: bilat HA's.   NECK: supple, thyroid is not enlarged CHEST WALL: no deformity LUNGS: clear to auscultation CV: reg rate and rhythm, no murmur ABD: abdomen is soft, nontender.  no hepatosplenomegaly.  not distended.  no hernia MUSCULOSKELETAL: muscle bulk and strength are grossly normal.  no obvious joint swelling.  gait is normal and steady EXTEMITIES: no deformity.  no ulcer on the feet.  feet are of normal color and temp.  no edema PULSES: dorsalis pedis intact bilat.  no carotid bruit NEURO:  cn 2-12 grossly intact.   readily moves all 4's.  sensation is intact to touch on the feet SKIN:  Normal texture and temperature.  No rash or suspicious lesion is visible.   NODES:  None palpable at the neck PSYCH: alert, well-oriented.  Does not appear anxious nor depressed.  Lab Results  Component Value Date   HGBA1C 8.3 09/13/2017   Lab Results  Component Value Date   CREATININE 1.01 (H) 06/21/2017   BUN 15 06/21/2017   NA 143 06/21/2017   K 5.1 06/21/2017   CL 104 06/21/2017   CO2 26 06/21/2017   I have reviewed outside records, and summarized: Pt was noted to have elevated a1c, and referred here.  Pt was noted to have h/o noncompliance, and poor relationship with prior endocrinologists      Assessment & Plan:  Insulin-requiring type 2 DM, with renal insuff, new to me: he declines multiple daily injections.    Patient Instructions  good diet and exercise significantly improve the control of your diabetes.  please let me know if you wish to be referred to a dietician.  high blood sugar is very risky to your health.  you should see an eye doctor and dentist every year.  It is very important to get all recommended vaccinations.  Controlling your blood pressure and  cholesterol drastically reduces the damage diabetes does to your body.  Those who smoke should quit.  Please discuss these with your doctor.  check your blood sugar 4 times  a day.  vary the time of day when you check, between before the 3 meals, and at bedtime.  also check if you have symptoms of your blood sugar being too high or too low.  please keep a record of the readings and bring it to your next appointment here (or you can bring the meter itself).  You can write it on any piece of paper.  please call us sooner if your blood sugar goes below 70, or if you have a lot of readings over 200. Please increase the tresiba to 25 units daily. Please come back for a follow-up appointment in 2 months.

## 2017-10-28 DIAGNOSIS — Z961 Presence of intraocular lens: Secondary | ICD-10-CM | POA: Diagnosis not present

## 2017-10-28 DIAGNOSIS — H524 Presbyopia: Secondary | ICD-10-CM | POA: Diagnosis not present

## 2017-10-28 DIAGNOSIS — D3132 Benign neoplasm of left choroid: Secondary | ICD-10-CM | POA: Diagnosis not present

## 2017-10-28 DIAGNOSIS — E119 Type 2 diabetes mellitus without complications: Secondary | ICD-10-CM | POA: Diagnosis not present

## 2017-10-28 DIAGNOSIS — H52223 Regular astigmatism, bilateral: Secondary | ICD-10-CM | POA: Diagnosis not present

## 2017-11-10 DIAGNOSIS — N819 Female genital prolapse, unspecified: Secondary | ICD-10-CM | POA: Diagnosis not present

## 2017-11-22 ENCOUNTER — Other Ambulatory Visit: Payer: Medicare Other

## 2017-11-22 DIAGNOSIS — E0829 Diabetes mellitus due to underlying condition with other diabetic kidney complication: Secondary | ICD-10-CM | POA: Diagnosis not present

## 2017-11-22 DIAGNOSIS — E1169 Type 2 diabetes mellitus with other specified complication: Secondary | ICD-10-CM | POA: Diagnosis not present

## 2017-11-22 DIAGNOSIS — E785 Hyperlipidemia, unspecified: Secondary | ICD-10-CM

## 2017-11-22 DIAGNOSIS — I10 Essential (primary) hypertension: Secondary | ICD-10-CM

## 2017-11-22 DIAGNOSIS — R809 Proteinuria, unspecified: Principal | ICD-10-CM

## 2017-11-22 DIAGNOSIS — Z9889 Other specified postprocedural states: Secondary | ICD-10-CM | POA: Diagnosis not present

## 2017-11-22 DIAGNOSIS — Z794 Long term (current) use of insulin: Principal | ICD-10-CM

## 2017-11-22 DIAGNOSIS — E1159 Type 2 diabetes mellitus with other circulatory complications: Secondary | ICD-10-CM

## 2017-11-22 DIAGNOSIS — Z9009 Acquired absence of other part of head and neck: Secondary | ICD-10-CM

## 2017-11-22 DIAGNOSIS — E890 Postprocedural hypothyroidism: Secondary | ICD-10-CM

## 2017-11-23 LAB — BASIC METABOLIC PANEL
BUN/Creatinine Ratio: 13 (ref 12–28)
BUN: 15 mg/dL (ref 8–27)
CALCIUM: 9.7 mg/dL (ref 8.7–10.3)
CHLORIDE: 103 mmol/L (ref 96–106)
CO2: 25 mmol/L (ref 20–29)
Creatinine, Ser: 1.13 mg/dL — ABNORMAL HIGH (ref 0.57–1.00)
GFR calc non Af Amer: 45 mL/min/{1.73_m2} — ABNORMAL LOW (ref >59)
GFR, EST AFRICAN AMERICAN: 52 mL/min/{1.73_m2} — AB (ref >59)
Glucose: 136 mg/dL — ABNORMAL HIGH (ref 65–99)
POTASSIUM: 4.9 mmol/L (ref 3.5–5.2)
Sodium: 141 mmol/L (ref 134–144)

## 2017-11-23 LAB — HEMOGLOBIN A1C
ESTIMATED AVERAGE GLUCOSE: 203 mg/dL
HEMOGLOBIN A1C: 8.7 % — AB (ref 4.8–5.6)

## 2017-11-23 LAB — TSH: TSH: 2.24 u[IU]/mL (ref 0.450–4.500)

## 2017-11-23 LAB — T3, FREE: T3, Free: 2.8 pg/mL (ref 2.0–4.4)

## 2017-11-23 LAB — T4, FREE: Free T4: 1.7 ng/dL (ref 0.82–1.77)

## 2017-11-29 ENCOUNTER — Ambulatory Visit (INDEPENDENT_AMBULATORY_CARE_PROVIDER_SITE_OTHER): Payer: Medicare Other | Admitting: Family Medicine

## 2017-11-29 ENCOUNTER — Encounter: Payer: Self-pay | Admitting: Family Medicine

## 2017-11-29 VITALS — BP 137/74 | HR 70 | Ht 65.0 in | Wt 171.3 lb

## 2017-11-29 DIAGNOSIS — E1159 Type 2 diabetes mellitus with other circulatory complications: Secondary | ICD-10-CM

## 2017-11-29 DIAGNOSIS — N183 Chronic kidney disease, stage 3 unspecified: Secondary | ICD-10-CM

## 2017-11-29 DIAGNOSIS — Z9111 Patient's noncompliance with dietary regimen: Secondary | ICD-10-CM

## 2017-11-29 DIAGNOSIS — I10 Essential (primary) hypertension: Secondary | ICD-10-CM

## 2017-11-29 DIAGNOSIS — Z794 Long term (current) use of insulin: Secondary | ICD-10-CM | POA: Diagnosis not present

## 2017-11-29 DIAGNOSIS — E1169 Type 2 diabetes mellitus with other specified complication: Secondary | ICD-10-CM

## 2017-11-29 DIAGNOSIS — E785 Hyperlipidemia, unspecified: Secondary | ICD-10-CM | POA: Diagnosis not present

## 2017-11-29 DIAGNOSIS — E0829 Diabetes mellitus due to underlying condition with other diabetic kidney complication: Secondary | ICD-10-CM | POA: Diagnosis not present

## 2017-11-29 DIAGNOSIS — I252 Old myocardial infarction: Secondary | ICD-10-CM

## 2017-11-29 DIAGNOSIS — Z91148 Patient's other noncompliance with medication regimen for other reason: Secondary | ICD-10-CM

## 2017-11-29 DIAGNOSIS — Z9114 Patient's other noncompliance with medication regimen: Secondary | ICD-10-CM

## 2017-11-29 DIAGNOSIS — I152 Hypertension secondary to endocrine disorders: Secondary | ICD-10-CM

## 2017-11-29 DIAGNOSIS — R809 Proteinuria, unspecified: Secondary | ICD-10-CM | POA: Diagnosis not present

## 2017-11-29 DIAGNOSIS — Z91119 Patient's noncompliance with dietary regimen due to unspecified reason: Secondary | ICD-10-CM

## 2017-11-29 DIAGNOSIS — E0822 Diabetes mellitus due to underlying condition with diabetic chronic kidney disease: Secondary | ICD-10-CM | POA: Diagnosis not present

## 2017-11-29 LAB — POCT UA - MICROALBUMIN
Creatinine, POC: 100 mg/dL
MICROALBUMIN (UR) POC: 80 mg/L

## 2017-11-29 NOTE — Progress Notes (Signed)
Impression and Recommendations:    1. Diabetes mellitus due to underlying condition with microalbuminuria, with long-term current use of insulin ([place])   2. Diabetes mellitus due to underlying condition with stage 3 chronic kidney disease, without long-term current use of insulin ([place])   3. Hypertension associated with diabetes ([place])   4. Hyperlipidemia associated with type 2 diabetes mellitus ([place])   5. Old inferior wall myocardial infarction   6. Noncompliance with diet and medication regimen     1. DM2 with insulin use -DM2 is poorly controlled at this time. A1c from 11-22-17 was 8.7. -pt is followed closely by Dr. Loanne Drilling, endocrinologist, who manages her medications.  -continue meds per endo. -she was told per Loanne Drilling to [place] in 2 months, around 12-19-17. Keep this appointment and bring BS logs to him. Ask him about wt gain due to insulin and if there are any alternative meds she can start.   2. DM2 with CKD stage 3 -control BS and BP. -drink adequate amounts of water, equal to half of your weight in oz per day. -check urine microalbumin.  3. HTN assoc with diabetes -BP well-controlled in office today. Pt asymptomatic and stable. -encouraged pt to check BP regularly and keep a log. Bring this into next OV. -continue meds as listed below.  -keep appt with cardiologist, Dr. Martinique, in near future.  4. HLD assoc with DM2 -Pt asymptomatic and stable at this time. -LDL 05/2017 was at goal at 48. Goal <75. -continue meds as listed below per cardiology.  5. Old inferior wall MI -will continue to monitor closely. Pt asymptomatic. Pt has appt with cardiologist, Dr. Martinique, in July 2019.     6. Noncompliance with diet and medication regimen -encouraged pt to take her meds as listed below. Keep your other dr's appointments.      -Strongly encouraged pt to walk regularly. -recommend regular prayer or meditation.     Education and routine counseling performed. Handouts  provided.  Orders Placed This Encounter  Procedures  . POCT UA - Microalbumin     Return in about 4 months (around 04/01/2018) for f/up Dr Loanne Drilling- DM; with [place] 1mo BP, HLD, CKD.   The patient was counseled, risk factors were discussed, anticipatory guidance given.  Gross side effects, risk and benefits, and alternatives of medications discussed with patient.  Patient is aware that all medications have potential side effects and we are unable to predict every side effect or drug-drug interaction that may occur.  Expresses verbal understanding and consents to current therapy plan and treatment regimen.  Please see AVS handed out to patient at the end of our visit for further patient instructions/ counseling done pertaining to today's office visit.    Note: This document was prepared using Dragon voice recognition software and may include unintentional dictation errors.   This document serves as a record of services personally performed by Mellody Dance, DO. It was created on her behalf by Mayer Masker, a trained medical scribe. The creation of this record is based on the scribe's personal observations and the provider's statements to them.   I have reviewed the above medical documentation for accuracy and completeness and I concur.  Mellody Dance 11/29/17 3:50 PM    Subjective:    Chief Complaint  Patient presents with  . Follow-up     Alicia Medina is a 84 y.o. female who presents to [place] at [facility] today for Diabetes Management and HTN.  Hypothyroidism  She takes her meds regularly and only seldomly skips a dose, about once per week. Pt has a h/o thyroidectomy    HTN HPI:  -  Her blood pressure has been controlled at home.  Pt is checking it at home.   BP was 148/70 at recent endo OV on 10-19-17. Pt has cardiologist Dr. Martinique and has an appt with him July 2019.  - Patient reports good compliance with blood pressure medications  - Denies  medication S-E   - Smoking Status noted   - She denies new onset of: chest pain, exercise intolerance, shortness of breath, dizziness, visual changes, headache, lower extremity swelling or claudication.   Last 3 blood pressure readings in our office are as follows: BP Readings from Last 3 Encounters:  11/29/17 137/74  10/19/17 (!) 148/70  09/13/17 138/72    Filed Weights   11/29/17 1042  Weight: 171 lb 4.8 oz (77.7 kg)   Mood Per pt she does not feel depressed or anxious outside of the normal. She declines meds at this time.   DM HPI:  She was seen by endocrinologist, Dr. Loanne Drilling, on 10-19-17 who increased her tresiba to 25 units qd and will follow up in 2 months.   -  She has not been working on diet and exercise for diabetes.   She walks only rarely and does not exercise regularly. She states she is too busy with her everyday life and cannot exercise.   Pt is currently maintained on the following medications for diabetes:   see med list today  Medication compliance - Per pt, if her BS is 107 in the mornings, she does not want to take her tresiba as prescribed because of SE of lows she has had in the past.   Home glucose readings range- she is not checking her BS and is not as concerned with her DM2 at home.    Denies polyuria/polydipsia. Denies hypo/ hyperglycemia symptoms - She denies new onset of: chest pain, exercise intolerance, shortness of breath, dizziness, visual changes, headache, lower extremity swelling or claudication.   Last diabetic eye exam was No results found for: HMDIABEYEEXA  Foot exam- UTD  Last A1C in the office was:  Lab Results  Component Value Date   HGBA1C 8.7 (H) 11/22/2017   HGBA1C 8.3 09/13/2017   HGBA1C 8.2 (H) 06/21/2017    Lab Results  Component Value Date   MICROALBUR 80 11/29/2017   LDLCALC 60 06/21/2017   CREATININE 1.13 (H) 11/22/2017      Last 3 blood pressure readings in our office are as follows: BP Readings from Last  3 Encounters:  11/29/17 137/74  10/19/17 (!) 148/70  09/13/17 138/72    BMI Readings from Last 3 Encounters:  11/29/17 28.51 kg/m  10/19/17 28.02 kg/m  09/13/17 27.62 kg/m     Problem  Diabetes Mellitus Due to Underlying Condition With Stage 3 Chronic Kidney Disease, Without Long-Term Current Use of Insulin ([facility])      Patient Care Team    Relationship Specialty Notifications Start End  Mellody Dance, DO PCP - General Family Medicine  06/12/16   Martinique, Peter M, [place] Physician Cardiology  07/22/16   Maisie Fus, MD Consulting Physician Obstetrics and Gynecology  87/56/43   Leighton Ruff, MD Consulting Physician General Surgery  07/22/16   Armandina Gemma, MD Consulting Physician General Surgery  07/22/16   Daryll Brod, [place] Physician Orthopedic Surgery  07/22/16      Patient Active Problem List  Diagnosis Date Noted  . Diabetes mellitus due to underlying condition with stage 3 chronic kidney disease, without long-term current use of insulin ([place]) 11/29/2017    Priority: High  . Diabetes mellitus due to underlying condition with microalbuminuria, with long-term current use of insulin ([place]) 03/11/2017    Priority: High  . Hypertension associated with diabetes ([place]) 09/27/2014    Priority: High  . Hyperlipidemia associated with type 2 diabetes mellitus ([place]) 11/12/2011    Priority: High  . Old inferior wall myocardial infarction 08/29/2011    Priority: High  . Hemorrhoids, internal, with bleeding 05/08/2013    Priority: Medium  . Hx of thyroidectomy 09/13/2017  . Noncompliance with diet and medication regimen 09/13/2017  . Stress and adjustment reaction 06/11/2017  . Strain of latissimus dorsi muscle 06/11/2017  . History of vitamin D deficiency 03/11/2017  . Other fatigue 03/11/2017  . Abscess 09/03/2016  . Hypothyroidism, postsurgical 07/22/2016  . Coronary artery disease 11/12/2011     Past Medical History:  Diagnosis Date  .  Arthritis   . CAD (coronary artery disease) CARDIOLOGIST-  DR Martinique   08-28-2011 Inferior STEMI with BMS to the RCA, complicated by cardiogenic shock, VF, VDRF , temporary CHB and abrupt reocclusion of the RCA stent treated with a second BMS to the RCA. Has residual LAD disease. EF is 50%  . Diverticulosis of colon   . Hemorrhoids, internal, with bleeding   . Hyperlipidemia   . Hypertension   . Hypothyroidism, postsurgical   . S/p bare metal coronary artery stent    2013  . Type 2 diabetes mellitus ([place])   . Wears hearing aid    both ears     Past Surgical History:  Procedure Laterality Date  . CARDIOVASCULAR STRESS TEST  10-03-2011  dr Martinique   normal perfusion / no ischemia/ ef 77%  . CARPAL TUNNEL RELEASE Right 01/25/2013   Procedure: CARPAL TUNNEL RELEASE;  Surgeon: Wynonia Sours, MD;  Location: [place];  Service: Orthopedics;  Laterality: Right;  . CARPAL TUNNEL RELEASE Left 10/25/2013   Procedure: LEFT CARPAL TUNNEL RELEASE;  Surgeon: Wynonia Sours, MD;  Location: [place];  Service: Orthopedics;  Laterality: Left;  . CATARACT EXTRACTION W/ INTRAOCULAR LENS  IMPLANT, BILATERAL  2007  . COLONOSCOPY  04-27-2003  . CORONARY ANGIOGRAM Left 08/28/2011   Procedure: CORONARY ANGIOGRAM;  Surgeon: Peter M Martinique, MD;  Location: [facility] CATH LAB;  Service: Cardiovascular;  Laterality: Left;  . CORONARY ANGIOPLASTY WITH STENT PLACEMENT  08-28-2011  dr Martinique   BMS to [facility] with abrupt reocclusion treated with second BMS/  LAD ostial 90%, proximal50-60%, first diagonal 80-90%  . LEFT HEART CATHETERIZATION WITH CORONARY ANGIOGRAM N/A 08/28/2011   Procedure: LEFT HEART CATHETERIZATION WITH CORONARY ANGIOGRAM;  Surgeon: Peter M Martinique, MD;  Location: [facility] CATH LAB;  Service: Cardiovascular;  Laterality: N/A;  . PERCUTANEOUS CORONARY STENT INTERVENTION (PCI-S) Left 08/28/2011   Procedure: PERCUTANEOUS CORONARY STENT INTERVENTION (PCI-S);  Surgeon: Peter M Martinique, MD;  Location:  [facility] CATH LAB;  Service: Cardiovascular;  Laterality: Left;  . TOTAL THYROIDECTOMY Bilateral 05-02-2007   multinodular goiter  . TRANSANAL HEMORRHOIDAL DEARTERIALIZATION N/A 03/28/2014   Procedure: TRANSANAL HEMORRHOIDAL DEARTERIALIZATION;  Surgeon: Leighton Ruff, MD;  Location: [facility];  Service: General;  Laterality: N/A;  . TRANSTHORACIC ECHOCARDIOGRAM  08-30-2011   mild LVH/ ef 01-75%/  grade I diastolic dysfunction  . TRIGGER FINGER RELEASE Right 01/25/2013   Procedure: RELEASE TRIGGER FINGER/A-1 PULLEY RIGHT  INDEX FINGER;  Surgeon: Wynonia Sours, MD;  Location: [place];  Service: Orthopedics;  Laterality: Right;  . TRIGGER FINGER RELEASE Left 10/25/2013   Procedure: RELEASE A-1 PULLEY LEFT MIDDLE FINGER;  Surgeon: Wynonia Sours, MD;  Location: [employer];  Service: Orthopedics;  Laterality: Left;  . TRIGGER FINGER RELEASE Left 12/04/2014   Procedure: RELEASE TRIGGER FINGER/A-1 PULLEY LEFT RING FINGER;  Surgeon: Daryll Brod, MD;  Location: [place];  Service: Orthopedics;  Laterality: Left;  . TRIGGER FINGER RELEASE Right 03/30/2017   Procedure: RELEASE TRIGGER FINGER/A-1 PULLEY RIGHT RING FINGER AND RIGHT SMALL FINGER;  Surgeon: Daryll Brod, MD;  Location: [place];  Service: Orthopedics;  Laterality: Right;     Family History  Problem Relation Age of Onset  . Stroke Mother   . Cancer Mother        breast  . Cancer Father        lung  . Stroke Father   . Diabetes Neg Hx      Social History   Substance and Sexual Activity  Drug Use No  ,  Social History   Substance and Sexual Activity  Alcohol Use Yes  . Alcohol/week: 0.6 oz  . Types: 1 Glasses of wine per week   Comment: social  ,  Social History   Tobacco Use  Smoking Status Never Smoker  Smokeless Tobacco Never Used  ,    Current Outpatient Medications on File Prior to Visit  Medication Sig Dispense Refill  . aspirin 81 MG tablet Take  81 mg by mouth daily.    . calcium carbonate (CALCIUM 600) 1500 (600 Ca) MG TABS tablet Take 600 mg of elemental calcium by mouth daily with breakfast.    . Coenzyme Q10 200 MG TABS Take 1 tablet by mouth daily.    . Cyanocobalamin (B-12) 2500 MCG TABS Take 1 tablet by mouth daily.    . insulin degludec (TRESIBA) 100 UNIT/ML SOPN FlexTouch Pen Inject 0.25 mLs (25 Units total) into the skin daily. And pen needles 1/day 5 pen 3  . loperamide (IMODIUM) 2 MG capsule Take by mouth as needed for diarrhea or loose stools.    Marland Kitchen losartan (COZAAR) 25 MG tablet Take 1 tablet (25 mg total) every morning by mouth. 90 tablet 3  . metoprolol tartrate (LOPRESSOR) 50 MG tablet TAKE 1 TABLET BY MOUTH 2 TIMES DAILY. 180 tablet 2  . Multiple Vitamin (MULTIVITAMIN) tablet Take 1 tablet by mouth daily.    . potassium chloride SA (K-DUR,KLOR-CON) 20 MEQ tablet TAKE 1 TABLET BY MOUTH DAILY. 90 tablet 3  . rosuvastatin (CRESTOR) 20 MG tablet TAKE 1 TABLET BY MOUTH DAILY. 90 tablet 2  . S-Adenosylmethionine (SAM-E) 400 MG TABS Take 1 tablet by mouth daily.    Marland Kitchen SYNTHROID 150 MCG tablet Take 1 tablet (150 mcg total) by mouth daily before breakfast. 30 tablet 6  . TRUETRACK TEST test strip Check your blood sugar every morning and 2-hour postprandial after the largest meal of the day 180 each 5  . vitamin C (ASCORBIC ACID) 500 MG tablet Take 500 mg by mouth daily.    Marland Kitchen VITAMIN E PO Take 500 mg by mouth daily.     No current facility-administered medications on file prior to visit.      Allergies  Allergen Reactions  . Lisinopril Cough  . Tetracyclines & Related Other (See Comments)    Severe stomach cramps     Review  of Systems:   General:  Denies fever, chills Optho/Auditory:   Denies visual changes, blurred vision Respiratory:   Denies SOB, cough, wheeze, DIB  Cardiovascular:   Denies chest pain, palpitations, painful respirations Gastrointestinal:   Denies nausea, vomiting, diarrhea.  Endocrine:     Denies  new hot or cold intolerance Musculoskeletal:  Denies joint swelling, gait issues, or new unexplained myalgias/ arthralgias Skin:  Denies rash, suspicious lesions  Neurological:    Denies dizziness, unexplained weakness, numbness  Psychiatric/Behavioral:   Denies mood changes    Objective:     Blood pressure 137/74, pulse 70, height 5\' 5"  (1.651 m), weight 171 lb 4.8 oz (77.7 kg), SpO2 97 %.  Body mass index is 28.51 kg/m.  General: Well Developed, well nourished, and in no acute distress.  HEENT: Normocephalic, atraumatic, pupils equal round reactive to light, neck supple, No carotid bruits, no JVD Skin: Warm and dry, cap RF less 2 sec Cardiac: Regular rate and rhythm, S1, S2 WNL's, no murmurs rubs or gallops Respiratory: ECTA B/L, Not using accessory muscles, speaking in full sentences. NeuroM-Sk: Ambulates w/o assistance, moves ext * 4 w/o difficulty, sensation grossly intact.  Ext: scant edema b/l lower ext Psych: No HI/SI, judgement and insight good, Euthymic mood. Full Affect.

## 2017-11-29 NOTE — Patient Instructions (Signed)
Make sure to follow-up with endocrinology in 2 months from when you last saw him per his notes.  Please bring in your blood sugar log and ask him about weight gain with insulin and see if he recommends another medicine which can possibly counteract those effects.  -Please try to walk 10-20 minutes daily.  This would be extremely important for your physical as well as mental well-being.  I would also give you more energy.  Please realize, EXERCISE IS MEDICINE!  -  American Heart Association [facility]) guidelines for exercise : If you are in good health, without any medical conditions, you should engage in 150 minutes of moderate intensity aerobic activity per week.  This means you should be huffing and puffing throughout your workout.   Engaging in regular exercise will improve brain function and memory, as well as improve mood, boost immune system and help with weight management.  As well as the other, more well-known effects of exercise such as decreasing blood sugar levels, decreasing blood pressure,  and decreasing bad cholesterol levels/ increasing good cholesterol levels.     -  The AHA strongly endorses consumption of a diet that contains a variety of foods from all the food categories with an emphasis on fruits and vegetables; fat-free and low-fat dairy products; cereal and grain products; legumes and nuts; and fish, poultry, and/or extra lean meats.    Excessive food intake, especially of foods high in saturated and trans fats, sugar, and salt, should be avoided.    Adequate water intake of roughly 1/2 of your weight in pounds, should equal the ounces of water per day you should drink.  So for instance, if you're 200 pounds, that would be 100 ounces of water per day.         Mediterranean Diet  Why follow it? Research shows. . Those who follow the Mediterranean diet have a reduced risk of heart disease  . The diet is associated with a reduced incidence of Parkinson's and Alzheimer's  diseases . People following the diet may have longer life expectancies and lower rates of chronic diseases  . The Dietary Guidelines for Americans recommends the Mediterranean diet as an eating plan to promote health and prevent disease  What Is the Mediterranean Diet?  . Healthy eating plan based on typical foods and recipes of Mediterranean-style cooking . The diet is primarily a plant based diet; these foods should make up a majority of meals   Starches - Plant based foods should make up a majority of meals - They are an important sources of vitamins, minerals, energy, antioxidants, and fiber - Choose whole grains, foods high in fiber and minimally processed items  - Typical grain sources include wheat, oats, barley, corn, brown rice, bulgar, farro, millet, polenta, couscous  - Various types of beans include chickpeas, lentils, fava beans, black beans, white beans   Fruits  Veggies - Large quantities of antioxidant rich fruits & veggies; 6 or more servings  - Vegetables can be eaten raw or lightly drizzled with oil and cooked  - Vegetables common to the traditional Mediterranean Diet include: artichokes, arugula, beets, broccoli, brussel sprouts, cabbage, carrots, celery, collard greens, cucumbers, eggplant, kale, leeks, lemons, lettuce, mushrooms, okra, onions, peas, peppers, potatoes, pumpkin, radishes, rutabaga, shallots, spinach, sweet potatoes, turnips, zucchini - Fruits common to the Mediterranean Diet include: apples, apricots, avocados, cherries, clementines, dates, figs, grapefruits, grapes, melons, nectarines, oranges, peaches, pears, pomegranates, strawberries, tangerines  Fats - Replace butter and margarine with healthy oils,  such as olive oil, canola oil, and tahini  - Limit nuts to no more than a handful a day  - Nuts include walnuts, almonds, pecans, pistachios, pine nuts  - Limit or avoid candied, honey roasted or heavily salted nuts - Olives are central to the Mediterranean  diet - can be eaten whole or used in a variety of dishes   Meats Protein - Limiting red meat: no more than a few times a month - When eating red meat: choose lean cuts and keep the portion to the size of deck of cards - Eggs: approx. 0 to 4 times a week  - Fish and lean poultry: at least 2 a week  - Healthy protein sources include, chicken, [place], lean beef, lamb - Increase intake of seafood such as tuna, salmon, trout, mackerel, shrimp, scallops - Avoid or limit high fat processed meats such as sausage and bacon  Dairy - Include moderate amounts of low fat dairy products  - Focus on healthy dairy such as fat free yogurt, skim milk, low or reduced fat cheese - Limit dairy products higher in fat such as whole or 2% milk, cheese, ice cream  Alcohol - Moderate amounts of red wine is ok  - No more than 5 oz daily for women (all ages) and men older than age 3  - No more than 10 oz of wine daily for men younger than 19  Other - Limit sweets and other desserts  - Use herbs and spices instead of salt to flavor foods  - Herbs and spices common to the traditional Mediterranean Diet include: basil, bay leaves, chives, cloves, cumin, fennel, garlic, lavender, marjoram, mint, oregano, parsley, pepper, rosemary, sage, savory, sumac, tarragon, thyme   It's not just a diet, it's a lifestyle:  . The Mediterranean diet includes lifestyle factors typical of those in the region  . Foods, drinks and meals are best eaten with others and savored . Daily physical activity is important for overall good health . This could be strenuous exercise like running and aerobics . This could also be more leisurely activities such as walking, housework, yard-work, or taking the stairs . Moderation is the key; a balanced and healthy diet accommodates most foods and drinks . Consider portion sizes and frequency of consumption of certain foods   Meal Ideas & Options:  . Breakfast:  o Whole wheat toast or whole wheat English  muffins with peanut butter & hard boiled egg o Steel cut oats topped with apples & cinnamon and skim milk  o Fresh fruit: banana, strawberries, melon, berries, peaches  o Smoothies: strawberries, bananas, greek yogurt, peanut butter o Low fat greek yogurt with blueberries and granola  o Egg white omelet with spinach and mushrooms o Breakfast couscous: whole wheat couscous, apricots, skim milk, cranberries  . Sandwiches:  o Hummus and grilled vegetables (peppers, zucchini, squash) on whole wheat bread   o Grilled chicken on whole wheat pita with lettuce, tomatoes, cucumbers or tzatziki  o Tuna salad on whole wheat bread: tuna salad made with greek yogurt, olives, red peppers, capers, green onions o Garlic rosemary lamb pita: lamb sauted with garlic, rosemary, salt & pepper; add lettuce, cucumber, greek yogurt to pita - flavor with lemon juice and black pepper  . Seafood:  o Mediterranean grilled salmon, seasoned with garlic, basil, parsley, lemon juice and black pepper o Shrimp, lemon, and spinach whole-grain pasta salad made with low fat greek yogurt  o Seared scallops with lemon orzo  o  Seared tuna steaks seasoned salt, pepper, coriander topped with tomato mixture of olives, tomatoes, olive oil, minced garlic, parsley, green onions and cappers  . Meats:  o Herbed greek chicken salad with kalamata olives, cucumber, feta  o Red bell peppers stuffed with spinach, bulgur, lean ground beef (or lentils) & topped with feta   o Kebabs: skewers of chicken, tomatoes, onions, zucchini, squash  o [place] burgers: made with red onions, mint, dill, lemon juice, feta cheese topped with roasted red peppers . Vegetarian o Cucumber salad: cucumbers, artichoke hearts, celery, red onion, feta cheese, tossed in olive oil & lemon juice  o Hummus and whole grain pita points with a greek salad (lettuce, tomato, feta, olives, cucumbers, red onion) o Lentil soup with celery, carrots made with vegetable broth,  garlic, salt and pepper  o Tabouli salad: parsley, bulgur, mint, scallions, cucumbers, tomato, radishes, lemon juice, olive oil, salt and pepper.    Diabetes Mellitus and Standards of Medical Care  Managing diabetes (diabetes mellitus) can be complicated. Your diabetes treatment may be managed by a team of health care providers, including:  A diet and nutrition specialist (registered dietitian).  A nurse.  A certified diabetes educator (CDE).  A diabetes specialist (endocrinologist).  An eye doctor.  A primary care provider.  A dentist.  Your health care providers follow a schedule in order to help you get the best quality of care. The following schedule is a general guideline for your diabetes management plan. Your health care providers may also give you more specific instructions.  HbA1c (hemoglobin A1c) test This test provides information about blood sugar (glucose) control over the previous 2-3 months. It is used to check whether your diabetes management plan needs to be adjusted.  If you are meeting your treatment goals, this test is done at least 2 times a year.  If you are not meeting treatment goals or if your treatment goals have changed, this test is done 4 times a year.  Blood pressure test  This test is done at every routine medical visit. For most people, the goal is less than 130/80. Ask your health care provider what your goal blood pressure should be.  Dental and eye exams  Visit your dentist two times a year.  If you have type 1 diabetes, get an eye exam 3-5 years after you are diagnosed, and then once a year after your first exam. ? If you were diagnosed with type 1 diabetes as a child, get an eye exam when you are age 33 or older and have had diabetes for 3-5 years. After the first exam, you should get an eye exam once a year.  If you have type 2 diabetes, have an eye exam as soon as you are diagnosed, and then once a year after your first exam.  Foot  care exam  Visual foot exams are done at every routine medical visit. The exams check for cuts, bruises, redness, blisters, sores, or other problems with the feet.  A complete foot exam is done by your health care provider once a year. This exam includes an inspection of the structure and skin of your feet, and a check of the pulses and sensation in your feet. ? Type 1 diabetes: Get your first exam 3-5 years after diagnosis. ? Type 2 diabetes: Get your first exam as soon as you are diagnosed.  Check your feet every day for cuts, bruises, redness, blisters, or sores. If you have any of these or other problems  that are not healing, contact your health care provider.  Kidney function test (urine microalbumin)  This test is done once a year. ? Type 1 diabetes: Get your first test 5 years after diagnosis. ? Type 2 diabetes: Get your first test as soon as you are diagnosed._  If you have chronic kidney disease (CKD), get a serum creatinine and estimated glomerular filtration rate (eGFR) test once a year.  Lipid profile (cholesterol, HDL, LDL, triglycerides)  This test should be done when you are diagnosed with diabetes, and every 5 years after the first test. If you are on medicines to lower your cholesterol, you may need to get this test done every year. ? The goal for LDL is less than 100 mg/dL (5.5 mmol/L). If you are at high risk, the goal is less than 70 mg/dL (3.9 mmol/L). ? The goal for HDL is 40 mg/dL (2.2 mmol/L) for men and 50 mg/dL(2.8 mmol/L) for women. An HDL cholesterol of 60 mg/dL (3.3 mmol/L) or higher gives some protection against heart disease. ? The goal for triglycerides is less than 150 mg/dL (8.3 mmol/L).  Immunizations  The yearly flu (influenza) vaccine is recommended for everyone 6 months or older who has diabetes.  The pneumonia (pneumococcal) vaccine is recommended for everyone 2 years or older who has diabetes. If you are 78 or older, you may get the pneumonia  vaccine as a series of two separate shots.  The hepatitis B vaccine is recommended for adults shortly after they have been diagnosed with diabetes.  The Tdap (tetanus, diphtheria, and pertussis) vaccine should be given: ? According to normal childhood vaccination schedules, for children. ? Every 10 years, for adults who have diabetes.  The shingles vaccine is recommended for people who have had chicken pox and are 50 years or older.  Mental and emotional health  Screening for symptoms of eating disorders, anxiety, and depression is recommended at the time of diagnosis and afterward as needed. If your screening shows that you have symptoms (you have a positive screening result), you may need further evaluation and be referred to a mental health care provider.  Diabetes self-management education  Education about how to manage your diabetes is recommended at diagnosis and ongoing as needed.  Treatment plan  Your treatment plan will be reviewed at every medical visit.  Summary  Managing diabetes (diabetes mellitus) can be complicated. Your diabetes treatment may be managed by a team of health care providers.  Your health care providers follow a schedule in order to help you get the best quality of care.  Standards of care including having regular physical exams, blood tests, blood pressure monitoring, immunizations, screening tests, and education about how to manage your diabetes.  Your health care providers may also give you more specific instructions based on your individual health.      Type 2 Diabetes Mellitus, Self Care, Adult Caring for yourself after you have been diagnosed with type 2 diabetes (type 2 diabetes mellitus) means keeping your blood sugar (glucose) under control with a balance of:  Nutrition.  Exercise.  Lifestyle changes.  Medicines or insulin, if necessary.  Support from your team of health care providers and others.  The following information  explains what you need to know to manage your diabetes at home. What do I need to do to manage my blood glucose?  Check your blood glucose every day, as often as told by your health care provider.  Contact your health care provider if your blood glucose  is above your target for 2 tests in a row.  Have your A1c (hemoglobin A1c) level checked at least two times a year, or as often as told by your health care provider. Your health care provider will set individualized treatment goals for you. Generally, the goal of treatment is to maintain the following blood glucose levels:  Before meals (preprandial): 80-130 mg/dL (4.4-7.2 mmol/L).  After meals (postprandial): below 180 mg/dL (10 mmol/L).  A1c level: less than 7%.  What do I need to know about hyperglycemia and hypoglycemia? What is hyperglycemia? Hyperglycemia, also called high blood glucose, occurs when blood glucose is too high.Make sure you know the early signs of hyperglycemia, such as:  Increased thirst.  Hunger.  Feeling very tired.  Needing to urinate more often than usual.  Blurry vision.  What is hypoglycemia? Hypoglycemia, also called low blood glucose, occurswith a blood glucose level at or below 70 mg/dL (3.9 mmol/L). The risk for hypoglycemia increases during or after exercise, during sleep, during illness, and when skipping meals or not eating for a long time (fasting). It is important to know the symptoms of hypoglycemia and treat it right away. Always have a 15-gram rapid-acting carbohydrate snack with you to treat low blood glucose. Family members and close friends should also know the symptoms and should understand how to treat hypoglycemia, in case you are not able to treat yourself. What are the symptoms of hypoglycemia? Hypoglycemia symptoms can include:  Hunger.  Anxiety.  Sweating and feeling clammy.  Confusion.  Dizziness or feeling light-headed.  Sleepiness.  Nausea.  Increased heart  rate.  Headache.  Blurry vision.  Seizure.  Nightmares.  Tingling or numbness around the mouth, lips, or tongue.  A change in speech.  Decreased ability to concentrate.  A change in coordination.  Restless sleep.  Tremors or shakes.  Fainting.  Irritability.  How do I treat hypoglycemia?  If you are alert and able to swallow safely, follow the 15:15 rule:  Take 15 grams of a rapid-acting carbohydrate. Rapid-acting options include: ? 1 tube of glucose gel. ? 3 glucose pills. ? 6-8 pieces of hard candy. ? 4 oz (120 mL) of fruit juice. ? 4 oz (120 mL) of regular (not diet) soda.  Check your blood glucose 15 minutes after you take the carbohydrate.  If the repeat blood glucose level is still at or below 70 mg/dL (3.9 mmol/L), take 15 grams of a carbohydrate again.  If your blood glucose level does not increase above 70 mg/dL (3.9 mmol/L) after 3 tries, seek emergency medical care.  After your blood glucose level returns to normal, eat a meal or a snack within 1 hour.  How do I treat severe hypoglycemia? Severe hypoglycemia is when your blood glucose level is at or below 54 mg/dL (3 mmol/L). Severe hypoglycemia is an emergency. Do not wait to see if the symptoms will go away. Get medical help right away. Call your local emergency services (911 in the [place] Do not drive yourself to the hospital. If you have severe hypoglycemia and you cannot eat or drink, you may need an injection of glucagon. A family member or close friend should learn how to check your blood glucose and how to give you a glucagon injection. Ask your health care provider if you need to have an emergency glucagon injection kit available. Severe hypoglycemia may need to be treated in a hospital. The treatment may include getting glucose through an IV tube. You may also need treatment for  the cause of your hypoglycemia. Can having diabetes put me at risk for other conditions? Having diabetes can put you at  risk for other long-term (chronic) conditions, such as heart disease and kidney disease. Your health care provider may prescribe medicines to help prevent complications from diabetes. These medicines may include:  Aspirin.  Medicine to lower cholesterol.  Medicine to control blood pressure.  What else can I do to manage my diabetes? Take your diabetes medicines as told  If your health care provider prescribed insulin or diabetes medicines, take them every day.  Do not run out of insulin or other diabetes medicines that you take. Plan ahead so you always have these available.  If you use insulin, adjust your dosage based on how physically active you are and what foods you eat. Your health care provider will tell you how to adjust your dosage. Make healthy food choices  The things that you eat and drink affect your blood glucose and your insulin dosage. Making good choices helps to control your diabetes and prevent other health problems. A healthy meal plan includes eating lean proteins, complex carbohydrates, fresh fruits and vegetables, low-fat dairy products, and healthy fats. Make an appointment to see a diet and nutrition specialist (registered dietitian) to help you create an eating plan that is right for you. Make sure that you:  Follow instructions from your health care provider about eating or drinking restrictions.  Drink enough fluid to keep your urine clear or pale yellow.  Eat healthy snacks between nutritious meals.  Track the carbohydrates that you eat. Do this by reading food labels and learning the standard serving sizes of foods.  Follow your sick day plan whenever you cannot eat or drink as usual. Make this plan in advance with your health care provider.  Stay active  Exercise regularly, as told by your health care provider. This may include:  Stretching and doing strength exercises, such as yoga or weightlifting, at least 2 times a week.  Doing at least 150  minutes of moderate-intensity or vigorous-intensity exercise each week. This could be brisk walking, biking, or water aerobics. ? Spread out your activity over at least 3 days of the week. ? Do not go more than 2 days in a row without doing some kind of physical activity.  When you start a new exercise or activity, work with your health care provider to adjust your insulin, medicines, or food intake as needed. Make healthy lifestyle choices  Do not use any tobacco products, such as cigarettes, chewing tobacco, and e-cigarettes. If you need help quitting, ask your health care provider.  If your health care provider says that alcohol is safe for you, limit alcohol intake to no more than 1 drink per day for nonpregnant women and 2 drinks per day for men. One drink equals 12 oz of beer, 5 oz of wine, or 1 oz of hard liquor.  Learn to manage stress. If you need help with this, ask your health care provider. Care for your body   Keep your immunizations up to date. In addition to getting vaccinations as told by your health care provider, it is recommended that you get vaccinated against the following illnesses: ? The flu (influenza). Get a flu shot every year. ? Pneumonia. ? Hepatitis B.  Schedule an eye exam soon after your diagnosis, and then one time every year after that.  Check your skin and feet every day for cuts, bruises, redness, blisters, or sores. Schedule a foot  exam with your health care provider once every year.  Brush your teeth and gums two times a day, and floss at least one time a day. Visit your dentist at least once every 6 months.  Maintain a healthy weight. General instructions  Take over-the-counter and prescription medicines only as told by your health care provider.  Share your diabetes management plan with people in your workplace, school, and household.  Check your urine for ketones when you are ill and as told by your health care provider.  Ask your health  care provider: ? Do I need to meet with a diabetes educator? ? Where can I find a support group for people with diabetes?  Carry a medical alert card or wear medical alert jewelry.  Keep all follow-up visits as told by your health care provider. This is important. Where to find more information: For more information about diabetes, visit:  American Diabetes Association (ADA): www.diabetes.org  American Association of Diabetes Educators (AADE): www.diabeteseducator.org/patient-resources  This information is not intended to replace advice given to you by your health care provider. Make sure you discuss any questions you have with your health care provider. Document Released: 11/04/2015 Document Revised: 12/19/2015 Document Reviewed: 08/16/2015 Elsevier Interactive Patient Education  2017 [employer].      Blood Glucose Monitoring, Adult Monitoring your blood sugar (glucose) helps you manage your diabetes. It also helps you and your health care provider determine how well your diabetes management plan is working. Blood glucose monitoring involves checking your blood glucose as often as directed, and keeping a record (log) of your results over time. Why should I monitor my blood glucose? Checking your blood glucose regularly can:  Help you understand how food, exercise, illnesses, and medicines affect your blood glucose.  Let you know what your blood glucose is at any time. You can quickly tell if you are having low blood glucose (hypoglycemia) or high blood glucose (hyperglycemia).  Help you and your health care provider adjust your medicines as needed.  When should I check my blood glucose? Follow instructions from your health care provider about how often to check your blood glucose.   This may depend on:  The type of diabetes you have.  How well-controlled your diabetes is.  Medicines you are taking.  If you have type 1 diabetes:  Check your blood glucose at least 2  times a day.  Also check your blood glucose: ? Before every insulin injection. ? Before and after exercise. ? Between meals. ? 2 hours after a meal. ? Occasionally between 2:00 a.m. and 3:00 a.m., as directed. ? Before potentially dangerous tasks, like driving or using heavy machinery. ? At bedtime.  You may need to check your blood glucose more often, up to 6-10 times a day: ? If you use an insulin pump. ? If you need multiple daily injections (MDI). ? If your diabetes is not well-controlled. ? If you are ill. ? If you have a history of severe hypoglycemia. ? If you have a history of not knowing when your blood glucose is getting low (hypoglycemia unawareness).  If you have type 2 diabetes:  If you take insulin or other diabetes medicines, check your blood glucose at least 2 times a day.  If you are on intensive insulin therapy, check your blood glucose at least 4 times a day. Occasionally, you may also need to check between 2:00 a.m. and 3:00 a.m., as directed.  Also check your blood glucose: ? Before and after exercise. ?  Before potentially dangerous tasks, like driving or using heavy machinery.  You may need to check your blood glucose more often if: ? Your medicine is being adjusted. ? Your diabetes is not well-controlled. ? You are ill.  What is a blood glucose log?  A blood glucose log is a record of your blood glucose readings. It helps you and your health care provider: ? Look for patterns in your blood glucose over time. ? Adjust your diabetes management plan as needed.  Every time you check your blood glucose, write down your result and notes about things that may be affecting your blood glucose, such as your diet and exercise for the day.  Most glucose meters store a record of glucose readings in the meter. Some meters allow you to download your records to a computer. How do I check my blood glucose? Follow these steps to get accurate readings of your blood  glucose: Supplies needed   Blood glucose meter.  Test strips for your meter. Each meter has its own strips. You must use the strips that come with your meter.  A needle to prick your finger (lancet). Do not use lancets more than once.  A device that holds the lancet (lancing device).  A journal or log book to write down your results.  Procedure  Wash your hands with soap and water.  Prick the side of your finger (not the tip) with the lancet. Use a different finger each time.  Gently rub the finger until a small drop of blood appears.  Follow instructions that come with your meter for inserting the test strip, applying blood to the strip, and using your blood glucose meter.  Write down your result and any notes.  Alternative testing sites  Some meters allow you to use areas of your body other than your finger (alternative sites) to test your blood.  If you think you may have hypoglycemia, or if you have hypoglycemia unawareness, do not use alternative sites. Use your finger instead.  Alternative sites may not be as accurate as the fingers, because blood flow is slower in these areas. This means that the result you get may be delayed, and it may be different from the result that you would get from your finger.  The most common alternative sites are: ? Forearm. ? Thigh. ? Palm of the hand.  Additional tips  Always keep your supplies with you.  If you have questions or need help, all blood glucose meters have a 24-hour "hotline" number that you can call. You may also contact your health care provider.  After you use a few boxes of test strips, adjust (calibrate) your blood glucose meter by following instructions that came with your meter.    The American Diabetes Association suggests the following targets for most nonpregnant adults with diabetes.  More or less stringent glycemic goals may be appropriate for each individual.  A1C: Less than 7% A1C may also be reported  as eAG: Less than 154 mg/dl Before a meal (preprandial plasma glucose): 80-130 mg/dl 1-2 hours after beginning of the meal (Postprandial plasma glucose)*: Less than 180 mg/dl  *Postprandial glucose may be targeted if A1C goals are not met despite reaching preprandial glucose goals.   GOALS in short:  The goals are for the Hgb A1C to be less than 7.0 & blood pressure to be less than 130/80.    It is recommended that all diabetics are educated on and follow a healthy diabetic diet, exercise for 30  minutes 3-4 times per week (walking, biking, swimming, or machine), monitor blood glucose readings and bring that record with you to be reviewed at your next office visit.     You should be checking fasting blood sugars- especially after you eat poorly or eat really healthy, and also check 2 hour postprandial blood sugars after largest meal of the day.    Write these down and bring in your log at each office visit.    You will need to be seen every 3 months by the provider managing your Diabetes unless told otherwise by that provider.   You will need yearly eye exams from an eye specialist and foot exams to check the nerves of your feet.  Also, your urine should be checked yearly as well to make sure excess protein is not present.   If you are checking your blood pressure at home, please record it and bring it to your next office visit.    Follow the Dietary Approaches to Stop Hypertension (DASH) diet (3 servings of fruit and vegetables daily, whole grains, low sodium, low-fat proteins).  See below.    Lastly, when it comes to your cholesterol, the goal is to have the HDL (good cholesterol) >40, and the LDL (bad cholesterol) <100.   It is recommended that you follow a heart healthy, low saturated and trans-fat diet and exercise for 30 minutes at least 5 times a week.     (( Check out the DASH diet = 1.5 Gram Low Sodium Diet   A 1.5 gram sodium diet restricts the amount of sodium in the diet to no  more than 1.5 g or 1500 mg daily.  The American Heart Association recommends Americans over the age of 67 to consume no more than 1500 mg of sodium each day to reduce the risk of developing high blood pressure.  Research also shows that limiting sodium may reduce heart attack and stroke risk.  Many foods contain sodium for flavor and sometimes as a preservative.  When the amount of sodium in a diet needs to be low, it is important to know what to look for when choosing foods and drinks.  The following includes some information and guidelines to help make it easier for you to adapt to a low sodium diet.    QUICK TIPS  Do not add salt to food.  Avoid convenience items and fast food.  Choose unsalted snack foods.  Buy lower sodium products, often labeled as "lower sodium" or "no salt added."  Check food labels to learn how much sodium is in 1 serving.  When eating at a restaurant, ask that your food be prepared with less salt or none, if possible.    READING FOOD LABELS FOR SODIUM INFORMATION  The nutrition facts label is a good place to find how much sodium is in foods. Look for products with no more than 400 mg of sodium per serving.  Remember that 1.5 g = 1500 mg.  The food label may also list foods as:  Sodium-free: Less than 5 mg in a serving.  Very low sodium: 35 mg or less in a serving.  Low-sodium: 140 mg or less in a serving.  Light in sodium: 50% less sodium in a serving. For example, if a food that usually has 300 mg of sodium is changed to become light in sodium, it will have 150 mg of sodium.  Reduced sodium: 25% less sodium in a serving. For example, if a food that usually  has 400 mg of sodium is changed to reduced sodium, it will have 300 mg of sodium.    CHOOSING FOODS  Grains  Avoid: Salted crackers and snack items. Some cereals, including instant hot cereals. Bread stuffing and biscuit mixes. Seasoned rice or pasta mixes.  Choose: Unsalted snack items. Low-sodium cereals,  oats, puffed wheat and rice, shredded wheat. English muffins and bread. Pasta.  Meats  Avoid: Salted, canned, smoked, spiced, pickled meats, including fish and poultry. Bacon, ham, sausage, cold cuts, hot dogs, anchovies.  Choose: Low-sodium canned tuna and salmon. Fresh or frozen meat, poultry, and fish.  Dairy  Avoid: Processed cheese and spreads. Cottage cheese. Buttermilk and condensed milk. Regular cheese.  Choose: Milk. Low-sodium cottage cheese. Yogurt. Sour cream. Low-sodium cheese.  Fruits and Vegetables  Avoid: Regular canned vegetables. Regular canned tomato sauce and paste. Frozen vegetables in sauces. Olives. Angie Fava. Relishes. Sauerkraut.  Choose: Low-sodium canned vegetables. Low-sodium tomato sauce and paste. Frozen or fresh vegetables. Fresh and frozen fruit.  Condiments  Avoid: Canned and packaged gravies. Worcestershire sauce. Tartar sauce. Barbecue sauce. Soy sauce. Steak sauce. Ketchup. Onion, garlic, and table salt. Meat flavorings and tenderizers.  Choose: Fresh and dried herbs and spices. Low-sodium varieties of mustard and ketchup. Lemon juice. Tabasco sauce. Horseradish.    SAMPLE 1.5 GRAM SODIUM MEAL PLAN:   Breakfast / Sodium (mg)  1 cup low-fat milk / 143 mg  1 whole-wheat English muffin / 240 mg  1 tbs heart-healthy margarine / 153 mg  1 hard-boiled egg / 139 mg  1 small orange / 0 mg  Lunch / Sodium (mg)  1 cup raw carrots / 76 mg  2 tbs no salt added peanut butter / 5 mg  2 slices whole-wheat bread / 270 mg  1 tbs jelly / 6 mg   cup red grapes / 2 mg  Dinner / Sodium (mg)  1 cup whole-wheat pasta / 2 mg  1 cup low-sodium tomato sauce / 73 mg  3 oz lean ground beef / 57 mg  1 small side salad (1 cup raw spinach leaves,  cup cucumber,  cup yellow bell pepper) with 1 tsp olive oil and 1 tsp red wine vinegar / 25 mg  Snack / Sodium (mg)  1 container low-fat vanilla yogurt / 107 mg  3 graham cracker squares / 127 mg  Nutrient Analysis  Calories: 1745   Protein: 75 g  Carbohydrate: 237 g  Fat: 57 g  Sodium: 1425 mg  Document Released: 07/13/2005 Document Revised: 03/25/2011 Document Reviewed: 10/14/2009  ExitCare Patient Information 2012 [place].))    This information is not intended to replace advice given to you by your health care provider. Make sure you discuss any questions you have with your health care provider. Document Released: 07/16/2003 Document Revised: 01/31/2016 Document Reviewed: 12/23/2015 Elsevier Interactive Patient Education  2017 [employer].

## 2018-01-04 ENCOUNTER — Other Ambulatory Visit: Payer: Self-pay | Admitting: Cardiology

## 2018-01-04 ENCOUNTER — Other Ambulatory Visit: Payer: Self-pay | Admitting: Adult Health

## 2018-01-05 DIAGNOSIS — R194 Change in bowel habit: Secondary | ICD-10-CM | POA: Diagnosis not present

## 2018-01-20 DIAGNOSIS — N819 Female genital prolapse, unspecified: Secondary | ICD-10-CM | POA: Diagnosis not present

## 2018-01-20 DIAGNOSIS — N760 Acute vaginitis: Secondary | ICD-10-CM | POA: Diagnosis not present

## 2018-01-22 NOTE — Progress Notes (Signed)
Alicia Medina Date of Birth: Dec 15, 1932 Medical Record #[id number]  History of Present Illness: Alicia Medina is seen today for follow up CAD. She has had an inferior MI treated with BMS to the RCA in February 2013. She required defibrillation multiple times and her hospitalization was complicated by cardiogenic shock, VF, VDRF and CHB requiring a temporary pacemaker. She had abrupt occlusion of the RCA stent that was treated with a 2nd BMS to the RCA.Followup nuclear stress testing in March 2013 showed normal perfusion throughout with ejection fraction of 76%.  In September 2018 she underwent tendon release on her right ring and small finger.   On followup today she is doing very OK. Really isn't exercising much. Blood sugars are not doing well and she is going to see an endocrinologist.  She denies any chest pain or SOB. Husband has issues with lymphoma and Afib.   Current Outpatient Medications on File Prior to Visit  Medication Sig Dispense Refill  . aspirin 81 MG tablet Take 81 mg by mouth daily.    . Blood Glucose Monitoring Suppl (TRUE METRIX METER) w/Device KIT by Does not apply route as directed.    . calcium carbonate (CALCIUM 600) 1500 (600 Ca) MG TABS tablet Take 600 mg of elemental calcium by mouth daily with breakfast.    . Coenzyme Q10 200 MG TABS Take 1 tablet by mouth daily.    . Cyanocobalamin (B-12) 2500 MCG TABS Take 1 tablet by mouth daily.    . insulin degludec (TRESIBA) 100 UNIT/ML SOPN FlexTouch Pen Inject 0.25 mLs (25 Units total) into the skin daily. And pen needles 1/day 5 pen 3  . loperamide (IMODIUM) 2 MG capsule Take by mouth as needed for diarrhea or loose stools.    Marland Kitchen losartan (COZAAR) 25 MG tablet Take 1 tablet (25 mg total) every morning by mouth. 90 tablet 3  . metoprolol tartrate (LOPRESSOR) 50 MG tablet TAKE 1 TABLET BY MOUTH 2 TIMES DAILY. 180 tablet 2  . Multiple Vitamin (MULTIVITAMIN) tablet Take 1 tablet by mouth daily.    . potassium chloride SA  (K-DUR,KLOR-CON) 20 MEQ tablet TAKE 1 TABLET BY MOUTH DAILY. 90 tablet 3  . rosuvastatin (CRESTOR) 20 MG tablet TAKE 1 TABLET BY MOUTH DAILY. 90 tablet 2  . S-Adenosylmethionine (SAM-E) 400 MG TABS Take 1 tablet by mouth daily.    Marland Kitchen SYNTHROID 150 MCG tablet TAKE 1 TABLET (150 MCG TOTAL) BY MOUTH DAILY BEFORE BREAKFAST. 90 tablet 1  . vitamin C (ASCORBIC ACID) 500 MG tablet Take 500 mg by mouth daily.    Marland Kitchen VITAMIN E PO Take 500 mg by mouth daily.     No current facility-administered medications on file prior to visit.     Allergies  Allergen Reactions  . Lisinopril Cough  . Tetracyclines & Related Other (See Comments)    Severe stomach cramps    Past Medical History:  Diagnosis Date  . Arthritis   . CAD (coronary artery disease) CARDIOLOGIST-  DR [place]   08-28-2011 Inferior STEMI with BMS to the RCA, complicated by cardiogenic shock, VF, VDRF , temporary CHB and abrupt reocclusion of the RCA stent treated with a second BMS to the RCA. Has residual LAD disease. EF is 50%  . Diverticulosis of colon   . Hemorrhoids, internal, with bleeding   . Hyperlipidemia   . Hypertension   . Hypothyroidism, postsurgical   . S/p bare metal coronary artery stent    2013  . Type 2 diabetes mellitus (  [place])   . Wears hearing aid    both ears    Past Surgical History:  Procedure Laterality Date  . CARDIOVASCULAR STRESS TEST  10-03-2011  dr [place]   normal perfusion / no ischemia/ ef 77%  . CARPAL TUNNEL RELEASE Right 01/25/2013   Procedure: CARPAL TUNNEL RELEASE;  Surgeon: Wynonia Sours, MD;  Location: [place];  Service: Orthopedics;  Laterality: Right;  . CARPAL TUNNEL RELEASE Left 10/25/2013   Procedure: LEFT CARPAL TUNNEL RELEASE;  Surgeon: Wynonia Sours, MD;  Location: [place];  Service: Orthopedics;  Laterality: Left;  . CATARACT EXTRACTION W/ INTRAOCULAR LENS  IMPLANT, BILATERAL  2007  . COLONOSCOPY  04-27-2003  . CORONARY ANGIOGRAM Left 08/28/2011   Procedure:  CORONARY ANGIOGRAM;  Surgeon: Bilan Tedesco M [place], MD;  Location: [facility] CATH LAB;  Service: Cardiovascular;  Laterality: Left;  . CORONARY ANGIOPLASTY WITH STENT PLACEMENT  08-28-2011  dr [place]   BMS to [facility] with abrupt reocclusion treated with second BMS/  LAD ostial 90%, proximal50-60%, first diagonal 80-90%  . LEFT HEART CATHETERIZATION WITH CORONARY ANGIOGRAM N/A 08/28/2011   Procedure: LEFT HEART CATHETERIZATION WITH CORONARY ANGIOGRAM;  Surgeon: Kezia Benevides M [place], MD;  Location: [facility] CATH LAB;  Service: Cardiovascular;  Laterality: N/A;  . PERCUTANEOUS CORONARY STENT INTERVENTION (PCI-S) Left 08/28/2011   Procedure: PERCUTANEOUS CORONARY STENT INTERVENTION (PCI-S);  Surgeon: Lashonda Sonneborn M [place], MD;  Location: [facility] CATH LAB;  Service: Cardiovascular;  Laterality: Left;  . TOTAL THYROIDECTOMY Bilateral 05-02-2007   multinodular goiter  . TRANSANAL HEMORRHOIDAL DEARTERIALIZATION N/A 03/28/2014   Procedure: TRANSANAL HEMORRHOIDAL DEARTERIALIZATION;  Surgeon: Leighton Ruff, MD;  Location: [facility];  Service: General;  Laterality: N/A;  . TRANSTHORACIC ECHOCARDIOGRAM  08-30-2011   mild LVH/ ef 13-08%/  grade I diastolic dysfunction  . TRIGGER FINGER RELEASE Right 01/25/2013   Procedure: RELEASE TRIGGER FINGER/A-1 PULLEY RIGHT INDEX FINGER;  Surgeon: Wynonia Sours, MD;  Location: [place];  Service: Orthopedics;  Laterality: Right;  . TRIGGER FINGER RELEASE Left 10/25/2013   Procedure: RELEASE A-1 PULLEY LEFT MIDDLE FINGER;  Surgeon: Wynonia Sours, MD;  Location: [place];  Service: Orthopedics;  Laterality: Left;  . TRIGGER FINGER RELEASE Left 12/04/2014   Procedure: RELEASE TRIGGER FINGER/A-1 PULLEY LEFT RING FINGER;  Surgeon: Daryll Brod, MD;  Location: [place];  Service: Orthopedics;  Laterality: Left;  . TRIGGER FINGER RELEASE Right 03/30/2017   Procedure: RELEASE TRIGGER FINGER/A-1 PULLEY RIGHT RING FINGER AND RIGHT SMALL FINGER;  Surgeon: Daryll Brod, MD;   Location: [place];  Service: Orthopedics;  Laterality: Right;    Social History   Tobacco Use  Smoking Status Never Smoker  Smokeless Tobacco Never Used    Social History   Substance and Sexual Activity  Alcohol Use Yes  . Alcohol/week: 0.6 oz  . Types: 1 Glasses of wine per week   Comment: social    Family History  Problem Relation Age of Onset  . Stroke Mother   . Cancer Mother        breast  . Cancer Father        lung  . Stroke Father   . Diabetes Neg Hx     Review of Systems: The review of systems is per the HPI.  All other systems were reviewed and are negative.  Physical Exam: BP 126/68   Pulse 66   Ht _0  (1.651 m)   Wt 174 lb (78.9 kg)  BMI 28.96 kg/m  GENERAL:  Well appearing overweight WF in NAD HEENT:  PERRL, EOMI, sclera are clear. Oropharynx is clear. NECK:  No jugular venous distention, carotid upstroke brisk and symmetric, no bruits, no thyromegaly or adenopathy LUNGS:  Clear to auscultation bilaterally CHEST:  Unremarkable HEART:  RRR,  PMI not displaced or sustained,S1 and S2 within normal limits, no S3, no S4: no clicks, no rubs, no murmurs ABD:  Soft, nontender. BS +, no masses or bruits. No hepatomegaly, no splenomegaly EXT:  2 + pulses throughout, no edema, no cyanosis no clubbing SKIN:  Warm and dry.  No rashes NEURO:  Alert and oriented x 3. Cranial nerves II through XII intact. PSYCH:  Cognitively intact   LABORATORY DATA: Lab Results  Component Value Date   WBC 5.6 03/16/2017   HGB 11.8 03/16/2017   HCT 37.4 03/16/2017   PLT 333 03/16/2017   GLUCOSE 154 (H) 01/24/2018   CHOL 156 01/24/2018   TRIG 98 01/24/2018   HDL 51 01/24/2018   LDLCALC 85 01/24/2018   ALT 20 01/24/2018   AST 19 01/24/2018   NA 141 01/24/2018   K 5.0 01/24/2018   CL 103 01/24/2018   CREATININE 1.04 (H) 01/24/2018   BUN 12 01/24/2018   CO2 25 01/24/2018   TSH 2.240 11/22/2017   INR 1.31 09/01/2011   HGBA1C 8.7 (H) 11/22/2017    MICROALBUR 80 11/29/2017   Ecg today shows NSR with normal Ecg. I have personally reviewed and interpreted this study.  Assessment / Plan: 1. Status post acute inferior myocardial infarction in February 2013 with multiple complications. S/p BMS to RCA. She continues to do very well and is asymptomatic.  Followup nuclear stress test March 2013 was normal.  No angina. Continue ASA and statin.  2. Hyperlipidemia LDL has increased from 60 to 85. This may be partly related to poor diabetic control and weight gain. Focus on increasing aerobic activity and losing weight. Continue Crestor 20 mg daily.   3. Hypertension, well controlled.  4. Diabetes mellitus type 2  A1c 8.3%.  Now off metformin due to GI distress. Follow up with endocrinology.

## 2018-01-24 ENCOUNTER — Telehealth: Payer: Self-pay | Admitting: Cardiology

## 2018-01-24 DIAGNOSIS — E785 Hyperlipidemia, unspecified: Secondary | ICD-10-CM

## 2018-01-24 DIAGNOSIS — Z5181 Encounter for therapeutic drug level monitoring: Secondary | ICD-10-CM

## 2018-01-24 DIAGNOSIS — I10 Essential (primary) hypertension: Secondary | ICD-10-CM | POA: Diagnosis not present

## 2018-01-24 NOTE — Telephone Encounter (Signed)
Patient here for labs Ordered LP/CMET in Epic

## 2018-01-24 NOTE — Telephone Encounter (Signed)
New message   Patient requesting order for labs

## 2018-01-25 ENCOUNTER — Ambulatory Visit (INDEPENDENT_AMBULATORY_CARE_PROVIDER_SITE_OTHER): Payer: Medicare Other | Admitting: Cardiology

## 2018-01-25 ENCOUNTER — Encounter: Payer: Self-pay | Admitting: Cardiology

## 2018-01-25 VITALS — BP 126/68 | HR 66 | Ht 65.0 in | Wt 174.0 lb

## 2018-01-25 DIAGNOSIS — E119 Type 2 diabetes mellitus without complications: Secondary | ICD-10-CM | POA: Diagnosis not present

## 2018-01-25 DIAGNOSIS — Z794 Long term (current) use of insulin: Secondary | ICD-10-CM | POA: Diagnosis not present

## 2018-01-25 DIAGNOSIS — E785 Hyperlipidemia, unspecified: Secondary | ICD-10-CM | POA: Diagnosis not present

## 2018-01-25 DIAGNOSIS — I10 Essential (primary) hypertension: Secondary | ICD-10-CM

## 2018-01-25 DIAGNOSIS — I2510 Atherosclerotic heart disease of native coronary artery without angina pectoris: Secondary | ICD-10-CM

## 2018-01-25 LAB — LIPID PANEL
CHOL/HDL RATIO: 3.1 ratio (ref 0.0–4.4)
Cholesterol, Total: 156 mg/dL (ref 100–199)
HDL: 51 mg/dL (ref >39)
LDL CALC: 85 mg/dL (ref 0–99)
Triglycerides: 98 mg/dL (ref 0–149)
VLDL CHOLESTEROL CAL: 20 mg/dL (ref 5–40)

## 2018-01-25 LAB — COMPREHENSIVE METABOLIC PANEL
ALBUMIN: 4.3 g/dL (ref 3.5–4.7)
ALK PHOS: 43 IU/L (ref 39–117)
ALT: 20 IU/L (ref 0–32)
AST: 19 IU/L (ref 0–40)
Albumin/Globulin Ratio: 1.4 (ref 1.2–2.2)
BILIRUBIN TOTAL: 0.4 mg/dL (ref 0.0–1.2)
BUN/Creatinine Ratio: 12 (ref 12–28)
BUN: 12 mg/dL (ref 8–27)
CHLORIDE: 103 mmol/L (ref 96–106)
CO2: 25 mmol/L (ref 20–29)
CREATININE: 1.04 mg/dL — AB (ref 0.57–1.00)
Calcium: 9.6 mg/dL (ref 8.7–10.3)
GFR calc Af Amer: 57 mL/min/{1.73_m2} — ABNORMAL LOW (ref >59)
GFR calc non Af Amer: 50 mL/min/{1.73_m2} — ABNORMAL LOW (ref >59)
GLUCOSE: 154 mg/dL — AB (ref 65–99)
Globulin, Total: 3 g/dL (ref 1.5–4.5)
Potassium: 5 mmol/L (ref 3.5–5.2)
Sodium: 141 mmol/L (ref 134–144)
Total Protein: 7.3 g/dL (ref 6.0–8.5)

## 2018-01-25 NOTE — Patient Instructions (Addendum)
You need to increase your aerobic activity and try and lose weight  We will continue your current therapy  I will follow up in about 6 months. You should receive a letter in 4 months to call and schedule, if not please call the office [phone]

## 2018-02-02 DIAGNOSIS — N819 Female genital prolapse, unspecified: Secondary | ICD-10-CM | POA: Diagnosis not present

## 2018-02-08 ENCOUNTER — Telehealth: Payer: Self-pay | Admitting: Endocrinology

## 2018-02-08 ENCOUNTER — Ambulatory Visit (INDEPENDENT_AMBULATORY_CARE_PROVIDER_SITE_OTHER): Payer: Medicare Other | Admitting: Endocrinology

## 2018-02-08 ENCOUNTER — Encounter: Payer: Self-pay | Admitting: Endocrinology

## 2018-02-08 VITALS — BP 126/60 | HR 90 | Ht 65.0 in | Wt 173.0 lb

## 2018-02-08 DIAGNOSIS — I2510 Atherosclerotic heart disease of native coronary artery without angina pectoris: Secondary | ICD-10-CM

## 2018-02-08 DIAGNOSIS — E1122 Type 2 diabetes mellitus with diabetic chronic kidney disease: Secondary | ICD-10-CM | POA: Diagnosis not present

## 2018-02-08 DIAGNOSIS — N183 Chronic kidney disease, stage 3 unspecified: Secondary | ICD-10-CM

## 2018-02-08 DIAGNOSIS — Z794 Long term (current) use of insulin: Secondary | ICD-10-CM | POA: Diagnosis not present

## 2018-02-08 LAB — POCT GLYCOSYLATED HEMOGLOBIN (HGB A1C): Hemoglobin A1C: 8.2 % — AB (ref 4.0–5.6)

## 2018-02-08 MED ORDER — INSULIN ISOPHANE HUMAN 100 UNIT/ML KWIKPEN: 25.0000 [IU] | PEN_INJECTOR | SUBCUTANEOUS | 11 refills | Status: DC

## 2018-02-08 NOTE — Patient Instructions (Addendum)
check your blood sugar 4 times a day.  vary the time of day when you check, between before the 3 meals, and at bedtime.  also check if you have symptoms of your blood sugar being too high or too low.  please keep a record of the readings and bring it to your next appointment here (or you can bring the meter itself).  You can write it on any piece of paper.  please call us sooner if your blood sugar goes below 70, or if you have a lot of readings over 200. Please change the tresiba to NPH, 25 units daily.  You should skip 1 day in between the 2, to let the [place] leave your system Please come back for a follow-up appointment in 2 months.

## 2018-02-08 NOTE — Progress Notes (Signed)
Subjective:    Patient ID: Alicia Medina, female    DOB: 05-15-1933, 84 y.o.   MRN: [id number]  HPI  Pt returns for f/u of diabetes mellitus: DM type: Insulin-requiring type 2 Dx'ed: 3013 Complications: CAD and renal insuff Therapy: insulin since 2013 GDM: never DKA: never Severe hypoglycemia: never Pancreatitis: never Pancreatic imaging: never Other: she declines multiple daily injections; She did not tolerate metformin (diarrhea) Interval history: she brings a record of her cbg's which I have reviewed today.  It varies from 79-193.  She checks fasting Past Medical History:  Diagnosis Date  . Arthritis   . CAD (coronary artery disease) CARDIOLOGIST-  DR Martinique   08-28-2011 Inferior STEMI with BMS to the RCA, complicated by cardiogenic shock, VF, VDRF , temporary CHB and abrupt reocclusion of the RCA stent treated with a second BMS to the RCA. Has residual LAD disease. EF is 50%  . Diverticulosis of colon   . Hemorrhoids, internal, with bleeding   . Hyperlipidemia   . Hypertension   . Hypothyroidism, postsurgical   . S/p bare metal coronary artery stent    2013  . Type 2 diabetes mellitus ([place])   . Wears hearing aid    both ears    Past Surgical History:  Procedure Laterality Date  . CARDIOVASCULAR STRESS TEST  10-03-2011  dr Martinique   normal perfusion / no ischemia/ ef 77%  . CARPAL TUNNEL RELEASE Right 01/25/2013   Procedure: CARPAL TUNNEL RELEASE;  Surgeon: Wynonia Sours, MD;  Location: [place];  Service: Orthopedics;  Laterality: Right;  . CARPAL TUNNEL RELEASE Left 10/25/2013   Procedure: LEFT CARPAL TUNNEL RELEASE;  Surgeon: Wynonia Sours, MD;  Location: [place];  Service: Orthopedics;  Laterality: Left;  . CATARACT EXTRACTION W/ INTRAOCULAR LENS  IMPLANT, BILATERAL  2007  . COLONOSCOPY  04-27-2003  . CORONARY ANGIOGRAM Left 08/28/2011   Procedure: CORONARY ANGIOGRAM;  Surgeon: Peter M Martinique, MD;  Location: [facility] CATH LAB;  Service:  Cardiovascular;  Laterality: Left;  . CORONARY ANGIOPLASTY WITH STENT PLACEMENT  08-28-2011  dr Martinique   BMS to [facility] with abrupt reocclusion treated with second BMS/  LAD ostial 90%, proximal50-60%, first diagonal 80-90%  . LEFT HEART CATHETERIZATION WITH CORONARY ANGIOGRAM N/A 08/28/2011   Procedure: LEFT HEART CATHETERIZATION WITH CORONARY ANGIOGRAM;  Surgeon: Peter M Martinique, MD;  Location: [facility] CATH LAB;  Service: Cardiovascular;  Laterality: N/A;  . PERCUTANEOUS CORONARY STENT INTERVENTION (PCI-S) Left 08/28/2011   Procedure: PERCUTANEOUS CORONARY STENT INTERVENTION (PCI-S);  Surgeon: Peter M Martinique, MD;  Location: [facility] CATH LAB;  Service: Cardiovascular;  Laterality: Left;  . TOTAL THYROIDECTOMY Bilateral 05-02-2007   multinodular goiter  . TRANSANAL HEMORRHOIDAL DEARTERIALIZATION N/A 03/28/2014   Procedure: TRANSANAL HEMORRHOIDAL DEARTERIALIZATION;  Surgeon: Leighton Ruff, MD;  Location: [facility];  Service: General;  Laterality: N/A;  . TRANSTHORACIC ECHOCARDIOGRAM  08-30-2011   mild LVH/ ef 14-38%/  grade I diastolic dysfunction  . TRIGGER FINGER RELEASE Right 01/25/2013   Procedure: RELEASE TRIGGER FINGER/A-1 PULLEY RIGHT INDEX FINGER;  Surgeon: Wynonia Sours, MD;  Location: [place];  Service: Orthopedics;  Laterality: Right;  . TRIGGER FINGER RELEASE Left 10/25/2013   Procedure: RELEASE A-1 PULLEY LEFT MIDDLE FINGER;  Surgeon: Wynonia Sours, MD;  Location: [place];  Service: Orthopedics;  Laterality: Left;  . TRIGGER FINGER RELEASE Left 12/04/2014   Procedure: RELEASE TRIGGER FINGER/A-1 PULLEY LEFT RING FINGER;  Surgeon: Daryll Brod, MD;  Location: [place];  Service: Orthopedics;  Laterality: Left;  . TRIGGER FINGER RELEASE Right 03/30/2017   Procedure: RELEASE TRIGGER FINGER/A-1 PULLEY RIGHT RING FINGER AND RIGHT SMALL FINGER;  Surgeon: Daryll Brod, MD;  Location: [place];  Service: Orthopedics;  Laterality: Right;     Social History   Socioeconomic History  . Marital status: Married    Spouse name: Not on file  . Number of children: Not on file  . Years of education: Not on file  . Highest education level: Not on file  Occupational History  . Not on file  Social Needs  . Financial resource strain: Not on file  . Food insecurity:    Worry: Not on file    Inability: Not on file  . Transportation needs:    Medical: Not on file    Non-medical: Not on file  Tobacco Use  . Smoking status: Never Smoker  . Smokeless tobacco: Never Used  Substance and Sexual Activity  . Alcohol use: Yes    Alcohol/week: 0.6 oz    Types: 1 Glasses of wine per week    Comment: social  . Drug use: No  . Sexual activity: Not on file  Lifestyle  . Physical activity:    Days per week: Not on file    Minutes per session: Not on file  . Stress: Not on file  Relationships  . Social connections:    Talks on phone: Not on file    Gets together: Not on file    Attends religious service: Not on file    Active member of club or organization: Not on file    Attends meetings of clubs or organizations: Not on file    Relationship status: Not on file  . Intimate partner violence:    Fear of current or ex partner: Not on file    Emotionally abused: Not on file    Physically abused: Not on file    Forced sexual activity: Not on file  Other Topics Concern  . Not on file  Social History Narrative   Lives with husband.   Has 2 sons- both healthy.   Younger son lives in [place], [place] in order son lives in [place].    Current Outpatient Medications on File Prior to Visit  Medication Sig Dispense Refill  . aspirin 81 MG tablet Take 81 mg by mouth daily.    . Blood Glucose Monitoring Suppl (TRUE METRIX METER) w/Device KIT by Does not apply route as directed.    . calcium carbonate (CALCIUM 600) 1500 (600 Ca) MG TABS tablet Take 600 mg of elemental calcium by mouth daily with breakfast.    . Coenzyme Q10 200 MG TABS  Take 1 tablet by mouth daily.    . Cyanocobalamin (B-12) 2500 MCG TABS Take 1 tablet by mouth daily.    Marland Kitchen loperamide (IMODIUM) 2 MG capsule Take by mouth as needed for diarrhea or loose stools.    Marland Kitchen losartan (COZAAR) 25 MG tablet Take 1 tablet (25 mg total) every morning by mouth. 90 tablet 3  . metoprolol tartrate (LOPRESSOR) 50 MG tablet TAKE 1 TABLET BY MOUTH 2 TIMES DAILY. 180 tablet 2  . Multiple Vitamin (MULTIVITAMIN) tablet Take 1 tablet by mouth daily.    . potassium chloride SA (K-DUR,KLOR-CON) 20 MEQ tablet TAKE 1 TABLET BY MOUTH DAILY. 90 tablet 3  . rosuvastatin (CRESTOR) 20 MG tablet TAKE 1 TABLET BY MOUTH DAILY. 90 tablet 2  . S-Adenosylmethionine (SAM-E)  400 MG TABS Take 1 tablet by mouth daily.    Marland Kitchen SYNTHROID 150 MCG tablet TAKE 1 TABLET (150 MCG TOTAL) BY MOUTH DAILY BEFORE BREAKFAST. 90 tablet 1  . vitamin C (ASCORBIC ACID) 500 MG tablet Take 500 mg by mouth daily.    Marland Kitchen VITAMIN E PO Take 500 mg by mouth daily.     No current facility-administered medications on file prior to visit.     Allergies  Allergen Reactions  . Lisinopril Cough  . Tetracyclines & Related Other (See Comments)    Severe stomach cramps    Family History  Problem Relation Age of Onset  . Stroke Mother   . Cancer Mother        breast  . Cancer Father        lung  . Stroke Father   . Diabetes Neg Hx     BP 126/60   Pulse 90   Ht _0  (1.651 m)   Wt 173 lb (78.5 kg)   SpO2 96%   BMI 28.79 kg/m   Review of Systems She denies hypoglycemia.  She has gained weight.      Objective:   Physical Exam VITAL SIGNS:  See vs page GENERAL: no distress Pulses: foot pulses are intact bilaterally.   MSK: no deformity of the feet or ankles.  CV: trace bilat edema of the legs Skin:  no ulcer on the feet or ankles.  normal color and temp on the feet and ankles Neuro: sensation is intact to touch on the feet and ankles.   Ext: There is bilateral onychomycosis of the toenails  Lab Results   Component Value Date   HGBA1C 8.2 (A) 02/08/2018   Lab Results  Component Value Date   CREATININE 1.04 (H) 01/24/2018   BUN 12 01/24/2018   NA 141 01/24/2018   K 5.0 01/24/2018   CL 103 01/24/2018   CO2 25 01/24/2018       Assessment & Plan:  Insulin-requiring type 2 DM, with CAD.  She needs increased rx. Renal insuff: she needs a faster-acting qd insulin.   Patient Instructions  check your blood sugar 4 times a day.  vary the time of day when you check, between before the 3 meals, and at bedtime.  also check if you have symptoms of your blood sugar being too high or too low.  please keep a record of the readings and bring it to your next appointment here (or you can bring the meter itself).  You can write it on any piece of paper.  please call us sooner if your blood sugar goes below 70, or if you have a lot of readings over 200. Please change the tresiba to NPH, 25 units daily.  You should skip 1 day in between the 2, to let the [place] leave your system Please come back for a follow-up appointment in 2 months.

## 2018-02-08 NOTE — Telephone Encounter (Signed)
[facility] ph# [phone] called re: RX for Humalin N is not covered by insurance. Novolin N is covered. Please contact pharmacy to advise.

## 2018-02-08 NOTE — Telephone Encounter (Signed)
Is levemir pen covered?

## 2018-02-08 NOTE — Telephone Encounter (Signed)
Ok to change

## 2018-02-09 MED ORDER — INSULIN DETEMIR 100 UNIT/ML FLEXPEN: 30.0000 [IU] | PEN_INJECTOR | SUBCUTANEOUS | 11 refills | Status: DC

## 2018-02-09 NOTE — Telephone Encounter (Signed)
I called [facility] they stated that if we send in prescription for Levemir they can leave it as a test claim to see if it is covered. Can you advise & I will send in??

## 2018-02-09 NOTE — Addendum Note (Signed)
Addended by: Renato Shin on: 02/09/2018 02:11 PM   Modules accepted: Orders

## 2018-02-09 NOTE — Telephone Encounter (Signed)
Ok, I have sent a prescription to your pharmacy 

## 2018-02-11 ENCOUNTER — Other Ambulatory Visit: Payer: Self-pay | Admitting: Obstetrics & Gynecology

## 2018-02-11 DIAGNOSIS — Z1231 Encounter for screening mammogram for malignant neoplasm of breast: Secondary | ICD-10-CM

## 2018-03-11 ENCOUNTER — Ambulatory Visit
Admission: RE | Admit: 2018-03-11 | Discharge: 2018-03-11 | Disposition: A | Discharge status: Home / Self Care | Payer: Medicare Other | Source: Ambulatory Visit | Attending: Obstetrics & Gynecology | Admitting: Obstetrics & Gynecology

## 2018-03-11 DIAGNOSIS — Z1231 Encounter for screening mammogram for malignant neoplasm of breast: Secondary | ICD-10-CM | POA: Diagnosis not present

## 2018-04-04 ENCOUNTER — Ambulatory Visit: Payer: Medicare Other | Admitting: Family Medicine

## 2018-04-12 ENCOUNTER — Ambulatory Visit: Payer: Medicare Other | Admitting: Endocrinology

## 2018-04-20 ENCOUNTER — Ambulatory Visit (INDEPENDENT_AMBULATORY_CARE_PROVIDER_SITE_OTHER): Payer: Medicare Other | Admitting: Endocrinology

## 2018-04-20 ENCOUNTER — Encounter: Payer: Self-pay | Admitting: Endocrinology

## 2018-04-20 VITALS — BP 142/100 | HR 101 | Ht 65.0 in | Wt 172.0 lb

## 2018-04-20 DIAGNOSIS — I2510 Atherosclerotic heart disease of native coronary artery without angina pectoris: Secondary | ICD-10-CM

## 2018-04-20 DIAGNOSIS — Z794 Long term (current) use of insulin: Secondary | ICD-10-CM

## 2018-04-20 DIAGNOSIS — N183 Chronic kidney disease, stage 3 (moderate): Secondary | ICD-10-CM | POA: Diagnosis not present

## 2018-04-20 DIAGNOSIS — E1122 Type 2 diabetes mellitus with diabetic chronic kidney disease: Secondary | ICD-10-CM

## 2018-04-20 LAB — POCT GLYCOSYLATED HEMOGLOBIN (HGB A1C): HEMOGLOBIN A1C: 8.2 % — AB (ref 4.0–5.6)

## 2018-04-20 MED ORDER — INSULIN DETEMIR 100 UNIT/ML FLEXPEN: 35.0000 [IU] | PEN_INJECTOR | SUBCUTANEOUS | 11 refills | Status: DC

## 2018-04-20 NOTE — Progress Notes (Signed)
Subjective:    Patient ID: Alicia Medina, female    DOB: [birth year], 84 y.o.   MRN: [id number]  HPI Pt returns for f/u of diabetes mellitus: DM type: Insulin-requiring type 2 Dx'ed: 7341 Complications: CAD and renal insuff Therapy: insulin since 2013 GDM: never DKA: never Severe hypoglycemia: never Pancreatitis: never Pancreatic imaging: never Other: she declines multiple daily injections; She did not tolerate metformin (diarrhea); she changed tresiba to NPH, due to the pattern of cbg's.   Interval history: she brings a record of her cbg's which I have reviewed today.  It varies from 120-256.  It is in general slightly higher as the day goes on.  pt states she feels well in general.   Past Medical History:  Diagnosis Date  . Arthritis   . CAD (coronary artery disease) CARDIOLOGIST-  DR Martinique   08-28-2011 Inferior STEMI with BMS to the RCA, complicated by cardiogenic shock, VF, VDRF , temporary CHB and abrupt reocclusion of the RCA stent treated with a second BMS to the RCA. Has residual LAD disease. EF is 50%  . Diverticulosis of colon   . Hemorrhoids, internal, with bleeding   . Hyperlipidemia   . Hypertension   . Hypothyroidism, postsurgical   . S/p bare metal coronary artery stent    2013  . Type 2 diabetes mellitus ([place])   . Wears hearing aid    both ears    Past Surgical History:  Procedure Laterality Date  . CARDIOVASCULAR STRESS TEST  10-03-2011  dr Martinique   normal perfusion / no ischemia/ ef 77%  . CARPAL TUNNEL RELEASE Right 01/25/2013   Procedure: CARPAL TUNNEL RELEASE;  Surgeon: Wynonia Sours, MD;  Location: [place];  Service: Orthopedics;  Laterality: Right;  . CARPAL TUNNEL RELEASE Left 10/25/2013   Procedure: LEFT CARPAL TUNNEL RELEASE;  Surgeon: Wynonia Sours, MD;  Location: [place];  Service: Orthopedics;  Laterality: Left;  . CATARACT EXTRACTION W/ INTRAOCULAR LENS  IMPLANT, BILATERAL  2007  . COLONOSCOPY  04-27-2003  .  CORONARY ANGIOGRAM Left 08/28/2011   Procedure: CORONARY ANGIOGRAM;  Surgeon: Peter M Martinique, MD;  Location: [facility] CATH LAB;  Service: Cardiovascular;  Laterality: Left;  . CORONARY ANGIOPLASTY WITH STENT PLACEMENT  08-28-2011  dr Martinique   BMS to [facility] with abrupt reocclusion treated with second BMS/  LAD ostial 90%, proximal50-60%, first diagonal 80-90%  . LEFT HEART CATHETERIZATION WITH CORONARY ANGIOGRAM N/A 08/28/2011   Procedure: LEFT HEART CATHETERIZATION WITH CORONARY ANGIOGRAM;  Surgeon: Peter M Martinique, MD;  Location: [facility] CATH LAB;  Service: Cardiovascular;  Laterality: N/A;  . PERCUTANEOUS CORONARY STENT INTERVENTION (PCI-S) Left 08/28/2011   Procedure: PERCUTANEOUS CORONARY STENT INTERVENTION (PCI-S);  Surgeon: Peter M Martinique, MD;  Location: [facility] CATH LAB;  Service: Cardiovascular;  Laterality: Left;  . TOTAL THYROIDECTOMY Bilateral 05-02-2007   multinodular goiter  . TRANSANAL HEMORRHOIDAL DEARTERIALIZATION N/A 03/28/2014   Procedure: TRANSANAL HEMORRHOIDAL DEARTERIALIZATION;  Surgeon: Leighton Ruff, MD;  Location: [facility];  Service: General;  Laterality: N/A;  . TRANSTHORACIC ECHOCARDIOGRAM  08-30-2011   mild LVH/ ef 93-79%/  grade I diastolic dysfunction  . TRIGGER FINGER RELEASE Right 01/25/2013   Procedure: RELEASE TRIGGER FINGER/A-1 PULLEY RIGHT INDEX FINGER;  Surgeon: Wynonia Sours, MD;  Location: [place];  Service: Orthopedics;  Laterality: Right;  . TRIGGER FINGER RELEASE Left 10/25/2013   Procedure: RELEASE A-1 PULLEY LEFT MIDDLE FINGER;  Surgeon: Wynonia Sours, MD;  [facility];  Service: Orthopedics;  Laterality: Left;  . TRIGGER FINGER RELEASE Left 12/04/2014   Procedure: RELEASE TRIGGER FINGER/A-1 PULLEY LEFT RING FINGER;  Surgeon: Daryll Brod, MD;  Location: [place];  Service: Orthopedics;  Laterality: Left;  . TRIGGER FINGER RELEASE Right 03/30/2017   Procedure: RELEASE TRIGGER FINGER/A-1 PULLEY RIGHT RING FINGER AND  RIGHT SMALL FINGER;  Surgeon: Daryll Brod, MD;  Location: [place];  Service: Orthopedics;  Laterality: Right;    Social History   Socioeconomic History  . Marital status: Married    Spouse name: Not on file  . Number of children: Not on file  . Years of education: Not on file  . Highest education level: Not on file  Occupational History  . Not on file  Social Needs  . Financial resource strain: Not on file  . Food insecurity:    Worry: Not on file    Inability: Not on file  . Transportation needs:    Medical: Not on file    Non-medical: Not on file  Tobacco Use  . Smoking status: Never Smoker  . Smokeless tobacco: Never Used  Substance and Sexual Activity  . Alcohol use: Yes    Alcohol/week: 1.0 standard drinks    Types: 1 Glasses of wine per week    Comment: social  . Drug use: No  . Sexual activity: Not on file  Lifestyle  . Physical activity:    Days per week: Not on file    Minutes per session: Not on file  . Stress: Not on file  Relationships  . Social connections:    Talks on phone: Not on file    Gets together: Not on file    Attends religious service: Not on file    Active member of club or organization: Not on file    Attends meetings of clubs or organizations: Not on file    Relationship status: Not on file  . Intimate partner violence:    Fear of current or ex partner: Not on file    Emotionally abused: Not on file    Physically abused: Not on file    Forced sexual activity: Not on file  Other Topics Concern  . Not on file  Social History Narrative   Lives with husband.   Has 2 sons- both healthy.   Younger son lives in [place], [place] in order son lives in [place].    Current Outpatient Medications on File Prior to Visit  Medication Sig Dispense Refill  . aspirin 81 MG tablet Take 81 mg by mouth daily.    . Blood Glucose Monitoring Suppl (TRUE METRIX METER) w/Device KIT by Does not apply route as directed.    . calcium carbonate  (CALCIUM 600) 1500 (600 Ca) MG TABS tablet Take 600 mg of elemental calcium by mouth daily with breakfast.    . Coenzyme Q10 200 MG TABS Take 1 tablet by mouth daily.    . Cyanocobalamin (B-12) 2500 MCG TABS Take 1 tablet by mouth daily.    Marland Kitchen loperamide (IMODIUM) 2 MG capsule Take by mouth as needed for diarrhea or loose stools.    Marland Kitchen losartan (COZAAR) 25 MG tablet Take 1 tablet (25 mg total) every morning by mouth. 90 tablet 3  . metoprolol tartrate (LOPRESSOR) 50 MG tablet TAKE 1 TABLET BY MOUTH 2 TIMES DAILY. 180 tablet 2  . Multiple Vitamin (MULTIVITAMIN) tablet Take 1 tablet by mouth daily.    . potassium chloride SA (K-DUR,KLOR-CON)  20 MEQ tablet TAKE 1 TABLET BY MOUTH DAILY. 90 tablet 3  . rosuvastatin (CRESTOR) 20 MG tablet TAKE 1 TABLET BY MOUTH DAILY. 90 tablet 2  . S-Adenosylmethionine (SAM-E) 400 MG TABS Take 1 tablet by mouth daily.    Marland Kitchen SYNTHROID 150 MCG tablet TAKE 1 TABLET (150 MCG TOTAL) BY MOUTH DAILY BEFORE BREAKFAST. 90 tablet 1  . vitamin C (ASCORBIC ACID) 500 MG tablet Take 500 mg by mouth daily.    Marland Kitchen VITAMIN E PO Take 500 mg by mouth daily.     No current facility-administered medications on file prior to visit.     Allergies  Allergen Reactions  . Demeclocycline Other (See Comments)    Severe stomach cramps Severe stomach cramps Severe stomach cramps  . Lisinopril Cough  . Tetracyclines & Related Other (See Comments)    Severe stomach cramps    Family History  Problem Relation Age of Onset  . Stroke Mother   . Cancer Mother        breast  . Cancer Father        lung  . Stroke Father   . Diabetes Neg Hx     BP (!) 142/100   Pulse (!) 101   Ht _0  (1.651 m)   Wt 172 lb (78 kg)   SpO2 96%   BMI 28.62 kg/m    Review of Systems She denies hypoglycemia.  She has gained weight.      Objective:   Physical Exam VITAL SIGNS:  See vs page GENERAL: no distress Pulses: foot pulses are intact bilaterally.   MSK: no deformity of the feet or ankles.    CV: trace bilat edema of the legs.   Skin:  no ulcer on the feet or ankles.  normal color and temp on the feet and ankles.   Neuro: sensation is intact to touch on the feet and ankles.   Ext: There is bilateral onychomycosis of the toenails.   Lab Results  Component Value Date   HGBA1C 8.2 (A) 04/20/2018       Assessment & Plan:  HTN: is noted today Insulin-requiring type 2 DM, with CAD Renal insuff: in this context, she may need to change to NPH, but we'll try increasing levemir first.    Patient Instructions  Your blood pressure is high today.  Please see your primary care provider soon, to have it rechecked.   check your blood sugar 4 times a day.  vary the time of day when you check, between before the 3 meals, and at bedtime.  also check if you have symptoms of your blood sugar being too high or too low.  please keep a record of the readings and bring it to your next appointment here (or you can bring the meter itself).  You can write it on any piece of paper.  please call us sooner if your blood sugar goes below 70, or if you have a lot of readings over 200. Please increase the levemir to 35 each morning.   Please call or message [place] next week, to tell us how the blood sugar is doing Please come back for a follow-up appointment in 2 months.

## 2018-04-20 NOTE — Patient Instructions (Addendum)
Your blood pressure is high today.  Please see your primary care provider soon, to have it rechecked.   check your blood sugar 4 times a day.  vary the time of day when you check, between before the 3 meals, and at bedtime.  also check if you have symptoms of your blood sugar being too high or too low.  please keep a record of the readings and bring it to your next appointment here (or you can bring the meter itself).  You can write it on any piece of paper.  please call us sooner if your blood sugar goes below 70, or if you have a lot of readings over 200. Please increase the levemir to 35 each morning.   Please call or message [place] next week, to tell us how the blood sugar is doing Please come back for a follow-up appointment in 2 months.

## 2018-05-02 ENCOUNTER — Encounter: Payer: Self-pay | Admitting: Family Medicine

## 2018-05-02 ENCOUNTER — Ambulatory Visit (INDEPENDENT_AMBULATORY_CARE_PROVIDER_SITE_OTHER): Payer: Medicare Other | Admitting: Family Medicine

## 2018-05-02 VITALS — BP 128/73 | HR 78 | Ht 65.0 in | Wt 175.0 lb

## 2018-05-02 DIAGNOSIS — F4329 Adjustment disorder with other symptoms: Secondary | ICD-10-CM | POA: Diagnosis not present

## 2018-05-02 DIAGNOSIS — Z8639 Personal history of other endocrine, nutritional and metabolic disease: Secondary | ICD-10-CM | POA: Diagnosis not present

## 2018-05-02 DIAGNOSIS — R809 Proteinuria, unspecified: Secondary | ICD-10-CM | POA: Diagnosis not present

## 2018-05-02 DIAGNOSIS — E0822 Diabetes mellitus due to underlying condition with diabetic chronic kidney disease: Secondary | ICD-10-CM | POA: Diagnosis not present

## 2018-05-02 DIAGNOSIS — E0829 Diabetes mellitus due to underlying condition with other diabetic kidney complication: Secondary | ICD-10-CM | POA: Diagnosis not present

## 2018-05-02 DIAGNOSIS — Z23 Encounter for immunization: Secondary | ICD-10-CM | POA: Diagnosis not present

## 2018-05-02 DIAGNOSIS — N183 Chronic kidney disease, stage 3 unspecified: Secondary | ICD-10-CM

## 2018-05-02 DIAGNOSIS — Z9111 Patient's noncompliance with dietary regimen: Secondary | ICD-10-CM | POA: Diagnosis not present

## 2018-05-02 DIAGNOSIS — E890 Postprocedural hypothyroidism: Secondary | ICD-10-CM

## 2018-05-02 DIAGNOSIS — I10 Essential (primary) hypertension: Secondary | ICD-10-CM

## 2018-05-02 DIAGNOSIS — Z9114 Patient's other noncompliance with medication regimen: Secondary | ICD-10-CM

## 2018-05-02 DIAGNOSIS — Z794 Long term (current) use of insulin: Secondary | ICD-10-CM

## 2018-05-02 DIAGNOSIS — E1159 Type 2 diabetes mellitus with other circulatory complications: Secondary | ICD-10-CM | POA: Diagnosis not present

## 2018-05-02 DIAGNOSIS — E785 Hyperlipidemia, unspecified: Secondary | ICD-10-CM

## 2018-05-02 DIAGNOSIS — E538 Deficiency of other specified B group vitamins: Secondary | ICD-10-CM | POA: Diagnosis not present

## 2018-05-02 DIAGNOSIS — E1169 Type 2 diabetes mellitus with other specified complication: Secondary | ICD-10-CM | POA: Diagnosis not present

## 2018-05-02 DIAGNOSIS — I2510 Atherosclerotic heart disease of native coronary artery without angina pectoris: Secondary | ICD-10-CM

## 2018-05-02 MED ORDER — TETANUS-DIPHTH-ACELL PERTUSSIS 5-2.5-18.5 LF-MCG/0.5 IM SUSP: 0.5000 mL | Freq: Once | INTRAMUSCULAR | 0 refills | Status: AC

## 2018-05-02 MED ORDER — LOSARTAN POTASSIUM 25 MG PO TABS: 25.0000 mg | ORAL_TABLET | Freq: Every morning | ORAL | 3 refills | Status: DC

## 2018-05-02 NOTE — Patient Instructions (Signed)
If your fasting sugars do not consistently fall under 200 please give your endocrinologist to call as he recommended.    Also, we need to obtain a vitamin D and B12 level on you in the near future please come in and make a lab only appointment to get this done at your convenience in the near future.     Diabetes Mellitus and Standards of Medical Care  Managing diabetes (diabetes mellitus) can be complicated. Your diabetes treatment may be managed by a team of health care providers, including:  A diet and nutrition specialist (registered dietitian).  A nurse.  A certified diabetes educator (CDE).  A diabetes specialist (endocrinologist).  An eye doctor.  A primary care provider.  A dentist.  Your health care providers follow a schedule in order to help you get the best quality of care. The following schedule is a general guideline for your diabetes management plan. Your health care providers may also give you more specific instructions.  HbA1c (hemoglobin A1c) test This test provides information about blood sugar (glucose) control over the previous 2-3 months. It is used to check whether your diabetes management plan needs to be adjusted.  If you are meeting your treatment goals, this test is done at least 2 times a year.  If you are not meeting treatment goals or if your treatment goals have changed, this test is done 4 times a year.  Blood pressure test  This test is done at every routine medical visit. For most people, the goal is less than 130/80. Ask your health care provider what your goal blood pressure should be.  Dental and eye exams  Visit your dentist two times a year.  If you have type 1 diabetes, get an eye exam 3-5 years after you are diagnosed, and then once a year after your first exam. ? If you were diagnosed with type 1 diabetes as a child, get an eye exam when you are age 63 or older and have had diabetes for 3-5 years. After the first exam, you should  get an eye exam once a year.  If you have type 2 diabetes, have an eye exam as soon as you are diagnosed, and then once a year after your first exam.  Foot care exam  Visual foot exams are done at every routine medical visit. The exams check for cuts, bruises, redness, blisters, sores, or other problems with the feet.  A complete foot exam is done by your health care provider once a year. This exam includes an inspection of the structure and skin of your feet, and a check of the pulses and sensation in your feet. ? Type 1 diabetes: Get your first exam 3-5 years after diagnosis. ? Type 2 diabetes: Get your first exam as soon as you are diagnosed.  Check your feet every day for cuts, bruises, redness, blisters, or sores. If you have any of these or other problems that are not healing, contact your health care provider.  Kidney function test (urine microalbumin)  This test is done once a year. ? Type 1 diabetes: Get your first test 5 years after diagnosis. ? Type 2 diabetes: Get your first test as soon as you are diagnosed._  If you have chronic kidney disease (CKD), get a serum creatinine and estimated glomerular filtration rate (eGFR) test once a year.  Lipid profile (cholesterol, HDL, LDL, triglycerides)  This test should be done when you are diagnosed with diabetes, and every 5 years after the first  test. If you are on medicines to lower your cholesterol, you may need to get this test done every year. ? The goal for LDL is less than 100 mg/dL (5.5 mmol/L). If you are at high risk, the goal is less than 70 mg/dL (3.9 mmol/L). ? The goal for HDL is 40 mg/dL (2.2 mmol/L) for men and 50 mg/dL(2.8 mmol/L) for women. An HDL cholesterol of 60 mg/dL (3.3 mmol/L) or higher gives some protection against heart disease. ? The goal for triglycerides is less than 150 mg/dL (8.3 mmol/L).  Immunizations  The yearly flu (influenza) vaccine is recommended for everyone 6 months or older who has  diabetes.  The pneumonia (pneumococcal) vaccine is recommended for everyone 2 years or older who has diabetes. If you are 23 or older, you may get the pneumonia vaccine as a series of two separate shots.  The hepatitis B vaccine is recommended for adults shortly after they have been diagnosed with diabetes.  The Tdap (tetanus, diphtheria, and pertussis) vaccine should be given: ? According to normal childhood vaccination schedules, for children. ? Every 10 years, for adults who have diabetes.  The shingles vaccine is recommended for people who have had chicken pox and are 50 years or older.  Mental and emotional health  Screening for symptoms of eating disorders, anxiety, and depression is recommended at the time of diagnosis and afterward as needed. If your screening shows that you have symptoms (you have a positive screening result), you may need further evaluation and be referred to a mental health care provider.  Diabetes self-management education  Education about how to manage your diabetes is recommended at diagnosis and ongoing as needed.  Treatment plan  Your treatment plan will be reviewed at every medical visit.  Summary  Managing diabetes (diabetes mellitus) can be complicated. Your diabetes treatment may be managed by a team of health care providers.  Your health care providers follow a schedule in order to help you get the best quality of care.  Standards of care including having regular physical exams, blood tests, blood pressure monitoring, immunizations, screening tests, and education about how to manage your diabetes.  Your health care providers may also give you more specific instructions based on your individual health.      Type 2 Diabetes Mellitus, Self Care, Adult Caring for yourself after you have been diagnosed with type 2 diabetes (type 2 diabetes mellitus) means keeping your blood sugar (glucose) under control with a balance  of:  Nutrition.  Exercise.  Lifestyle changes.  Medicines or insulin, if necessary.  Support from your team of health care providers and others.  The following information explains what you need to know to manage your diabetes at home. What do I need to do to manage my blood glucose?  Check your blood glucose every day, as often as told by your health care provider.  Contact your health care provider if your blood glucose is above your target for 2 tests in a row.  Have your A1c (hemoglobin A1c) level checked at least two times a year, or as often as told by your health care provider. Your health care provider will set individualized treatment goals for you. Generally, the goal of treatment is to maintain the following blood glucose levels:  Before meals (preprandial): 80-130 mg/dL (4.4-7.2 mmol/L).  After meals (postprandial): below 180 mg/dL (10 mmol/L).  A1c level: less than 7%.  What do I need to know about hyperglycemia and hypoglycemia? What is hyperglycemia? Hyperglycemia, also  called high blood glucose, occurs when blood glucose is too high.Make sure you know the early signs of hyperglycemia, such as:  Increased thirst.  Hunger.  Feeling very tired.  Needing to urinate more often than usual.  Blurry vision.  What is hypoglycemia? Hypoglycemia, also called low blood glucose, occurswith a blood glucose level at or below 70 mg/dL (3.9 mmol/L). The risk for hypoglycemia increases during or after exercise, during sleep, during illness, and when skipping meals or not eating for a long time (fasting). It is important to know the symptoms of hypoglycemia and treat it right away. Always have a 15-gram rapid-acting carbohydrate snack with you to treat low blood glucose. Family members and close friends should also know the symptoms and should understand how to treat hypoglycemia, in case you are not able to treat yourself. What are the symptoms of  hypoglycemia? Hypoglycemia symptoms can include:  Hunger.  Anxiety.  Sweating and feeling clammy.  Confusion.  Dizziness or feeling light-headed.  Sleepiness.  Nausea.  Increased heart rate.  Headache.  Blurry vision.  Seizure.  Nightmares.  Tingling or numbness around the mouth, lips, or tongue.  A change in speech.  Decreased ability to concentrate.  A change in coordination.  Restless sleep.  Tremors or shakes.  Fainting.  Irritability.  How do I treat hypoglycemia?  If you are alert and able to swallow safely, follow the 15:15 rule:  Take 15 grams of a rapid-acting carbohydrate. Rapid-acting options include: ? 1 tube of glucose gel. ? 3 glucose pills. ? 6-8 pieces of hard candy. ? 4 oz (120 mL) of fruit juice. ? 4 oz (120 mL) of regular (not diet) soda.  Check your blood glucose 15 minutes after you take the carbohydrate.  If the repeat blood glucose level is still at or below 70 mg/dL (3.9 mmol/L), take 15 grams of a carbohydrate again.  If your blood glucose level does not increase above 70 mg/dL (3.9 mmol/L) after 3 tries, seek emergency medical care.  After your blood glucose level returns to normal, eat a meal or a snack within 1 hour.  How do I treat severe hypoglycemia? Severe hypoglycemia is when your blood glucose level is at or below 54 mg/dL (3 mmol/L). Severe hypoglycemia is an emergency. Do not wait to see if the symptoms will go away. Get medical help right away. Call your local emergency services (911 in the [place] Do not drive yourself to the hospital. If you have severe hypoglycemia and you cannot eat or drink, you may need an injection of glucagon. A family member or close friend should learn how to check your blood glucose and how to give you a glucagon injection. Ask your health care provider if you need to have an emergency glucagon injection kit available. Severe hypoglycemia may need to be treated in a hospital. The treatment  may include getting glucose through an IV tube. You may also need treatment for the cause of your hypoglycemia. Can having diabetes put me at risk for other conditions? Having diabetes can put you at risk for other long-term (chronic) conditions, such as heart disease and kidney disease. Your health care provider may prescribe medicines to help prevent complications from diabetes. These medicines may include:  Aspirin.  Medicine to lower cholesterol.  Medicine to control blood pressure.  What else can I do to manage my diabetes? Take your diabetes medicines as told  If your health care provider prescribed insulin or diabetes medicines, take them every day.  Do not run out of insulin or other diabetes medicines that you take. Plan ahead so you always have these available.  If you use insulin, adjust your dosage based on how physically active you are and what foods you eat. Your health care provider will tell you how to adjust your dosage. Make healthy food choices  The things that you eat and drink affect your blood glucose and your insulin dosage. Making good choices helps to control your diabetes and prevent other health problems. A healthy meal plan includes eating lean proteins, complex carbohydrates, fresh fruits and vegetables, low-fat dairy products, and healthy fats. Make an appointment to see a diet and nutrition specialist (registered dietitian) to help you create an eating plan that is right for you. Make sure that you:  Follow instructions from your health care provider about eating or drinking restrictions.  Drink enough fluid to keep your urine clear or pale yellow.  Eat healthy snacks between nutritious meals.  Track the carbohydrates that you eat. Do this by reading food labels and learning the standard serving sizes of foods.  Follow your sick day plan whenever you cannot eat or drink as usual. Make this plan in advance with your health care provider.  Stay  active  Exercise regularly, as told by your health care provider. This may include:  Stretching and doing strength exercises, such as yoga or weightlifting, at least 2 times a week.  Doing at least 150 minutes of moderate-intensity or vigorous-intensity exercise each week. This could be brisk walking, biking, or water aerobics. ? Spread out your activity over at least 3 days of the week. ? Do not go more than 2 days in a row without doing some kind of physical activity.  When you start a new exercise or activity, work with your health care provider to adjust your insulin, medicines, or food intake as needed. Make healthy lifestyle choices  Do not use any tobacco products, such as cigarettes, chewing tobacco, and e-cigarettes. If you need help quitting, ask your health care provider.  If your health care provider says that alcohol is safe for you, limit alcohol intake to no more than 1 drink per day for nonpregnant women and 2 drinks per day for men. One drink equals 12 oz of beer, 5 oz of wine, or 1 oz of hard liquor.  Learn to manage stress. If you need help with this, ask your health care provider. Care for your body   Keep your immunizations up to date. In addition to getting vaccinations as told by your health care provider, it is recommended that you get vaccinated against the following illnesses: ? The flu (influenza). Get a flu shot every year. ? Pneumonia. ? Hepatitis B.  Schedule an eye exam soon after your diagnosis, and then one time every year after that.  Check your skin and feet every day for cuts, bruises, redness, blisters, or sores. Schedule a foot exam with your health care provider once every year.  Brush your teeth and gums two times a day, and floss at least one time a day. Visit your dentist at least once every 6 months.  Maintain a healthy weight. General instructions  Take over-the-counter and prescription medicines only as told by your health care  provider.  Share your diabetes management plan with people in your workplace, school, and household.  Check your urine for ketones when you are ill and as told by your health care provider.  Ask your health care provider: ?  Do I need to meet with a diabetes educator? ? Where can I find a support group for people with diabetes?  Carry a medical alert card or wear medical alert jewelry.  Keep all follow-up visits as told by your health care provider. This is important. Where to find more information: For more information about diabetes, visit:  American Diabetes Association (ADA): www.diabetes.org  American Association of Diabetes Educators (AADE): www.diabeteseducator.org/patient-resources  This information is not intended to replace advice given to you by your health care provider. Make sure you discuss any questions you have with your health care provider. Document Released: 11/04/2015 Document Revised: 12/19/2015 Document Reviewed: 08/16/2015 Elsevier Interactive Patient Education  2017 [place].      Blood Glucose Monitoring, Adult Monitoring your blood sugar (glucose) helps you manage your diabetes. It also helps you and your health care provider determine how well your diabetes management plan is working. Blood glucose monitoring involves checking your blood glucose as often as directed, and keeping a record (log) of your results over time. Why should I monitor my blood glucose? Checking your blood glucose regularly can:  Help you understand how food, exercise, illnesses, and medicines affect your blood glucose.  Let you know what your blood glucose is at any time. You can quickly tell if you are having low blood glucose (hypoglycemia) or high blood glucose (hyperglycemia).  Help you and your health care provider adjust your medicines as needed.  When should I check my blood glucose? Follow instructions from your health care provider about how often to check your  blood glucose.   This may depend on:  The type of diabetes you have.  How well-controlled your diabetes is.  Medicines you are taking.  If you have type 1 diabetes:  Check your blood glucose at least 2 times a day.  Also check your blood glucose: ? Before every insulin injection. ? Before and after exercise. ? Between meals. ? 2 hours after a meal. ? Occasionally between 2:00 a.m. and 3:00 a.m., as directed. ? Before potentially dangerous tasks, like driving or using heavy machinery. ? At bedtime.  You may need to check your blood glucose more often, up to 6-10 times a day: ? If you use an insulin pump. ? If you need multiple daily injections (MDI). ? If your diabetes is not well-controlled. ? If you are ill. ? If you have a history of severe hypoglycemia. ? If you have a history of not knowing when your blood glucose is getting low (hypoglycemia unawareness).  If you have type 2 diabetes:  If you take insulin or other diabetes medicines, check your blood glucose at least 2 times a day.  If you are on intensive insulin therapy, check your blood glucose at least 4 times a day. Occasionally, you may also need to check between 2:00 a.m. and 3:00 a.m., as directed.  Also check your blood glucose: ? Before and after exercise. ? Before potentially dangerous tasks, like driving or using heavy machinery.  You may need to check your blood glucose more often if: ? Your medicine is being adjusted. ? Your diabetes is not well-controlled. ? You are ill.  What is a blood glucose log?  A blood glucose log is a record of your blood glucose readings. It helps you and your health care provider: ? Look for patterns in your blood glucose over time. ? Adjust your diabetes management plan as needed.  Every time you check your blood glucose, write down your result  and notes about things that may be affecting your blood glucose, such as your diet and exercise for the day.  Most glucose  meters store a record of glucose readings in the meter. Some meters allow you to download your records to a computer. How do I check my blood glucose? Follow these steps to get accurate readings of your blood glucose: Supplies needed   Blood glucose meter.  Test strips for your meter. Each meter has its own strips. You must use the strips that come with your meter.  A needle to prick your finger (lancet). Do not use lancets more than once.  A device that holds the lancet (lancing device).  A journal or log book to write down your results.  Procedure  Wash your hands with soap and water.  Prick the side of your finger (not the tip) with the lancet. Use a different finger each time.  Gently rub the finger until a small drop of blood appears.  Follow instructions that come with your meter for inserting the test strip, applying blood to the strip, and using your blood glucose meter.  Write down your result and any notes.  Alternative testing sites  Some meters allow you to use areas of your body other than your finger (alternative sites) to test your blood.  If you think you may have hypoglycemia, or if you have hypoglycemia unawareness, do not use alternative sites. Use your finger instead.  Alternative sites may not be as accurate as the fingers, because blood flow is slower in these areas. This means that the result you get may be delayed, and it may be different from the result that you would get from your finger.  The most common alternative sites are: ? Forearm. ? Thigh. ? Palm of the hand.  Additional tips  Always keep your supplies with you.  If you have questions or need help, all blood glucose meters have a 24-hour "hotline" number that you can call. You may also contact your health care provider.  After you use a few boxes of test strips, adjust (calibrate) your blood glucose meter by following instructions that came with your meter.    The American Diabetes  Association suggests the following targets for most nonpregnant adults with diabetes.  More or less stringent glycemic goals may be appropriate for each individual.  A1C: Less than 7% A1C may also be reported as eAG: Less than 154 mg/dl Before a meal (preprandial plasma glucose): 80-130 mg/dl 1-2 hours after beginning of the meal (Postprandial plasma glucose)*: Less than 180 mg/dl  *Postprandial glucose may be targeted if A1C goals are not met despite reaching preprandial glucose goals.   GOALS in short:  The goals are for the Hgb A1C to be less than 7.0 & blood pressure to be less than 130/80.    It is recommended that all diabetics are educated on and follow a healthy diabetic diet, exercise for 30 minutes 3-4 times per week (walking, biking, swimming, or machine), monitor blood glucose readings and bring that record with you to be reviewed at your next office visit.     You should be checking fasting blood sugars- especially after you eat poorly or eat really healthy, and also check 2 hour postprandial blood sugars after largest meal of the day.    Write these down and bring in your log at each office visit.    You will need to be seen every 3 months by the provider managing your Diabetes unless  told otherwise by that provider.   You will need yearly eye exams from an eye specialist and foot exams to check the nerves of your feet.  Also, your urine should be checked yearly as well to make sure excess protein is not present.   If you are checking your blood pressure at home, please record it and bring it to your next office visit.    Follow the Dietary Approaches to Stop Hypertension (DASH) diet (3 servings of fruit and vegetables daily, whole grains, low sodium, low-fat proteins).  See below.    Lastly, when it comes to your cholesterol, the goal is to have the HDL (good cholesterol) >40, and the LDL (bad cholesterol) <100.   It is recommended that you follow a heart healthy, low saturated  and trans-fat diet and exercise for 30 minutes at least 5 times a week.     (( Check out the DASH diet = 1.5 Gram Low Sodium Diet   A 1.5 gram sodium diet restricts the amount of sodium in the diet to no more than 1.5 g or 1500 mg daily.  The American Heart Association recommends Americans over the age of 47 to consume no more than 1500 mg of sodium each day to reduce the risk of developing high blood pressure.  Research also shows that limiting sodium may reduce heart attack and stroke risk.  Many foods contain sodium for flavor and sometimes as a preservative.  When the amount of sodium in a diet needs to be low, it is important to know what to look for when choosing foods and drinks.  The following includes some information and guidelines to help make it easier for you to adapt to a low sodium diet.    QUICK TIPS  Do not add salt to food.  Avoid convenience items and fast food.  Choose unsalted snack foods.  Buy lower sodium products, often labeled as "lower sodium" or "no salt added."  Check food labels to learn how much sodium is in 1 serving.  When eating at a restaurant, ask that your food be prepared with less salt or none, if possible.    READING FOOD LABELS FOR SODIUM INFORMATION  The nutrition facts label is a good place to find how much sodium is in foods. Look for products with no more than 400 mg of sodium per serving.  Remember that 1.5 g = 1500 mg.  The food label may also list foods as:  Sodium-free: Less than 5 mg in a serving.  Very low sodium: 35 mg or less in a serving.  Low-sodium: 140 mg or less in a serving.  Light in sodium: 50% less sodium in a serving. For example, if a food that usually has 300 mg of sodium is changed to become light in sodium, it will have 150 mg of sodium.  Reduced sodium: 25% less sodium in a serving. For example, if a food that usually has 400 mg of sodium is changed to reduced sodium, it will have 300 mg of sodium.    CHOOSING FOODS   Grains  Avoid: Salted crackers and snack items. Some cereals, including instant hot cereals. Bread stuffing and biscuit mixes. Seasoned rice or pasta mixes.  Choose: Unsalted snack items. Low-sodium cereals, oats, puffed wheat and rice, shredded wheat. English muffins and bread. Pasta.  Meats  Avoid: Salted, canned, smoked, spiced, pickled meats, including fish and poultry. Bacon, ham, sausage, cold cuts, hot dogs, anchovies.  Choose: Low-sodium canned tuna and salmon.  Fresh or frozen meat, poultry, and fish.  Dairy  Avoid: Processed cheese and spreads. Cottage cheese. Buttermilk and condensed milk. Regular cheese.  Choose: Milk. Low-sodium cottage cheese. Yogurt. Sour cream. Low-sodium cheese.  Fruits and Vegetables  Avoid: Regular canned vegetables. Regular canned tomato sauce and paste. Frozen vegetables in sauces. Olives. Angie Fava. Relishes. Sauerkraut.  Choose: Low-sodium canned vegetables. Low-sodium tomato sauce and paste. Frozen or fresh vegetables. Fresh and frozen fruit.  Condiments  Avoid: Canned and packaged gravies. Worcestershire sauce. Tartar sauce. Barbecue sauce. Soy sauce. Steak sauce. Ketchup. Onion, garlic, and table salt. Meat flavorings and tenderizers.  Choose: Fresh and dried herbs and spices. Low-sodium varieties of mustard and ketchup. Lemon juice. Tabasco sauce. Horseradish.    SAMPLE 1.5 GRAM SODIUM MEAL PLAN:   Breakfast / Sodium (mg)  1 cup low-fat milk / 143 mg  1 whole-wheat English muffin / 240 mg  1 tbs heart-healthy margarine / 153 mg  1 hard-boiled egg / 139 mg  1 small orange / 0 mg  Lunch / Sodium (mg)  1 cup raw carrots / 76 mg  2 tbs no salt added peanut butter / 5 mg  2 slices whole-wheat bread / 270 mg  1 tbs jelly / 6 mg   cup red grapes / 2 mg  Dinner / Sodium (mg)  1 cup whole-wheat pasta / 2 mg  1 cup low-sodium tomato sauce / 73 mg  3 oz lean ground beef / 57 mg  1 small side salad (1 cup raw spinach leaves,  cup cucumber,  cup  yellow bell pepper) with 1 tsp olive oil and 1 tsp red wine vinegar / 25 mg  Snack / Sodium (mg)  1 container low-fat vanilla yogurt / 107 mg  3 graham cracker squares / 127 mg  Nutrient Analysis  Calories: 1745  Protein: 75 g  Carbohydrate: 237 g  Fat: 57 g  Sodium: 1425 mg  Document Released: 07/13/2005 Document Revised: 03/25/2011 Document Reviewed: 10/14/2009  ExitCare Patient Information 2012 [place].))    This information is not intended to replace advice given to you by your health care provider. Make sure you discuss any questions you have with your health care provider. Document Released: 07/16/2003 Document Revised: 01/31/2016 Document Reviewed: 12/23/2015 Elsevier Interactive Patient Education  2017 [employer].

## 2018-05-02 NOTE — Progress Notes (Signed)
Impression and Recommendations:    1. 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 Hypertension associated with diabetes ([place])   3. Hyperlipidemia associated with type 2 diabetes mellitus ([place])   4. Diabetes mellitus due to underlying condition with stage 3 chronic kidney disease, without long-term current use of insulin ([place])   5. Hypothyroidism, postsurgical   6. Stress and adjustment reaction   7. Noncompliance with diet and medication regimen   8. Need for Tdap vaccination   9. History of vitamin D deficiency   10. B12 deficiency due to diet     DM -Diabetes managed by Dr. Loanne Drilling; will defer for medication changes -Discussed goal levels for blood sugars with pts current age -Discussed foods that may be impacting blood sugar including juices, breads, and sugary food -Discussed negative impact of blood sugar on kidney and heart function due to comorbid diseases -Discussed ways to improve medical adherence including  -Encouraged pt to discuss with her specialist if her sugars are regularly above 200  HLD -Not at goal; managed by cardiology -Discussed importance of managing cholesterol for overall health -reviewed current cholesterol medications with pt  -Reviewed recent lipid panel with pt  HTN -At goal; pt unsymptomatic and stable -Discussed importance of maintaining healthy blood pressure ranges -Discussed impact of bp on the heart health especially with history of MI -Discussed the importance of regularly seeing specialists to maintain cardiovascular an doverall health -encouraged pt to continue seeing her cardiologist as directed  CKD -Pt to conduct urine test in [place] -Pt remains on an ARB; urine microalbumin stable at 30-100 from prior -Discussed goal ranges for GFR and Ser/Creatinine -Discussed need for controlling diabetes in order to slow decreased function of disease -Discussed previous  diagnosis of CKD and markers in lab testing to be aware of  Hypothyroidism -Thyroid levels normal as of 10/2017 -Pt stable  Exercise -Discussed positive impact of exercise on controlling HLD, HTN, DM and CKD  -Discussed ways to increase exercise like walking up and down her road each morning   Education and routine counseling performed. Handouts provided.   Medications Discontinued During This Encounter  Medication Reason  . losartan (COZAAR) 25 MG tablet Reorder      Meds ordered this encounter  Medications  . Tdap (BOOSTRIX) 5-2.5-18.5 LF-MCG/0.5 injection    Sig: Inject 0.5 mLs into the muscle once for 1 dose.    Dispense:  0.5 mL    Refill:  0  . losartan (COZAAR) 25 MG tablet    Sig: Take 1 tablet (25 mg total) by mouth every morning.    Dispense:  90 tablet    Refill:  3    The patient was counseled, risk factors were discussed, anticipatory guidance given.  Gross side effects, risk and benefits, and alternatives of medications discussed with patient.  Patient is aware that all medications have potential side effects and we are unable to predict every side effect or drug-drug interaction that may occur.  Expresses verbal understanding and consents to current therapy plan and treatment regimen.   Return for Every 6 months for BP, hypothyroidism, mood etc. per patient request.   Please see AVS handed out to patient at the end of our visit for further patient instructions/ counseling done pertaining to today's office visit.    Note:  This document was prepared using Dragon voice recognition software and may include unintentional dictation errors.  This document serves as  a record of services personally performed by Mellody Dance, MD. It was created on her behalf by Georga Bora, a trained medical scribe. The creation of this record is based on the scribe's personal observations and the provider's statements to them.   I have reviewed the above medical documentation  for accuracy and completeness and I concur.  Mellody Dance 05/04/18 1:14 PM      Subjective:    Chief Complaint  Patient presents with  . Follow-up     Alicia Medina is a 84 y.o. female who presents to [place] at [facility] today for Diabetes Management.     DM HPI: - She has not been working on diet and exercise for diabetes -Sees Dr. Loanne Drilling for management of her DM medications -Pt is currently maintained on levemir, which was changed last week -Feels her sugars have been higher since changing her medications -States she has been trying to adhere to her medications but "since they were changed I sometimes forget if I took it in the morning or not" -States "The guy I see for it said my sugars were fine so I'm not working on it"  -Home glucose readings range is "in the 200s for the last couple weeks" but states later that her sugars haven't been in the 200s since a few months ago -Says "it's been a little of everything" concerning taking her insulin on time, eating right and being stressed    Denies polyuria/polydipsia. Denies hypo/ hyperglycemia symptoms - She denies new onset of: chest pain, exercise intolerance, shortness of breath, dizziness, visual changes, headache, lower extremity swelling or claudication.   Last diabetic eye exam was No results found for: HMDIABEYEEXA  Foot exam- UTD  Last A1C in the office was:  Lab Results  Component Value Date   HGBA1C 8.2 (A) 04/20/2018   HGBA1C 8.2 (A) 02/08/2018   HGBA1C 8.7 (H) 11/22/2017    Lab Results  Component Value Date   MICROALBUR 80 11/29/2017   [place] 85 01/24/2018   CREATININE 1.04 (H) 01/24/2018    HTN HPI: -  Her blood pressure has been controlled at home, usually in the 130's over 80's -Pt is sometimes checking it at home.   - Patient reports good compliance with blood pressure medications  - Denies medication S-E   - Smoking Status noted   - She denies new onset of:  chest pain, exercise intolerance, shortness of breath, dizziness, visual changes, headache, lower extremity swelling or claudication.   Last 3 blood pressure readings in our office are as follows: BP Readings from Last 3 Encounters:  05/02/18 128/73  04/20/18 (!) 142/100  02/08/18 126/60    Filed Weights   05/02/18 1458  Weight: 175 lb (79.4 kg)      CHOL HPI:  -  She  is currently managed with rosuvastatin, which she states she is taking regularly  Patient reports very little compliance with low chol/ saturated and trans fat diet.  No exercise  RUQ pain- denies  Muscle aches-denies  No other s-e  Last lipid panel as follows:  Lab Results  Component Value Date   CHOL 156 01/24/2018   HDL 51 01/24/2018   LDLCALC 85 01/24/2018   TRIG 98 01/24/2018   CHOLHDL 3.1 01/24/2018    Hepatic Function Latest Ref Rng & Units 01/24/2018 06/21/2017 12/10/2016  Total Protein 6.0 - 8.5 g/dL 7.3 7.4 7.1  Albumin 3.5 - 4.7 g/dL 4.3 4.3 4.1  AST 0 - 40 IU/L  _0 ALT 0 - 32 IU/L _1 Alk Phosphatase 39 - 117 IU/L 43 48 38  Total Bilirubin 0.0 - 1.2 mg/dL 0.4 0.3 0.4  Bilirubin, Direct 0.00 - 0.40 mg/dL - 0.12 0.1   CKD -Urine Microalbumin was 30 in 08/2016 and 80 in 11/2017 -Pt states "I was never diagnosed with that, I don't know when that happened"  Hypothyroidism TSH was 2.240 in 10/2017 T4 was 1.70 in 10/2017 T3 was 2.8 in 10/2017  Mood -Pt denies issues with depression or stress at this time  Depression screen [facility] 2/9 05/02/2018 11/29/2017 09/13/2017 06/11/2017 03/11/2017  Decreased Interest 0 0 0 0 0  Down, Depressed, Hopeless 0 0 0 0 0  PHQ - 2 Score 0 0 0 0 0  Altered sleeping 0 0 0 0 -  Tired, decreased energy 0 0 0 0 -  Change in appetite 0 0 0 0 -  Feeling bad or failure about yourself  0 0 0 0 -  Trouble concentrating 0 0 0 0 -  Moving slowly or fidgety/restless 0 0 0 0 -  Suicidal thoughts 0 0 0 0 -  PHQ-9 Score 0 0 0 0 -  Difficult doing work/chores Not  difficult at all Not difficult at all Not difficult at all Not difficult at all -     Exercise -Pt states she has not been walking "bc the cars zoom by too fast"    Last 3 blood pressure readings in our office are as follows: BP Readings from Last 3 Encounters:  05/02/18 128/73  04/20/18 (!) 142/100  02/08/18 126/60    BMI Readings from Last 3 Encounters:  05/02/18 29.12 kg/m  04/20/18 28.62 kg/m  02/08/18 28.79 kg/m     No problems updated.    Patient Care Team    Relationship Specialty Notifications Start End  Mellody Dance, DO PCP - General Family Medicine  06/12/16   Martinique, Peter M, MD Consulting Physician Cardiology  07/22/16   Maisie Fus, MD Consulting Physician Obstetrics and Gynecology  34/19/37   Leighton Ruff, MD Consulting Physician General Surgery  07/22/16   Armandina Gemma, MD Consulting Physician General Surgery  07/22/16   Daryll Brod, [place] Physician Orthopedic Surgery  07/22/16      Patient Active Problem List   Diagnosis Date Noted  . Diabetes mellitus due to underlying condition with stage 3 chronic kidney disease, without long-term current use of insulin ([place]) 11/29/2017    Priority: High  . Diabetes mellitus due to underlying condition with microalbuminuria, with long-term current use of insulin ([place]) 03/11/2017    Priority: High  . Hypertension associated with diabetes ([place]) 09/27/2014    Priority: High  . Hyperlipidemia associated with type 2 diabetes mellitus ([place]) 11/12/2011    Priority: High  . Old inferior wall myocardial infarction 08/29/2011    Priority: High  . Hemorrhoids, internal, with bleeding 05/08/2013    Priority: Medium  . Hx of thyroidectomy 09/13/2017  . Noncompliance with diet and medication regimen 09/13/2017  . Stress and adjustment reaction 06/11/2017  . Strain of latissimus dorsi muscle 06/11/2017  . History of vitamin D deficiency 03/11/2017  . Other fatigue 03/11/2017  . Abscess 09/03/2016  .  Hypothyroidism, postsurgical 07/22/2016  . Coronary artery disease 11/12/2011     Past Medical History:  Diagnosis Date  . Arthritis   . CAD (coronary artery disease) CARDIOLOGIST-  DR Martinique   08-28-2011 Inferior STEMI with BMS to the RCA,  complicated by cardiogenic shock, VF, VDRF , temporary CHB and abrupt reocclusion of the RCA stent treated with a second BMS to the RCA. Has residual LAD disease. EF is 50%  . Diverticulosis of colon   . Hemorrhoids, internal, with bleeding   . Hyperlipidemia   . Hypertension   . Hypothyroidism, postsurgical   . S/p bare metal coronary artery stent    2013  . Type 2 diabetes mellitus ([place])   . Wears hearing aid    both ears     Past Surgical History:  Procedure Laterality Date  . CARDIOVASCULAR STRESS TEST  10-03-2011  dr Martinique   normal perfusion / no ischemia/ ef 77%  . CARPAL TUNNEL RELEASE Right 01/25/2013   Procedure: CARPAL TUNNEL RELEASE;  Surgeon: Wynonia Sours, MD;  Location: [place];  Service: Orthopedics;  Laterality: Right;  . CARPAL TUNNEL RELEASE Left 10/25/2013   Procedure: LEFT CARPAL TUNNEL RELEASE;  Surgeon: Wynonia Sours, MD;  Location: [place];  Service: Orthopedics;  Laterality: Left;  . CATARACT EXTRACTION W/ INTRAOCULAR LENS  IMPLANT, BILATERAL  2007  . COLONOSCOPY  04-27-2003  . CORONARY ANGIOGRAM Left 08/28/2011   Procedure: CORONARY ANGIOGRAM;  Surgeon: Peter M Martinique, MD;  Location: [facility] CATH LAB;  Service: Cardiovascular;  Laterality: Left;  . CORONARY ANGIOPLASTY WITH STENT PLACEMENT  08-28-2011  dr Martinique   BMS to [facility] with abrupt reocclusion treated with second BMS/  LAD ostial 90%, proximal50-60%, first diagonal 80-90%  . LEFT HEART CATHETERIZATION WITH CORONARY ANGIOGRAM N/A 08/28/2011   Procedure: LEFT HEART CATHETERIZATION WITH CORONARY ANGIOGRAM;  Surgeon: Peter M Martinique, MD;  Location: [facility] CATH LAB;  Service: Cardiovascular;  Laterality: N/A;  . PERCUTANEOUS CORONARY STENT  INTERVENTION (PCI-S) Left 08/28/2011   Procedure: PERCUTANEOUS CORONARY STENT INTERVENTION (PCI-S);  Surgeon: Peter M Martinique, MD;  Location: [facility] CATH LAB;  Service: Cardiovascular;  Laterality: Left;  . TOTAL THYROIDECTOMY Bilateral 05-02-2007   multinodular goiter  . TRANSANAL HEMORRHOIDAL DEARTERIALIZATION N/A 03/28/2014   Procedure: TRANSANAL HEMORRHOIDAL DEARTERIALIZATION;  Surgeon: Leighton Ruff, MD;  Location: [facility];  Service: General;  Laterality: N/A;  . TRANSTHORACIC ECHOCARDIOGRAM  08-30-2011   mild LVH/ ef 00-92%/  grade I diastolic dysfunction  . TRIGGER FINGER RELEASE Right 01/25/2013   Procedure: RELEASE TRIGGER FINGER/A-1 PULLEY RIGHT INDEX FINGER;  Surgeon: Wynonia Sours, MD;  Location: [place];  Service: Orthopedics;  Laterality: Right;  . TRIGGER FINGER RELEASE Left 10/25/2013   Procedure: RELEASE A-1 PULLEY LEFT MIDDLE FINGER;  Surgeon: Wynonia Sours, MD;  Location: [place];  Service: Orthopedics;  Laterality: Left;  . TRIGGER FINGER RELEASE Left 12/04/2014   Procedure: RELEASE TRIGGER FINGER/A-1 PULLEY LEFT RING FINGER;  Surgeon: Daryll Brod, MD;  Location: [place];  Service: Orthopedics;  Laterality: Left;  . TRIGGER FINGER RELEASE Right 03/30/2017   Procedure: RELEASE TRIGGER FINGER/A-1 PULLEY RIGHT RING FINGER AND RIGHT SMALL FINGER;  Surgeon: Daryll Brod, MD;  Location: [place];  Service: Orthopedics;  Laterality: Right;     Family History  Problem Relation Age of Onset  . Stroke Mother   . Cancer Mother        breast  . Cancer Father        lung  . Stroke Father   . Diabetes Neg Hx      Social History   Substance and Sexual Activity  Drug Use No  ,  Social History  Substance and Sexual Activity  Alcohol Use Yes  . Alcohol/week: 1.0 standard drinks  . Types: 1 Glasses of wine per week   Comment: social  ,  Social History   Tobacco Use  Smoking Status Never Smoker    Smokeless Tobacco Never Used  ,    Current Outpatient Medications on File Prior to Visit  Medication Sig Dispense Refill  . aspirin 81 MG tablet Take 81 mg by mouth daily.    . Blood Glucose Monitoring Suppl (TRUE METRIX METER) w/Device KIT by Does not apply route as directed.    . calcium carbonate (CALCIUM 600) 1500 (600 Ca) MG TABS tablet Take 600 mg of elemental calcium by mouth daily with breakfast.    . Coenzyme Q10 200 MG TABS Take 1 tablet by mouth daily.    . Cyanocobalamin (B-12) 2500 MCG TABS Take 1 tablet by mouth daily.    . Insulin Detemir (LEVEMIR FLEXTOUCH) 100 UNIT/ML Pen Inject 35 Units into the skin every morning. And pen needles 1/day 15 mL 11  . loperamide (IMODIUM) 2 MG capsule Take by mouth as needed for diarrhea or loose stools.    . metoprolol tartrate (LOPRESSOR) 50 MG tablet TAKE 1 TABLET BY MOUTH 2 TIMES DAILY. 180 tablet 2  . Multiple Vitamin (MULTIVITAMIN) tablet Take 1 tablet by mouth daily.    . potassium chloride SA (K-DUR,KLOR-CON) 20 MEQ tablet TAKE 1 TABLET BY MOUTH DAILY. 90 tablet 3  . rosuvastatin (CRESTOR) 20 MG tablet TAKE 1 TABLET BY MOUTH DAILY. 90 tablet 2  . S-Adenosylmethionine (SAM-E) 400 MG TABS Take 1 tablet by mouth daily.    Marland Kitchen SYNTHROID 150 MCG tablet TAKE 1 TABLET (150 MCG TOTAL) BY MOUTH DAILY BEFORE BREAKFAST. 90 tablet 1  . vitamin C (ASCORBIC ACID) 500 MG tablet Take 500 mg by mouth daily.    Marland Kitchen VITAMIN E PO Take 500 mg by mouth daily.     No current facility-administered medications on file prior to visit.      Allergies  Allergen Reactions  . Demeclocycline Other (See Comments)    Severe stomach cramps Severe stomach cramps Severe stomach cramps  . Lisinopril Cough  . Tetracyclines & Related Other (See Comments)    Severe stomach cramps     Review of Systems:   General:  Denies fever, chills Optho/Auditory:   Denies visual changes, blurred vision Respiratory:   Denies SOB, cough, wheeze, DIB  Cardiovascular:   Denies  chest pain, palpitations, painful respirations Gastrointestinal:   Denies nausea, vomiting, diarrhea.  Endocrine:     Denies new hot or cold intolerance Musculoskeletal:  Denies joint swelling, gait issues, or new unexplained myalgias/ arthralgias Skin:  Denies rash, suspicious lesions  Neurological:    Denies dizziness, unexplained weakness, numbness  Psychiatric/Behavioral:   Denies mood changes    Objective:     Blood pressure 128/73, pulse 78, height _0  (1.651 m), weight 175 lb (79.4 kg), SpO2 98 %.  Body mass index is 29.12 kg/m.  General: Well Developed, well nourished, and in no acute distress.  HEENT: Normocephalic, atraumatic, pupils equal round reactive to light, neck supple, No carotid bruits, no JVD Skin: Warm and dry, cap RF less 2 sec Cardiac: Regular rate and rhythm, S1, S2 WNL's, no murmurs rubs or gallops Respiratory: ECTA B/L, Not using accessory muscles, speaking in full sentences. NeuroM-Sk: Ambulates w/o assistance, moves ext * 4 w/o difficulty, sensation grossly intact.  Ext: scant edema b/l lower ext Psych: No HI/SI, judgement and  insight good, Euthymic mood. Full Affect.

## 2018-05-10 DIAGNOSIS — N390 Urinary tract infection, site not specified: Secondary | ICD-10-CM | POA: Diagnosis not present

## 2018-05-10 DIAGNOSIS — Z6830 Body mass index (BMI) 30.0-30.9, adult: Secondary | ICD-10-CM | POA: Diagnosis not present

## 2018-05-10 DIAGNOSIS — Z124 Encounter for screening for malignant neoplasm of cervix: Secondary | ICD-10-CM | POA: Diagnosis not present

## 2018-05-24 DIAGNOSIS — N8189 Other female genital prolapse: Secondary | ICD-10-CM | POA: Diagnosis not present

## 2018-05-24 DIAGNOSIS — Z23 Encounter for immunization: Secondary | ICD-10-CM | POA: Diagnosis not present

## 2018-05-24 DIAGNOSIS — N390 Urinary tract infection, site not specified: Secondary | ICD-10-CM | POA: Diagnosis not present

## 2018-06-20 ENCOUNTER — Encounter: Payer: Self-pay | Admitting: Endocrinology

## 2018-06-20 ENCOUNTER — Ambulatory Visit (INDEPENDENT_AMBULATORY_CARE_PROVIDER_SITE_OTHER): Payer: Medicare Other | Admitting: Endocrinology

## 2018-06-20 VITALS — BP 134/70 | HR 86 | Ht 65.0 in | Wt 170.4 lb

## 2018-06-20 DIAGNOSIS — E1122 Type 2 diabetes mellitus with diabetic chronic kidney disease: Secondary | ICD-10-CM | POA: Diagnosis not present

## 2018-06-20 DIAGNOSIS — I2510 Atherosclerotic heart disease of native coronary artery without angina pectoris: Secondary | ICD-10-CM | POA: Diagnosis not present

## 2018-06-20 DIAGNOSIS — N183 Chronic kidney disease, stage 3 unspecified: Secondary | ICD-10-CM

## 2018-06-20 DIAGNOSIS — Z794 Long term (current) use of insulin: Secondary | ICD-10-CM

## 2018-06-20 LAB — POCT GLYCOSYLATED HEMOGLOBIN (HGB A1C): Hemoglobin A1C: 8.7 % — AB (ref 4.0–5.6)

## 2018-06-20 MED ORDER — INSULIN DETEMIR 100 UNIT/ML FLEXPEN: 40.0000 [IU] | PEN_INJECTOR | SUBCUTANEOUS | 11 refills | Status: DC

## 2018-06-20 MED ORDER — GLUCOSE BLOOD VI STRP: 1.0000 | ORAL_STRIP | Freq: Two times a day (BID) | 3 refills | Status: AC

## 2018-06-20 NOTE — Patient Instructions (Addendum)
check your blood sugar twice a day.  vary the time of day when you check, between before the 3 meals, and at bedtime.  also check if you have symptoms of your blood sugar being too high or too low.  please keep a record of the readings and bring it to your next appointment here (or you can bring the meter itself).  You can write it on any piece of paper.  please call us sooner if your blood sugar goes below 70, or if you have a lot of readings over 200. Please increase the levemir to 40 each morning.   On this type of insulin schedule, you should eat meals on a regular schedule.  If a meal is missed or significantly delayed, your blood sugar could go low.   Please come back for a follow-up appointment in 2 months.

## 2018-06-20 NOTE — Progress Notes (Signed)
Subjective:    Patient ID: Alicia Medina, female    DOB: May 27, 1933, 84 y.o.   MRN: [id number]  HPI Pt returns for f/u of diabetes mellitus: DM type: Insulin-requiring type 2 Dx'ed: 0865 Complications: CAD and renal insuff Therapy: insulin since 2013 GDM: never DKA: never Severe hypoglycemia: never Pancreatitis: never Pancreatic imaging: never Other: she declines multiple daily injections; She did not tolerate metformin (diarrhea); she changed lantus to levemir, based on the pattern of cbg's Interval history: she brings a record of her cbg's which I have reviewed today.  She checks fasting only.  It varies from 118-250.  It is in general slightly higher as the day goes on.  pt states she feels well in general.   Past Medical History:  Diagnosis Date  . Arthritis   . CAD (coronary artery disease) CARDIOLOGIST-  DR Martinique   08-28-2011 Inferior STEMI with BMS to the RCA, complicated by cardiogenic shock, VF, VDRF , temporary CHB and abrupt reocclusion of the RCA stent treated with a second BMS to the RCA. Has residual LAD disease. EF is 50%  . Diverticulosis of colon   . Hemorrhoids, internal, with bleeding   . Hyperlipidemia   . Hypertension   . Hypothyroidism, postsurgical   . S/p bare metal coronary artery stent    2013  . Type 2 diabetes mellitus ([place])   . Wears hearing aid    both ears    Past Surgical History:  Procedure Laterality Date  . CARDIOVASCULAR STRESS TEST  10-03-2011  dr Martinique   normal perfusion / no ischemia/ ef 77%  . CARPAL TUNNEL RELEASE Right 01/25/2013   Procedure: CARPAL TUNNEL RELEASE;  Surgeon: Wynonia Sours, MD;  Location: [place];  Service: Orthopedics;  Laterality: Right;  . CARPAL TUNNEL RELEASE Left 10/25/2013   Procedure: LEFT CARPAL TUNNEL RELEASE;  Surgeon: Wynonia Sours, MD;  Location: [place];  Service: Orthopedics;  Laterality: Left;  . CATARACT EXTRACTION W/ INTRAOCULAR LENS  IMPLANT, BILATERAL  2007  .  COLONOSCOPY  04-27-2003  . CORONARY ANGIOGRAM Left 08/28/2011   Procedure: CORONARY ANGIOGRAM;  Surgeon: Peter M Martinique, MD;  Location: [facility] CATH LAB;  Service: Cardiovascular;  Laterality: Left;  . CORONARY ANGIOPLASTY WITH STENT PLACEMENT  08-28-2011  dr Martinique   BMS to [facility] with abrupt reocclusion treated with second BMS/  LAD ostial 90%, proximal50-60%, first diagonal 80-90%  . LEFT HEART CATHETERIZATION WITH CORONARY ANGIOGRAM N/A 08/28/2011   Procedure: LEFT HEART CATHETERIZATION WITH CORONARY ANGIOGRAM;  Surgeon: Peter M Martinique, MD;  Location: [facility] CATH LAB;  Service: Cardiovascular;  Laterality: N/A;  . PERCUTANEOUS CORONARY STENT INTERVENTION (PCI-S) Left 08/28/2011   Procedure: PERCUTANEOUS CORONARY STENT INTERVENTION (PCI-S);  Surgeon: Peter M Martinique, MD;  Location: [facility] CATH LAB;  Service: Cardiovascular;  Laterality: Left;  . TOTAL THYROIDECTOMY Bilateral 05-02-2007   multinodular goiter  . TRANSANAL HEMORRHOIDAL DEARTERIALIZATION N/A 03/28/2014   Procedure: TRANSANAL HEMORRHOIDAL DEARTERIALIZATION;  Surgeon: Leighton Ruff, MD;  Location: [facility];  Service: General;  Laterality: N/A;  . TRANSTHORACIC ECHOCARDIOGRAM  08-30-2011   mild LVH/ ef 78-46%/  grade I diastolic dysfunction  . TRIGGER FINGER RELEASE Right 01/25/2013   Procedure: RELEASE TRIGGER FINGER/A-1 PULLEY RIGHT INDEX FINGER;  Surgeon: Wynonia Sours, MD;  Location: [place];  Service: Orthopedics;  Laterality: Right;  . TRIGGER FINGER RELEASE Left 10/25/2013   Procedure: RELEASE A-1 PULLEY LEFT MIDDLE FINGER;  Surgeon: Wynonia Sours, MD;  Location: [place];  Service: Orthopedics;  Laterality: Left;  . TRIGGER FINGER RELEASE Left 12/04/2014   Procedure: RELEASE TRIGGER FINGER/A-1 PULLEY LEFT RING FINGER;  Surgeon: Daryll Brod, MD;  Location: [place];  Service: Orthopedics;  Laterality: Left;  . TRIGGER FINGER RELEASE Right 03/30/2017   Procedure: RELEASE TRIGGER FINGER/A-1  PULLEY RIGHT RING FINGER AND RIGHT SMALL FINGER;  Surgeon: Daryll Brod, MD;  Location: [place];  Service: Orthopedics;  Laterality: Right;    Social History   Socioeconomic History  . Marital status: Married    Spouse name: Not on file  . Number of children: Not on file  . Years of education: Not on file  . Highest education level: Not on file  Occupational History  . Not on file  Social Needs  . Financial resource strain: Not on file  . Food insecurity:    Worry: Not on file    Inability: Not on file  . Transportation needs:    Medical: Not on file    Non-medical: Not on file  Tobacco Use  . Smoking status: Never Smoker  . Smokeless tobacco: Never Used  Substance and Sexual Activity  . Alcohol use: Yes    Alcohol/week: 1.0 standard drinks    Types: 1 Glasses of wine per week    Comment: social  . Drug use: No  . Sexual activity: Not on file  Lifestyle  . Physical activity:    Days per week: Not on file    Minutes per session: Not on file  . Stress: Not on file  Relationships  . Social connections:    Talks on phone: Not on file    Gets together: Not on file    Attends religious service: Not on file    Active member of club or organization: Not on file    Attends meetings of clubs or organizations: Not on file    Relationship status: Not on file  . Intimate partner violence:    Fear of current or ex partner: Not on file    Emotionally abused: Not on file    Physically abused: Not on file    Forced sexual activity: Not on file  Other Topics Concern  . Not on file  Social History Narrative   Lives with husband.   Has 2 sons- both healthy.   Younger son lives in [place], [place] in order son lives in [place].    Current Outpatient Medications on File Prior to Visit  Medication Sig Dispense Refill  . aspirin 81 MG tablet Take 81 mg by mouth daily.    . calcium carbonate (CALCIUM 600) 1500 (600 Ca) MG TABS tablet Take 600 mg of elemental calcium  by mouth daily with breakfast.    . Coenzyme Q10 200 MG TABS Take 1 tablet by mouth daily.    . Cyanocobalamin (B-12) 2500 MCG TABS Take 1 tablet by mouth daily.    Marland Kitchen loperamide (IMODIUM) 2 MG capsule Take by mouth as needed for diarrhea or loose stools.    Marland Kitchen losartan (COZAAR) 25 MG tablet Take 1 tablet (25 mg total) by mouth every morning. 90 tablet 3  . metoprolol tartrate (LOPRESSOR) 50 MG tablet TAKE 1 TABLET BY MOUTH 2 TIMES DAILY. 180 tablet 2  . Multiple Vitamin (MULTIVITAMIN) tablet Take 1 tablet by mouth daily.    . potassium chloride SA (K-DUR,KLOR-CON) 20 MEQ tablet TAKE 1 TABLET BY MOUTH DAILY. 90 tablet 3  . rosuvastatin (CRESTOR) 20  MG tablet TAKE 1 TABLET BY MOUTH DAILY. 90 tablet 2  . S-Adenosylmethionine (SAM-E) 400 MG TABS Take 1 tablet by mouth daily.    Marland Kitchen SYNTHROID 150 MCG tablet TAKE 1 TABLET (150 MCG TOTAL) BY MOUTH DAILY BEFORE BREAKFAST. 90 tablet 1  . vitamin C (ASCORBIC ACID) 500 MG tablet Take 500 mg by mouth daily.    Marland Kitchen VITAMIN E PO Take 500 mg by mouth daily.     No current facility-administered medications on file prior to visit.     Allergies  Allergen Reactions  . Demeclocycline Other (See Comments)    Severe stomach cramps Severe stomach cramps Severe stomach cramps  . Lisinopril Cough  . Tetracyclines & Related Other (See Comments)    Severe stomach cramps    Family History  Problem Relation Age of Onset  . Stroke Mother   . Cancer Mother        breast  . Cancer Father        lung  . Stroke Father   . Diabetes Neg Hx     BP 134/70 (BP Location: Left Arm, Patient Position: Sitting, Cuff Size: Normal)   Pulse 86   Ht 5\' 5"  (1.651 m)   Wt 170 lb 6.4 oz (77.3 kg)   SpO2 94%   BMI 28.36 kg/m    Review of Systems She denies hypoglycemia.      Objective:   Physical Exam VITAL SIGNS:  See vs page GENERAL: no distress Pulses: dorsalis pedis intact bilat.   MSK: no deformity of the feet CV: 2+ bilat leg edema Skin:  no ulcer on the  feet.  normal color and temp on the feet. Neuro: sensation is intact to touch on the feet, but decreased from normal.   Ext: There is bilateral onychomycosis of the toenails  Lab Results  Component Value Date   CREATININE 1.04 (H) 01/24/2018   BUN 12 01/24/2018   NA 141 01/24/2018   K 5.0 01/24/2018   CL 103 01/24/2018   CO2 25 01/24/2018     A1c=8.7%    Assessment & Plan:  Insulin-requiring type 2 DM, with CAD: worse Renal insuff: she may need a faster-acting qd insulin, but we don't have cbg date later in the day to confirm this.  Therefore, we'll increase levemir for now.    Patient Instructions  check your blood sugar twice a day.  vary the time of day when you check, between before the 3 meals, and at bedtime.  also check if you have symptoms of your blood sugar being too high or too low.  please keep a record of the readings and bring it to your next appointment here (or you can bring the meter itself).  You can write it on any piece of paper.  please call us sooner if your blood sugar goes below 70, or if you have a lot of readings over 200. Please increase the levemir to 40 each morning.   On this type of insulin schedule, you should eat meals on a regular schedule.  If a meal is missed or significantly delayed, your blood sugar could go low.   Please come back for a follow-up appointment in 2 months.

## 2018-07-05 DIAGNOSIS — R319 Hematuria, unspecified: Secondary | ICD-10-CM | POA: Diagnosis not present

## 2018-07-05 DIAGNOSIS — N8189 Other female genital prolapse: Secondary | ICD-10-CM | POA: Diagnosis not present

## 2018-07-05 DIAGNOSIS — N390 Urinary tract infection, site not specified: Secondary | ICD-10-CM | POA: Diagnosis not present

## 2018-07-15 IMAGING — MG 2D DIGITAL SCREENING BILATERAL MAMMOGRAM WITH CAD AND ADJUNCT TO
9 of 12 series · 9 of 28 positions shown · non-contrast
Comparison: Previous exam(s).

CLINICAL DATA: Screening.

EXAM:
2D DIGITAL SCREENING BILATERAL MAMMOGRAM WITH CAD AND ADJUNCT TOMO

[R CC]
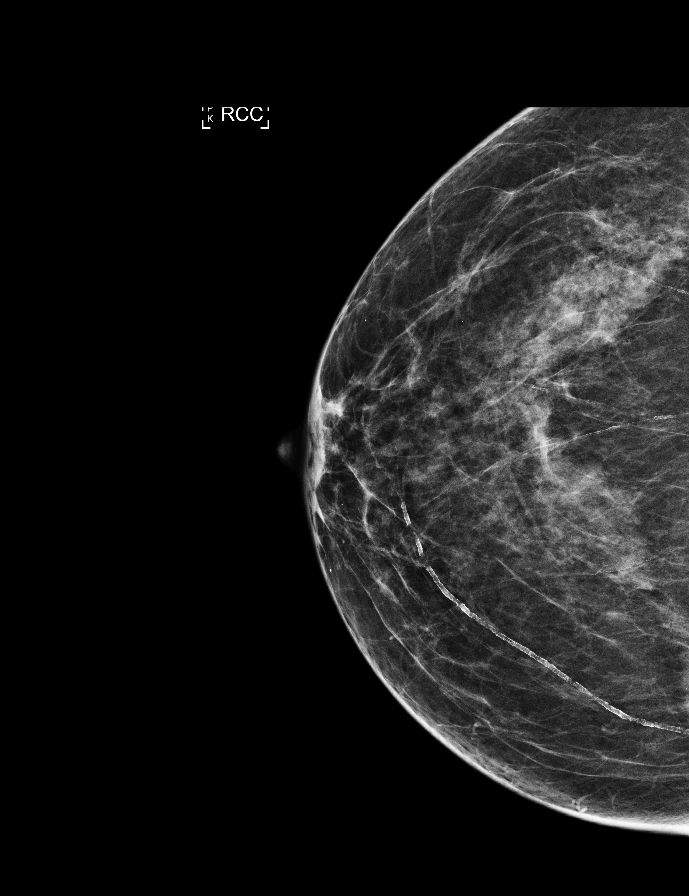

[R MLO]
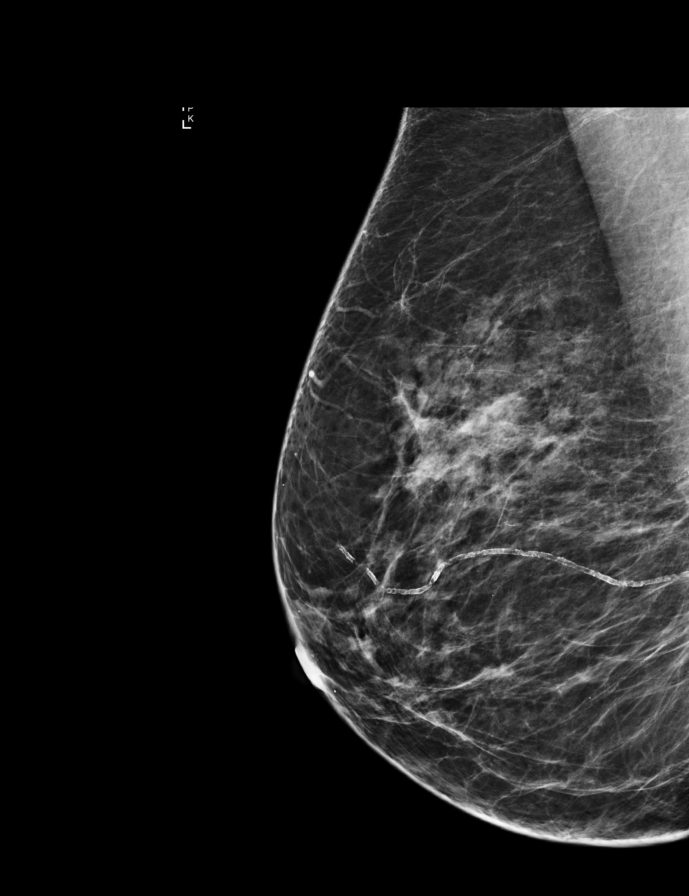

[L MLO synth-2D]
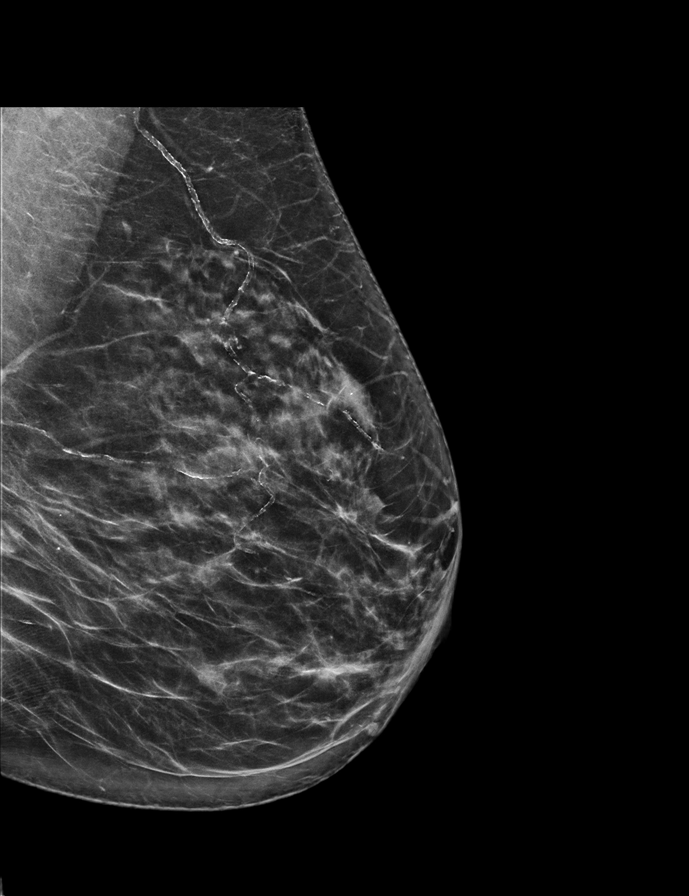

[R MLO synth-2D]
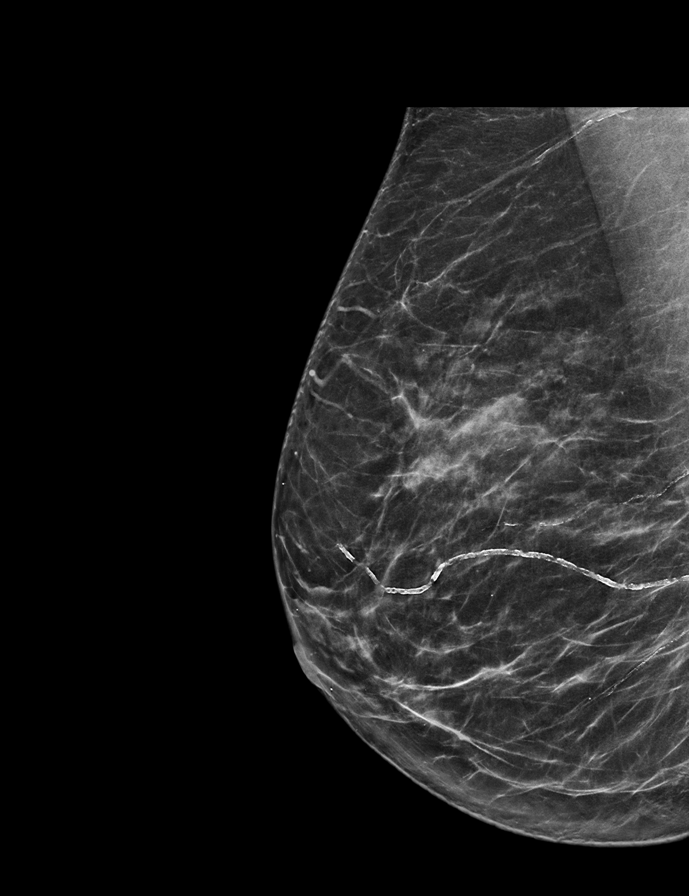

[L MLO]
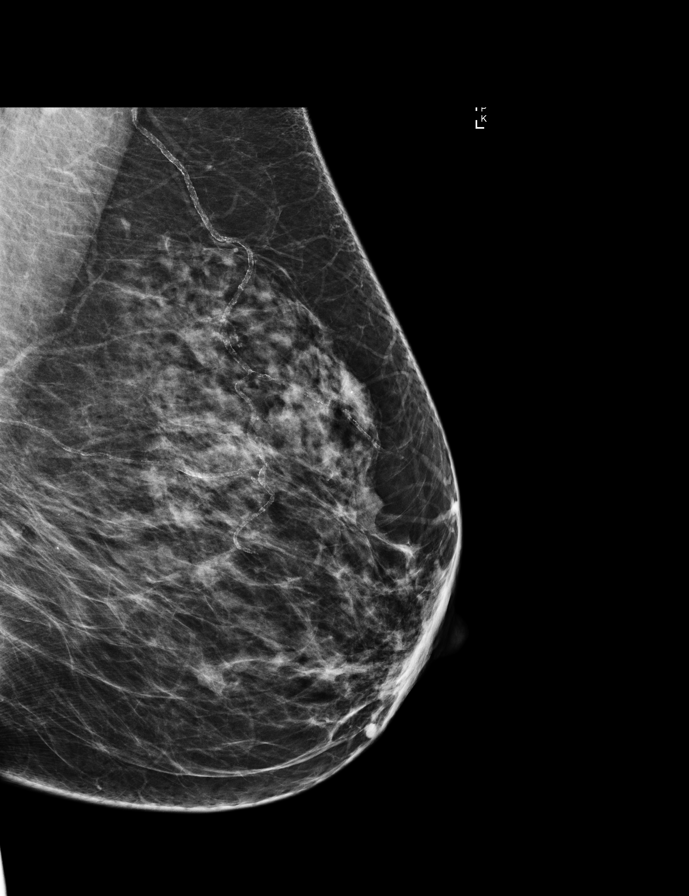

[R CC synth-2D]
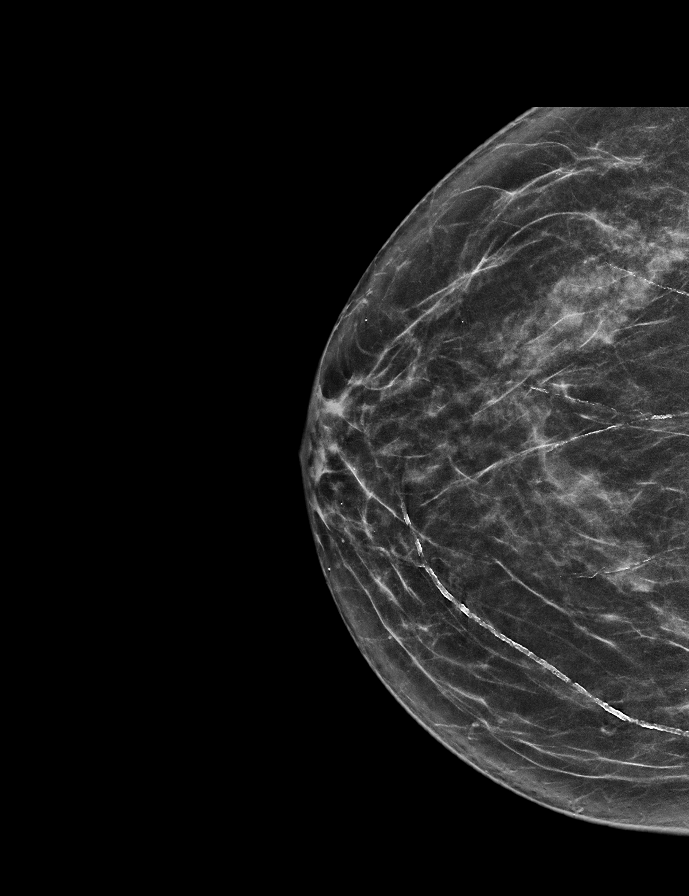

[L CC synth-2D]
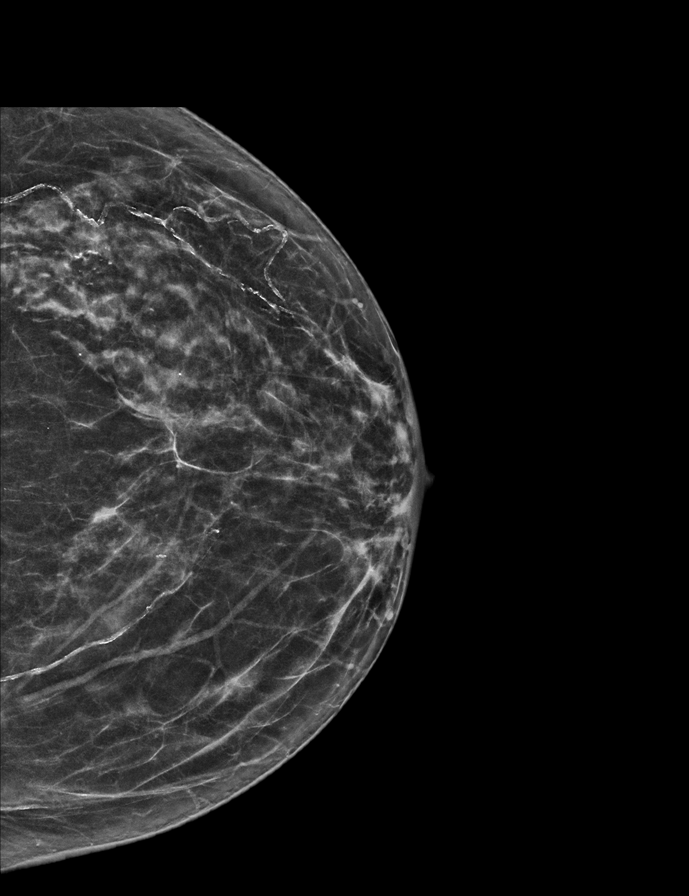

[L CC]
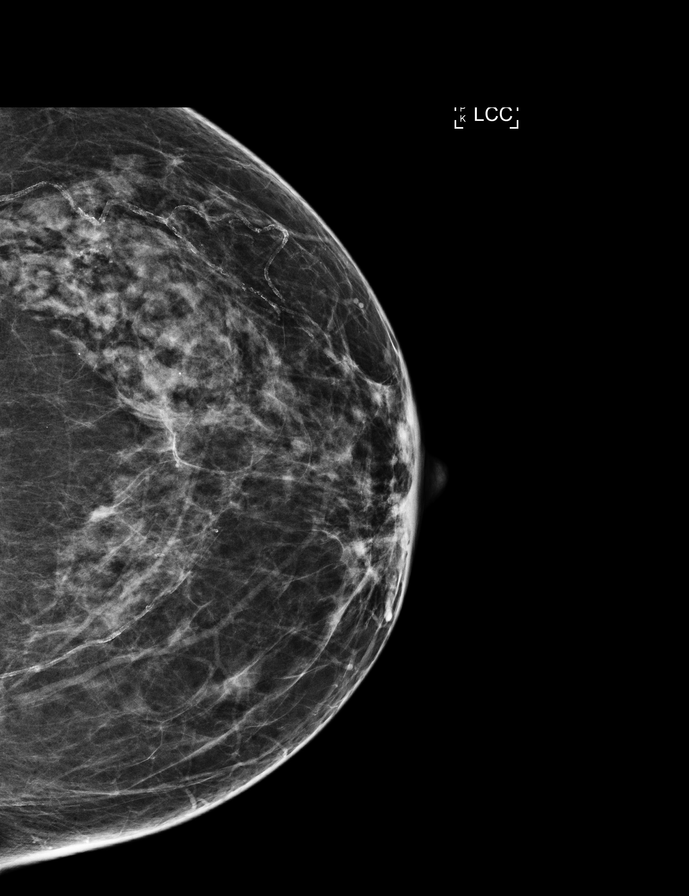

[R CC tomo · tomo slice 31/60.0]
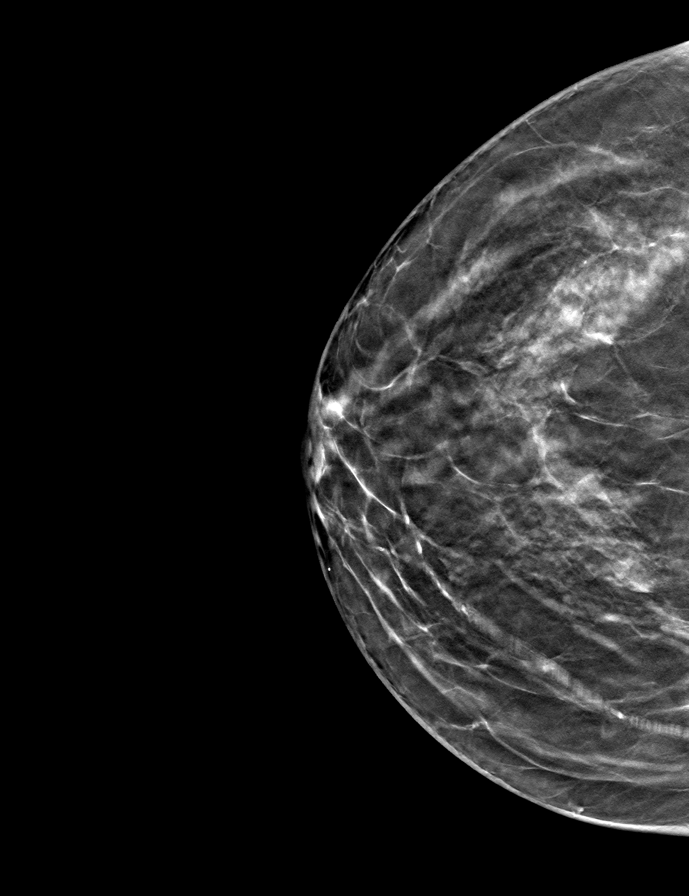

[9 of 28 positions shown; findings below may reference images not displayed]

ACR Breast Density Category c: The breast tissue is heterogeneously
dense, which may obscure small masses.
FINDINGS: There are no findings suspicious for malignancy. Images were
processed with CAD.
IMPRESSION: No mammographic evidence of malignancy. A result letter of this
screening mammogram will be mailed directly to the patient.

RECOMMENDATION:
Screening mammogram in one year. (Code:[id number])

BI-RADS CATEGORY  1: Negative.

## 2018-07-28 ENCOUNTER — Other Ambulatory Visit: Payer: Self-pay | Admitting: Family Medicine

## 2018-07-28 ENCOUNTER — Other Ambulatory Visit: Payer: Self-pay | Admitting: Cardiology

## 2018-08-16 ENCOUNTER — Ambulatory Visit (INDEPENDENT_AMBULATORY_CARE_PROVIDER_SITE_OTHER): Payer: Medicare Other | Admitting: Endocrinology

## 2018-08-16 ENCOUNTER — Ambulatory Visit (HOSPITAL_COMMUNITY)
Admission: EM | Admit: 2018-08-16 | Discharge: 2018-08-16 | Disposition: A | Discharge status: Home / Self Care | Payer: Medicare Other | Attending: Family Medicine | Admitting: Family Medicine

## 2018-08-16 ENCOUNTER — Encounter: Payer: Self-pay | Admitting: Endocrinology

## 2018-08-16 ENCOUNTER — Encounter (HOSPITAL_COMMUNITY): Payer: Self-pay | Admitting: Family Medicine

## 2018-08-16 VITALS — BP 140/70 | HR 78 | Ht 65.0 in | Wt 168.6 lb

## 2018-08-16 DIAGNOSIS — L02212 Cutaneous abscess of back [any part, except buttock]: Secondary | ICD-10-CM | POA: Diagnosis not present

## 2018-08-16 DIAGNOSIS — Z794 Long term (current) use of insulin: Secondary | ICD-10-CM

## 2018-08-16 DIAGNOSIS — L0291 Cutaneous abscess, unspecified: Secondary | ICD-10-CM | POA: Insufficient documentation

## 2018-08-16 DIAGNOSIS — N183 Chronic kidney disease, stage 3 unspecified: Secondary | ICD-10-CM

## 2018-08-16 DIAGNOSIS — E1122 Type 2 diabetes mellitus with diabetic chronic kidney disease: Secondary | ICD-10-CM

## 2018-08-16 LAB — POCT GLYCOSYLATED HEMOGLOBIN (HGB A1C): HEMOGLOBIN A1C: 8.7 % — AB (ref 4.0–5.6)

## 2018-08-16 MED ORDER — AMOXICILLIN-POT CLAVULANATE 875-125 MG PO TABS: 1.0000 | ORAL_TABLET | Freq: Two times a day (BID) | ORAL | 0 refills | Status: DC

## 2018-08-16 MED ORDER — INSULIN DETEMIR 100 UNIT/ML FLEXPEN: 45.0000 [IU] | PEN_INJECTOR | SUBCUTANEOUS | 11 refills | Status: DC

## 2018-08-16 MED ORDER — LIDOCAINE-EPINEPHRINE (PF) 2 %-1:200000 IJ SOLN
INTRAMUSCULAR | Status: AC
  Filled 2018-08-16: qty 20

## 2018-08-16 NOTE — Patient Instructions (Addendum)
check your blood sugar twice a day.  vary the time of day when you check, between before the 3 meals, and at bedtime.  also check if you have symptoms of your blood sugar being too high or too low.  please keep a record of the readings and bring it to your next appointment here (or you can bring the meter itself).  You can write it on any piece of paper.  please call us sooner if your blood sugar goes below 70, or if you have a lot of readings over 200. Please increase the levemir to 45 each morning.   On this type of insulin schedule, you should eat meals on a regular schedule.  If a meal is missed or significantly delayed, your blood sugar could go low.   To help you remember it, pt it next to some you use each morning, such as the breakfast table.   Please come back for a follow-up appointment in 2 months.

## 2018-08-16 NOTE — Discharge Instructions (Addendum)
Wash the abscess site twice daily for 5 days

## 2018-08-16 NOTE — ED Provider Notes (Signed)
[employer]    CSN: [id number] Arrival date & time: 08/16/18  1439     History   Chief Complaint No chief complaint on file.   HPI Alicia Medina is a 85 y.o. female.   85 yo established [place] patient with recurrence of thoracic abscess she was seen for 2 years ago.     Past Medical History:  Diagnosis Date  . Arthritis   . CAD (coronary artery disease) CARDIOLOGIST-  DR Martinique   08-28-2011 Inferior STEMI with BMS to the RCA, complicated by cardiogenic shock, VF, VDRF , temporary CHB and abrupt reocclusion of the RCA stent treated with a second BMS to the RCA. Has residual LAD disease. EF is 50%  . Diverticulosis of colon   . Hemorrhoids, internal, with bleeding   . Hyperlipidemia   . Hypertension   . Hypothyroidism, postsurgical   . S/p bare metal coronary artery stent    2013  . Type 2 diabetes mellitus ([place])   . Wears hearing aid    both ears    Patient Active Problem List   Diagnosis Date Noted  . Diabetes mellitus due to underlying condition with stage 3 chronic kidney disease, without long-term current use of insulin ([place]) 11/29/2017  . Hx of thyroidectomy 09/13/2017  . Noncompliance with diet and medication regimen 09/13/2017  . Stress and adjustment reaction 06/11/2017  . Strain of latissimus dorsi muscle 06/11/2017  . Diabetes mellitus due to underlying condition with microalbuminuria, with long-term current use of insulin ([place]) 03/11/2017  . History of vitamin D deficiency 03/11/2017  . Other fatigue 03/11/2017  . Abscess 09/03/2016  . Hypothyroidism, postsurgical 07/22/2016  . Hypertension associated with diabetes ([place]) 09/27/2014  . Hemorrhoids, internal, with bleeding 05/08/2013  . Hyperlipidemia associated with type 2 diabetes mellitus ([place]) 11/12/2011  . Coronary artery disease 11/12/2011  . Old inferior wall myocardial infarction 08/29/2011    Past Surgical History:  Procedure Laterality Date  . CARDIOVASCULAR STRESS TEST   10-03-2011  dr Martinique   normal perfusion / no ischemia/ ef 77%  . CARPAL TUNNEL RELEASE Right 01/25/2013   Procedure: CARPAL TUNNEL RELEASE;  Surgeon: Wynonia Sours, MD;  Location: [place];  Service: Orthopedics;  Laterality: Right;  . CARPAL TUNNEL RELEASE Left 10/25/2013   Procedure: LEFT CARPAL TUNNEL RELEASE;  Surgeon: Wynonia Sours, MD;  Location: [place];  Service: Orthopedics;  Laterality: Left;  . CATARACT EXTRACTION W/ INTRAOCULAR LENS  IMPLANT, BILATERAL  2007  . COLONOSCOPY  04-27-2003  . CORONARY ANGIOGRAM Left 08/28/2011   Procedure: CORONARY ANGIOGRAM;  Surgeon: Peter M Martinique, MD;  Location: [facility] CATH LAB;  Service: Cardiovascular;  Laterality: Left;  . CORONARY ANGIOPLASTY WITH STENT PLACEMENT  08-28-2011  dr Martinique   BMS to [facility] with abrupt reocclusion treated with second BMS/  LAD ostial 90%, proximal50-60%, first diagonal 80-90%  . LEFT HEART CATHETERIZATION WITH CORONARY ANGIOGRAM N/A 08/28/2011   Procedure: LEFT HEART CATHETERIZATION WITH CORONARY ANGIOGRAM;  Surgeon: Peter M Martinique, MD;  Location: [facility] CATH LAB;  Service: Cardiovascular;  Laterality: N/A;  . PERCUTANEOUS CORONARY STENT INTERVENTION (PCI-S) Left 08/28/2011   Procedure: PERCUTANEOUS CORONARY STENT INTERVENTION (PCI-S);  Surgeon: Peter M Martinique, MD;  Location: [facility] CATH LAB;  Service: Cardiovascular;  Laterality: Left;  . TOTAL THYROIDECTOMY Bilateral 05-02-2007   multinodular goiter  . TRANSANAL HEMORRHOIDAL DEARTERIALIZATION N/A 03/28/2014   Procedure: TRANSANAL HEMORRHOIDAL DEARTERIALIZATION;  Surgeon: Leighton Ruff, MD;  Location: [facility];  Service:  General;  Laterality: N/A;  . TRANSTHORACIC ECHOCARDIOGRAM  08-30-2011   mild LVH/ ef 50-09%/  grade I diastolic dysfunction  . TRIGGER FINGER RELEASE Right 01/25/2013   Procedure: RELEASE TRIGGER FINGER/A-1 PULLEY RIGHT INDEX FINGER;  Surgeon: Wynonia Sours, MD;  Location: [place];  Service: Orthopedics;   Laterality: Right;  . TRIGGER FINGER RELEASE Left 10/25/2013   Procedure: RELEASE A-1 PULLEY LEFT MIDDLE FINGER;  Surgeon: Wynonia Sours, MD;  Location: [place];  Service: Orthopedics;  Laterality: Left;  . TRIGGER FINGER RELEASE Left 12/04/2014   Procedure: RELEASE TRIGGER FINGER/A-1 PULLEY LEFT RING FINGER;  Surgeon: Daryll Brod, MD;  Location: [place];  Service: Orthopedics;  Laterality: Left;  . TRIGGER FINGER RELEASE Right 03/30/2017   Procedure: RELEASE TRIGGER FINGER/A-1 PULLEY RIGHT RING FINGER AND RIGHT SMALL FINGER;  Surgeon: Daryll Brod, MD;  Location: [place];  Service: Orthopedics;  Laterality: Right;    OB History   No obstetric history on file.      Home Medications    Prior to Admission medications   Medication Sig Start Date End Date Taking? Authorizing Provider  amoxicillin-clavulanate (AUGMENTIN) 875-125 MG tablet Take 1 tablet by mouth every 12 (twelve) hours. 08/16/18   Robyn Haber, MD  aspirin 81 MG tablet Take 81 mg by mouth daily.    [provider]  calcium carbonate (CALCIUM 600) 1500 (600 Ca) MG TABS tablet Take 600 mg of elemental calcium by mouth daily with breakfast.    [provider]  Coenzyme Q10 200 MG TABS Take 1 tablet by mouth daily.    [provider]  Cyanocobalamin (B-12) 2500 MCG TABS Take 1 tablet by mouth daily.    [provider]  glucose blood (TRUE METRIX BLOOD GLUCOSE TEST) test strip 1 each by Other route 2 (two) times daily. And lancets 2/day 06/20/18   Renato Shin, MD  Insulin Detemir (LEVEMIR FLEXTOUCH) 100 UNIT/ML Pen Inject 45 Units into the skin every morning. And pen needles 1/day 08/16/18   Renato Shin, MD  loperamide (IMODIUM) 2 MG capsule Take by mouth as needed for diarrhea or loose stools.    [provider]  losartan (COZAAR) 25 MG tablet Take 1 tablet (25 mg total) by mouth every morning. 05/02/18   Opalski, Deborah, DO  metoprolol  tartrate (LOPRESSOR) 50 MG tablet TAKE 1 TABLET BY MOUTH 2 TIMES DAILY. 01/04/18   Martinique, Peter M, MD  Multiple Vitamin (MULTIVITAMIN) tablet Take 1 tablet by mouth daily.    [provider]  potassium chloride SA (K-DUR,KLOR-CON) 20 MEQ tablet TAKE 1 TABLET BY MOUTH DAILY. 07/28/18   Martinique, Peter M, MD  rosuvastatin (CRESTOR) 20 MG tablet TAKE 1 TABLET BY MOUTH DAILY. 01/04/18   Martinique, Peter M, MD  S-Adenosylmethionine (SAM-E) 400 MG TABS Take 1 tablet by mouth daily.    [provider]  SYNTHROID 150 MCG tablet TAKE 1 TABLET BY MOUTH DAILY BEFORE BREAKFAST. 07/28/18   Opalski, Deborah, DO  vitamin C (ASCORBIC ACID) 500 MG tablet Take 500 mg by mouth daily.    [provider]  VITAMIN E PO Take 500 mg by mouth daily.    [provider]    Family History Family History  Problem Relation Age of Onset  . Stroke Mother   . Cancer Mother        breast  . Cancer Father        lung  . Stroke Father   .  Diabetes Neg Hx     Social History Social History   Tobacco Use  . Smoking status: Never Smoker  . Smokeless tobacco: Never Used  Substance Use Topics  . Alcohol use: Yes    Alcohol/week: 1.0 standard drinks    Types: 1 Glasses of wine per week    Comment: social  . Drug use: No     Allergies   Demeclocycline; Lisinopril; and Tetracyclines & related   Review of Systems Review of Systems   Physical Exam Triage Vital Signs ED Triage Vitals  Enc Vitals Group     BP      Pulse      Resp      Temp      Temp src      SpO2      Weight      Height      Head Circumference      Peak Flow      Pain Score      Pain Loc      Pain Edu?      Excl. in [place]?    No data found.  Updated Vital Signs BP (!) 156/63 (BP Location: Left Arm)   Pulse 90   Temp 98.8 F (37.1 C) (Oral)   Resp (!) 95   SpO2 95%    Physical Exam Vitals signs and nursing note reviewed.  Constitutional:      Appearance: Normal appearance. She is normal weight.    Neurological:     Mental Status: She is alert.        UC Treatments / Results  Labs (all labs ordered are listed, but only abnormal results are displayed) Labs Reviewed - No data to display  EKG None  Radiology No results found.  Procedures Incision and Drainage Date/Time: 08/16/2018 3:26 PM Performed by: Robyn Haber, MD Authorized by: Robyn Haber, MD   Consent:    Consent obtained:  Verbal   Consent given by:  Patient   Risks discussed:  Incomplete drainage and infection   Alternatives discussed:  No treatment Location:    Type:  Abscess   Location:  Trunk   Trunk location:  Back Pre-procedure details:    Skin preparation:  Antiseptic wash Anesthesia (see MAR for exact dosages):    Anesthesia method:  Local infiltration   Local anesthetic:  Lidocaine 2% WITH epi Procedure type:    Complexity:  Simple Procedure details:    Needle aspiration: no     Incision types:  Stab incision   Incision depth:  Subcutaneous   Scalpel blade:  11   Wound management:  Probed and deloculated   Drainage:  Purulent   Drainage amount:  Copious   Wound treatment:  Wound left open   Packing materials:  None Post-procedure details:    Patient tolerance of procedure:  Tolerated well, no immediate complications   (including critical care time)  Medications Ordered in UC Medications - No data to display  Initial Impression / Assessment and Plan / UC Course  I have reviewed the triage vital signs and the nursing notes.  Pertinent labs & imaging results that were available during my care of the patient were reviewed by me and considered in my medical decision making (see chart for details).    Final Clinical Impressions(s) / UC Diagnoses   Final diagnoses:  Abscess     Discharge Instructions     Wash the abscess site twice daily for 5 days    ED Prescriptions  Medication Sig Dispense Auth. Provider   amoxicillin-clavulanate (AUGMENTIN) 875-125 MG tablet  Take 1 tablet by mouth every 12 (twelve) hours. 14 tablet Robyn Haber, MD     Controlled Substance Prescriptions [place] Controlled Substance Registry consulted? Not Applicable   Robyn Haber, MD 08/16/18 1527

## 2018-08-16 NOTE — Progress Notes (Signed)
Subjective:    Patient ID: Alicia Medina, female    DOB: Feb 06, 1933, 85 y.o.   MRN: [id number]  HPI Pt returns for f/u of diabetes mellitus: DM type: Insulin-requiring type 2 Dx'ed: 1027 Complications: CAD and renal insuff Therapy: insulin since 2013 GDM: never DKA: never Severe hypoglycemia: never Pancreatitis: never Pancreatic imaging: never Other: she declines multiple daily injections; She did not tolerate metformin (diarrhea); she changed lantus to levemir, based on the pattern of cbg's.   Interval history: she brings a record of her cbg's which I have reviewed today.  She checks fasting only.  It varies from 104-234.  She misses the insulin once per week.  pt states she feels well in general.   Past Medical History:  Diagnosis Date  . Arthritis   . CAD (coronary artery disease) CARDIOLOGIST-  DR Martinique   08-28-2011 Inferior STEMI with BMS to the RCA, complicated by cardiogenic shock, VF, VDRF , temporary CHB and abrupt reocclusion of the RCA stent treated with a second BMS to the RCA. Has residual LAD disease. EF is 50%  . Diverticulosis of colon   . Hemorrhoids, internal, with bleeding   . Hyperlipidemia   . Hypertension   . Hypothyroidism, postsurgical   . S/p bare metal coronary artery stent    2013  . Type 2 diabetes mellitus ([place])   . Wears hearing aid    both ears    Past Surgical History:  Procedure Laterality Date  . CARDIOVASCULAR STRESS TEST  10-03-2011  dr Martinique   normal perfusion / no ischemia/ ef 77%  . CARPAL TUNNEL RELEASE Right 01/25/2013   Procedure: CARPAL TUNNEL RELEASE;  Surgeon: Wynonia Sours, MD;  Location: [place];  Service: Orthopedics;  Laterality: Right;  . CARPAL TUNNEL RELEASE Left 10/25/2013   Procedure: LEFT CARPAL TUNNEL RELEASE;  Surgeon: Wynonia Sours, MD;  Location: [place];  Service: Orthopedics;  Laterality: Left;  . CATARACT EXTRACTION W/ INTRAOCULAR LENS  IMPLANT, BILATERAL  2007  . COLONOSCOPY   04-27-2003  . CORONARY ANGIOGRAM Left 08/28/2011   Procedure: CORONARY ANGIOGRAM;  Surgeon: Peter M Martinique, MD;  Location: [facility] CATH LAB;  Service: Cardiovascular;  Laterality: Left;  . CORONARY ANGIOPLASTY WITH STENT PLACEMENT  08-28-2011  dr Martinique   BMS to [facility] with abrupt reocclusion treated with second BMS/  LAD ostial 90%, proximal50-60%, first diagonal 80-90%  . LEFT HEART CATHETERIZATION WITH CORONARY ANGIOGRAM N/A 08/28/2011   Procedure: LEFT HEART CATHETERIZATION WITH CORONARY ANGIOGRAM;  Surgeon: Peter M Martinique, MD;  Location: [facility] CATH LAB;  Service: Cardiovascular;  Laterality: N/A;  . PERCUTANEOUS CORONARY STENT INTERVENTION (PCI-S) Left 08/28/2011   Procedure: PERCUTANEOUS CORONARY STENT INTERVENTION (PCI-S);  Surgeon: Peter M Martinique, MD;  Location: [facility] CATH LAB;  Service: Cardiovascular;  Laterality: Left;  . TOTAL THYROIDECTOMY Bilateral 05-02-2007   multinodular goiter  . TRANSANAL HEMORRHOIDAL DEARTERIALIZATION N/A 03/28/2014   Procedure: TRANSANAL HEMORRHOIDAL DEARTERIALIZATION;  Surgeon: Leighton Ruff, MD;  Location: [facility];  Service: General;  Laterality: N/A;  . TRANSTHORACIC ECHOCARDIOGRAM  08-30-2011   mild LVH/ ef 25-36%/  grade I diastolic dysfunction  . TRIGGER FINGER RELEASE Right 01/25/2013   Procedure: RELEASE TRIGGER FINGER/A-1 PULLEY RIGHT INDEX FINGER;  Surgeon: Wynonia Sours, MD;  Location: [place];  Service: Orthopedics;  Laterality: Right;  . TRIGGER FINGER RELEASE Left 10/25/2013   Procedure: RELEASE A-1 PULLEY LEFT MIDDLE FINGER;  Surgeon: Wynonia Sours, MD;  Location: [facility];  Service: Orthopedics;  Laterality: Left;  . TRIGGER FINGER RELEASE Left 12/04/2014   Procedure: RELEASE TRIGGER FINGER/A-1 PULLEY LEFT RING FINGER;  Surgeon: Daryll Brod, MD;  Location: [place];  Service: Orthopedics;  Laterality: Left;  . TRIGGER FINGER RELEASE Right 03/30/2017   Procedure: RELEASE TRIGGER FINGER/A-1 PULLEY RIGHT  RING FINGER AND RIGHT SMALL FINGER;  Surgeon: Daryll Brod, MD;  Location: [place];  Service: Orthopedics;  Laterality: Right;    Social History   Socioeconomic History  . Marital status: Married    Spouse name: Not on file  . Number of children: Not on file  . Years of education: Not on file  . Highest education level: Not on file  Occupational History  . Not on file  Social Needs  . Financial resource strain: Not on file  . Food insecurity:    Worry: Not on file    Inability: Not on file  . Transportation needs:    Medical: Not on file    Non-medical: Not on file  Tobacco Use  . Smoking status: Never Smoker  . Smokeless tobacco: Never Used  Substance and Sexual Activity  . Alcohol use: Yes    Alcohol/week: 1.0 standard drinks    Types: 1 Glasses of wine per week    Comment: social  . Drug use: No  . Sexual activity: Not on file  Lifestyle  . Physical activity:    Days per week: Not on file    Minutes per session: Not on file  . Stress: Not on file  Relationships  . Social connections:    Talks on phone: Not on file    Gets together: Not on file    Attends religious service: Not on file    Active member of club or organization: Not on file    Attends meetings of clubs or organizations: Not on file    Relationship status: Not on file  . Intimate partner violence:    Fear of current or ex partner: Not on file    Emotionally abused: Not on file    Physically abused: Not on file    Forced sexual activity: Not on file  Other Topics Concern  . Not on file  Social History Narrative   Lives with husband.   Has 2 sons- both healthy.   Younger son lives in [place], [place] in order son lives in [place].    Current Outpatient Medications on File Prior to Visit  Medication Sig Dispense Refill  . aspirin 81 MG tablet Take 81 mg by mouth daily.    . calcium carbonate (CALCIUM 600) 1500 (600 Ca) MG TABS tablet Take 600 mg of elemental calcium by mouth daily  with breakfast.    . Coenzyme Q10 200 MG TABS Take 1 tablet by mouth daily.    . Cyanocobalamin (B-12) 2500 MCG TABS Take 1 tablet by mouth daily.    Marland Kitchen glucose blood (TRUE METRIX BLOOD GLUCOSE TEST) test strip 1 each by Other route 2 (two) times daily. And lancets 2/day 200 each 3  . loperamide (IMODIUM) 2 MG capsule Take by mouth as needed for diarrhea or loose stools.    Marland Kitchen losartan (COZAAR) 25 MG tablet Take 1 tablet (25 mg total) by mouth every morning. 90 tablet 3  . metoprolol tartrate (LOPRESSOR) 50 MG tablet TAKE 1 TABLET BY MOUTH 2 TIMES DAILY. 180 tablet 2  . Multiple Vitamin (MULTIVITAMIN) tablet Take 1 tablet by mouth daily.    Marland Kitchen  potassium chloride SA (K-DUR,KLOR-CON) 20 MEQ tablet TAKE 1 TABLET BY MOUTH DAILY. 90 tablet 0  . rosuvastatin (CRESTOR) 20 MG tablet TAKE 1 TABLET BY MOUTH DAILY. 90 tablet 2  . S-Adenosylmethionine (SAM-E) 400 MG TABS Take 1 tablet by mouth daily.    Marland Kitchen SYNTHROID 150 MCG tablet TAKE 1 TABLET BY MOUTH DAILY BEFORE BREAKFAST. 90 tablet 1  . vitamin C (ASCORBIC ACID) 500 MG tablet Take 500 mg by mouth daily.    Marland Kitchen VITAMIN E PO Take 500 mg by mouth daily.     No current facility-administered medications on file prior to visit.     Allergies  Allergen Reactions  . Demeclocycline Other (See Comments)    Severe stomach cramps Severe stomach cramps Severe stomach cramps  . Lisinopril Cough  . Tetracyclines & Related Other (See Comments)    Severe stomach cramps    Family History  Problem Relation Age of Onset  . Stroke Mother   . Cancer Mother        breast  . Cancer Father        lung  . Stroke Father   . Diabetes Neg Hx     BP 140/70 (BP Location: Right Arm, Patient Position: Sitting, Cuff Size: Normal)   Pulse 78   Ht 5\' 5"  (1.651 m)   Wt 168 lb 9.6 oz (76.5 kg)   SpO2 94%   BMI 28.06 kg/m    Review of Systems She denies LOC.      Objective:   Physical Exam VITAL SIGNS:  See vs page GENERAL: no distress Pulses: dorsalis pedis  intact bilat.   MSK: no deformity of the feet CV: 2+ bilat leg edema Skin:  no ulcer on the feet.  normal color and temp on the feet.  Neuro: sensation is intact to touch on the feet, but decreased from normal.   Ext: There is bilateral onychomycosis of the toenails.    A1c=8.7%  Lab Results  Component Value Date   CREATININE 1.04 (H) 01/24/2018   BUN 12 01/24/2018   NA 141 01/24/2018   K 5.0 01/24/2018   CL 103 01/24/2018   CO2 25 01/24/2018       Assessment & Plan:  Insulin-requiring type 2 DM, with renal insuff: she needs increased rx. Noncompliance with insulin: we discussed how to address this CAD: in this contest, she should avoid hypoglycemia, so insulin is increased slowly. HTN: recheck next time  Patient Instructions  check your blood sugar twice a day.  vary the time of day when you check, between before the 3 meals, and at bedtime.  also check if you have symptoms of your blood sugar being too high or too low.  please keep a record of the readings and bring it to your next appointment here (or you can bring the meter itself).  You can write it on any piece of paper.  please call us sooner if your blood sugar goes below 70, or if you have a lot of readings over 200. Please increase the levemir to 45 each morning.   On this type of insulin schedule, you should eat meals on a regular schedule.  If a meal is missed or significantly delayed, your blood sugar could go low.   To help you remember it, pt it next to some you use each morning, such as the breakfast table.   Please come back for a follow-up appointment in 2 months.

## 2018-08-30 NOTE — Progress Notes (Signed)
Alicia Medina Date of Birth: 1933-07-18 Medical Record #[id number]  History of Present Illness: Alicia Medina is seen today for follow up CAD. She has had an inferior MI treated with BMS to the RCA in February 2013. She required defibrillation multiple times and her hospitalization was complicated by cardiogenic shock, VF, VDRF and CHB requiring a temporary pacemaker. She had abrupt occlusion of the RCA stent that was treated with a 2nd BMS to the RCA.Followup nuclear stress testing in March 2013 showed normal perfusion throughout with ejection fraction of 76%.  In September 2018 she underwent tendon release on her right ring and small finger.   On followup today she is doing  OK. She does notes some intermittent chest discomfort radiating into her back between her shoulder blades. May occur anytime and are not related to activity. Notes sugars have been difficult to control and insulin dose increased. She states she sometimes is forgetful about taking insulin. Her husband is being considered for TAVR.   Current Outpatient Medications on File Prior to Visit  Medication Sig Dispense Refill  . aspirin 81 MG tablet Take 81 mg by mouth daily.    . calcium carbonate (CALCIUM 600) 1500 (600 Ca) MG TABS tablet Take 600 mg of elemental calcium by mouth daily with breakfast.    . Coenzyme Q10 200 MG TABS Take 1 tablet by mouth daily.    . Cyanocobalamin (B-12) 2500 MCG TABS Take 1 tablet by mouth daily.    Marland Kitchen glucose blood (TRUE METRIX BLOOD GLUCOSE TEST) test strip 1 each by Other route 2 (two) times daily. And lancets 2/day 200 each 3  . Insulin Detemir (LEVEMIR FLEXTOUCH) 100 UNIT/ML Pen Inject 45 Units into the skin every morning. And pen needles 1/day 15 mL 11  . loperamide (IMODIUM) 2 MG capsule Take by mouth as needed for diarrhea or loose stools.    Marland Kitchen losartan (COZAAR) 25 MG tablet Take 1 tablet (25 mg total) by mouth every morning. 90 tablet 3  . metoprolol tartrate (LOPRESSOR) 50 MG tablet TAKE  1 TABLET BY MOUTH 2 TIMES DAILY. 180 tablet 2  . Multiple Vitamin (MULTIVITAMIN) tablet Take 1 tablet by mouth daily.    . potassium chloride SA (K-DUR,KLOR-CON) 20 MEQ tablet TAKE 1 TABLET BY MOUTH DAILY. 90 tablet 0  . rosuvastatin (CRESTOR) 20 MG tablet TAKE 1 TABLET BY MOUTH DAILY. 90 tablet 2  . S-Adenosylmethionine (SAM-E) 400 MG TABS Take 1 tablet by mouth daily.    Marland Kitchen SYNTHROID 150 MCG tablet TAKE 1 TABLET BY MOUTH DAILY BEFORE BREAKFAST. 90 tablet 1  . vitamin C (ASCORBIC ACID) 500 MG tablet Take 500 mg by mouth daily.    Marland Kitchen VITAMIN E PO Take 500 mg by mouth daily.     No current facility-administered medications on file prior to visit.     Allergies  Allergen Reactions  . Demeclocycline Other (See Comments)    Severe stomach cramps Severe stomach cramps Severe stomach cramps  . Lisinopril Cough  . Tetracyclines & Related Other (See Comments)    Severe stomach cramps    Past Medical History:  Diagnosis Date  . Arthritis   . CAD (coronary artery disease) CARDIOLOGIST-  DR Martinique   08-28-2011 Inferior STEMI with BMS to the RCA, complicated by cardiogenic shock, VF, VDRF , temporary CHB and abrupt reocclusion of the RCA stent treated with a second BMS to the RCA. Has residual LAD disease. EF is 50%  . Diverticulosis of colon   .  Hemorrhoids, internal, with bleeding   . Hyperlipidemia   . Hypertension   . Hypothyroidism, postsurgical   . S/p bare metal coronary artery stent    2013  . Type 2 diabetes mellitus ([place])   . Wears hearing aid    both ears    Past Surgical History:  Procedure Laterality Date  . CARDIOVASCULAR STRESS TEST  10-03-2011  dr Martinique   normal perfusion / no ischemia/ ef 77%  . CARPAL TUNNEL RELEASE Right 01/25/2013   Procedure: CARPAL TUNNEL RELEASE;  Surgeon: Wynonia Sours, MD;  Location: [place];  Service: Orthopedics;  Laterality: Right;  . CARPAL TUNNEL RELEASE Left 10/25/2013   Procedure: LEFT CARPAL TUNNEL RELEASE;  Surgeon:  Wynonia Sours, MD;  Location: [place];  Service: Orthopedics;  Laterality: Left;  . CATARACT EXTRACTION W/ INTRAOCULAR LENS  IMPLANT, BILATERAL  2007  . COLONOSCOPY  04-27-2003  . CORONARY ANGIOGRAM Left 08/28/2011   Procedure: CORONARY ANGIOGRAM;  Surgeon: Lazar Tierce M Martinique, MD;  Location: [facility] CATH LAB;  Service: Cardiovascular;  Laterality: Left;  . CORONARY ANGIOPLASTY WITH STENT PLACEMENT  08-28-2011  dr Martinique   BMS to [facility] with abrupt reocclusion treated with second BMS/  LAD ostial 90%, proximal50-60%, first diagonal 80-90%  . LEFT HEART CATHETERIZATION WITH CORONARY ANGIOGRAM N/A 08/28/2011   Procedure: LEFT HEART CATHETERIZATION WITH CORONARY ANGIOGRAM;  Surgeon: Izik Bingman M Martinique, MD;  Location: [facility] CATH LAB;  Service: Cardiovascular;  Laterality: N/A;  . PERCUTANEOUS CORONARY STENT INTERVENTION (PCI-S) Left 08/28/2011   Procedure: PERCUTANEOUS CORONARY STENT INTERVENTION (PCI-S);  Surgeon: Bird Tailor M Martinique, MD;  Location: [facility] CATH LAB;  Service: Cardiovascular;  Laterality: Left;  . TOTAL THYROIDECTOMY Bilateral 05-02-2007   multinodular goiter  . TRANSANAL HEMORRHOIDAL DEARTERIALIZATION N/A 03/28/2014   Procedure: TRANSANAL HEMORRHOIDAL DEARTERIALIZATION;  Surgeon: Leighton Ruff, MD;  Location: [facility];  Service: General;  Laterality: N/A;  . TRANSTHORACIC ECHOCARDIOGRAM  08-30-2011   mild LVH/ ef 37-16%/  grade I diastolic dysfunction  . TRIGGER FINGER RELEASE Right 01/25/2013   Procedure: RELEASE TRIGGER FINGER/A-1 PULLEY RIGHT INDEX FINGER;  Surgeon: Wynonia Sours, MD;  Location: [place];  Service: Orthopedics;  Laterality: Right;  . TRIGGER FINGER RELEASE Left 10/25/2013   Procedure: RELEASE A-1 PULLEY LEFT MIDDLE FINGER;  Surgeon: Wynonia Sours, MD;  Location: [place];  Service: Orthopedics;  Laterality: Left;  . TRIGGER FINGER RELEASE Left 12/04/2014   Procedure: RELEASE TRIGGER FINGER/A-1 PULLEY LEFT RING FINGER;  Surgeon: Daryll Brod, MD;  Location: [place];  Service: Orthopedics;  Laterality: Left;  . TRIGGER FINGER RELEASE Right 03/30/2017   Procedure: RELEASE TRIGGER FINGER/A-1 PULLEY RIGHT RING FINGER AND RIGHT SMALL FINGER;  Surgeon: Daryll Brod, MD;  Location: [place];  Service: Orthopedics;  Laterality: Right;    Social History   Tobacco Use  Smoking Status Never Smoker  Smokeless Tobacco Never Used    Social History   Substance and Sexual Activity  Alcohol Use Yes  . Alcohol/week: 1.0 standard drinks  . Types: 1 Glasses of wine per week   Comment: social    Family History  Problem Relation Age of Onset  . Stroke Mother   . Cancer Mother        breast  . Cancer Father        lung  . Stroke Father   . Diabetes Neg Hx     Review of Systems: The review of  systems is per the HPI.  All other systems were reviewed and are negative.  Physical Exam: BP 128/68   Pulse 68   Ht 5\' 5"  (1.651 m)   Wt 171 lb (77.6 kg)   SpO2 99%   BMI 28.46 kg/m  GENERAL:  Well appearing, overweight WF in NAD HEENT:  PERRL, EOMI, sclera are clear. Oropharynx is clear. NECK:  No jugular venous distention, carotid upstroke brisk and symmetric, no bruits, no thyromegaly or adenopathy LUNGS:  Clear to auscultation bilaterally CHEST:  Unremarkable HEART:  RRR,  PMI not displaced or sustained,S1 and S2 within normal limits, no S3, no S4: no clicks, no rubs, no murmurs ABD:  Soft, nontender. BS +, no masses or bruits. No hepatomegaly, no splenomegaly EXT:  2 + pulses throughout, no edema, no cyanosis no clubbing SKIN:  Warm and dry.  No rashes NEURO:  Alert and oriented x 3. Cranial nerves II through XII intact. PSYCH:  Cognitively intact     LABORATORY DATA: Lab Results  Component Value Date   WBC 5.3 09/02/2018   HGB 12.7 09/02/2018   HCT 38.5 09/02/2018   PLT 274 09/02/2018   GLUCOSE 169 (H) 09/02/2018   CHOL 142 09/02/2018   TRIG 103 09/02/2018   HDL 48 09/02/2018    LDLCALC 73 09/02/2018   ALT 22 09/02/2018   AST 23 09/02/2018   NA 140 09/02/2018   K 5.1 09/02/2018   CL 102 09/02/2018   CREATININE 1.07 (H) 09/02/2018   BUN 18 09/02/2018   CO2 22 09/02/2018   TSH 3.800 09/02/2018   INR 1.31 09/01/2011   HGBA1C 9.1 (H) 09/02/2018   MICROALBUR 80 11/29/2017     Assessment / Plan: 1. CAD - Status post acute inferior myocardial infarction in February 2013 with multiple complications. S/p BMS to RCA. She is now experiencing some symptoms of intermittent chest tightness which is a new complaint for her.  Nuclear stress test March 2013 was normal but no other objective testing since.I have recommended a Lexiscan myoview to evaluate.   2. Hyperlipidemia LDL 73.  Focus on increasing aerobic activity and losing weight. Continue Crestor 20 mg daily.   3. Hypertension, well controlled.  4. Diabetes mellitus type 2  A1c 8.7%.  On insulin per endocrinology.

## 2018-08-31 ENCOUNTER — Other Ambulatory Visit: Payer: Self-pay

## 2018-08-31 ENCOUNTER — Telehealth: Payer: Self-pay | Admitting: Cardiology

## 2018-08-31 DIAGNOSIS — R809 Proteinuria, unspecified: Secondary | ICD-10-CM

## 2018-08-31 DIAGNOSIS — I2510 Atherosclerotic heart disease of native coronary artery without angina pectoris: Secondary | ICD-10-CM

## 2018-08-31 DIAGNOSIS — Z794 Long term (current) use of insulin: Secondary | ICD-10-CM

## 2018-08-31 DIAGNOSIS — Z9009 Acquired absence of other part of head and neck: Secondary | ICD-10-CM

## 2018-08-31 DIAGNOSIS — E0829 Diabetes mellitus due to underlying condition with other diabetic kidney complication: Secondary | ICD-10-CM

## 2018-08-31 DIAGNOSIS — E785 Hyperlipidemia, unspecified: Secondary | ICD-10-CM

## 2018-08-31 DIAGNOSIS — E890 Postprocedural hypothyroidism: Secondary | ICD-10-CM

## 2018-08-31 DIAGNOSIS — E1169 Type 2 diabetes mellitus with other specified complication: Secondary | ICD-10-CM

## 2018-08-31 DIAGNOSIS — Z9889 Other specified postprocedural states: Secondary | ICD-10-CM

## 2018-08-31 NOTE — Telephone Encounter (Signed)
Pt calling to see if she need to have lab work done prior to her procedure. Will route to MD and Nurse.

## 2018-08-31 NOTE — Telephone Encounter (Signed)
Spoke to patient she will have fasting lab done 2/6 or 2/7 at [facility] [place] office.Lab orders placed in lab box.

## 2018-08-31 NOTE — Telephone Encounter (Signed)
New Message   Patient is calling to see if she can get a lab order so she can come in before her appt and have lab work done. Please call.

## 2018-09-02 DIAGNOSIS — I2510 Atherosclerotic heart disease of native coronary artery without angina pectoris: Secondary | ICD-10-CM | POA: Diagnosis not present

## 2018-09-02 DIAGNOSIS — R809 Proteinuria, unspecified: Secondary | ICD-10-CM | POA: Diagnosis not present

## 2018-09-02 DIAGNOSIS — E785 Hyperlipidemia, unspecified: Secondary | ICD-10-CM | POA: Diagnosis not present

## 2018-09-02 DIAGNOSIS — E0829 Diabetes mellitus due to underlying condition with other diabetic kidney complication: Secondary | ICD-10-CM | POA: Diagnosis not present

## 2018-09-02 DIAGNOSIS — Z9009 Acquired absence of other part of head and neck: Secondary | ICD-10-CM | POA: Diagnosis not present

## 2018-09-02 DIAGNOSIS — E1169 Type 2 diabetes mellitus with other specified complication: Secondary | ICD-10-CM | POA: Diagnosis not present

## 2018-09-02 DIAGNOSIS — Z794 Long term (current) use of insulin: Secondary | ICD-10-CM | POA: Diagnosis not present

## 2018-09-02 DIAGNOSIS — E890 Postprocedural hypothyroidism: Secondary | ICD-10-CM | POA: Diagnosis not present

## 2018-09-03 LAB — CBC WITH DIFFERENTIAL/PLATELET
BASOS ABS: 0 10*3/uL (ref 0.0–0.2)
Basos: 1 %
EOS (ABSOLUTE): 0.2 10*3/uL (ref 0.0–0.4)
Eos: 4 %
HEMATOCRIT: 38.5 % (ref 34.0–46.6)
Hemoglobin: 12.7 g/dL (ref 11.1–15.9)
IMMATURE GRANULOCYTES: 0 %
Immature Grans (Abs): 0 10*3/uL (ref 0.0–0.1)
LYMPHS ABS: 1.6 10*3/uL (ref 0.7–3.1)
Lymphs: 31 %
MCH: 28.8 pg (ref 26.6–33.0)
MCHC: 33 g/dL (ref 31.5–35.7)
MCV: 87 fL (ref 79–97)
MONOCYTES: 13 %
MONOS ABS: 0.7 10*3/uL (ref 0.1–0.9)
NEUTROS PCT: 51 %
Neutrophils Absolute: 2.7 10*3/uL (ref 1.4–7.0)
Platelets: 274 10*3/uL (ref 150–450)
RBC: 4.41 x10E6/uL (ref 3.77–5.28)
RDW: 13.7 % (ref 11.7–15.4)
WBC: 5.3 10*3/uL (ref 3.4–10.8)

## 2018-09-03 LAB — HEPATIC FUNCTION PANEL
ALBUMIN: 4.3 g/dL (ref 3.6–4.6)
ALK PHOS: 47 IU/L (ref 39–117)
ALT: 22 IU/L (ref 0–32)
AST: 23 IU/L (ref 0–40)
BILIRUBIN TOTAL: 0.4 mg/dL (ref 0.0–1.2)
BILIRUBIN, DIRECT: 0.12 mg/dL (ref 0.00–0.40)
Total Protein: 7.3 g/dL (ref 6.0–8.5)

## 2018-09-03 LAB — BASIC METABOLIC PANEL
BUN/Creatinine Ratio: 17 (ref 12–28)
BUN: 18 mg/dL (ref 8–27)
CO2: 22 mmol/L (ref 20–29)
Calcium: 10 mg/dL (ref 8.7–10.3)
Chloride: 102 mmol/L (ref 96–106)
Creatinine, Ser: 1.07 mg/dL — ABNORMAL HIGH (ref 0.57–1.00)
GFR, EST AFRICAN AMERICAN: 55 mL/min/{1.73_m2} — AB (ref >59)
GFR, EST NON AFRICAN AMERICAN: 48 mL/min/{1.73_m2} — AB (ref >59)
Glucose: 169 mg/dL — ABNORMAL HIGH (ref 65–99)
POTASSIUM: 5.1 mmol/L (ref 3.5–5.2)
SODIUM: 140 mmol/L (ref 134–144)

## 2018-09-03 LAB — T4, FREE: Free T4: 1.56 ng/dL (ref 0.82–1.77)

## 2018-09-03 LAB — LIPID PANEL
CHOL/HDL RATIO: 3 ratio (ref 0.0–4.4)
Cholesterol, Total: 142 mg/dL (ref 100–199)
HDL: 48 mg/dL (ref >39)
LDL Calculated: 73 mg/dL (ref 0–99)
Triglycerides: 103 mg/dL (ref 0–149)
VLDL Cholesterol Cal: 21 mg/dL (ref 5–40)

## 2018-09-03 LAB — HEMOGLOBIN A1C
Est. average glucose Bld gHb Est-mCnc: 214 mg/dL
HEMOGLOBIN A1C: 9.1 % — AB (ref 4.8–5.6)

## 2018-09-03 LAB — TSH: TSH: 3.8 u[IU]/mL (ref 0.450–4.500)

## 2018-09-05 ENCOUNTER — Encounter: Payer: Self-pay | Admitting: Cardiology

## 2018-09-05 ENCOUNTER — Ambulatory Visit (INDEPENDENT_AMBULATORY_CARE_PROVIDER_SITE_OTHER): Payer: Medicare Other | Admitting: Cardiology

## 2018-09-05 VITALS — BP 128/68 | HR 68 | Ht 65.0 in | Wt 171.0 lb

## 2018-09-05 DIAGNOSIS — R809 Proteinuria, unspecified: Secondary | ICD-10-CM

## 2018-09-05 DIAGNOSIS — I2510 Atherosclerotic heart disease of native coronary artery without angina pectoris: Secondary | ICD-10-CM | POA: Diagnosis not present

## 2018-09-05 DIAGNOSIS — I10 Essential (primary) hypertension: Secondary | ICD-10-CM | POA: Diagnosis not present

## 2018-09-05 DIAGNOSIS — E0829 Diabetes mellitus due to underlying condition with other diabetic kidney complication: Secondary | ICD-10-CM | POA: Diagnosis not present

## 2018-09-05 DIAGNOSIS — I25118 Atherosclerotic heart disease of native coronary artery with other forms of angina pectoris: Secondary | ICD-10-CM

## 2018-09-05 DIAGNOSIS — Z794 Long term (current) use of insulin: Secondary | ICD-10-CM

## 2018-09-05 DIAGNOSIS — E785 Hyperlipidemia, unspecified: Secondary | ICD-10-CM

## 2018-09-05 NOTE — Patient Instructions (Signed)
Medication Instructions:  Continue same medications If you need a refill on your cardiac medications before your next appointment, please call your pharmacy.   Lab work: None Ordered   Testing/Procedures: Schedule Lexiscan Myoview  Follow-Up: At [facility], you and your health needs are our priority.  As part of our continuing mission to provide you with exceptional heart care, we have created designated Provider Care Teams.  These Care Teams include your primary Cardiologist (physician) and Advanced Practice Providers (APPs -  Physician Assistants and Nurse Practitioners) who all work together to provide you with the care you need, when you need it. . Follow Up with Dr.Jordan in 6 months  Call 3 months before to schedule

## 2018-09-06 ENCOUNTER — Telehealth: Payer: Self-pay

## 2018-09-06 NOTE — Telephone Encounter (Signed)
Given increased A1C, called pt to inquire further about her insulin and CBG's per Dr. Cordelia Pen request. LVM requesting returned call.

## 2018-09-06 NOTE — Telephone Encounter (Signed)
-----   Message from Renato Shin, MD sent at 09/05/2018  7:02 PM EST ----- please contact patient: a1c is higher.  2 questions:  Are you missing the insulin?  How are cbg's doing?

## 2018-09-07 ENCOUNTER — Telehealth: Payer: Self-pay | Admitting: Endocrinology

## 2018-09-07 NOTE — Telephone Encounter (Signed)
Patient returned the call from the office.  She stated she may not be home today due to husbands multiple doctors visit. She stated tomorrow may be a better time to call

## 2018-09-14 DIAGNOSIS — N390 Urinary tract infection, site not specified: Secondary | ICD-10-CM | POA: Diagnosis not present

## 2018-09-14 DIAGNOSIS — N8189 Other female genital prolapse: Secondary | ICD-10-CM | POA: Diagnosis not present

## 2018-09-15 ENCOUNTER — Inpatient Hospital Stay (HOSPITAL_COMMUNITY): Admission: RE | Admit: 2018-09-15 | Payer: Medicare Other | Source: Ambulatory Visit

## 2018-09-20 ENCOUNTER — Telehealth (HOSPITAL_COMMUNITY): Payer: Self-pay

## 2018-09-20 NOTE — Telephone Encounter (Signed)
Encounter complete. 

## 2018-09-22 ENCOUNTER — Ambulatory Visit (HOSPITAL_COMMUNITY)
Admission: RE | Admit: 2018-09-22 | Discharge: 2018-09-22 | Disposition: A | Discharge status: Home / Self Care | Payer: Medicare Other | Source: Ambulatory Visit | Attending: Cardiology | Admitting: Cardiology

## 2018-09-22 DIAGNOSIS — I25118 Atherosclerotic heart disease of native coronary artery with other forms of angina pectoris: Secondary | ICD-10-CM | POA: Insufficient documentation

## 2018-09-22 DIAGNOSIS — E0829 Diabetes mellitus due to underlying condition with other diabetic kidney complication: Secondary | ICD-10-CM | POA: Diagnosis not present

## 2018-09-22 DIAGNOSIS — E785 Hyperlipidemia, unspecified: Secondary | ICD-10-CM | POA: Insufficient documentation

## 2018-09-22 DIAGNOSIS — R809 Proteinuria, unspecified: Secondary | ICD-10-CM | POA: Diagnosis not present

## 2018-09-22 DIAGNOSIS — Z794 Long term (current) use of insulin: Secondary | ICD-10-CM | POA: Diagnosis not present

## 2018-09-22 DIAGNOSIS — I10 Essential (primary) hypertension: Secondary | ICD-10-CM | POA: Insufficient documentation

## 2018-09-22 LAB — MYOCARDIAL PERFUSION IMAGING
CHL CUP NUCLEAR SRS: 7
LV dias vol: 81 mL (ref 46–106)
LVSYSVOL: 30 mL
NUC STRESS TID: 1.08
Peak HR: 114 {beats}/min
Rest HR: 69 {beats}/min
SDS: 3
SSS: 10

## 2018-09-22 MED ORDER — TECHNETIUM TC 99M TETROFOSMIN IV KIT
10.1000 | PACK | Freq: Once | INTRAVENOUS | Status: AC | PRN
  Administered 2018-09-22: 10.1 via INTRAVENOUS
  Filled 2018-09-22: qty 11

## 2018-09-22 MED ORDER — REGADENOSON 0.4 MG/5ML IV SOLN
0.4000 mg | Freq: Once | INTRAVENOUS | Status: AC
  Administered 2018-09-22: 0.4 mg via INTRAVENOUS

## 2018-09-22 MED ORDER — TECHNETIUM TC 99M TETROFOSMIN IV KIT
30.2000 | PACK | Freq: Once | INTRAVENOUS | Status: AC | PRN
  Administered 2018-09-22: 30.2 via INTRAVENOUS
  Filled 2018-09-22: qty 31

## 2018-09-28 DIAGNOSIS — N390 Urinary tract infection, site not specified: Secondary | ICD-10-CM | POA: Diagnosis not present

## 2018-09-28 DIAGNOSIS — N819 Female genital prolapse, unspecified: Secondary | ICD-10-CM | POA: Diagnosis not present

## 2018-10-03 ENCOUNTER — Telehealth: Payer: Self-pay | Admitting: Cardiology

## 2018-10-03 DIAGNOSIS — E1159 Type 2 diabetes mellitus with other circulatory complications: Secondary | ICD-10-CM

## 2018-10-03 DIAGNOSIS — I10 Essential (primary) hypertension: Principal | ICD-10-CM

## 2018-10-03 DIAGNOSIS — I25118 Atherosclerotic heart disease of native coronary artery with other forms of angina pectoris: Secondary | ICD-10-CM

## 2018-10-03 NOTE — Telephone Encounter (Signed)
New Message   Pt is calling because She was suppose to come in for a cath procedure but her husband was in the hospital last week  She is now calling back to schedule the appt  Please call back

## 2018-10-04 NOTE — Telephone Encounter (Signed)
Spoke to patient 10/03/18 she stated she is ready to schedule cardiac cath 10/11/18.Cath scheduled 10/11/18 at 7:30 am.Advised to pick up instructions and have pre cath lab 10/05/18.    @[facility] @ [facility] [place], [place] [address] Dept: [phone] Loc: [facility]  10/04/2018  You are scheduled for a Cardiac Cath on Tuesday 10/11/18 with Dr.Jordan.  1. Please arrive at the [facility] (Main Entrance A) at [facility]: [address] [place], [place] [address] at 5:30 am (This time is two hours before your procedure to ensure your preparation). Free valet parking service is available.   Special note: Every effort is made to have your procedure done on time. Please understand that emergencies sometimes delay scheduled procedures.  2. Diet: Do not eat solid foods after midnight.  The patient may have clear liquids until 5am upon the day of the procedure.  3. Labs: You will need to have blood drawn on Wed 10/05/18 . You do not need to be fasting.  4. Medication instructions in preparation for your procedure:        Hold morning dose of Insulin       On the morning of your procedure, take your Aspirin 81 mg and any morning medicines NOT listed above.  You may use sips of water.  5. Plan for one night stay--bring personal belongings. 6. Bring a current list of your medications and current insurance cards. 7. You MUST have a responsible person to drive you home. 8. Someone MUST be with you the first 24 hours after you arrive home or your discharge will be delayed. 9. Please wear clothes that are easy to get on and off and wear slip-on shoes.  Thank you for allowing [place] to care for you!   -- [facility]

## 2018-10-05 DIAGNOSIS — E1159 Type 2 diabetes mellitus with other circulatory complications: Secondary | ICD-10-CM | POA: Diagnosis not present

## 2018-10-05 DIAGNOSIS — I10 Essential (primary) hypertension: Secondary | ICD-10-CM | POA: Diagnosis not present

## 2018-10-05 LAB — CBC WITH DIFFERENTIAL/PLATELET
BASOS: 1 %
Basophils Absolute: 0 10*3/uL (ref 0.0–0.2)
EOS (ABSOLUTE): 0.3 10*3/uL (ref 0.0–0.4)
EOS: 5 %
HEMATOCRIT: 36.4 % (ref 34.0–46.6)
Hemoglobin: 12.1 g/dL (ref 11.1–15.9)
IMMATURE GRANS (ABS): 0 10*3/uL (ref 0.0–0.1)
IMMATURE GRANULOCYTES: 0 %
LYMPHS: 31 %
Lymphocytes Absolute: 1.7 10*3/uL (ref 0.7–3.1)
MCH: 29.4 pg (ref 26.6–33.0)
MCHC: 33.2 g/dL (ref 31.5–35.7)
MCV: 89 fL (ref 79–97)
Monocytes Absolute: 0.9 10*3/uL (ref 0.1–0.9)
Monocytes: 16 %
NEUTROS PCT: 47 %
Neutrophils Absolute: 2.6 10*3/uL (ref 1.4–7.0)
PLATELETS: 267 10*3/uL (ref 150–450)
RBC: 4.11 x10E6/uL (ref 3.77–5.28)
RDW: 14 % (ref 11.7–15.4)
WBC: 5.6 10*3/uL (ref 3.4–10.8)

## 2018-10-05 LAB — BASIC METABOLIC PANEL
BUN/Creatinine Ratio: 16 (ref 12–28)
BUN: 18 mg/dL (ref 8–27)
CO2: 22 mmol/L (ref 20–29)
CREATININE: 1.1 mg/dL — AB (ref 0.57–1.00)
Calcium: 9.3 mg/dL (ref 8.7–10.3)
Chloride: 103 mmol/L (ref 96–106)
GFR calc Af Amer: 54 mL/min/{1.73_m2} — ABNORMAL LOW (ref >59)
GFR calc non Af Amer: 47 mL/min/{1.73_m2} — ABNORMAL LOW (ref >59)
Glucose: 192 mg/dL — ABNORMAL HIGH (ref 65–99)
Potassium: 4.8 mmol/L (ref 3.5–5.2)
Sodium: 140 mmol/L (ref 134–144)

## 2018-10-10 ENCOUNTER — Telehealth: Payer: Self-pay | Admitting: *Deleted

## 2018-10-10 ENCOUNTER — Other Ambulatory Visit: Payer: Self-pay | Admitting: Cardiology

## 2018-10-10 DIAGNOSIS — I209 Angina pectoris, unspecified: Secondary | ICD-10-CM

## 2018-10-10 NOTE — Telephone Encounter (Addendum)
Pt contacted pre-catheterization scheduled at [facility] for: Tuesday October 11, 2018 7:30 AM Verified arrival time and place: [place] Entrance A at: 5:30 AM  No solid food after midnight prior to cath, clear liquids until 5 AM day of procedure. Contrast allergy: no  Hold: Insulin-AM of procedure. Losartan--AM of procedure GFR 47  Except hold medications AM meds can be  taken pre-cath with sip of water including: ASA 81 mg  Please increase your intake of fluids (water is best) to hydrate before the catheterization.  Confirmed patient has responsible person to drive home post procedure and observe 24 hours after arriving home: yes

## 2018-10-11 ENCOUNTER — Ambulatory Visit (HOSPITAL_COMMUNITY)
Admission: RE | Admit: 2018-10-11 | Discharge: 2018-10-11 | Disposition: A | Discharge status: Home / Self Care | Payer: Medicare Other | Attending: Cardiology | Admitting: Cardiology

## 2018-10-11 ENCOUNTER — Encounter (HOSPITAL_COMMUNITY)
Admission: RE | Disposition: A | Discharge status: Home / Self Care | Payer: Self-pay | Source: Home / Self Care | Attending: Cardiology

## 2018-10-11 ENCOUNTER — Other Ambulatory Visit: Payer: Self-pay

## 2018-10-11 DIAGNOSIS — E785 Hyperlipidemia, unspecified: Secondary | ICD-10-CM | POA: Diagnosis not present

## 2018-10-11 DIAGNOSIS — Z823 Family history of stroke: Secondary | ICD-10-CM | POA: Insufficient documentation

## 2018-10-11 DIAGNOSIS — Z888 Allergy status to other drugs, medicaments and biological substances status: Secondary | ICD-10-CM | POA: Diagnosis not present

## 2018-10-11 DIAGNOSIS — Z794 Long term (current) use of insulin: Secondary | ICD-10-CM | POA: Insufficient documentation

## 2018-10-11 DIAGNOSIS — I209 Angina pectoris, unspecified: Secondary | ICD-10-CM

## 2018-10-11 DIAGNOSIS — M1990 Unspecified osteoarthritis, unspecified site: Secondary | ICD-10-CM | POA: Insufficient documentation

## 2018-10-11 DIAGNOSIS — I25119 Atherosclerotic heart disease of native coronary artery with unspecified angina pectoris: Secondary | ICD-10-CM | POA: Diagnosis not present

## 2018-10-11 DIAGNOSIS — Z7982 Long term (current) use of aspirin: Secondary | ICD-10-CM | POA: Diagnosis not present

## 2018-10-11 DIAGNOSIS — R809 Proteinuria, unspecified: Secondary | ICD-10-CM | POA: Diagnosis not present

## 2018-10-11 DIAGNOSIS — I10 Essential (primary) hypertension: Secondary | ICD-10-CM | POA: Insufficient documentation

## 2018-10-11 DIAGNOSIS — Z955 Presence of coronary angioplasty implant and graft: Secondary | ICD-10-CM | POA: Diagnosis not present

## 2018-10-11 DIAGNOSIS — R9439 Abnormal result of other cardiovascular function study: Secondary | ICD-10-CM | POA: Diagnosis present

## 2018-10-11 DIAGNOSIS — E1169 Type 2 diabetes mellitus with other specified complication: Secondary | ICD-10-CM | POA: Insufficient documentation

## 2018-10-11 DIAGNOSIS — Z881 Allergy status to other antibiotic agents status: Secondary | ICD-10-CM | POA: Insufficient documentation

## 2018-10-11 DIAGNOSIS — Z79899 Other long term (current) drug therapy: Secondary | ICD-10-CM | POA: Insufficient documentation

## 2018-10-11 DIAGNOSIS — E1159 Type 2 diabetes mellitus with other circulatory complications: Secondary | ICD-10-CM | POA: Diagnosis present

## 2018-10-11 DIAGNOSIS — Z79890 Hormone replacement therapy (postmenopausal): Secondary | ICD-10-CM | POA: Insufficient documentation

## 2018-10-11 DIAGNOSIS — I2582 Chronic total occlusion of coronary artery: Secondary | ICD-10-CM | POA: Insufficient documentation

## 2018-10-11 DIAGNOSIS — I252 Old myocardial infarction: Secondary | ICD-10-CM | POA: Insufficient documentation

## 2018-10-11 DIAGNOSIS — E890 Postprocedural hypothyroidism: Secondary | ICD-10-CM | POA: Diagnosis not present

## 2018-10-11 DIAGNOSIS — E0829 Diabetes mellitus due to underlying condition with other diabetic kidney complication: Secondary | ICD-10-CM

## 2018-10-11 HISTORY — PX: LEFT HEART CATH AND CORONARY ANGIOGRAPHY: CATH118249

## 2018-10-11 HISTORY — PX: CORONARY STENT INTERVENTION: CATH118234

## 2018-10-11 LAB — POCT ACTIVATED CLOTTING TIME
Activated Clotting Time: 263 seconds
Activated Clotting Time: 235 seconds

## 2018-10-11 LAB — GLUCOSE, CAPILLARY
Glucose-Capillary: 201 mg/dL — ABNORMAL HIGH (ref 70–99)
Glucose-Capillary: 181 mg/dL — ABNORMAL HIGH (ref 70–99)

## 2018-10-11 SURGERY — LEFT HEART CATH AND CORONARY ANGIOGRAPHY
Anesthesia: LOCAL

## 2018-10-11 MED ORDER — SODIUM CHLORIDE 0.9% FLUSH: 3.0000 mL | INTRAVENOUS | Status: DC | PRN

## 2018-10-11 MED ORDER — SODIUM CHLORIDE 0.9% FLUSH: 3.0000 mL | Freq: Two times a day (BID) | INTRAVENOUS | Status: DC

## 2018-10-11 MED ORDER — VERAPAMIL HCL 2.5 MG/ML IV SOLN
INTRAVENOUS | Status: DC | PRN
  Administered 2018-10-11: 10 mL via INTRA_ARTERIAL

## 2018-10-11 MED ORDER — SODIUM CHLORIDE 0.9 % IV SOLN: 250.0000 mL | INTRAVENOUS | Status: DC | PRN

## 2018-10-11 MED ORDER — CLOPIDOGREL BISULFATE 300 MG PO TABS
ORAL_TABLET | ORAL | Status: AC
  Filled 2018-10-11: qty 2

## 2018-10-11 MED ORDER — ACETAMINOPHEN 325 MG PO TABS: 650.0000 mg | ORAL_TABLET | ORAL | Status: DC | PRN

## 2018-10-11 MED ORDER — IOHEXOL 350 MG/ML SOLN
INTRAVENOUS | Status: DC | PRN
  Administered 2018-10-11: 110 mL via INTRA_ARTERIAL

## 2018-10-11 MED ORDER — CLOPIDOGREL BISULFATE 75 MG PO TABS: 75.0000 mg | ORAL_TABLET | Freq: Every day | ORAL | Status: DC

## 2018-10-11 MED ORDER — HEPARIN SODIUM (PORCINE) 1000 UNIT/ML IJ SOLN
INTRAMUSCULAR | Status: DC | PRN
  Administered 2018-10-11: 2000 [IU] via INTRAVENOUS
  Administered 2018-10-11 (×2): 4000 [IU] via INTRAVENOUS

## 2018-10-11 MED ORDER — MIDAZOLAM HCL 2 MG/2ML IJ SOLN
INTRAMUSCULAR | Status: DC | PRN
  Administered 2018-10-11: 1 mg via INTRAVENOUS

## 2018-10-11 MED ORDER — HEPARIN SODIUM (PORCINE) 1000 UNIT/ML IJ SOLN
INTRAMUSCULAR | Status: AC
  Filled 2018-10-11: qty 1

## 2018-10-11 MED ORDER — FENTANYL CITRATE (PF) 100 MCG/2ML IJ SOLN
INTRAMUSCULAR | Status: AC
  Filled 2018-10-11: qty 2

## 2018-10-11 MED ORDER — HEPARIN (PORCINE) IN NACL 1000-0.9 UT/500ML-% IV SOLN
INTRAVENOUS | Status: DC | PRN
  Administered 2018-10-11 (×2): 500 mL

## 2018-10-11 MED ORDER — NITROGLYCERIN 1 MG/10 ML FOR IR/CATH LAB
INTRA_ARTERIAL | Status: DC | PRN
  Administered 2018-10-11: 200 ug via INTRACORONARY

## 2018-10-11 MED ORDER — NITROGLYCERIN 1 MG/10 ML FOR IR/CATH LAB
INTRA_ARTERIAL | Status: AC
  Filled 2018-10-11: qty 10

## 2018-10-11 MED ORDER — SODIUM CHLORIDE 0.9 % WEIGHT BASED INFUSION: 1.0000 mL/kg/h | INTRAVENOUS | Status: DC

## 2018-10-11 MED ORDER — CLOPIDOGREL BISULFATE 75 MG PO TABS: 75.0000 mg | ORAL_TABLET | Freq: Every day | ORAL | 0 refills | Status: DC

## 2018-10-11 MED ORDER — SODIUM CHLORIDE 0.9 % WEIGHT BASED INFUSION
3.0000 mL/kg/h | INTRAVENOUS | Status: AC
  Administered 2018-10-11: 3 mL/kg/h via INTRAVENOUS

## 2018-10-11 MED ORDER — CLOPIDOGREL BISULFATE 300 MG PO TABS
ORAL_TABLET | ORAL | Status: AC
  Filled 2018-10-11: qty 1

## 2018-10-11 MED ORDER — ASPIRIN 81 MG PO CHEW: 81.0000 mg | CHEWABLE_TABLET | ORAL | Status: DC

## 2018-10-11 MED ORDER — LIDOCAINE HCL (PF) 1 % IJ SOLN
INTRAMUSCULAR | Status: AC
  Filled 2018-10-11: qty 30

## 2018-10-11 MED ORDER — ONDANSETRON HCL 4 MG/2ML IJ SOLN: 4.0000 mg | Freq: Four times a day (QID) | INTRAMUSCULAR | Status: DC | PRN

## 2018-10-11 MED ORDER — VERAPAMIL HCL 2.5 MG/ML IV SOLN
INTRAVENOUS | Status: AC
  Filled 2018-10-11: qty 2

## 2018-10-11 MED ORDER — CLOPIDOGREL BISULFATE 300 MG PO TABS
ORAL_TABLET | ORAL | Status: DC | PRN
  Administered 2018-10-11: 600 mg via ORAL

## 2018-10-11 MED ORDER — FENTANYL CITRATE (PF) 100 MCG/2ML IJ SOLN
INTRAMUSCULAR | Status: DC | PRN
  Administered 2018-10-11: 25 ug via INTRAVENOUS

## 2018-10-11 MED ORDER — MIDAZOLAM HCL 2 MG/2ML IJ SOLN
INTRAMUSCULAR | Status: AC
  Filled 2018-10-11: qty 2

## 2018-10-11 MED FILL — CLOPIDOGREL 75 MG TABLET: 75 | 30 days supply | Qty: 30 | Fill #0

## 2018-10-11 SURGICAL SUPPLY — 18 items
BALLN MINITREK OTW 2.0X12 (BALLOONS) ×2
BALLN SAPPHIRE ~~LOC~~ 3.0X18 (BALLOONS) ×1 IMPLANT
BALLOON MINITREK OTW 2.0X12 (BALLOONS) IMPLANT
CATH 5FR JL3.5 JR4 ANG PIG MP (CATHETERS) ×1 IMPLANT
CATH TRAPPER 6-8F (CATHETERS) ×1 IMPLANT
CATH VISTA GUIDE 6FR XBLAD3.5 (CATHETERS) ×1 IMPLANT
DEVICE RAD COMP TR BAND LRG (VASCULAR PRODUCTS) ×1 IMPLANT
GLIDESHEATH SLEND SS 6F .021 (SHEATH) ×1 IMPLANT
GUIDEWIRE INQWIRE 1.5J.035X260 (WIRE) IMPLANT
INQWIRE 1.5J .035X260CM (WIRE) ×2
KIT ENCORE 26 ADVANTAGE (KITS) ×1 IMPLANT
KIT HEART LEFT (KITS) ×2 IMPLANT
PACK CARDIAC CATHETERIZATION (CUSTOM PROCEDURE TRAY) ×2 IMPLANT
STENT SYNERGY DES 2.75X28 (Permanent Stent) ×1 IMPLANT
TRANSDUCER W/STOPCOCK (MISCELLANEOUS) ×2 IMPLANT
TUBING CIL FLEX 10 FLL-RA (TUBING) ×2 IMPLANT
WIRE ASAHI PROWATER 180CM (WIRE) ×1 IMPLANT
WIRE FIGHTER CROSSING 190CM (WIRE) IMPLANT

## 2018-10-11 NOTE — Progress Notes (Signed)
CARDIAC REHAB PHASE I   Pt and husband educated on importance of ASA and Plavix. Pt given stent card, heart healthy and diabetic diets. Reviewed restrictions and exercise guidelines. Will refer to CRP II [place], pt states she is uninterested in attending.  [id number] Rufina Falco, RN BSN 10/11/2018 11:58 AM

## 2018-10-11 NOTE — Interval H&P Note (Signed)
History and Physical Interval Note:  10/11/2018 6:57 AM  Alicia Medina  has presented today for surgery, with the diagnosis of abnormal proview.  The various methods of treatment have been discussed with the patient and family. After consideration of risks, benefits and other options for treatment, the patient has consented to  Procedure(s): LEFT HEART CATH AND CORONARY ANGIOGRAPHY (N/A) as a surgical intervention.  The patient's history has been reviewed, patient examined, no change in status, stable for surgery.  I have reviewed the patient's chart and labs.  Questions were answered to the patient's satisfaction.    Cath Lab Visit (complete for each Cath Lab visit)  Clinical Evaluation Leading to the Procedure:   ACS: No.  Non-ACS:    Anginal Classification: CCS II  Anti-ischemic medical therapy: Minimal Therapy (1 class of medications)  Non-Invasive Test Results: Intermediate-risk stress test findings: cardiac mortality 1-3%/year  Prior CABG: No previous CABG       Alicia Medina [facility] 10/11/2018 6:57 AM

## 2018-10-11 NOTE — H&P (Signed)
Cardiology Admission History and Physical:   Patient ID: Alicia Medina MRN: [id number]; DOB: [birth year]   Admission date: 10/11/2018  Primary Care Provider: Mellody Dance, DO Primary Cardiologist: Aidyn Kellis Martinique, MD  Primary Electrophysiologist:  None   Chief Complaint:  Chest pain  Patient Profile:   Alicia Medina is a 85 y.o. female with history of CAD presents with chest pain and abnormal Myoview  History of Present Illness:   [name] has a history of CAD. She  had an inferior MI treated with BMS to the RCA in February 2013. She required defibrillation multiple times and her hospitalization was complicated by cardiogenic shock, VF, VDRF and CHB requiring a temporary pacemaker. She had abrupt occlusion of the RCA stent that was treated with a 2nd BMS to the RCA.Followup nuclear stress testing in March 2013 showed normal perfusion throughout with ejection fraction of 76%. On recent follow up visit she noted intermittent chest pain radiating into her back and between her shoulder blades. This is random and not related to activity. To further evaluate she had a Lexiscan Myoview showing moderate perfusion defect in the inferoapex, lateral apex, and anteroapex that was new compared to 2013 and was an intermediate risk study. EF 63%. Because of these findings and chest pain cardiac cath was recommended.    Past Medical History:  Diagnosis Date  . Arthritis   . CAD (coronary artery disease) CARDIOLOGIST-  DR Martinique   08-28-2011 Inferior STEMI with BMS to the RCA, complicated by cardiogenic shock, VF, VDRF , temporary CHB and abrupt reocclusion of the RCA stent treated with a second BMS to the RCA. Has residual LAD disease. EF is 50%  . Diverticulosis of colon   . Hemorrhoids, internal, with bleeding   . Hyperlipidemia   . Hypertension   . Hypothyroidism, postsurgical   . S/p bare metal coronary artery stent    2013  . Type 2 diabetes mellitus ([place])   . Wears hearing aid    both  ears    Past Surgical History:  Procedure Laterality Date  . CARDIOVASCULAR STRESS TEST  10-03-2011  dr Martinique   normal perfusion / no ischemia/ ef 77%  . CARPAL TUNNEL RELEASE Right 01/25/2013   Procedure: CARPAL TUNNEL RELEASE;  Surgeon: Wynonia Sours, MD;  Location: [place];  Service: Orthopedics;  Laterality: Right;  . CARPAL TUNNEL RELEASE Left 10/25/2013   Procedure: LEFT CARPAL TUNNEL RELEASE;  Surgeon: Wynonia Sours, MD;  Location: [place];  Service: Orthopedics;  Laterality: Left;  . CATARACT EXTRACTION W/ INTRAOCULAR LENS  IMPLANT, BILATERAL  2007  . COLONOSCOPY  04-27-2003  . CORONARY ANGIOGRAM Left 08/28/2011   Procedure: CORONARY ANGIOGRAM;  Surgeon: Temeka Pore M Martinique, MD;  Location: [facility] CATH LAB;  Service: Cardiovascular;  Laterality: Left;  . CORONARY ANGIOPLASTY WITH STENT PLACEMENT  08-28-2011  dr Martinique   BMS to [facility] with abrupt reocclusion treated with second BMS/  LAD ostial 90%, proximal50-60%, first diagonal 80-90%  . LEFT HEART CATHETERIZATION WITH CORONARY ANGIOGRAM N/A 08/28/2011   Procedure: LEFT HEART CATHETERIZATION WITH CORONARY ANGIOGRAM;  Surgeon: Latria Mccarron M Martinique, MD;  Location: [facility] CATH LAB;  Service: Cardiovascular;  Laterality: N/A;  . PERCUTANEOUS CORONARY STENT INTERVENTION (PCI-S) Left 08/28/2011   Procedure: PERCUTANEOUS CORONARY STENT INTERVENTION (PCI-S);  Surgeon: Sabeen Piechocki M Martinique, MD;  Location: [facility] CATH LAB;  Service: Cardiovascular;  Laterality: Left;  . TOTAL THYROIDECTOMY Bilateral 05-02-2007   multinodular goiter  . TRANSANAL HEMORRHOIDAL DEARTERIALIZATION N/A 03/28/2014  Procedure: TRANSANAL HEMORRHOIDAL DEARTERIALIZATION;  Surgeon: Leighton Ruff, MD;  Location: [facility];  Service: General;  Laterality: N/A;  . TRANSTHORACIC ECHOCARDIOGRAM  08-30-2011   mild LVH/ ef 93-81%/  grade I diastolic dysfunction  . TRIGGER FINGER RELEASE Right 01/25/2013   Procedure: RELEASE TRIGGER FINGER/A-1 PULLEY RIGHT INDEX FINGER;   Surgeon: Wynonia Sours, MD;  Location: [place];  Service: Orthopedics;  Laterality: Right;  . TRIGGER FINGER RELEASE Left 10/25/2013   Procedure: RELEASE A-1 PULLEY LEFT MIDDLE FINGER;  Surgeon: Wynonia Sours, MD;  Location: [place];  Service: Orthopedics;  Laterality: Left;  . TRIGGER FINGER RELEASE Left 12/04/2014   Procedure: RELEASE TRIGGER FINGER/A-1 PULLEY LEFT RING FINGER;  Surgeon: Daryll Brod, MD;  Location: [place];  Service: Orthopedics;  Laterality: Left;  . TRIGGER FINGER RELEASE Right 03/30/2017   Procedure: RELEASE TRIGGER FINGER/A-1 PULLEY RIGHT RING FINGER AND RIGHT SMALL FINGER;  Surgeon: Daryll Brod, MD;  Location: [place];  Service: Orthopedics;  Laterality: Right;     Medications Prior to Admission: Prior to Admission medications   Medication Sig Start Date End Date Taking? Authorizing Provider  acetaminophen (TYLENOL) 500 MG tablet Take 1,000 mg by mouth at bedtime.   Yes [provider]  Ascorbic Acid (VITAMIN C) 1000 MG tablet Take 1,000 mg by mouth every evening.   Yes [provider]  aspirin EC 81 MG tablet Take 81 mg by mouth daily.   Yes [provider]  Biotin w/ Vitamins C & E (HAIR/SKIN/NAILS PO) Take 1 tablet by mouth 2 (two) times daily.   Yes [provider]  bismuth subsalicylate (PEPTO BISMOL) 262 MG chewable tablet Chew 131-262 mg by mouth 4 (four) times daily as needed for diarrhea or loose stools.   Yes [provider]  calcium carbonate (CALCIUM 600) 1500 (600 Ca) MG TABS tablet Take 600 mg of elemental calcium by mouth once a week.    Yes [provider]  Cholecalciferol (VITAMIN D-3) 125 MCG (5000 UT) TABS Take 5,000 Units by mouth daily.   Yes [provider]  Chromium Picolinate 1000 MCG TABS Take 1,000 mcg by mouth daily.   Yes [provider]  Coenzyme Q10 200 MG TABS Take 200 mg by mouth daily.    Yes [provider]  COLLAGEN PO Take 1,000 mg by mouth 2 (two) times daily.   Yes [provider]  folic acid (FOLVITE) 829 MCG tablet Take 800 mcg by mouth daily.   Yes [provider]  Ginkgo Biloba 60 MG TABS Take 60 mg by mouth at bedtime.   Yes [provider]  Insulin Detemir (LEVEMIR FLEXTOUCH) 100 UNIT/ML Pen Inject 45 Units into the skin every morning. And pen needles 1/day 08/16/18  Yes Renato Shin, MD  loperamide (IMODIUM A-D) 2 MG tablet Take 1-2 mg by mouth 4 (four) times daily as needed for diarrhea or loose stools.   Yes [provider]  loratadine (CLARITIN) 10 MG tablet Take 10 mg by mouth at bedtime.   Yes [provider]  losartan (COZAAR) 25 MG tablet Take 1 tablet (25 mg total) by mouth every morning. 05/02/18  Yes Opalski, Neoma Laming, DO  Magnesium 250 MG TABS Take 250 mg by mouth daily.   Yes [provider]  metoprolol tartrate (LOPRESSOR) 50 MG tablet TAKE 1 TABLET BY MOUTH 2 TIMES DAILY. Patient taking differently: Take 50 mg by mouth 2 (two) times daily.  01/04/18  Yes Martinique, Hymen Arnett M, MD  Multiple Vitamin (MULTIVITAMIN) tablet Take 1 tablet by mouth once a week.    Yes [provider]  potassium chloride SA (K-DUR,KLOR-CON) 20 MEQ tablet TAKE 1 TABLET BY MOUTH DAILY. Patient taking differently: Take 20 mEq by mouth every evening.  07/28/18  Yes Martinique, Tangia Pinard M, MD  Psyllium (METAMUCIL PO) Take 1 capsule by mouth daily.   Yes [provider]  rosuvastatin (CRESTOR) 20 MG tablet TAKE 1 TABLET BY MOUTH DAILY. Patient taking differently: Take 20 mg by mouth every evening.  01/04/18  Yes Martinique, Shaiden Aldous M, MD  SYNTHROID 150 MCG tablet TAKE 1 TABLET BY MOUTH DAILY BEFORE BREAKFAST. Patient taking differently: Take 150 mcg by mouth daily before breakfast.  07/28/18  Yes Opalski, Deborah, DO  vitamin B-12 (CYANOCOBALAMIN) 500 MCG tablet Take 500 mcg by mouth daily.   Yes [provider]  VITAMIN E PO Take 500 mg  by mouth 2 (two) times a week.    Yes [provider]  glucose blood (TRUE METRIX BLOOD GLUCOSE TEST) test strip 1 each by Other route 2 (two) times daily. And lancets 2/day 06/20/18   Renato Shin, MD     Allergies:    Allergies  Allergen Reactions  . Demeclocycline Other (See Comments)    Severe stomach cramps Severe stomach cramps Severe stomach cramps  . Lisinopril Cough  . Tetracyclines & Related Other (See Comments)    Severe stomach cramps    Social History:   Social History   Socioeconomic History  . Marital status: Married    Spouse name: Not on file  . Number of children: Not on file  . Years of education: Not on file  . Highest education level: Not on file  Occupational History  . Not on file  Social Needs  . Financial resource strain: Not on file  . Food insecurity:    Worry: Not on file    Inability: Not on file  . Transportation needs:    Medical: Not on file    Non-medical: Not on file  Tobacco Use  . Smoking status: Never Smoker  . Smokeless tobacco: Never Used  Substance and Sexual Activity  . Alcohol use: Yes    Alcohol/week: 1.0 standard drinks    Types: 1 Glasses of wine per week    Comment: social  . Drug use: No  . Sexual activity: Not on file  Lifestyle  . Physical activity:    Days per week: Not on file    Minutes per session: Not on file  . Stress: Not on file  Relationships  . Social connections:    Talks on phone: Not on file    Gets together: Not on file    Attends religious service: Not on file    Active member of club or organization: Not on file    Attends meetings of clubs or organizations: Not on file    Relationship status: Not on file  . Intimate partner violence:    Fear of current or ex partner: Not on file    Emotionally abused: Not on file    Physically abused: Not on file    Forced sexual activity: Not on file  Other Topics Concern  . Not on file  Social History Narrative   Lives with husband.   Has 2  sons- both healthy.   Younger son lives in [place], [place] in order son lives in [place].    Family History:  The patient's family history includes Cancer in her father and mother; Stroke in her father and mother. There is no history of Diabetes.    ROS:  Please see the history of present illness.  All other ROS reviewed and negative.     Physical Exam/Data:   Vitals:   10/11/18 0541  BP: (!) 173/90  Pulse: 87  Temp: 98.1 F (36.7 C)  TempSrc: Oral  SpO2: 99%  Weight: 77.1 kg  Height: 5\' 4"  (1.626 m)   No intake or output data in the 24 hours ending 10/11/18 0646 Last 3 Weights 10/11/2018 09/22/2018 09/05/2018  Weight (lbs) 170 lb 171 lb 171 lb  Weight (kg) 77.111 kg 77.565 kg 77.565 kg     Body mass index is 29.18 kg/m.  General:  Well nourished, well developed, in no acute distress HEENT: normal Lymph: no adenopathy Neck: no JVD Endocrine:  No thryomegaly Vascular: No carotid bruits; FA pulses 2+ bilaterally without bruits  Cardiac:  normal S1, S2; RRR; no murmur  Lungs:  clear to auscultation bilaterally, no wheezing, rhonchi or rales  Abd: soft, nontender, no hepatomegaly  Ext: no edema Musculoskeletal:  No deformities, BUE and BLE strength normal and equal Skin: warm and dry  Neuro:  CNs 2-12 intact, no focal abnormalities noted Psych:  Normal affect    EKG:  The ECG that was done today was personally reviewed and demonstrates NSR with low voltage. Otherwise normal.    Laboratory Data:  Chemistry Recent Labs  Lab 10/05/18 1139  NA 140  K 4.8  CL 103  CO2 22  GLUCOSE 192*  BUN 18  CREATININE 1.10*  CALCIUM 9.3  GFRNONAA 47*  GFRAA 54*    No results for input(s): PROT, ALBUMIN, AST, ALT, ALKPHOS, BILITOT in the last 168 hours. Hematology Recent Labs  Lab 10/05/18 1139  WBC 5.6  RBC 4.11  HGB 12.1  HCT 36.4  MCV 89  MCH 29.4  MCHC 33.2  RDW 14.0  PLT 267   Cardiac EnzymesNo results for input(s): TROPONINI in the last 168 hours. No results  for input(s): TROPIPOC in the last 168 hours.  BNPNo results for input(s): BNP, PROBNP in the last 168 hours.  DDimer No results for input(s): DDIMER in the last 168 hours.  Radiology/Studies:  No results found.   Relevant CV Studies:  Lexiscan Myoview 09/22/18:Study Highlights     The left ventricular ejection fraction is normal (55-65%).  Nuclear stress EF: 63%.  There was no ST segment deviation noted during stress.  Defect 1: There is a medium size severe perfusion defect present in the apical anterior, apical inferior, apical lateral and apex location.  Defect 2: There is a small defect of severe severity present in the mid anterior location.  Findings consistent with prior myocardial infarction.  This is an intermediate risk study.   Medium size severe perfusion defect in the inferoapex, lateral apex and anteroapex, fixed without evidence of ischemia.  Small fixed perfusion defect in the anterior wall at the mid ventricle.  Evidence of infarction without evidence of ischemia.      Assessment and Plan:   1. CAD with angina pectoris. Intermediate risk lexiscan Myoview study. On metoprolol, ASA, statin. Will proceed with cardiac cath. The procedure and risks were reviewed including but not limited to death, myocardial infarction, stroke, arrythmias, bleeding, transfusion, emergency surgery, dye allergy, or renal dysfunction. The patient voices understanding and is agreeable to proceed..  2. HTN controlled. 3. Hypercholesterolemia. On statin. LDL 73.  4. DM type 2  On insulin. Followed by endocrinology.      For questions or updates, please contact [place] Please consult [email or web address] for contact info under        Signed, Wendi Lastra Martinique, MD  10/11/2018 6:46 AM

## 2018-10-11 NOTE — Discharge Instructions (Signed)
Radial Site Care ° °This sheet gives you information about how to care for yourself after your procedure. Your health care provider may also give you more specific instructions. If you have problems or questions, contact your health care provider. °What can I expect after the procedure? °After the procedure, it is common to have: °· Bruising and tenderness at the catheter insertion area. °Follow these instructions at home: °Medicines °· Take over-the-counter and prescription medicines only as told by your health care provider. °Insertion site care °· Follow instructions from your health care provider about how to take care of your insertion site. Make sure you: °? Wash your hands with soap and water before you change your bandage (dressing). If soap and water are not available, use hand sanitizer. °? Change your dressing as told by your health care provider. °? Leave stitches (sutures), skin glue, or adhesive strips in place. These skin closures may need to stay in place for 2 weeks or longer. If adhesive strip edges start to loosen and curl up, you may trim the loose edges. Do not remove adhesive strips completely unless your health care provider tells you to do that. °· Check your insertion site every day for signs of infection. Check for: °? Redness, swelling, or pain. °? Fluid or blood. °? Pus or a bad smell. °? Warmth. °· Do not take baths, swim, or use a hot tub until your health care provider approves. °· You may shower 24-48 hours after the procedure, or as directed by your health care provider. °? Remove the dressing and gently wash the site with plain soap and water. °? Pat the area dry with a clean towel. °? Do not rub the site. That could cause bleeding. °· Do not apply powder or lotion to the site. °Activity ° °· For 24 hours after the procedure, or as directed by your health care provider: °? Do not flex or bend the affected arm. °? Do not push or pull heavy objects with the affected arm. °? Do not  drive yourself home from the hospital or clinic. You may drive 24 hours after the procedure unless your health care provider tells you not to. °? Do not operate machinery or power tools. °· Do not lift anything that is heavier than 10 lb (4.5 kg), or the limit that you are told, until your health care provider says that it is safe. °· Ask your health care provider when it is okay to: °? Return to work or school. °? Resume usual physical activities or sports. °? Resume sexual activity. °General instructions °· If the catheter site starts to bleed, raise your arm and put firm pressure on the site. If the bleeding does not stop, get help right away. This is a medical emergency. °· If you went home on the same day as your procedure, a responsible adult should be with you for the first 24 hours after you arrive home. °· Keep all follow-up visits as told by your health care provider. This is important. °Contact a health care provider if: °· You have a fever. °· You have redness, swelling, or yellow drainage around your insertion site. °Get help right away if: °· You have unusual pain at the radial site. °· The catheter insertion area swells very fast. °· The insertion area is bleeding, and the bleeding does not stop when you hold steady pressure on the area. °· Your arm or hand becomes pale, cool, tingly, or numb. °These symptoms may represent a serious problem   that is an emergency. Do not wait to see if the symptoms will go away. Get medical help right away. Call your local emergency services (911 in the [place] Do not drive yourself to the hospital. °Summary °· After the procedure, it is common to have bruising and tenderness at the site. °· Follow instructions from your health care provider about how to take care of your radial site wound. Check the wound every day for signs of infection. °· Do not lift anything that is heavier than 10 lb (4.5 kg), or the limit that you are told, until your health care provider says  that it is safe. °This information is not intended to replace advice given to you by your health care provider. Make sure you discuss any questions you have with your health care provider. °Document Released: 08/15/2010 Document Revised: 08/18/2017 Document Reviewed: 08/18/2017 °Elsevier Interactive Patient Education © 2019 [employer] ° °

## 2018-10-11 NOTE — Discharge Summary (Signed)
Discharge Summary/SAME DAY PCI    Patient ID: Alicia Medina,  MRN: [id number], DOB/AGE: [birth year] 83 y.o.  Admit date: 10/11/2018 Discharge date: 10/11/2018  Primary Care Provider: Mellody Dance Primary Cardiologist: Dr. Martinique  Discharge Diagnoses    Principal Problem:   Angina pectoris [facility]) Active Problems:   Old inferior wall myocardial infarction   Hyperlipidemia associated with type 2 diabetes mellitus ([place])   Hypertension associated with diabetes ([place])   Diabetes mellitus due to underlying condition with microalbuminuria, with long-term current use of insulin (HCC)   Abnormal nuclear stress test  Allergies Allergies  Allergen Reactions  . Demeclocycline Other (See Comments)    Severe stomach cramps Severe stomach cramps Severe stomach cramps  . Lisinopril Cough  . Tetracyclines & Related Other (See Comments)    Severe stomach cramps    Diagnostic Studies/Procedures    Cath: 10/11/2018   Prox LAD to Mid LAD lesion is 100% stenosed.  Previously placed Mid RCA stent (unknown type) is widely patent.  Post intervention, there is a 0% residual stenosis.  A drug-eluting stent was successfully placed using a STENT SYNERGY DES [id number].  LV end diastolic pressure is normal.   1. Single vessel occlusive CAD with CTO of the mid LAD 2. Continued patency of stents in the RCA 3. Normal LVEDP 4. Successful CTO PCI of the mid LAD with DES x 1  Plan: DAPT for one year. Patient is a candidate for same day discharge.  _____________   History of Present Illness     Alicia Medina was seen in the office for follow up CAD on 09/05/2018 with Dr. Martinique. She has had an inferior MI treated with BMS to the RCA in February 2013. She required defibrillation multiple times and her hospitalization was complicated by cardiogenic shock, VF, VDRF and CHB requiring a temporary pacemaker. She had abrupt occlusion of the RCA stent that was treated with a 2nd BMS to the RCA. Followup  nuclear stress testing in March 2013 showed normal perfusion throughout with ejection fraction of 76%.  In September 2018 she underwent tendon release on her right ring and small finger.   Reported doing ok at her last office visit. She did note some intermittent chest discomfort radiating into her back between her shoulder blades. May occur anytime and are not related to activity. Noted sugars have been difficult to control and insulin dose increased. She stated she sometimes is forgetful about taking insulin. Her husband is being considered for TAVR. Given her symptoms she was recommended to undergo lexiscan myoview. Study was abnormal with new anterior and apical infarct. Recommended outpatient cardiac cath.   Hospital Course     Underwent cardiac cath noted above with Dr. Martinique with PCI/DESx1 to CTO of the mLAD. Plan for DAPT with ASA/plavix for at least one year. Seen by cardiac rehab while in short stay. No complications post cath. Radial site stable . Instructions/precautions regarding cath site care given prior to discharge.    Alicia Medina was seen by Dr. Martinique and determined stable for discharge home. Follow up in the office has been arranged. Medications are listed below.   _____________  Discharge Vitals Blood pressure (!) 149/54, pulse (!) 104, temperature 98 F (36.7 C), temperature source Oral, resp. rate 18, height 5\' 4"  (1.626 m), weight 77.1 kg, SpO2 96 %.  Filed Weights   10/11/18 0541  Weight: 77.1 kg    Labs & Radiologic Studies    CBC No results for input(s):  WBC, NEUTROABS, HGB, HCT, MCV, PLT in the last 72 hours. Basic Metabolic Panel No results for input(s): NA, K, CL, CO2, GLUCOSE, BUN, CREATININE, CALCIUM, MG, PHOS in the last 72 hours. Liver Function Tests No results for input(s): AST, ALT, ALKPHOS, BILITOT, PROT, ALBUMIN in the last 72 hours. No results for input(s): LIPASE, AMYLASE in the last 72 hours. Cardiac Enzymes No results for input(s):  CKTOTAL, CKMB, CKMBINDEX, TROPONINI in the last 72 hours. BNP Invalid input(s): POCBNP D-Dimer No results for input(s): DDIMER in the last 72 hours. Hemoglobin A1C No results for input(s): HGBA1C in the last 72 hours. Fasting Lipid Panel No results for input(s): CHOL, HDL, LDLCALC, TRIG, CHOLHDL, LDLDIRECT in the last 72 hours. Thyroid Function Tests No results for input(s): TSH, T4TOTAL, T3FREE, THYROIDAB in the last 72 hours.  Invalid input(s): FREET3 _____________  No results found. Disposition   Pt is being discharged home today in good condition.  Follow-up Plans & Appointments    Follow-up Information    Martinique, Peter M, MD Follow up on 10/26/2018.   Specialty:  Cardiology Why:  at 11:40am for your follow up appt.  Contact information: [address] [place] [address] [phone]          Discharge Instructions    Amb Referral to Cardiac Rehabilitation   Complete by:  As directed    Diagnosis:  Coronary Stents       Discharge Medications     Medication List    STOP taking these medications   Ginkgo Biloba 60 MG Tabs     TAKE these medications   acetaminophen 500 MG tablet Commonly known as:  TYLENOL Take 1,000 mg by mouth at bedtime.   aspirin EC 81 MG tablet Take 81 mg by mouth daily.   bismuth subsalicylate 540 MG chewable tablet Commonly known as:  PEPTO BISMOL Chew 131-262 mg by mouth 4 (four) times daily as needed for diarrhea or loose stools.   Calcium 600 1500 (600 Ca) MG Tabs tablet Generic drug:  calcium carbonate Take 600 mg of elemental calcium by mouth once a week.   Chromium Picolinate 1000 MCG Tabs Take 1,000 mcg by mouth daily.   clopidogrel 75 MG tablet Commonly known as:  Plavix Take 1 tablet (75 mg total) by mouth daily.   Coenzyme Q10 200 MG Tabs Take 200 mg by mouth daily.   COLLAGEN PO Take 1,000 mg by mouth 2 (two) times daily.   folic acid 981 MCG tablet Commonly known as:  FOLVITE Take 800  mcg by mouth daily.   glucose blood test strip Commonly known as:  True Metrix Blood Glucose Test 1 each by Other route 2 (two) times daily. And lancets 2/day   HAIR/SKIN/NAILS PO Take 1 tablet by mouth 2 (two) times daily.   Insulin Detemir 100 UNIT/ML Pen Commonly known as:  Levemir FlexTouch Inject 45 Units into the skin every morning. And pen needles 1/day   loperamide 2 MG tablet Commonly known as:  IMODIUM A-D Take 1-2 mg by mouth 4 (four) times daily as needed for diarrhea or loose stools.   loratadine 10 MG tablet Commonly known as:  CLARITIN Take 10 mg by mouth at bedtime.   losartan 25 MG tablet Commonly known as:  COZAAR Take 1 tablet (25 mg total) by mouth every morning.   Magnesium 250 MG Tabs Take 250 mg by mouth daily.   METAMUCIL PO Take 1 capsule by mouth daily.   metoprolol tartrate 50 MG  tablet Commonly known as:  LOPRESSOR TAKE 1 TABLET BY MOUTH 2 TIMES DAILY.   multivitamin tablet Take 1 tablet by mouth once a week.   potassium chloride SA 20 MEQ tablet Commonly known as:  K-DUR,KLOR-CON TAKE 1 TABLET BY MOUTH DAILY. What changed:  when to take this   rosuvastatin 20 MG tablet Commonly known as:  CRESTOR TAKE 1 TABLET BY MOUTH DAILY. What changed:  when to take this   Synthroid 150 MCG tablet Generic drug:  levothyroxine TAKE 1 TABLET BY MOUTH DAILY BEFORE BREAKFAST. What changed:  See the new instructions.   vitamin B-12 500 MCG tablet Commonly known as:  CYANOCOBALAMIN Take 500 mcg by mouth daily.   vitamin C 1000 MG tablet Take 1,000 mg by mouth every evening.   Vitamin D-3 125 MCG (5000 UT) Tabs Take 5,000 Units by mouth daily.   VITAMIN E PO Take 500 mg by mouth 2 (two) times a week.        Acute coronary syndrome (MI, NSTEMI, STEMI, etc) this admission?: No.     Outstanding Labs/Studies   N/a   Duration of Discharge Encounter   Greater than 30 minutes including physician time.  Signed, Reino Bellis NP-C  10/11/2018, 1:10 PM

## 2018-10-12 ENCOUNTER — Telehealth (HOSPITAL_COMMUNITY): Payer: Self-pay

## 2018-10-12 ENCOUNTER — Encounter (HOSPITAL_COMMUNITY): Payer: Self-pay | Admitting: Cardiology

## 2018-10-12 MED FILL — Lidocaine HCl Local Preservative Free (PF) Inj 1%: INTRAMUSCULAR | Qty: 30 | Status: AC

## 2018-10-12 NOTE — Telephone Encounter (Signed)
Per phase 1 "dont call pt" Alicia Medina. Support Rep II

## 2018-10-18 ENCOUNTER — Other Ambulatory Visit: Payer: Self-pay

## 2018-10-18 ENCOUNTER — Ambulatory Visit (INDEPENDENT_AMBULATORY_CARE_PROVIDER_SITE_OTHER): Payer: Medicare Other | Admitting: Endocrinology

## 2018-10-18 ENCOUNTER — Encounter: Payer: Self-pay | Admitting: Endocrinology

## 2018-10-18 VITALS — BP 124/78 | HR 74 | Temp 97.9°F | Resp 14 | Ht 64.0 in | Wt 170.0 lb

## 2018-10-18 DIAGNOSIS — Z794 Long term (current) use of insulin: Secondary | ICD-10-CM | POA: Diagnosis not present

## 2018-10-18 DIAGNOSIS — I209 Angina pectoris, unspecified: Secondary | ICD-10-CM

## 2018-10-18 DIAGNOSIS — R809 Proteinuria, unspecified: Secondary | ICD-10-CM

## 2018-10-18 DIAGNOSIS — E0829 Diabetes mellitus due to underlying condition with other diabetic kidney complication: Secondary | ICD-10-CM

## 2018-10-18 MED ORDER — INSULIN DETEMIR 100 UNIT/ML FLEXPEN: 50.0000 [IU] | PEN_INJECTOR | SUBCUTANEOUS | 11 refills | Status: DC

## 2018-10-18 NOTE — Patient Instructions (Addendum)
check your blood sugar twice a day.  vary the time of day when you check, between before the 3 meals, and at bedtime.  also check if you have symptoms of your blood sugar being too high or too low.  please keep a record of the readings and bring it to your next appointment here (or you can bring the meter itself).  You can write it on any piece of paper.  please call us sooner if your blood sugar goes below 70, or if you have a lot of readings over 200. Please increase the levemir to 50 each morning.   On this type of insulin schedule, you should eat meals on a regular schedule.  If a meal is missed or significantly delayed, your blood sugar could go low.   Please come back for a follow-up appointment in 2 months.

## 2018-10-18 NOTE — Progress Notes (Signed)
Subjective:    Patient ID: Alicia Medina, female    DOB: [birth year], 85 y.o.   MRN: [id number]  HPI Pt returns for f/u of diabetes mellitus: DM type: Insulin-requiring type 2 Dx'ed: 0867 Complications: CAD and renal insuff Therapy: insulin since 2013 GDM: never DKA: never Severe hypoglycemia: never Pancreatitis: never Pancreatic imaging: never Other: she declines multiple daily injections; She did not tolerate metformin (diarrhea); she changed lantus to levemir, based on the pattern of cbg's.   Interval history: she brings a record of her cbg's which I have reviewed today.  She checks fasting only.  It varies from 92-238.  She says she has not recently missed the insulin.  pt states she feels well in general.  Past Medical History:  Diagnosis Date  . Arthritis   . CAD (coronary artery disease) CARDIOLOGIST-  DR Martinique   08-28-2011 Inferior STEMI with BMS to the RCA, complicated by cardiogenic shock, VF, VDRF , temporary CHB and abrupt reocclusion of the RCA stent treated with a second BMS to the RCA. Has residual LAD disease. EF is 50%  . Diverticulosis of colon   . Hemorrhoids, internal, with bleeding   . Hyperlipidemia   . Hypertension   . Hypothyroidism, postsurgical   . S/p bare metal coronary artery stent    2013  . Type 2 diabetes mellitus ([place])   . Wears hearing aid    both ears    Past Surgical History:  Procedure Laterality Date  . CARDIOVASCULAR STRESS TEST  10-03-2011  dr Martinique   normal perfusion / no ischemia/ ef 77%  . CARPAL TUNNEL RELEASE Right 01/25/2013   Procedure: CARPAL TUNNEL RELEASE;  Surgeon: Wynonia Sours, MD;  Location: [facility];  Service: Orthopedics;  Laterality: Right;  . CARPAL TUNNEL RELEASE Left 10/25/2013   Procedure: LEFT CARPAL TUNNEL RELEASE;  Surgeon: Wynonia Sours, MD;  Location: [facility];  Service: Orthopedics;  Laterality: Left;  . CATARACT EXTRACTION W/ INTRAOCULAR LENS  IMPLANT, BILATERAL  2007  .  COLONOSCOPY  04-27-2003  . CORONARY ANGIOGRAM Left 08/28/2011   Procedure: CORONARY ANGIOGRAM;  Surgeon: Peter M Martinique, MD;  Location: [facility] CATH LAB;  Service: Cardiovascular;  Laterality: Left;  . CORONARY ANGIOPLASTY WITH STENT PLACEMENT  08-28-2011  dr Martinique   BMS to [facility] with abrupt reocclusion treated with second BMS/  LAD ostial 90%, proximal50-60%, first diagonal 80-90%  . CORONARY STENT INTERVENTION N/A 10/11/2018   Procedure: CORONARY STENT INTERVENTION;  Surgeon: Martinique, Peter M, MD;  Location: [facility] CV LAB;  Service: Cardiovascular;  Laterality: N/A;  . LEFT HEART CATH AND CORONARY ANGIOGRAPHY N/A 10/11/2018   Procedure: LEFT HEART CATH AND CORONARY ANGIOGRAPHY;  Surgeon: Martinique, Peter M, MD;  Location: [facility] CV LAB;  Service: Cardiovascular;  Laterality: N/A;  . LEFT HEART CATHETERIZATION WITH CORONARY ANGIOGRAM N/A 08/28/2011   Procedure: LEFT HEART CATHETERIZATION WITH CORONARY ANGIOGRAM;  Surgeon: Peter M Martinique, MD;  Location: [facility] CATH LAB;  Service: Cardiovascular;  Laterality: N/A;  . PERCUTANEOUS CORONARY STENT INTERVENTION (PCI-S) Left 08/28/2011   Procedure: PERCUTANEOUS CORONARY STENT INTERVENTION (PCI-S);  Surgeon: Peter M Martinique, MD;  Location: [facility] CATH LAB;  Service: Cardiovascular;  Laterality: Left;  . TOTAL THYROIDECTOMY Bilateral 05-02-2007   multinodular goiter  . TRANSANAL HEMORRHOIDAL DEARTERIALIZATION N/A 03/28/2014   Procedure: TRANSANAL HEMORRHOIDAL DEARTERIALIZATION;  Surgeon: Leighton Ruff, MD;  Location: [facility];  Service: General;  Laterality: N/A;  . TRANSTHORACIC ECHOCARDIOGRAM  08-30-2011  mild LVH/ ef 71-69%/  grade I diastolic dysfunction  . TRIGGER FINGER RELEASE Right 01/25/2013   Procedure: RELEASE TRIGGER FINGER/A-1 PULLEY RIGHT INDEX FINGER;  Surgeon: Wynonia Sours, MD;  Location: [facility];  Service: Orthopedics;  Laterality: Right;  . TRIGGER FINGER RELEASE Left 10/25/2013   Procedure: RELEASE A-1 PULLEY LEFT  MIDDLE FINGER;  Surgeon: Wynonia Sours, MD;  Location: [facility];  Service: Orthopedics;  Laterality: Left;  . TRIGGER FINGER RELEASE Left 12/04/2014   Procedure: RELEASE TRIGGER FINGER/A-1 PULLEY LEFT RING FINGER;  Surgeon: Daryll Brod, MD;  Location: [facility];  Service: Orthopedics;  Laterality: Left;  . TRIGGER FINGER RELEASE Right 03/30/2017   Procedure: RELEASE TRIGGER FINGER/A-1 PULLEY RIGHT RING FINGER AND RIGHT SMALL FINGER;  Surgeon: Daryll Brod, MD;  Location: [facility];  Service: Orthopedics;  Laterality: Right;    Social History   Socioeconomic History  . Marital status: Married    Spouse name: Not on file  . Number of children: Not on file  . Years of education: Not on file  . Highest education level: Not on file  Occupational History  . Not on file  Social Needs  . Financial resource strain: Not on file  . Food insecurity:    Worry: Not on file    Inability: Not on file  . Transportation needs:    Medical: Not on file    Non-medical: Not on file  Tobacco Use  . Smoking status: Never Smoker  . Smokeless tobacco: Never Used  Substance and Sexual Activity  . Alcohol use: Yes    Alcohol/week: 1.0 standard drinks    Types: 1 Glasses of wine per week    Comment: social  . Drug use: No  . Sexual activity: Not on file  Lifestyle  . Physical activity:    Days per week: Not on file    Minutes per session: Not on file  . Stress: Not on file  Relationships  . Social connections:    Talks on phone: Not on file    Gets together: Not on file    Attends religious service: Not on file    Active member of club or organization: Not on file    Attends meetings of clubs or organizations: Not on file    Relationship status: Not on file  . Intimate partner violence:    Fear of current or ex partner: Not on file    Emotionally abused: Not on file    Physically abused: Not on file    Forced sexual activity: Not on file  Other  Topics Concern  . Not on file  Social History Narrative   Lives with husband.   Has 2 sons- both healthy.   Younger son lives in [place], [place] in order son lives in [place].    Current Outpatient Medications on File Prior to Visit  Medication Sig Dispense Refill  . acetaminophen (TYLENOL) 500 MG tablet Take 1,000 mg by mouth at bedtime.    . Ascorbic Acid (VITAMIN C) 1000 MG tablet Take 1,000 mg by mouth every evening.    Marland Kitchen aspirin EC 81 MG tablet Take 81 mg by mouth daily.    . Biotin w/ Vitamins C & E (HAIR/SKIN/NAILS PO) Take 1 tablet by mouth 2 (two) times daily.    Marland Kitchen bismuth subsalicylate (PEPTO BISMOL) 262 MG chewable tablet Chew 131-262 mg by mouth 4 (four) times daily as needed for diarrhea or loose stools.    Marland Kitchen  calcium carbonate (CALCIUM 600) 1500 (600 Ca) MG TABS tablet Take 600 mg of elemental calcium by mouth once a week.     . Cholecalciferol (VITAMIN D-3) 125 MCG (5000 UT) TABS Take 5,000 Units by mouth daily.    . Chromium Picolinate 1000 MCG TABS Take 1,000 mcg by mouth daily.    . clopidogrel (PLAVIX) 75 MG tablet Take 1 tablet (75 mg total) by mouth daily. 30 tablet 0  . Coenzyme Q10 200 MG TABS Take 200 mg by mouth daily.     . COLLAGEN PO Take 1,000 mg by mouth 2 (two) times daily.    . folic acid (FOLVITE) 268 MCG tablet Take 800 mcg by mouth daily.    Marland Kitchen glucose blood (TRUE METRIX BLOOD GLUCOSE TEST) test strip 1 each by Other route 2 (two) times daily. And lancets 2/day 200 each 3  . loperamide (IMODIUM A-D) 2 MG tablet Take 1-2 mg by mouth 4 (four) times daily as needed for diarrhea or loose stools.    Marland Kitchen loratadine (CLARITIN) 10 MG tablet Take 10 mg by mouth at bedtime.    Marland Kitchen losartan (COZAAR) 25 MG tablet Take 1 tablet (25 mg total) by mouth every morning. 90 tablet 3  . Magnesium 250 MG TABS Take 250 mg by mouth daily.    . metoprolol tartrate (LOPRESSOR) 50 MG tablet TAKE 1 TABLET BY MOUTH 2 TIMES DAILY. (Patient taking differently: Take 50 mg by mouth 2 (two)  times daily. ) 180 tablet 2  . Multiple Vitamin (MULTIVITAMIN) tablet Take 1 tablet by mouth once a week.     . potassium chloride SA (K-DUR,KLOR-CON) 20 MEQ tablet TAKE 1 TABLET BY MOUTH DAILY. (Patient taking differently: Take 20 mEq by mouth every evening. ) 90 tablet 0  . Psyllium (METAMUCIL PO) Take 1 capsule by mouth daily.    . rosuvastatin (CRESTOR) 20 MG tablet TAKE 1 TABLET BY MOUTH DAILY. (Patient taking differently: Take 20 mg by mouth every evening. ) 90 tablet 2  . SYNTHROID 150 MCG tablet TAKE 1 TABLET BY MOUTH DAILY BEFORE BREAKFAST. (Patient taking differently: Take 150 mcg by mouth daily before breakfast. ) 90 tablet 1  . vitamin B-12 (CYANOCOBALAMIN) 500 MCG tablet Take 500 mcg by mouth daily.    Marland Kitchen VITAMIN E PO Take 500 mg by mouth 2 (two) times a week.      No current facility-administered medications on file prior to visit.     Allergies  Allergen Reactions  . Demeclocycline Other (See Comments)    Severe stomach cramps Severe stomach cramps Severe stomach cramps  . Lisinopril Cough  . Tetracyclines & Related Other (See Comments)    Severe stomach cramps    Family History  Problem Relation Age of Onset  . Stroke Mother   . Cancer Mother        breast  . Cancer Father        lung  . Stroke Father   . Diabetes Neg Hx     BP 124/78 (BP Location: Right Arm, Patient Position: Sitting, Cuff Size: Normal)   Pulse 74   Temp 97.9 F (36.6 C)   Resp 14   Ht 5\' 4"  (1.626 m)   Wt 170 lb (77.1 kg)   SpO2 97%   BMI 29.18 kg/m    Review of Systems She denies hypoglycemia    Objective:   Physical Exam VITAL SIGNS:  See vs page GENERAL: no distress Pulses: dorsalis pedis intact bilat.   MSK:  no deformity of the feet CV: 1+ bilat leg edema Skin:  no ulcer on the feet.  normal color and temp on the feet.  Neuro: sensation is intact to touch on the feet, but decreased from normal.   Ext: There is bilateral onychomycosis of the toenails.   Lab Results   Component Value Date   HGBA1C 9.1 (H) 09/02/2018   Lab Results  Component Value Date   CREATININE 1.10 (H) 10/05/2018   BUN 18 10/05/2018   NA 140 10/05/2018   K 4.8 10/05/2018   CL 103 10/05/2018   CO2 22 10/05/2018        Assessment & Plan:  Insulin-requiring type 2 DM, with renal insuff: worse.   CAD: in this setting, we should increase the insulin slowly, especially given limited cbg info.   Edema: This limits rx options   Patient Instructions  check your blood sugar twice a day.  vary the time of day when you check, between before the 3 meals, and at bedtime.  also check if you have symptoms of your blood sugar being too high or too low.  please keep a record of the readings and bring it to your next appointment here (or you can bring the meter itself).  You can write it on any piece of paper.  please call us sooner if your blood sugar goes below 70, or if you have a lot of readings over 200. Please increase the levemir to 50 each morning.   On this type of insulin schedule, you should eat meals on a regular schedule.  If a meal is missed or significantly delayed, your blood sugar could go low.   Please come back for a follow-up appointment in 2 months.   '

## 2018-10-19 ENCOUNTER — Telehealth: Payer: Self-pay

## 2018-10-19 NOTE — Telephone Encounter (Signed)
   Primary Cardiologist:  Dr.Peter Martinique  Patient contacted.  History reviewed.  No symptoms to suggest any unstable cardiac conditions.  Based on discussion, with current pandemic situation, we will be postponing this 10/26/18 appointment for Alicia Medina with a plan for f/u 12/06/18 .  If symptoms change, she has been instructed to contact our office.     Kathyrn Lass, LPN  2/[id number]/6283 1:51 PM         .

## 2018-10-26 ENCOUNTER — Ambulatory Visit: Payer: Medicare Other | Admitting: Cardiology

## 2018-10-28 ENCOUNTER — Other Ambulatory Visit: Payer: Self-pay

## 2018-10-28 ENCOUNTER — Other Ambulatory Visit: Payer: Self-pay | Admitting: Cardiology

## 2018-10-28 MED ORDER — CLOPIDOGREL BISULFATE 75 MG PO TABS: 75.0000 mg | ORAL_TABLET | Freq: Every day | ORAL | 3 refills | Status: DC

## 2018-10-28 NOTE — Telephone Encounter (Signed)
k-dur, rosuvastatin,and metoprolol tart refilled.

## 2018-11-01 ENCOUNTER — Ambulatory Visit (INDEPENDENT_AMBULATORY_CARE_PROVIDER_SITE_OTHER): Payer: Medicare Other | Admitting: Family Medicine

## 2018-11-01 ENCOUNTER — Encounter: Payer: Self-pay | Admitting: Family Medicine

## 2018-11-01 ENCOUNTER — Other Ambulatory Visit: Payer: Self-pay

## 2018-11-01 VITALS — BP 115/54 | HR 103 | Ht 64.0 in | Wt 169.0 lb

## 2018-11-01 DIAGNOSIS — E0829 Diabetes mellitus due to underlying condition with other diabetic kidney complication: Secondary | ICD-10-CM

## 2018-11-01 DIAGNOSIS — E0822 Diabetes mellitus due to underlying condition with diabetic chronic kidney disease: Secondary | ICD-10-CM | POA: Diagnosis not present

## 2018-11-01 DIAGNOSIS — E1169 Type 2 diabetes mellitus with other specified complication: Secondary | ICD-10-CM | POA: Diagnosis not present

## 2018-11-01 DIAGNOSIS — F4329 Adjustment disorder with other symptoms: Secondary | ICD-10-CM

## 2018-11-01 DIAGNOSIS — E890 Postprocedural hypothyroidism: Secondary | ICD-10-CM | POA: Diagnosis not present

## 2018-11-01 DIAGNOSIS — R809 Proteinuria, unspecified: Secondary | ICD-10-CM | POA: Diagnosis not present

## 2018-11-01 DIAGNOSIS — E1159 Type 2 diabetes mellitus with other circulatory complications: Secondary | ICD-10-CM

## 2018-11-01 DIAGNOSIS — Z794 Long term (current) use of insulin: Secondary | ICD-10-CM

## 2018-11-01 DIAGNOSIS — I25118 Atherosclerotic heart disease of native coronary artery with other forms of angina pectoris: Secondary | ICD-10-CM | POA: Diagnosis not present

## 2018-11-01 DIAGNOSIS — N183 Chronic kidney disease, stage 3 unspecified: Secondary | ICD-10-CM

## 2018-11-01 DIAGNOSIS — I10 Essential (primary) hypertension: Secondary | ICD-10-CM

## 2018-11-01 DIAGNOSIS — Z9119 Patient's noncompliance with other medical treatment and regimen: Secondary | ICD-10-CM | POA: Diagnosis not present

## 2018-11-01 DIAGNOSIS — I252 Old myocardial infarction: Secondary | ICD-10-CM | POA: Diagnosis not present

## 2018-11-01 DIAGNOSIS — Z91199 Patient's noncompliance with other medical treatment and regimen due to unspecified reason: Secondary | ICD-10-CM

## 2018-11-01 DIAGNOSIS — I2510 Atherosclerotic heart disease of native coronary artery without angina pectoris: Secondary | ICD-10-CM | POA: Insufficient documentation

## 2018-11-01 DIAGNOSIS — E785 Hyperlipidemia, unspecified: Secondary | ICD-10-CM | POA: Diagnosis not present

## 2018-11-01 DIAGNOSIS — I152 Hypertension secondary to endocrine disorders: Secondary | ICD-10-CM

## 2018-11-01 NOTE — Progress Notes (Signed)
Virtual Visit via Telephone Note for [employer], D.O- Primary Care Physician at [facility]   I connected with current patient today by telephone and verified that I am speaking with the correct person using two identifiers.   Because of federal recommendations of social distancing due to the current novel COVID-19 outbreak, an audio/video telehealth visit is felt to be most appropriate for this patient at this time.  My staff members also discussed with the patient that there may be a patient charge related to this service.   The patient expressed understanding, and agreed to proceed.    History of Present Illness:   CAD/ s/p recent Angio:  Seen by Cards- 09/05/18 hpi per below: "History of Present Illness: Alicia Medina is seen today for follow up CAD. She has had an inferior MI treated with BMS to the RCA in February 2013. She required defibrillation multiple times and her hospitalization was complicated by cardiogenic shock, VF, VDRF and CHB requiring a temporary pacemaker. She had abrupt occlusion of the RCA stent that was treated with a 2nd BMS to the RCA.Followup nuclear stress testing in March 2013 showed normal perfusion throughout with ejection fraction of 76%.  In September 2018 she underwent tendon release on her right ring and small finger.   On followup today she is doing  OK. She does notes some intermittent chest discomfort radiating into her back between her shoulder blades. May occur anytime and are not related to activity. Notes sugars have been difficult to control and insulin dose increased. She states she sometimes is forgetful about taking insulin. Her husband is being considered for TAVR."   ---> Pt had Cardiac Cath- dr Martinique did it march 17th-  1 stent placed"   NO current CP/ discomfort.       DM- sees Dr Loanne Drilling for [facility]- last seen 10/18/18;   He inc Levimir to 50U q am.  Last A1c done by her endocrinologist was on 09/02/2018 and was 9.1.  Tolerating  meds well and taking as directed by her endocrinologist.    BP:   124/49, p- 105;   115/43, p-103 patient denies any dizziness, nausea, headaches, visual changes, heart palpitations, chest pain or shortness of breath.  Taking meds as directed by her cardiologist.    Hypothyroidism:   We have pt on 158mcg daily.  Labs last changed in 2/202- stable.    See below.    Recent Results (from the past 2160 hour(s))  POCT HgB A1C     Status: Abnormal   Collection Time: 08/16/18  1:19 PM  Result Value Ref Range   Hemoglobin A1C 8.7 (A) 4.0 - 5.6 %   HbA1c POC (<> result, manual entry)     HbA1c, POC (prediabetic range)     HbA1c, POC (controlled diabetic range)    CBC w/Diff/Platelet     Status: None   Collection Time: 09/02/18 11:27 AM  Result Value Ref Range   WBC 5.3 3.4 - 10.8 x10E3/uL   RBC 4.41 3.77 - 5.28 x10E6/uL   Hemoglobin 12.7 11.1 - 15.9 g/dL   Hematocrit 38.5 34.0 - 46.6 %   MCV 87 79 - 97 fL   MCH 28.8 26.6 - 33.0 pg   MCHC 33.0 31.5 - 35.7 g/dL   RDW 13.7 11.7 - 15.4 %   Platelets 274 150 - 450 x10E3/uL   Neutrophils 51 Not Estab. %   Lymphs 31 Not Estab. %   Monocytes 13 Not Estab. %  Eos 4 Not Estab. %   Basos 1 Not Estab. %   Neutrophils Absolute 2.7 1.4 - 7.0 x10E3/uL   Lymphocytes Absolute 1.6 0.7 - 3.1 x10E3/uL   Monocytes Absolute 0.7 0.1 - 0.9 x10E3/uL   EOS (ABSOLUTE) 0.2 0.0 - 0.4 x10E3/uL   Basophils Absolute 0.0 0.0 - 0.2 x10E3/uL   Immature Granulocytes 0 Not Estab. %   Immature Grans (Abs) 0.0 0.0 - 0.1 G25K2/HC  Basic metabolic panel     Status: Abnormal   Collection Time: 09/02/18 11:27 AM  Result Value Ref Range   Glucose 169 (H) 65 - 99 mg/dL   BUN 18 8 - 27 mg/dL   Creatinine, Ser 1.07 (H) 0.57 - 1.00 mg/dL   GFR calc non Af Amer 48 (L) >59 mL/min/1.73   GFR calc Af Amer 55 (L) >59 mL/min/1.73   BUN/Creatinine Ratio 17 12 - 28   Sodium 140 134 - 144 mmol/L   Potassium 5.1 3.5 - 5.2 mmol/L   Chloride 102 96 - 106 mmol/L   CO2 22 20 - 29  mmol/L   Calcium 10.0 8.7 - 10.3 mg/dL  Lipid panel     Status: None   Collection Time: 09/02/18 11:27 AM  Result Value Ref Range   Cholesterol, Total 142 100 - 199 mg/dL   Triglycerides 103 0 - 149 mg/dL   HDL 48 >39 mg/dL   VLDL Cholesterol Cal 21 5 - 40 mg/dL   LDL Calculated 73 0 - 99 mg/dL   Chol/HDL Ratio 3.0 0.0 - 4.4 ratio    Comment:                                   T. Chol/HDL Ratio                                             Men  Women                               1/2 Avg.Risk  3.4    3.3                                   Avg.Risk  5.0    4.4                                2X Avg.Risk  9.6    7.1                                3X Avg.Risk 23.4   11.0   Hepatic function panel     Status: None   Collection Time: 09/02/18 11:27 AM  Result Value Ref Range   Total Protein 7.3 6.0 - 8.5 g/dL   Albumin 4.3 3.6 - 4.6 g/dL    Comment:               **Please note reference interval change**   Bilirubin Total 0.4 0.0 - 1.2 mg/dL   Bilirubin, Direct 0.12 0.00 - 0.40 mg/dL   Alkaline Phosphatase 47 39 - 117 IU/L  AST 23 0 - 40 IU/L   ALT 22 0 - 32 IU/L  Hemoglobin A1c     Status: Abnormal   Collection Time: 09/02/18 11:27 AM  Result Value Ref Range   Hgb A1c MFr Bld 9.1 (H) 4.8 - 5.6 %    Comment:          Prediabetes: 5.7 - 6.4          Diabetes: >6.4          Glycemic control for adults with diabetes: <7.0    Est. average glucose Bld gHb Est-mCnc 214 mg/dL  T4, free     Status: None   Collection Time: 09/02/18 11:27 AM  Result Value Ref Range   Free T4 1.56 0.82 - 1.77 ng/dL  TSH     Status: None   Collection Time: 09/02/18 11:27 AM  Result Value Ref Range   TSH 3.800 0.450 - 4.500 uIU/mL  Myocardial Perfusion Imaging     Status: None   Collection Time: 09/22/18 10:47 AM  Result Value Ref Range   Rest HR 69 bpm   Rest BP 150/75 mmHg   Peak HR 114 bpm   Peak BP 171/68 mmHg   SSS 10    SRS 7    SDS 3    TID 1.08    LV sys vol 30 mL   LV dias vol 81 46 - 106  mL  CBC w/Diff/Platelet     Status: None   Collection Time: 10/05/18 11:39 AM  Result Value Ref Range   WBC 5.6 3.4 - 10.8 x10E3/uL   RBC 4.11 3.77 - 5.28 x10E6/uL   Hemoglobin 12.1 11.1 - 15.9 g/dL   Hematocrit 36.4 34.0 - 46.6 %   MCV 89 79 - 97 fL   MCH 29.4 26.6 - 33.0 pg   MCHC 33.2 31.5 - 35.7 g/dL   RDW 14.0 11.7 - 15.4 %   Platelets 267 150 - 450 x10E3/uL   Neutrophils 47 Not Estab. %   Lymphs 31 Not Estab. %   Monocytes 16 Not Estab. %   Eos 5 Not Estab. %   Basos 1 Not Estab. %   Neutrophils Absolute 2.6 1.4 - 7.0 x10E3/uL   Lymphocytes Absolute 1.7 0.7 - 3.1 x10E3/uL   Monocytes Absolute 0.9 0.1 - 0.9 x10E3/uL   EOS (ABSOLUTE) 0.3 0.0 - 0.4 x10E3/uL   Basophils Absolute 0.0 0.0 - 0.2 x10E3/uL   Immature Granulocytes 0 Not Estab. %   Immature Grans (Abs) 0.0 0.0 - 0.1 H70Y6/VZ  Basic metabolic panel     Status: Abnormal   Collection Time: 10/05/18 11:39 AM  Result Value Ref Range   Glucose 192 (H) 65 - 99 mg/dL   BUN 18 8 - 27 mg/dL   Creatinine, Ser 1.10 (H) 0.57 - 1.00 mg/dL   GFR calc non Af Amer 47 (L) >59 mL/min/1.73   GFR calc Af Amer 54 (L) >59 mL/min/1.73   BUN/Creatinine Ratio 16 12 - 28   Sodium 140 134 - 144 mmol/L   Potassium 4.8 3.5 - 5.2 mmol/L   Chloride 103 96 - 106 mmol/L   CO2 22 20 - 29 mmol/L   Calcium 9.3 8.7 - 10.3 mg/dL  Glucose, capillary     Status: Abnormal   Collection Time: 10/11/18  5:58 AM  Result Value Ref Range   Glucose-Capillary 201 (H) 70 - 99 mg/dL  POCT Activated clotting time     Status: None   Collection Time: 10/11/18  8:11 AM  Result Value Ref Range   Activated Clotting Time 263 seconds  POCT Activated clotting time     Status: None   Collection Time: 10/11/18  8:39 AM  Result Value Ref Range   Activated Clotting Time 235 seconds  Glucose, capillary     Status: Abnormal   Collection Time: 10/11/18  9:17 AM  Result Value Ref Range   Glucose-Capillary 181 (H) 70 - 99 mg/dL        Wt Readings from Last 3  Encounters:  11/01/18 169 lb (76.7 kg)  10/18/18 170 lb (77.1 kg)  10/11/18 170 lb (77.1 kg)    BP Readings from Last 3 Encounters:  11/01/18 (!) 115/54  10/18/18 124/78  10/11/18 (!) 153/60    Pulse Readings from Last 3 Encounters:  11/01/18 (!) 103  10/18/18 74  10/11/18 97    BMI Readings from Last 3 Encounters:  11/01/18 29.01 kg/m  10/18/18 29.18 kg/m  10/11/18 29.18 kg/m    -Vitals obtained; Medications, allergies reconciled;  personal medical, social, Sx etc. etc. histories were updated by Lanier Prude the medical assistant today and are reflected in below chart    Patient Care Team    Relationship Specialty Notifications Start End  Mellody Dance, DO PCP - General Family Medicine  06/12/16   Martinique, Peter M, MD PCP - Cardiology Cardiology Admissions 10/11/18   [place], Peter M, MD Consulting Physician Cardiology  07/22/16   Maisie Fus, MD Consulting Physician Obstetrics and Gynecology  34/74/25   Leighton Ruff, MD Consulting Physician General Surgery  07/22/16   Armandina Gemma, MD Consulting Physician General Surgery  07/22/16   Daryll Brod, MD Consulting Physician Orthopedic Surgery  07/22/16      Patient Active Problem List   Diagnosis Date Noted   Diabetes mellitus due to underlying condition with stage 3 chronic kidney disease, without long-term current use of insulin ([place]) 11/29/2017    Priority: High   Diabetes mellitus due to underlying condition with microalbuminuria, with long-term current use of insulin ([place]) 03/11/2017    Priority: High   Hypertension associated with diabetes ([place]) 09/27/2014    Priority: High   Hyperlipidemia associated with type 2 diabetes mellitus ([place]) 11/12/2011    Priority: High   Old inferior wall myocardial infarction 08/29/2011    Priority: High   Hemorrhoids, internal, with bleeding 05/08/2013    Priority: Medium   History of noncompliance with medical treatment 11/10/2018   CAD (coronary artery  disease) 11/01/2018   Angina pectoris ([place]) 10/11/2018   Abnormal nuclear stress test 10/11/2018   Hx of thyroidectomy 09/13/2017   Noncompliance with diet and medication regimen 09/13/2017   Stress and adjustment reaction 06/11/2017   Strain of latissimus dorsi muscle 06/11/2017   History of vitamin D deficiency 03/11/2017   Other fatigue 03/11/2017   Abscess 09/03/2016   Hypothyroidism, postsurgical 07/22/2016     Current Meds  Medication Sig   acetaminophen (TYLENOL) 500 MG tablet Take 1,000 mg by mouth at bedtime.   Ascorbic Acid (VITAMIN C) 1000 MG tablet Take 1,000 mg by mouth every evening.   aspirin EC 81 MG tablet Take 81 mg by mouth daily.   Biotin w/ Vitamins C & E (HAIR/SKIN/NAILS PO) Take 1 tablet by mouth 2 (two) times daily.   bismuth subsalicylate (PEPTO BISMOL) 262 MG chewable tablet Chew 131-262 mg by mouth 4 (four) times daily as needed for diarrhea or loose stools.   calcium carbonate (CALCIUM 600) 1500 (600 Ca) MG  TABS tablet Take 600 mg of elemental calcium by mouth once a week.    Cholecalciferol (VITAMIN D-3) 125 MCG (5000 UT) TABS Take 5,000 Units by mouth daily.   Chromium Picolinate 1000 MCG TABS Take 1,000 mcg by mouth daily.   clopidogrel (PLAVIX) 75 MG tablet Take 1 tablet (75 mg total) by mouth daily.   Coenzyme Q10 200 MG TABS Take 200 mg by mouth daily.    COLLAGEN PO Take 1,000 mg by mouth 2 (two) times daily.   folic acid (FOLVITE) 606 MCG tablet Take 800 mcg by mouth daily.   glucose blood (TRUE METRIX BLOOD GLUCOSE TEST) test strip 1 each by Other route 2 (two) times daily. And lancets 2/day   Insulin Detemir (LEVEMIR FLEXTOUCH) 100 UNIT/ML Pen Inject 50 Units into the skin every morning. And pen needles 1/day   loperamide (IMODIUM A-D) 2 MG tablet Take 1-2 mg by mouth 4 (four) times daily as needed for diarrhea or loose stools.   loratadine (CLARITIN) 10 MG tablet Take 10 mg by mouth at bedtime.   losartan (COZAAR) 25 MG  tablet Take 1 tablet (25 mg total) by mouth every morning.   Magnesium 250 MG TABS Take 250 mg by mouth daily.   metoprolol tartrate (LOPRESSOR) 50 MG tablet TAKE 1 TABLET BY MOUTH 2 TIMES DAILY.   Multiple Vitamin (MULTIVITAMIN) tablet Take 1 tablet by mouth once a week.    potassium chloride SA (K-DUR,KLOR-CON) 20 MEQ tablet TAKE 1 TABLET BY MOUTH DAILY.   Psyllium (METAMUCIL PO) Take 1 capsule by mouth daily.   rosuvastatin (CRESTOR) 20 MG tablet TAKE 1 TABLET BY MOUTH DAILY.   SYNTHROID 150 MCG tablet TAKE 1 TABLET BY MOUTH DAILY BEFORE BREAKFAST. (Patient taking differently: Take 150 mcg by mouth daily before breakfast. )   vitamin B-12 (CYANOCOBALAMIN) 500 MCG tablet Take 500 mcg by mouth daily.   VITAMIN E PO Take 500 mg by mouth 2 (two) times a week.      Allergies:  Allergies  Allergen Reactions   Demeclocycline Other (See Comments)    Severe stomach cramps Severe stomach cramps Severe stomach cramps   Lisinopril Cough   Tetracyclines & Related Other (See Comments)    Severe stomach cramps     ROS:  See above HPI for pertinent positives and negatives   Objective:   Blood pressure (!) 115/54, pulse (!) 103, height 5\' 4"  (1.626 m), weight 169 lb (76.7 kg). General: sounds in no acute distress.  Skin: Pt confirms warm and dry  extremities and pink fingertips Respiratory: speaking in full sentences, no conversational dyspnea Psych: A and O *3, appears insight good, mood- full      Impression and Recommendations:      ICD-10-CM   1. Diabetes mellitus due to underlying condition with microalbuminuria, with long-term current use of insulin (HCC) E08.29    R80.9    Z79.4   2. Diabetes mellitus due to underlying condition with stage 3 chronic kidney disease, without long-term current use of insulin ([facility]CC) E08.22    N18.3   3. Hyperlipidemia associated with type 2 diabetes mellitus ([place]) E11.69    E78.5   4. Hypertension associated with diabetes ([place]) E11.59     I10   5. Old inferior wall myocardial infarction I25.2   6. Hypothyroidism, postsurgical E89.0   7. Stress and adjustment reaction F43.29   8. Coronary artery disease of native artery of native heart with stable angina pectoris ([place]) I25.118   9. History of  noncompliance with medical treatment Z91.19    -Patient continues to be extremely noncompliant and very apathetic when discussing her blood pressure, diabetes, thyroid etc.  -Encouraged patient to engage in healthy dietary and lifestyle habits.  -Reviewed American Heart Association guidelines for exercise and encouraged her to start with walking only.  Increase to a goal of 45 minutes daily.  -Patient denies barriers this such as depression, anxiety etc.  I discussed the assessment and treatment plan with the patient. The patient was provided an opportunity to ask questions and all were answered.  - The patient agreed with the plan and demonstrated an understanding of the instructions.   No barriers to understanding were identified.  Red flag symptoms and signs discussed in detail.  Patient expressed understanding regarding what to do in case of emergency\urgent symptoms   The patient was advised to call back or seek an in-person evaluation if the symptoms worsen or if the condition fails to improve as anticipated.   Return for Every 4 to 6 months since she has other specialists managing her Dm, BP/ chol etc.     Gross side effects, risk and benefits, and alternatives of medications and treatment plan in general discussed with patient.  Patient is aware that all medications have potential side effects and we are unable to predict every side effect or drug-drug interaction that may occur.   Patient was strongly encouraged to call with any questions or concerns they may have concerns.     I provided 15+ minutes of non-face-to-face time during this encounter.   Mellody Dance, DO

## 2018-11-10 DIAGNOSIS — Z9119 Patient's noncompliance with other medical treatment and regimen: Secondary | ICD-10-CM | POA: Insufficient documentation

## 2018-11-10 DIAGNOSIS — Z91199 Patient's noncompliance with other medical treatment and regimen due to unspecified reason: Secondary | ICD-10-CM | POA: Insufficient documentation

## 2018-11-30 DIAGNOSIS — N390 Urinary tract infection, site not specified: Secondary | ICD-10-CM | POA: Diagnosis not present

## 2018-11-30 DIAGNOSIS — N819 Female genital prolapse, unspecified: Secondary | ICD-10-CM | POA: Diagnosis not present

## 2018-12-06 ENCOUNTER — Telehealth: Payer: Medicare Other | Admitting: Cardiology

## 2018-12-06 ENCOUNTER — Ambulatory Visit: Payer: Medicare Other | Admitting: Cardiology

## 2018-12-16 ENCOUNTER — Other Ambulatory Visit: Payer: Self-pay

## 2018-12-21 ENCOUNTER — Encounter: Payer: Self-pay | Admitting: Endocrinology

## 2018-12-21 ENCOUNTER — Other Ambulatory Visit: Payer: Self-pay

## 2018-12-21 ENCOUNTER — Ambulatory Visit (INDEPENDENT_AMBULATORY_CARE_PROVIDER_SITE_OTHER): Payer: Medicare Other | Admitting: Endocrinology

## 2018-12-21 VITALS — BP 130/62 | HR 101 | Temp 98.5°F | Wt 170.0 lb

## 2018-12-21 DIAGNOSIS — E1159 Type 2 diabetes mellitus with other circulatory complications: Secondary | ICD-10-CM

## 2018-12-21 DIAGNOSIS — E1129 Type 2 diabetes mellitus with other diabetic kidney complication: Secondary | ICD-10-CM

## 2018-12-21 DIAGNOSIS — R609 Edema, unspecified: Secondary | ICD-10-CM

## 2018-12-21 DIAGNOSIS — E0829 Diabetes mellitus due to underlying condition with other diabetic kidney complication: Secondary | ICD-10-CM

## 2018-12-21 DIAGNOSIS — Z794 Long term (current) use of insulin: Secondary | ICD-10-CM | POA: Diagnosis not present

## 2018-12-21 LAB — POCT GLYCOSYLATED HEMOGLOBIN (HGB A1C): Hemoglobin A1C: 8.6 % — AB (ref 4.0–5.6)

## 2018-12-21 MED ORDER — INSULIN ISOPHANE HUMAN 100 UNIT/ML KWIKPEN: 35.0000 [IU] | PEN_INJECTOR | SUBCUTANEOUS | 11 refills | Status: DC

## 2018-12-21 NOTE — Patient Instructions (Addendum)
Please change the levemir to "NPH," 35 units each morning. Some specialists say that taking a high amount of folic acid (4-5 mg per day) helps the neuropathy. check your blood sugar twice a day.  vary the time of day when you check, between before the 3 meals, and at bedtime.  also check if you have symptoms of your blood sugar being too high or too low.  please keep a record of the readings and bring it to your next appointment here (or you can bring the meter itself).  You can write it on any piece of paper.  please call us sooner if your blood sugar goes below 70, or if you have a lot of readings over 200.  Please come back for a follow-up appointment in 2 months.

## 2018-12-21 NOTE — Progress Notes (Signed)
Subjective:    Patient ID: Alicia Medina, female    DOB: August 13, 1932, 85 y.o.   MRN: [id number]  HPI Pt returns for f/u of diabetes mellitus:  DM type: Insulin-requiring type 2 Dx'ed: 1324 Complications: CAD and renal insuff.  Therapy: insulin since 2013.   GDM: never.  DKA: never.   Severe hypoglycemia: never.   Pancreatitis: never.  Pancreatic imaging: never Other: she declines multiple daily injections; She did not tolerate metformin (diarrhea); she changed lantus to levemir, based on the pattern of cbg's.   Interval history: she brings a record of her cbg's which I have reviewed today.  Almost all are fasting, but it is in general higher as the day goes on.  It varies from 106-324.  She misses the insulin approx twice per month.  pt states she feels well in general.   Past Medical History:  Diagnosis Date  . Arthritis   . CAD (coronary artery disease) CARDIOLOGIST-  DR Martinique   08-28-2011 Inferior STEMI with BMS to the RCA, complicated by cardiogenic shock, VF, VDRF , temporary CHB and abrupt reocclusion of the RCA stent treated with a second BMS to the RCA. Has residual LAD disease. EF is 50%  . Diverticulosis of colon   . Hemorrhoids, internal, with bleeding   . Hyperlipidemia   . Hypertension   . Hypothyroidism, postsurgical   . S/p bare metal coronary artery stent    2013  . Type 2 diabetes mellitus ([place])   . Wears hearing aid    both ears    Past Surgical History:  Procedure Laterality Date  . CARDIOVASCULAR STRESS TEST  10-03-2011  dr Martinique   normal perfusion / no ischemia/ ef 77%  . CARPAL TUNNEL RELEASE Right 01/25/2013   Procedure: CARPAL TUNNEL RELEASE;  Surgeon: Wynonia Sours, MD;  Location: [place];  Service: Orthopedics;  Laterality: Right;  . CARPAL TUNNEL RELEASE Left 10/25/2013   Procedure: LEFT CARPAL TUNNEL RELEASE;  Surgeon: Wynonia Sours, MD;  Location: [place];  Service: Orthopedics;  Laterality: Left;  . CATARACT  EXTRACTION W/ INTRAOCULAR LENS  IMPLANT, BILATERAL  2007  . COLONOSCOPY  04-27-2003  . CORONARY ANGIOGRAM Left 08/28/2011   Procedure: CORONARY ANGIOGRAM;  Surgeon: Peter M Martinique, MD;  Location: [facility] CATH LAB;  Service: Cardiovascular;  Laterality: Left;  . CORONARY ANGIOPLASTY WITH STENT PLACEMENT  08-28-2011  dr Martinique   BMS to [facility] with abrupt reocclusion treated with second BMS/  LAD ostial 90%, proximal50-60%, first diagonal 80-90%  . CORONARY STENT INTERVENTION N/A 10/11/2018   Procedure: CORONARY STENT INTERVENTION;  Surgeon: Martinique, Peter M, MD;  Location: [place] CV LAB;  Service: Cardiovascular;  Laterality: N/A;  . LEFT HEART CATH AND CORONARY ANGIOGRAPHY N/A 10/11/2018   Procedure: LEFT HEART CATH AND CORONARY ANGIOGRAPHY;  Surgeon: Martinique, Peter M, MD;  Location: [place] CV LAB;  Service: Cardiovascular;  Laterality: N/A;  . LEFT HEART CATHETERIZATION WITH CORONARY ANGIOGRAM N/A 08/28/2011   Procedure: LEFT HEART CATHETERIZATION WITH CORONARY ANGIOGRAM;  Surgeon: Peter M Martinique, MD;  Location: [facility] CATH LAB;  Service: Cardiovascular;  Laterality: N/A;  . PERCUTANEOUS CORONARY STENT INTERVENTION (PCI-S) Left 08/28/2011   Procedure: PERCUTANEOUS CORONARY STENT INTERVENTION (PCI-S);  Surgeon: Peter M Martinique, MD;  Location: [facility] CATH LAB;  Service: Cardiovascular;  Laterality: Left;  . TOTAL THYROIDECTOMY Bilateral 05-02-2007   multinodular goiter  . TRANSANAL HEMORRHOIDAL DEARTERIALIZATION N/A 03/28/2014   Procedure: TRANSANAL HEMORRHOIDAL DEARTERIALIZATION;  Surgeon: Leighton Ruff,  MD;  Location: [place];  Service: General;  Laterality: N/A;  . TRANSTHORACIC ECHOCARDIOGRAM  08-30-2011   mild LVH/ ef 61-60%/  grade I diastolic dysfunction  . TRIGGER FINGER RELEASE Right 01/25/2013   Procedure: RELEASE TRIGGER FINGER/A-1 PULLEY RIGHT INDEX FINGER;  Surgeon: Wynonia Sours, MD;  Location: [place];  Service: Orthopedics;  Laterality: Right;  . TRIGGER FINGER  RELEASE Left 10/25/2013   Procedure: RELEASE A-1 PULLEY LEFT MIDDLE FINGER;  Surgeon: Wynonia Sours, MD;  Location: [place];  Service: Orthopedics;  Laterality: Left;  . TRIGGER FINGER RELEASE Left 12/04/2014   Procedure: RELEASE TRIGGER FINGER/A-1 PULLEY LEFT RING FINGER;  Surgeon: Daryll Brod, MD;  Location: [place];  Service: Orthopedics;  Laterality: Left;  . TRIGGER FINGER RELEASE Right 03/30/2017   Procedure: RELEASE TRIGGER FINGER/A-1 PULLEY RIGHT RING FINGER AND RIGHT SMALL FINGER;  Surgeon: Daryll Brod, MD;  Location: [place];  Service: Orthopedics;  Laterality: Right;    Social History   Socioeconomic History  . Marital status: Married    Spouse name: Not on file  . Number of children: Not on file  . Years of education: Not on file  . Highest education level: Not on file  Occupational History  . Not on file  Social Needs  . Financial resource strain: Not on file  . Food insecurity:    Worry: Not on file    Inability: Not on file  . Transportation needs:    Medical: Not on file    Non-medical: Not on file  Tobacco Use  . Smoking status: Never Smoker  . Smokeless tobacco: Never Used  Substance and Sexual Activity  . Alcohol use: Yes    Alcohol/week: 1.0 standard drinks    Types: 1 Glasses of wine per week    Comment: social  . Drug use: No  . Sexual activity: Not on file  Lifestyle  . Physical activity:    Days per week: Not on file    Minutes per session: Not on file  . Stress: Not on file  Relationships  . Social connections:    Talks on phone: Not on file    Gets together: Not on file    Attends religious service: Not on file    Active member of club or organization: Not on file    Attends meetings of clubs or organizations: Not on file    Relationship status: Not on file  . Intimate partner violence:    Fear of current or ex partner: Not on file    Emotionally abused: Not on file    Physically abused: Not  on file    Forced sexual activity: Not on file  Other Topics Concern  . Not on file  Social History Narrative   Lives with husband.   Has 2 sons- both healthy.   Younger son lives in [place], [place] in order son lives in [place].    Current Outpatient Medications on File Prior to Visit  Medication Sig Dispense Refill  . acetaminophen (TYLENOL) 500 MG tablet Take 1,000 mg by mouth at bedtime.    . Ascorbic Acid (VITAMIN C) 1000 MG tablet Take 1,000 mg by mouth every evening.    Marland Kitchen aspirin EC 81 MG tablet Take 81 mg by mouth daily.    . Biotin w/ Vitamins C & E (HAIR/SKIN/NAILS PO) Take 1 tablet by mouth 2 (two) times daily.    Marland Kitchen bismuth subsalicylate (PEPTO BISMOL)  262 MG chewable tablet Chew 131-262 mg by mouth 4 (four) times daily as needed for diarrhea or loose stools.    . calcium carbonate (CALCIUM 600) 1500 (600 Ca) MG TABS tablet Take 600 mg of elemental calcium by mouth once a week.     . Cholecalciferol (VITAMIN D-3) 125 MCG (5000 UT) TABS Take 5,000 Units by mouth daily.    . Chromium Picolinate 1000 MCG TABS Take 1,000 mcg by mouth daily.    . clopidogrel (PLAVIX) 75 MG tablet Take 1 tablet (75 mg total) by mouth daily. 30 tablet 3  . Coenzyme Q10 200 MG TABS Take 200 mg by mouth daily.     . COLLAGEN PO Take 1,000 mg by mouth 2 (two) times daily.    . folic acid (FOLVITE) 287 MCG tablet Take 800 mcg by mouth daily.    Marland Kitchen glucose blood (TRUE METRIX BLOOD GLUCOSE TEST) test strip 1 each by Other route 2 (two) times daily. And lancets 2/day 200 each 3  . loperamide (IMODIUM A-D) 2 MG tablet Take 1-2 mg by mouth 4 (four) times daily as needed for diarrhea or loose stools.    Marland Kitchen loratadine (CLARITIN) 10 MG tablet Take 10 mg by mouth at bedtime.    Marland Kitchen losartan (COZAAR) 25 MG tablet Take 1 tablet (25 mg total) by mouth every morning. 90 tablet 3  . Magnesium 250 MG TABS Take 250 mg by mouth daily.    . metoprolol tartrate (LOPRESSOR) 50 MG tablet TAKE 1 TABLET BY MOUTH 2 TIMES DAILY. 180  tablet 1  . Multiple Vitamin (MULTIVITAMIN) tablet Take 1 tablet by mouth once a week.     . potassium chloride SA (K-DUR,KLOR-CON) 20 MEQ tablet TAKE 1 TABLET BY MOUTH DAILY. 90 tablet 1  . Psyllium (METAMUCIL PO) Take 1 capsule by mouth daily.    . rosuvastatin (CRESTOR) 20 MG tablet TAKE 1 TABLET BY MOUTH DAILY. 90 tablet 1  . SYNTHROID 150 MCG tablet TAKE 1 TABLET BY MOUTH DAILY BEFORE BREAKFAST. (Patient taking differently: Take 150 mcg by mouth daily before breakfast. ) 90 tablet 1  . vitamin B-12 (CYANOCOBALAMIN) 500 MCG tablet Take 500 mcg by mouth daily.    Marland Kitchen VITAMIN E PO Take 500 mg by mouth 2 (two) times a week.      No current facility-administered medications on file prior to visit.     Allergies  Allergen Reactions  . Demeclocycline Other (See Comments)    Severe stomach cramps Severe stomach cramps Severe stomach cramps  . Lisinopril Cough  . Tetracyclines & Related Other (See Comments)    Severe stomach cramps    Family History  Problem Relation Age of Onset  . Stroke Mother   . Cancer Mother        breast  . Cancer Father        lung  . Stroke Father   . Diabetes Neg Hx     BP 130/62 (BP Location: Right Arm, Patient Position: Sitting, Cuff Size: Normal)   Pulse (!) 101   Temp 98.5 F (36.9 C) (Oral)   Wt 170 lb (77.1 kg)   SpO2 97%   BMI 29.18 kg/m    Review of Systems She denies hypoglycemia.      Objective:   Physical Exam VITAL SIGNS:  See vs page GENERAL: no distress Pulses: dorsalis pedis intact bilat.   MSK: no deformity of the feet CV: trace bilat leg edema Skin:  no ulcer on the feet.  normal color and temp on the feet. Neuro: sensation is intact to touch on the feet, but decreased from normal.   Ext: There is bilateral onychomycosis of the toenails.    Lab Results  Component Value Date   CREATININE 1.10 (H) 10/05/2018   BUN 18 10/05/2018   NA 140 10/05/2018   K 4.8 10/05/2018   CL 103 10/05/2018   CO2 22 10/05/2018    Lab  Results  Component Value Date   HGBA1C 8.6 (A) 12/21/2018       Assessment & Plan:  Insulin-requiring type 2 DM, with CAD: she needs increased rx Renal insuff: in this setting, she needs a faster-acting qd insulin.   Edema: This limits rx options   Patient Instructions  Please change the levemir to "NPH," 35 units each morning. Some specialists say that taking a high amount of folic acid (4-5 mg per day) helps the neuropathy. check your blood sugar twice a day.  vary the time of day when you check, between before the 3 meals, and at bedtime.  also check if you have symptoms of your blood sugar being too high or too low.  please keep a record of the readings and bring it to your next appointment here (or you can bring the meter itself).  You can write it on any piece of paper.  please call us sooner if your blood sugar goes below 70, or if you have a lot of readings over 200.  Please come back for a follow-up appointment in 2 months.

## 2018-12-22 ENCOUNTER — Telehealth: Payer: Self-pay

## 2018-12-22 NOTE — Telephone Encounter (Signed)
PA initiated today through Cover My Meds for Humulin N Kwikpen. Will await insurance response re: approval/denial.  Alicia Medina (Key: [id number])  Your information has been submitted to [place] [employer] Part D. [employer] will review the request and will issue a decision, typically within 1-3 days from your submission. You can check the updated outcome later by reopening this request.  If [employer] has not responded in 1-3 days or if you have any questions about your ePA request, please contact [place] Medicare Part D at [phone]. If you think there may be a problem with your PA request, use our live chat feature at the bottom right.  Alicia Medina Key: [id number] - PA Case ID: [id number] - Rx #: [id number] Need help? Call us at [phone] Status Sent to Speed 100UNIT/ML pen-injectors Form Teacher, early years/pre PA Form Original Claim Info 569 Provide Notice: Medicare Prescription Drug Coverage and Your Rights

## 2018-12-23 NOTE — Telephone Encounter (Signed)
Patient stated it was going to cost her $180 for the Insulin NPH, Human,, Isophane, (HUMULIN N KWIKPEN) 100 UNIT/ML Kiwkpen, wanted to know if there was more options.  Please Advise, Thanks

## 2018-12-23 NOTE — Telephone Encounter (Signed)
Received notification from Cover My Meds that PA for Humulin N Claiborne Rigg has been approved. Documents have been labeled and placed in scan file for HIM and for our future reference.  Jeanella Cara Key: [id number] - PA Case ID: [id number] - Rx #: [id number] Need help? Call us at [phone] Outcome Approvedon May 28 Your request has been approved Drug HumuLIN N KwikPen 100UNIT/ML pen-injectors Form Teacher, early years/pre PA Form Original Claim Info 569 Provide Notice: Medicare Prescription Drug Coverage and Your Rights

## 2018-12-23 NOTE — Telephone Encounter (Signed)
Please advise 

## 2018-12-23 NOTE — Telephone Encounter (Signed)
I need clarification: is pt saying it is $180, even with insurance?

## 2018-12-26 NOTE — Telephone Encounter (Signed)
Based on the pattern of your blood sugars, this is the best insulin for you.  It is cheap if you draw it up from the bottle.

## 2018-12-26 NOTE — Telephone Encounter (Signed)
Called pt and made her aware. States she would prefer to discuss with the pharamcist to determine her out of pocket expense. Will cal if Rx needs changed from pens to vials.

## 2018-12-26 NOTE — Telephone Encounter (Signed)
Her copay is $180.00 for this medication

## 2019-01-25 ENCOUNTER — Telehealth: Payer: Self-pay | Admitting: Physician Assistant

## 2019-01-25 DIAGNOSIS — N819 Female genital prolapse, unspecified: Secondary | ICD-10-CM | POA: Diagnosis not present

## 2019-01-25 DIAGNOSIS — N390 Urinary tract infection, site not specified: Secondary | ICD-10-CM | POA: Diagnosis not present

## 2019-01-25 NOTE — Telephone Encounter (Signed)
LVM, reminding pt of her appt on 01-26-19 with Almyra Deforest.

## 2019-01-26 ENCOUNTER — Ambulatory Visit (INDEPENDENT_AMBULATORY_CARE_PROVIDER_SITE_OTHER): Payer: Medicare Other | Admitting: Physician Assistant

## 2019-01-26 ENCOUNTER — Encounter: Payer: Self-pay | Admitting: Physician Assistant

## 2019-01-26 ENCOUNTER — Other Ambulatory Visit: Payer: Self-pay

## 2019-01-26 VITALS — BP 126/74 | HR 88 | Temp 98.4°F | Wt 168.6 lb

## 2019-01-26 DIAGNOSIS — E890 Postprocedural hypothyroidism: Secondary | ICD-10-CM | POA: Diagnosis not present

## 2019-01-26 DIAGNOSIS — I2510 Atherosclerotic heart disease of native coronary artery without angina pectoris: Secondary | ICD-10-CM

## 2019-01-26 DIAGNOSIS — I10 Essential (primary) hypertension: Secondary | ICD-10-CM

## 2019-01-26 DIAGNOSIS — I209 Angina pectoris, unspecified: Secondary | ICD-10-CM

## 2019-01-26 DIAGNOSIS — Z794 Long term (current) use of insulin: Secondary | ICD-10-CM

## 2019-01-26 DIAGNOSIS — E1169 Type 2 diabetes mellitus with other specified complication: Secondary | ICD-10-CM

## 2019-01-26 DIAGNOSIS — E119 Type 2 diabetes mellitus without complications: Secondary | ICD-10-CM | POA: Diagnosis not present

## 2019-01-26 DIAGNOSIS — E785 Hyperlipidemia, unspecified: Secondary | ICD-10-CM

## 2019-01-26 NOTE — Progress Notes (Signed)
Cardiology Office Note    Date:  01/28/2019   ID:  Alicia Medina, DOB [birth year], MRN [id number]  PCP:  Mellody Dance, DO  Cardiologist:  Dr. Martinique   Chief Complaint  Patient presents with  . Follow-up    seen for Dr. Martinique, post PCI    History of Present Illness:  Alicia Medina is a 85 y.o. female with PMH of CAD, HTN, HLD, hypothyroidism, and DM II. patient had a inferior MI treated with BMS to RCA in 08/2011.  She required defibrillation multiple times and her hospitalization was complicated by cardiogenic shock, V. fib, VDRF, and complete heart block requiring temporary pacemaker.  Unfortunately, she had abrupt occlusion of the RCA stent and was treated with a second BMS to RCA.  Follow-up Myoview in March 2013 showed normal perfusion with EF of 76%.  Patient was seen recently by Dr. Martinique who noted she had intermittent chest pain radiating to her back between her shoulder blades.  Myoview showed moderate perfusion defect in the inferoapex, lateral apex, and and her apex that is new when compared to 2013.  The study was interpreted as intermediate risk with EF of 63%.  Patient ultimately underwent elective cardiac catheterization on 10/11/2018 which revealed a 100% proximal to mid LAD occlusion which was treated with 2.75 x 28 mm Synergy DES, widely patent mid RCA stent.   Patient presents today for cardiology office visit.  Since her stent placement, she has not had any further chest discomfort that radiated to her back.  She still occasionally have some pain however this type of pain is not consistent in one location and appears to be transient.  She does not have any lower extremity edema, orthopnea or PND.  Blood pressure is normal today at the 126/74.  She has been compliant with her dual antiplatelet therapy.  Overall, she is doing quite well at home.  She may follow-up with Dr. Martinique in 4 months.  Past Medical History:  Diagnosis Date  . Arthritis   . CAD (coronary artery  disease) CARDIOLOGIST-  DR Martinique   08-28-2011 Inferior STEMI with BMS to the RCA, complicated by cardiogenic shock, VF, VDRF , temporary CHB and abrupt reocclusion of the RCA stent treated with a second BMS to the RCA. Has residual LAD disease. EF is 50%  . Diverticulosis of colon   . Hemorrhoids, internal, with bleeding   . Hyperlipidemia   . Hypertension   . Hypothyroidism, postsurgical   . S/p bare metal coronary artery stent    2013  . Type 2 diabetes mellitus ([place])   . Wears hearing aid    both ears    Past Surgical History:  Procedure Laterality Date  . CARDIOVASCULAR STRESS TEST  10-03-2011  dr Martinique   normal perfusion / no ischemia/ ef 77%  . CARPAL TUNNEL RELEASE Right 01/25/2013   Procedure: CARPAL TUNNEL RELEASE;  Surgeon: Wynonia Sours, MD;  Location: [place];  Service: Orthopedics;  Laterality: Right;  . CARPAL TUNNEL RELEASE Left 10/25/2013   Procedure: LEFT CARPAL TUNNEL RELEASE;  Surgeon: Wynonia Sours, MD;  Location: [place];  Service: Orthopedics;  Laterality: Left;  . CATARACT EXTRACTION W/ INTRAOCULAR LENS  IMPLANT, BILATERAL  2007  . COLONOSCOPY  04-27-2003  . CORONARY ANGIOGRAM Left 08/28/2011   Procedure: CORONARY ANGIOGRAM;  Surgeon: Peter M Martinique, MD;  Location: [facility] CATH LAB;  Service: Cardiovascular;  Laterality: Left;  . CORONARY ANGIOPLASTY WITH STENT PLACEMENT  08-28-2011  dr Martinique   BMS to [facility] with abrupt reocclusion treated with second BMS/  LAD ostial 90%, proximal50-60%, first diagonal 80-90%  . CORONARY STENT INTERVENTION N/A 10/11/2018   Procedure: CORONARY STENT INTERVENTION;  Surgeon: Martinique, Peter M, MD;  Location: [place] CV LAB;  Service: Cardiovascular;  Laterality: N/A;  . LEFT HEART CATH AND CORONARY ANGIOGRAPHY N/A 10/11/2018   Procedure: LEFT HEART CATH AND CORONARY ANGIOGRAPHY;  Surgeon: Martinique, Peter M, MD;  Location: [place] CV LAB;  Service: Cardiovascular;  Laterality: N/A;  . LEFT HEART  CATHETERIZATION WITH CORONARY ANGIOGRAM N/A 08/28/2011   Procedure: LEFT HEART CATHETERIZATION WITH CORONARY ANGIOGRAM;  Surgeon: Peter M Martinique, MD;  Location: [facility] CATH LAB;  Service: Cardiovascular;  Laterality: N/A;  . PERCUTANEOUS CORONARY STENT INTERVENTION (PCI-S) Left 08/28/2011   Procedure: PERCUTANEOUS CORONARY STENT INTERVENTION (PCI-S);  Surgeon: Peter M Martinique, MD;  Location: [facility] CATH LAB;  Service: Cardiovascular;  Laterality: Left;  . TOTAL THYROIDECTOMY Bilateral 05-02-2007   multinodular goiter  . TRANSANAL HEMORRHOIDAL DEARTERIALIZATION N/A 03/28/2014   Procedure: TRANSANAL HEMORRHOIDAL DEARTERIALIZATION;  Surgeon: Leighton Ruff, MD;  Location: [facility];  Service: General;  Laterality: N/A;  . TRANSTHORACIC ECHOCARDIOGRAM  08-30-2011   mild LVH/ ef 86-76%/  grade I diastolic dysfunction  . TRIGGER FINGER RELEASE Right 01/25/2013   Procedure: RELEASE TRIGGER FINGER/A-1 PULLEY RIGHT INDEX FINGER;  Surgeon: Wynonia Sours, MD;  Location: [place];  Service: Orthopedics;  Laterality: Right;  . TRIGGER FINGER RELEASE Left 10/25/2013   Procedure: RELEASE A-1 PULLEY LEFT MIDDLE FINGER;  Surgeon: Wynonia Sours, MD;  Location: [place];  Service: Orthopedics;  Laterality: Left;  . TRIGGER FINGER RELEASE Left 12/04/2014   Procedure: RELEASE TRIGGER FINGER/A-1 PULLEY LEFT RING FINGER;  Surgeon: Daryll Brod, MD;  Location: [place];  Service: Orthopedics;  Laterality: Left;  . TRIGGER FINGER RELEASE Right 03/30/2017   Procedure: RELEASE TRIGGER FINGER/A-1 PULLEY RIGHT RING FINGER AND RIGHT SMALL FINGER;  Surgeon: Daryll Brod, MD;  Location: [place];  Service: Orthopedics;  Laterality: Right;    Current Medications: Outpatient Medications Prior to Visit  Medication Sig Dispense Refill  . acetaminophen (TYLENOL) 500 MG tablet Take 1,000 mg by mouth at bedtime.    . Ascorbic Acid (VITAMIN C) 1000 MG tablet Take 1,000 mg by  mouth every evening.    Marland Kitchen aspirin EC 81 MG tablet Take 81 mg by mouth daily.    . Biotin w/ Vitamins C & E (HAIR/SKIN/NAILS PO) Take 1 tablet by mouth 2 (two) times daily.    Marland Kitchen bismuth subsalicylate (PEPTO BISMOL) 262 MG chewable tablet Chew 131-262 mg by mouth 4 (four) times daily as needed for diarrhea or loose stools.    . calcium carbonate (CALCIUM 600) 1500 (600 Ca) MG TABS tablet Take 600 mg of elemental calcium by mouth once a week.     . Cholecalciferol (VITAMIN D-3) 125 MCG (5000 UT) TABS Take 5,000 Units by mouth daily.    . Chromium Picolinate 1000 MCG TABS Take 1,000 mcg by mouth daily.    . clopidogrel (PLAVIX) 75 MG tablet Take 1 tablet (75 mg total) by mouth daily. 30 tablet 3  . Coenzyme Q10 200 MG TABS Take 200 mg by mouth daily.     . COLLAGEN PO Take 1,000 mg by mouth 2 (two) times daily.    . folic acid (FOLVITE) 195 MCG tablet Take 800 mcg by mouth daily.    Marland Kitchen  glucose blood (TRUE METRIX BLOOD GLUCOSE TEST) test strip 1 each by Other route 2 (two) times daily. And lancets 2/day 200 each 3  . Insulin NPH, Human,, Isophane, (HUMULIN N KWIKPEN) 100 UNIT/ML Kiwkpen Inject 35 Units into the skin every morning. 15 mL 11  . loperamide (IMODIUM A-D) 2 MG tablet Take 1-2 mg by mouth 4 (four) times daily as needed for diarrhea or loose stools.    Marland Kitchen loratadine (CLARITIN) 10 MG tablet Take 10 mg by mouth at bedtime.    Marland Kitchen losartan (COZAAR) 25 MG tablet Take 1 tablet (25 mg total) by mouth every morning. 90 tablet 3  . Magnesium 250 MG TABS Take 250 mg by mouth daily.    . metoprolol tartrate (LOPRESSOR) 50 MG tablet TAKE 1 TABLET BY MOUTH 2 TIMES DAILY. 180 tablet 1  . Multiple Vitamin (MULTIVITAMIN) tablet Take 1 tablet by mouth once a week.     . potassium chloride SA (K-DUR,KLOR-CON) 20 MEQ tablet TAKE 1 TABLET BY MOUTH DAILY. 90 tablet 1  . Psyllium (METAMUCIL PO) Take 1 capsule by mouth daily.    . rosuvastatin (CRESTOR) 20 MG tablet TAKE 1 TABLET BY MOUTH DAILY. 90 tablet 1  .  SYNTHROID 150 MCG tablet TAKE 1 TABLET BY MOUTH DAILY BEFORE BREAKFAST. (Patient taking differently: Take 150 mcg by mouth daily before breakfast. ) 90 tablet 1  . vitamin B-12 (CYANOCOBALAMIN) 500 MCG tablet Take 500 mcg by mouth daily.    Marland Kitchen VITAMIN E PO Take 500 mg by mouth 2 (two) times a week.      No facility-administered medications prior to visit.      Allergies:   Demeclocycline, Lisinopril, and Tetracyclines & related   Social History   Socioeconomic History  . Marital status: Married    Spouse name: Not on file  . Number of children: Not on file  . Years of education: Not on file  . Highest education level: Not on file  Occupational History  . Not on file  Social Needs  . Financial resource strain: Not on file  . Food insecurity    Worry: Not on file    Inability: Not on file  . Transportation needs    Medical: Not on file    Non-medical: Not on file  Tobacco Use  . Smoking status: Never Smoker  . Smokeless tobacco: Never Used  Substance and Sexual Activity  . Alcohol use: Yes    Alcohol/week: 1.0 standard drinks    Types: 1 Glasses of wine per week    Comment: social  . Drug use: No  . Sexual activity: Not on file  Lifestyle  . Physical activity    Days per week: Not on file    Minutes per session: Not on file  . Stress: Not on file  Relationships  . Social Herbalist on phone: Not on file    Gets together: Not on file    Attends religious service: Not on file    Active member of club or organization: Not on file    Attends meetings of clubs or organizations: Not on file    Relationship status: Not on file  Other Topics Concern  . Not on file  Social History Narrative   Lives with husband.   Has 2 sons- both healthy.   Younger son lives in [place], [place] in order son lives in [place].     Family History:  The patient's family history includes Cancer in her father  and mother; Stroke in her father and mother.   ROS:   Please see the  history of present illness.    ROS All other systems reviewed and are negative.   PHYSICAL EXAM:   VS:  Pulse 88   Temp 98.4 F (36.9 C)   Wt 168 lb 9.6 oz (76.5 kg)   BMI 28.94 kg/m    GEN: Well nourished, well developed, in no acute distress  HEENT: normal  Neck: no JVD, carotid bruits, or masses Cardiac: RRR; no murmurs, rubs, or gallops,no edema  Respiratory:  clear to auscultation bilaterally, normal work of breathing GI: soft, nontender, nondistended, + BS MS: no deformity or atrophy  Skin: warm and dry, no rash Neuro:  Alert and Oriented x 3, Strength and sensation are intact Psych: euthymic mood, full affect  Wt Readings from Last 3 Encounters:  01/26/19 168 lb 9.6 oz (76.5 kg)  12/21/18 170 lb (77.1 kg)  11/01/18 169 lb (76.7 kg)      Studies/Labs Reviewed:   EKG:  EKG is ordered today.  The ekg ordered today demonstrates NSR with low voltage  Recent Labs: 09/02/2018: ALT 22; TSH 3.800 10/05/2018: BUN 18; Creatinine, Ser 1.10; Hemoglobin 12.1; Platelets 267; Potassium 4.8; Sodium 140   Lipid Panel    Component Value Date/Time   CHOL 142 09/02/2018 1127   TRIG 103 09/02/2018 1127   HDL 48 09/02/2018 1127   CHOLHDL 3.0 09/02/2018 1127   CHOLHDL 2.6 12/10/2016 0954   VLDL 26 12/10/2016 0954   LDLCALC 73 09/02/2018 1127    Additional studies/ records that were reviewed today include:   Cath 10/11/2018  Prox LAD to Mid LAD lesion is 100% stenosed.  Previously placed Mid RCA stent (unknown type) is widely patent.  Post intervention, there is a 0% residual stenosis.  A drug-eluting stent was successfully placed using a STENT SYNERGY DES [id number].  LV end diastolic pressure is normal.   1. Single vessel occlusive CAD with CTO of the mid LAD 2. Continued patency of stents in the RCA 3. Normal LVEDP 4. Successful CTO PCI of the mid LAD with DES x 1  Plan: DAPT for one year. Patient is a candidate for same day discharge.     ASSESSMENT:    1.  Coronary artery disease involving native coronary artery of native heart without angina pectoris   2. Essential hypertension   3. Hyperlipidemia associated with type 2 diabetes mellitus ([place])   4. Type 2 diabetes mellitus without complication, with long-term current use of insulin ([place])   5. Hypothyroidism, postsurgical      PLAN:  In order of problems listed above:  1. CAD: She has not had obvious anginal symptoms since stent placement.  Although she does have some atypical chest pain that appears to be very transient and does not stay in one location.  She is compliant with dual antiplatelet therapy  2. Hypertension: Blood pressure stable on current therapy  3. Hyperlipidemia: Continue statin therapy, Crestor 20 mg daily  4. DM2: Managed by primary care provider  5. Hypothyroidism: On Synthroid    Medication Adjustments/Labs and Tests Ordered: Current medicines are reviewed at length with the patient today.  Concerns regarding medicines are outlined above.  Medication changes, Labs and Tests ordered today are listed in the Patient Instructions below. Patient Instructions  Medication Instructions:   Your physician recommends that you continue on your current medications as directed. Please refer to the Current Medication list given to you today.  If you need a refill on your cardiac medications before your next appointment, please call your pharmacy.   Lab work:  NONE ordered at this time of appointment   If you have labs (blood work) drawn today and your tests are completely normal, you will receive your results only by: Marland Kitchen MyChart Message (if you have MyChart) OR . A paper copy in the mail If you have any lab test that is abnormal or we need to change your treatment, we will call you to review the results.  Testing/Procedures:  NONE ordered at this time of appointment   Follow-Up: At [facility], you and your health needs are our priority.  As part of our continuing  mission to provide you with exceptional heart care, we have created designated Provider Care Teams.  These Care Teams include your primary Cardiologist (physician) and Advanced Practice Providers (APPs -  Physician Assistants and Nurse Practitioners) who all work together to provide you with the care you need, when you need it. You will need a follow up appointment in 4 months (November 2020).  Please call our office months in September 2020 to schedule this appointment.  You may see Peter Martinique, MD or one of the following Advanced Practice Providers on your designated Care Team: [place], [place] . Fabian Sharp, PA-C  Any Other Special Instructions Will Be Listed Below (If Applicable).       Hilbert Corrigan, [place]  01/28/2019 12:27 AM    [address], [place], [place]  [address] Phone: [phone]; Fax: [phone]

## 2019-01-26 NOTE — Patient Instructions (Addendum)
Medication Instructions:   Your physician recommends that you continue on your current medications as directed. Please refer to the Current Medication list given to you today.  If you need a refill on your cardiac medications before your next appointment, please call your pharmacy.   Lab work:  NONE ordered at this time of appointment   If you have labs (blood work) drawn today and your tests are completely normal, you will receive your results only by: Marland Kitchen MyChart Message (if you have MyChart) OR . A paper copy in the mail If you have any lab test that is abnormal or we need to change your treatment, we will call you to review the results.  Testing/Procedures:  NONE ordered at this time of appointment   Follow-Up: At [facility], you and your health needs are our priority.  As part of our continuing mission to provide you with exceptional heart care, we have created designated Provider Care Teams.  These Care Teams include your primary Cardiologist (physician) and Advanced Practice Providers (APPs -  Physician Assistants and Nurse Practitioners) who all work together to provide you with the care you need, when you need it. You will need a follow up appointment in 4 months (November 2020).  Please call our office months in September 2020 to schedule this appointment.  You may see Peter Martinique, MD or one of the following Advanced Practice Providers on your designated Care Team: [place], [place] . Fabian Sharp, PA-C  Any Other Special Instructions Will Be Listed Below (If Applicable).

## 2019-01-28 ENCOUNTER — Encounter: Payer: Self-pay | Admitting: Physician Assistant

## 2019-02-01 ENCOUNTER — Other Ambulatory Visit: Payer: Self-pay | Admitting: Cardiology

## 2019-02-01 ENCOUNTER — Other Ambulatory Visit: Payer: Self-pay | Admitting: Obstetrics & Gynecology

## 2019-02-01 DIAGNOSIS — Z1231 Encounter for screening mammogram for malignant neoplasm of breast: Secondary | ICD-10-CM

## 2019-02-09 DIAGNOSIS — Z961 Presence of intraocular lens: Secondary | ICD-10-CM | POA: Diagnosis not present

## 2019-02-09 DIAGNOSIS — H524 Presbyopia: Secondary | ICD-10-CM | POA: Diagnosis not present

## 2019-02-09 DIAGNOSIS — H52223 Regular astigmatism, bilateral: Secondary | ICD-10-CM | POA: Diagnosis not present

## 2019-02-09 DIAGNOSIS — E119 Type 2 diabetes mellitus without complications: Secondary | ICD-10-CM | POA: Diagnosis not present

## 2019-02-09 DIAGNOSIS — D3132 Benign neoplasm of left choroid: Secondary | ICD-10-CM | POA: Diagnosis not present

## 2019-02-09 LAB — HM DIABETES EYE EXAM

## 2019-02-20 ENCOUNTER — Ambulatory Visit: Payer: Medicare Other | Admitting: Endocrinology

## 2019-02-27 ENCOUNTER — Other Ambulatory Visit: Payer: Self-pay | Admitting: Family Medicine

## 2019-03-15 ENCOUNTER — Ambulatory Visit
Admission: RE | Admit: 2019-03-15 | Discharge: 2019-03-15 | Disposition: A | Discharge status: Home / Self Care | Payer: Medicare Other | Source: Ambulatory Visit | Attending: Obstetrics & Gynecology | Admitting: Obstetrics & Gynecology

## 2019-03-15 ENCOUNTER — Other Ambulatory Visit: Payer: Self-pay

## 2019-03-15 DIAGNOSIS — Z1231 Encounter for screening mammogram for malignant neoplasm of breast: Secondary | ICD-10-CM

## 2019-03-16 ENCOUNTER — Other Ambulatory Visit: Payer: Self-pay

## 2019-03-20 ENCOUNTER — Encounter: Payer: Self-pay | Admitting: Endocrinology

## 2019-03-20 ENCOUNTER — Ambulatory Visit (INDEPENDENT_AMBULATORY_CARE_PROVIDER_SITE_OTHER): Payer: Medicare Other | Admitting: Endocrinology

## 2019-03-20 ENCOUNTER — Other Ambulatory Visit: Payer: Self-pay

## 2019-03-20 VITALS — BP 120/64 | HR 95 | Ht 64.0 in | Wt 167.8 lb

## 2019-03-20 DIAGNOSIS — E1122 Type 2 diabetes mellitus with diabetic chronic kidney disease: Secondary | ICD-10-CM

## 2019-03-20 DIAGNOSIS — N183 Chronic kidney disease, stage 3 (moderate): Secondary | ICD-10-CM

## 2019-03-20 DIAGNOSIS — Z794 Long term (current) use of insulin: Secondary | ICD-10-CM

## 2019-03-20 DIAGNOSIS — E0829 Diabetes mellitus due to underlying condition with other diabetic kidney complication: Secondary | ICD-10-CM

## 2019-03-20 DIAGNOSIS — Z9119 Patient's noncompliance with other medical treatment and regimen: Secondary | ICD-10-CM | POA: Diagnosis not present

## 2019-03-20 DIAGNOSIS — I209 Angina pectoris, unspecified: Secondary | ICD-10-CM

## 2019-03-20 DIAGNOSIS — E1159 Type 2 diabetes mellitus with other circulatory complications: Secondary | ICD-10-CM | POA: Diagnosis not present

## 2019-03-20 DIAGNOSIS — R809 Proteinuria, unspecified: Secondary | ICD-10-CM

## 2019-03-20 LAB — POCT GLYCOSYLATED HEMOGLOBIN (HGB A1C): Hemoglobin A1C: 9 % — AB (ref 4.0–5.6)

## 2019-03-20 MED ORDER — HUMULIN N KWIKPEN 100 UNIT/ML ~~LOC~~ SUPN: 40.0000 [IU] | PEN_INJECTOR | SUBCUTANEOUS | 11 refills | Status: DC

## 2019-03-20 NOTE — Patient Instructions (Addendum)
Please increase the NPH insulin to 40 units each morning.   On this type of insulin schedule, you should eat meals on a regular schedule.  If a meal is missed or significantly delayed, your blood sugar could go low.   check your blood sugar twice a day.  vary the time of day when you check, between before the 3 meals, and at bedtime.  also check if you have symptoms of your blood sugar being too high or too low.  please keep a record of the readings and bring it to your next appointment here (or you can bring the meter itself).  You can write it on any piece of paper.  please call us sooner if your blood sugar goes below 70, or if you have a lot of readings over 200.  Please come back for a follow-up appointment in 2 months.

## 2019-03-20 NOTE — Progress Notes (Signed)
Subjective:    Patient ID: Alicia Medina, female    DOB: 03/14/1933, 85 y.o.   MRN: [id number]  HPI Pt returns for f/u of diabetes mellitus:  DM type: Insulin-requiring type 2 Dx'ed: [id number] Complications: CAD, PN, and renal insuff.  Therapy: insulin since 2013.   GDM: never.  DKA: never.   Severe hypoglycemia: never.   Pancreatitis: never.  Pancreatic imaging: never Other: she declines multiple daily injections; She did not tolerate metformin (diarrhea); she changed lantus to levemir, then NPH, based on the pattern of cbg's.   Interval history: she brings a record of her cbg's which I have reviewed today.  All are fasting and at lunch.  It varies from 83-274.  She misses the insulin approx twice per month.  pt states she feels well in general.    Past Medical History:  Diagnosis Date  . Arthritis   . CAD (coronary artery disease) CARDIOLOGIST-  DR Martinique   08-28-2011 Inferior STEMI with BMS to the RCA, complicated by cardiogenic shock, VF, VDRF , temporary CHB and abrupt reocclusion of the RCA stent treated with a second BMS to the RCA. Has residual LAD disease. EF is 50%  . Diverticulosis of colon   . Hemorrhoids, internal, with bleeding   . Hyperlipidemia   . Hypertension   . Hypothyroidism, postsurgical   . S/p bare metal coronary artery stent    2013  . Type 2 diabetes mellitus ([place])   . Wears hearing aid    both ears    Past Surgical History:  Procedure Laterality Date  . CARDIOVASCULAR STRESS TEST  10-03-2011  dr Martinique   normal perfusion / no ischemia/ ef 77%  . CARPAL TUNNEL RELEASE Right 01/25/2013   Procedure: CARPAL TUNNEL RELEASE;  Surgeon: Wynonia Sours, MD;  Location: [place];  Service: Orthopedics;  Laterality: Right;  . CARPAL TUNNEL RELEASE Left 10/25/2013   Procedure: LEFT CARPAL TUNNEL RELEASE;  Surgeon: Wynonia Sours, MD;  Location: [place];  Service: Orthopedics;  Laterality: Left;  . CATARACT EXTRACTION W/ INTRAOCULAR LENS   IMPLANT, BILATERAL  2007  . COLONOSCOPY  04-27-2003  . CORONARY ANGIOGRAM Left 08/28/2011   Procedure: CORONARY ANGIOGRAM;  Surgeon: Peter M Martinique, MD;  Location: [facility] CATH LAB;  Service: Cardiovascular;  Laterality: Left;  . CORONARY ANGIOPLASTY WITH STENT PLACEMENT  08-28-2011  dr Martinique   BMS to [facility] with abrupt reocclusion treated with second BMS/  LAD ostial 90%, proximal50-60%, first diagonal 80-90%  . CORONARY STENT INTERVENTION N/A 10/11/2018   Procedure: CORONARY STENT INTERVENTION;  Surgeon: Martinique, Peter M, MD;  Location: [place] CV LAB;  Service: Cardiovascular;  Laterality: N/A;  . LEFT HEART CATH AND CORONARY ANGIOGRAPHY N/A 10/11/2018   Procedure: LEFT HEART CATH AND CORONARY ANGIOGRAPHY;  Surgeon: Martinique, Peter M, MD;  Location: [place] CV LAB;  Service: Cardiovascular;  Laterality: N/A;  . LEFT HEART CATHETERIZATION WITH CORONARY ANGIOGRAM N/A 08/28/2011   Procedure: LEFT HEART CATHETERIZATION WITH CORONARY ANGIOGRAM;  Surgeon: Peter M Martinique, MD;  Location: [facility] CATH LAB;  Service: Cardiovascular;  Laterality: N/A;  . PERCUTANEOUS CORONARY STENT INTERVENTION (PCI-S) Left 08/28/2011   Procedure: PERCUTANEOUS CORONARY STENT INTERVENTION (PCI-S);  Surgeon: Peter M Martinique, MD;  Location: [facility] CATH LAB;  Service: Cardiovascular;  Laterality: Left;  . TOTAL THYROIDECTOMY Bilateral 05-02-2007   multinodular goiter  . TRANSANAL HEMORRHOIDAL DEARTERIALIZATION N/A 03/28/2014   Procedure: TRANSANAL HEMORRHOIDAL DEARTERIALIZATION;  Surgeon: Leighton Ruff, MD;  Location: [facility];  Service: General;  Laterality: N/A;  . TRANSTHORACIC ECHOCARDIOGRAM  08-30-2011   mild LVH/ ef Q000111Q  grade I diastolic dysfunction  . TRIGGER FINGER RELEASE Right 01/25/2013   Procedure: RELEASE TRIGGER FINGER/A-1 PULLEY RIGHT INDEX FINGER;  Surgeon: Wynonia Sours, MD;  Location: [place];  Service: Orthopedics;  Laterality: Right;  . TRIGGER FINGER RELEASE Left 10/25/2013    Procedure: RELEASE A-1 PULLEY LEFT MIDDLE FINGER;  Surgeon: Wynonia Sours, MD;  Location: [place];  Service: Orthopedics;  Laterality: Left;  . TRIGGER FINGER RELEASE Left 12/04/2014   Procedure: RELEASE TRIGGER FINGER/A-1 PULLEY LEFT RING FINGER;  Surgeon: Daryll Brod, MD;  Location: [place];  Service: Orthopedics;  Laterality: Left;  . TRIGGER FINGER RELEASE Right 03/30/2017   Procedure: RELEASE TRIGGER FINGER/A-1 PULLEY RIGHT RING FINGER AND RIGHT SMALL FINGER;  Surgeon: Daryll Brod, MD;  Location: [place];  Service: Orthopedics;  Laterality: Right;    Social History   Socioeconomic History  . Marital status: Married    Spouse name: Not on file  . Number of children: Not on file  . Years of education: Not on file  . Highest education level: Not on file  Occupational History  . Not on file  Social Needs  . Financial resource strain: Not on file  . Food insecurity    Worry: Not on file    Inability: Not on file  . Transportation needs    Medical: Not on file    Non-medical: Not on file  Tobacco Use  . Smoking status: Never Smoker  . Smokeless tobacco: Never Used  Substance and Sexual Activity  . Alcohol use: Yes    Alcohol/week: 1.0 standard drinks    Types: 1 Glasses of wine per week    Comment: social  . Drug use: No  . Sexual activity: Not on file  Lifestyle  . Physical activity    Days per week: Not on file    Minutes per session: Not on file  . Stress: Not on file  Relationships  . Social Herbalist on phone: Not on file    Gets together: Not on file    Attends religious service: Not on file    Active member of club or organization: Not on file    Attends meetings of clubs or organizations: Not on file    Relationship status: Not on file  . Intimate partner violence    Fear of current or ex partner: Not on file    Emotionally abused: Not on file    Physically abused: Not on file    Forced sexual  activity: Not on file  Other Topics Concern  . Not on file  Social History Narrative   Lives with husband.   Has 2 sons- both healthy.   Younger son lives in [place], [place] in order son lives in [place].    Current Outpatient Medications on File Prior to Visit  Medication Sig Dispense Refill  . acetaminophen (TYLENOL) 500 MG tablet Take 1,000 mg by mouth at bedtime.    . Ascorbic Acid (VITAMIN C) 1000 MG tablet Take 1,000 mg by mouth every evening.    Marland Kitchen aspirin EC 81 MG tablet Take 81 mg by mouth daily.    . Biotin w/ Vitamins C & E (HAIR/SKIN/NAILS PO) Take 1 tablet by mouth 2 (two) times daily.    Marland Kitchen bismuth subsalicylate (PEPTO BISMOL) 262 MG chewable tablet Chew  131-262 mg by mouth 4 (four) times daily as needed for diarrhea or loose stools.    . calcium carbonate (CALCIUM 600) 1500 (600 Ca) MG TABS tablet Take 600 mg of elemental calcium by mouth once a week.     . Cholecalciferol (VITAMIN D-3) 125 MCG (5000 UT) TABS Take 5,000 Units by mouth daily.    . Chromium Picolinate 1000 MCG TABS Take 1,000 mcg by mouth daily.    . clopidogrel (PLAVIX) 75 MG tablet TAKE 1 TABLET (75 MG TOTAL) BY MOUTH DAILY. 90 tablet 3  . Coenzyme Q10 200 MG TABS Take 200 mg by mouth daily.     . COLLAGEN PO Take 1,000 mg by mouth 2 (two) times daily.    . folic acid (FOLVITE) Q000111Q MCG tablet Take 800 mcg by mouth daily.    Marland Kitchen glucose blood (TRUE METRIX BLOOD GLUCOSE TEST) test strip 1 each by Other route 2 (two) times daily. And lancets 2/day 200 each 3  . loperamide (IMODIUM A-D) 2 MG tablet Take 1-2 mg by mouth 4 (four) times daily as needed for diarrhea or loose stools.    Marland Kitchen loratadine (CLARITIN) 10 MG tablet Take 10 mg by mouth at bedtime.    Marland Kitchen losartan (COZAAR) 25 MG tablet Take 1 tablet (25 mg total) by mouth every morning. 90 tablet 3  . Magnesium 250 MG TABS Take 250 mg by mouth daily.    . metoprolol tartrate (LOPRESSOR) 50 MG tablet TAKE 1 TABLET BY MOUTH 2 TIMES DAILY. 180 tablet 1  . Multiple  Vitamin (MULTIVITAMIN) tablet Take 1 tablet by mouth once a week.     . potassium chloride SA (K-DUR,KLOR-CON) 20 MEQ tablet TAKE 1 TABLET BY MOUTH DAILY. 90 tablet 1  . Psyllium (METAMUCIL PO) Take 1 capsule by mouth daily.    . rosuvastatin (CRESTOR) 20 MG tablet TAKE 1 TABLET BY MOUTH DAILY. 90 tablet 1  . SYNTHROID 150 MCG tablet TAKE 1 TABLET BY MOUTH DAILY BEFORE BREAKFAST. 90 tablet 0  . vitamin B-12 (CYANOCOBALAMIN) 500 MCG tablet Take 500 mcg by mouth daily.    Marland Kitchen VITAMIN E PO Take 500 mg by mouth 2 (two) times a week.      No current facility-administered medications on file prior to visit.     Allergies  Allergen Reactions  . Demeclocycline Other (See Comments)    Severe stomach cramps Severe stomach cramps Severe stomach cramps  . Lisinopril Cough  . Tetracyclines & Related Other (See Comments)    Severe stomach cramps    Family History  Problem Relation Age of Onset  . Stroke Mother   . Cancer Mother        breast  . Cancer Father        lung  . Stroke Father   . Diabetes Neg Hx     BP 120/64 (BP Location: Left Arm, Patient Position: Sitting, Cuff Size: Normal)   Pulse 95   Ht 5\' 4"  (1.626 m)   Wt 167 lb 12.8 oz (76.1 kg)   SpO2 93%   BMI 28.80 kg/m    Review of Systems She denies hypoglycemia.      Objective:   Physical Exam VITAL SIGNS:  See vs page GENERAL: no distress Pulses: dorsalis pedis intact bilat.   MSK: no deformity of the feet CV: no leg edema Skin:  no ulcer on the feet.  normal color and temp on the feet. Neuro: sensation is intact to touch on the feet, but decreased  from normal.    Ext: there is bilateral onychomycosis of the toenails.    Lab Results  Component Value Date   HGBA1C 9.0 (A) 03/20/2019    Lab Results  Component Value Date   CREATININE 1.10 (H) 10/05/2018   BUN 18 10/05/2018   NA 140 10/05/2018   K 4.8 10/05/2018   CL 103 10/05/2018   CO2 22 10/05/2018       Assessment & Plan:  Insulin-requiring type 2  DM, with CAD: worse Noncompliance with cbg recording at different times of day.  In this setting, we need to increase insulin slowly.  Patient Instructions  Please increase the NPH insulin to 40 units each morning.   On this type of insulin schedule, you should eat meals on a regular schedule.  If a meal is missed or significantly delayed, your blood sugar could go low.   check your blood sugar twice a day.  vary the time of day when you check, between before the 3 meals, and at bedtime.  also check if you have symptoms of your blood sugar being too high or too low.  please keep a record of the readings and bring it to your next appointment here (or you can bring the meter itself).  You can write it on any piece of paper.  please call us sooner if your blood sugar goes below 70, or if you have a lot of readings over 200.  Please come back for a follow-up appointment in 2 months.

## 2019-03-22 DIAGNOSIS — N819 Female genital prolapse, unspecified: Secondary | ICD-10-CM | POA: Diagnosis not present

## 2019-03-22 DIAGNOSIS — N390 Urinary tract infection, site not specified: Secondary | ICD-10-CM | POA: Diagnosis not present

## 2019-05-15 DIAGNOSIS — N390 Urinary tract infection, site not specified: Secondary | ICD-10-CM | POA: Diagnosis not present

## 2019-05-15 DIAGNOSIS — Z6830 Body mass index (BMI) 30.0-30.9, adult: Secondary | ICD-10-CM | POA: Diagnosis not present

## 2019-05-15 DIAGNOSIS — Z01419 Encounter for gynecological examination (general) (routine) without abnormal findings: Secondary | ICD-10-CM | POA: Diagnosis not present

## 2019-05-18 ENCOUNTER — Other Ambulatory Visit: Payer: Self-pay

## 2019-05-18 ENCOUNTER — Encounter: Payer: Self-pay | Admitting: Endocrinology

## 2019-05-18 ENCOUNTER — Ambulatory Visit (INDEPENDENT_AMBULATORY_CARE_PROVIDER_SITE_OTHER): Payer: Medicare Other | Admitting: Endocrinology

## 2019-05-18 VITALS — BP 140/76 | HR 92 | Ht 64.0 in | Wt 169.8 lb

## 2019-05-18 DIAGNOSIS — Z794 Long term (current) use of insulin: Secondary | ICD-10-CM | POA: Diagnosis not present

## 2019-05-18 DIAGNOSIS — Z9119 Patient's noncompliance with other medical treatment and regimen: Secondary | ICD-10-CM | POA: Diagnosis not present

## 2019-05-18 DIAGNOSIS — R809 Proteinuria, unspecified: Secondary | ICD-10-CM

## 2019-05-18 DIAGNOSIS — E119 Type 2 diabetes mellitus without complications: Secondary | ICD-10-CM

## 2019-05-18 DIAGNOSIS — E0829 Diabetes mellitus due to underlying condition with other diabetic kidney complication: Secondary | ICD-10-CM

## 2019-05-18 DIAGNOSIS — I209 Angina pectoris, unspecified: Secondary | ICD-10-CM

## 2019-05-18 LAB — POCT GLYCOSYLATED HEMOGLOBIN (HGB A1C): Hemoglobin A1C: 8.4 % — AB (ref 4.0–5.6)

## 2019-05-18 MED ORDER — HUMULIN N KWIKPEN 100 UNIT/ML ~~LOC~~ SUPN: 42.0000 [IU] | PEN_INJECTOR | SUBCUTANEOUS | 11 refills | Status: DC

## 2019-05-18 NOTE — Patient Instructions (Addendum)
Your blood pressure is high today.  Please see your primary care provider soon, to have it rechecked Please increase the NPH insulin to 42 units each morning.   On this type of insulin schedule, you should eat meals on a regular schedule.  If a meal is missed or significantly delayed, your blood sugar could go low.  check your blood sugar twice a day.  vary the time of day when you check, between before the 3 meals, and at bedtime.  also check if you have symptoms of your blood sugar being too high or too low.  please keep a record of the readings and bring it to your next appointment here (or you can bring the meter itself).  You can write it on any piece of paper.  please call us sooner if your blood sugar goes below 70, or if you have a lot of readings over 200.  Please come back for a follow-up appointment in 3 months.

## 2019-05-18 NOTE — Progress Notes (Signed)
Subjective:    Patient ID: Alicia Medina, female    DOB: [birth year], 85 y.o.   MRN: [id number]  HPI Pt returns for f/u of diabetes mellitus:  DM type: Insulin-requiring type 2 Dx'ed: [id number] Complications: CAD, PN, and renal insuff.  Therapy: insulin since 2013.   GDM: never.  DKA: never.   Severe hypoglycemia: never.   Pancreatitis: never.  Pancreatic imaging: never Other: she declines multiple daily injections; She did not tolerate metformin (diarrhea); she changed lantus to levemir, then NPH, based on the pattern of cbg's.   Interval history: she brings a record of her cbg's which I have reviewed today.  cbg varies from 93-299.  There is no trend throughout the day.  She seldom misses the insulin, but she sometimes forgets until later in the day.  pt states she feels well in general.  Past Medical History:  Diagnosis Date  . Arthritis   . CAD (coronary artery disease) CARDIOLOGIST-  DR Martinique   08-28-2011 Inferior STEMI with BMS to the RCA, complicated by cardiogenic shock, VF, VDRF , temporary CHB and abrupt reocclusion of the RCA stent treated with a second BMS to the RCA. Has residual LAD disease. EF is 50%  . Diverticulosis of colon   . Hemorrhoids, internal, with bleeding   . Hyperlipidemia   . Hypertension   . Hypothyroidism, postsurgical   . S/p bare metal coronary artery stent    2013  . Type 2 diabetes mellitus ([place])   . Wears hearing aid    both ears    Past Surgical History:  Procedure Laterality Date  . CARDIOVASCULAR STRESS TEST  10-03-2011  dr Martinique   normal perfusion / no ischemia/ ef 77%  . CARPAL TUNNEL RELEASE Right 01/25/2013   Procedure: CARPAL TUNNEL RELEASE;  Surgeon: Wynonia Sours, MD;  Location: [place];  Service: Orthopedics;  Laterality: Right;  . CARPAL TUNNEL RELEASE Left 10/25/2013   Procedure: LEFT CARPAL TUNNEL RELEASE;  Surgeon: Wynonia Sours, MD;  Location: [place];  Service: Orthopedics;  Laterality: Left;   . CATARACT EXTRACTION W/ INTRAOCULAR LENS  IMPLANT, BILATERAL  2007  . COLONOSCOPY  04-27-2003  . CORONARY ANGIOGRAM Left 08/28/2011   Procedure: CORONARY ANGIOGRAM;  Surgeon: Peter M Martinique, MD;  Location: [facility] CATH LAB;  Service: Cardiovascular;  Laterality: Left;  . CORONARY ANGIOPLASTY WITH STENT PLACEMENT  08-28-2011  dr Martinique   BMS to [facility] with abrupt reocclusion treated with second BMS/  LAD ostial 90%, proximal50-60%, first diagonal 80-90%  . CORONARY STENT INTERVENTION N/A 10/11/2018   Procedure: CORONARY STENT INTERVENTION;  Surgeon: Martinique, Peter M, MD;  Location: [place] CV LAB;  Service: Cardiovascular;  Laterality: N/A;  . LEFT HEART CATH AND CORONARY ANGIOGRAPHY N/A 10/11/2018   Procedure: LEFT HEART CATH AND CORONARY ANGIOGRAPHY;  Surgeon: Martinique, Peter M, MD;  Location: [place] CV LAB;  Service: Cardiovascular;  Laterality: N/A;  . LEFT HEART CATHETERIZATION WITH CORONARY ANGIOGRAM N/A 08/28/2011   Procedure: LEFT HEART CATHETERIZATION WITH CORONARY ANGIOGRAM;  Surgeon: Peter M Martinique, MD;  Location: [facility] CATH LAB;  Service: Cardiovascular;  Laterality: N/A;  . PERCUTANEOUS CORONARY STENT INTERVENTION (PCI-S) Left 08/28/2011   Procedure: PERCUTANEOUS CORONARY STENT INTERVENTION (PCI-S);  Surgeon: Peter M Martinique, MD;  Location: [facility] CATH LAB;  Service: Cardiovascular;  Laterality: Left;  . TOTAL THYROIDECTOMY Bilateral 05-02-2007   multinodular goiter  . TRANSANAL HEMORRHOIDAL DEARTERIALIZATION N/A 03/28/2014   Procedure: TRANSANAL HEMORRHOIDAL DEARTERIALIZATION;  Surgeon: Leighton Ruff,  MD;  Location: [place];  Service: General;  Laterality: N/A;  . TRANSTHORACIC ECHOCARDIOGRAM  08-30-2011   mild LVH/ ef Q000111Q  grade I diastolic dysfunction  . TRIGGER FINGER RELEASE Right 01/25/2013   Procedure: RELEASE TRIGGER FINGER/A-1 PULLEY RIGHT INDEX FINGER;  Surgeon: Wynonia Sours, MD;  Location: [place];  Service: Orthopedics;  Laterality: Right;  .  TRIGGER FINGER RELEASE Left 10/25/2013   Procedure: RELEASE A-1 PULLEY LEFT MIDDLE FINGER;  Surgeon: Wynonia Sours, MD;  Location: [place];  Service: Orthopedics;  Laterality: Left;  . TRIGGER FINGER RELEASE Left 12/04/2014   Procedure: RELEASE TRIGGER FINGER/A-1 PULLEY LEFT RING FINGER;  Surgeon: Daryll Brod, MD;  Location: [place];  Service: Orthopedics;  Laterality: Left;  . TRIGGER FINGER RELEASE Right 03/30/2017   Procedure: RELEASE TRIGGER FINGER/A-1 PULLEY RIGHT RING FINGER AND RIGHT SMALL FINGER;  Surgeon: Daryll Brod, MD;  Location: [place];  Service: Orthopedics;  Laterality: Right;    Social History   Socioeconomic History  . Marital status: Married    Spouse name: Not on file  . Number of children: Not on file  . Years of education: Not on file  . Highest education level: Not on file  Occupational History  . Not on file  Social Needs  . Financial resource strain: Not on file  . Food insecurity    Worry: Not on file    Inability: Not on file  . Transportation needs    Medical: Not on file    Non-medical: Not on file  Tobacco Use  . Smoking status: Never Smoker  . Smokeless tobacco: Never Used  Substance and Sexual Activity  . Alcohol use: Yes    Alcohol/week: 1.0 standard drinks    Types: 1 Glasses of wine per week    Comment: social  . Drug use: No  . Sexual activity: Not on file  Lifestyle  . Physical activity    Days per week: Not on file    Minutes per session: Not on file  . Stress: Not on file  Relationships  . Social Herbalist on phone: Not on file    Gets together: Not on file    Attends religious service: Not on file    Active member of club or organization: Not on file    Attends meetings of clubs or organizations: Not on file    Relationship status: Not on file  . Intimate partner violence    Fear of current or ex partner: Not on file    Emotionally abused: Not on file    Physically  abused: Not on file    Forced sexual activity: Not on file  Other Topics Concern  . Not on file  Social History Narrative   Lives with husband.   Has 2 sons- both healthy.   Younger son lives in [place], [place] in order son lives in [place].    Current Outpatient Medications on File Prior to Visit  Medication Sig Dispense Refill  . acetaminophen (TYLENOL) 500 MG tablet Take 1,000 mg by mouth at bedtime.    . Ascorbic Acid (VITAMIN C) 1000 MG tablet Take 1,000 mg by mouth every evening.    Marland Kitchen aspirin EC 81 MG tablet Take 81 mg by mouth daily.    . Biotin w/ Vitamins C & E (HAIR/SKIN/NAILS PO) Take 1 tablet by mouth 2 (two) times daily.    Marland Kitchen bismuth subsalicylate (PEPTO BISMOL)  262 MG chewable tablet Chew 131-262 mg by mouth 4 (four) times daily as needed for diarrhea or loose stools.    . calcium carbonate (CALCIUM 600) 1500 (600 Ca) MG TABS tablet Take 600 mg of elemental calcium by mouth once a week.     . Cholecalciferol (VITAMIN D-3) 125 MCG (5000 UT) TABS Take 5,000 Units by mouth daily.    . Chromium Picolinate 1000 MCG TABS Take 1,000 mcg by mouth daily.    . clopidogrel (PLAVIX) 75 MG tablet TAKE 1 TABLET (75 MG TOTAL) BY MOUTH DAILY. 90 tablet 3  . Coenzyme Q10 200 MG TABS Take 200 mg by mouth daily.     . COLLAGEN PO Take 1,000 mg by mouth 2 (two) times daily.    . folic acid (FOLVITE) Q000111Q MCG tablet Take 800 mcg by mouth daily.    Marland Kitchen glucose blood (TRUE METRIX BLOOD GLUCOSE TEST) test strip 1 each by Other route 2 (two) times daily. And lancets 2/day 200 each 3  . loperamide (IMODIUM A-D) 2 MG tablet Take 1-2 mg by mouth 4 (four) times daily as needed for diarrhea or loose stools.    Marland Kitchen loratadine (CLARITIN) 10 MG tablet Take 10 mg by mouth at bedtime.    Marland Kitchen losartan (COZAAR) 25 MG tablet Take 1 tablet (25 mg total) by mouth every morning. 90 tablet 3  . Magnesium 250 MG TABS Take 250 mg by mouth daily.    . metoprolol tartrate (LOPRESSOR) 50 MG tablet TAKE 1 TABLET BY MOUTH 2 TIMES  DAILY. 180 tablet 1  . Multiple Vitamin (MULTIVITAMIN) tablet Take 1 tablet by mouth once a week.     . potassium chloride SA (K-DUR,KLOR-CON) 20 MEQ tablet TAKE 1 TABLET BY MOUTH DAILY. 90 tablet 1  . Psyllium (METAMUCIL PO) Take 1 capsule by mouth daily.    . rosuvastatin (CRESTOR) 20 MG tablet TAKE 1 TABLET BY MOUTH DAILY. 90 tablet 1  . SYNTHROID 150 MCG tablet TAKE 1 TABLET BY MOUTH DAILY BEFORE BREAKFAST. 90 tablet 0  . vitamin B-12 (CYANOCOBALAMIN) 500 MCG tablet Take 500 mcg by mouth daily.    Marland Kitchen VITAMIN E PO Take 500 mg by mouth 2 (two) times a week.      No current facility-administered medications on file prior to visit.     Allergies  Allergen Reactions  . Demeclocycline Other (See Comments)    Severe stomach cramps Severe stomach cramps Severe stomach cramps  . Lisinopril Cough  . Tetracyclines & Related Other (See Comments)    Severe stomach cramps    Family History  Problem Relation Age of Onset  . Stroke Mother   . Cancer Mother        breast  . Cancer Father        lung  . Stroke Father   . Diabetes Neg Hx     BP 140/76 (BP Location: Left Arm, Patient Position: Sitting, Cuff Size: Normal)   Pulse 92   Ht 5\' 4"  (1.626 m)   Wt 169 lb 12.8 oz (77 kg)   SpO2 95%   BMI 29.15 kg/m    Review of Systems She denies hypoglycemia    Objective:   Physical Exam VITAL SIGNS:  See vs page GENERAL: no distress Pulses: dorsalis pedis intact bilat.   MSK: no deformity of the feet CV: trace bilat leg edema, and bilat vv's Skin:  no ulcer on the feet.  normal color and temp on the feet. Neuro: sensation is intact  to touch on the feet   Lab Results  Component Value Date   HGBA1C 8.4 (A) 05/18/2019   Lab Results  Component Value Date   CREATININE 1.10 (H) 10/05/2018   BUN 18 10/05/2018   NA 140 10/05/2018   K 4.8 10/05/2018   CL 103 10/05/2018   CO2 22 10/05/2018       Assessment & Plan:  HTN: is noted today Insulin-requiring type 2 DM: she needs  increased rx Noncompliance with insulin timing: we discussed the need to take in the morning.   Patient Instructions  Your blood pressure is high today.  Please see your primary care provider soon, to have it rechecked Please increase the NPH insulin to 42 units each morning.   On this type of insulin schedule, you should eat meals on a regular schedule.  If a meal is missed or significantly delayed, your blood sugar could go low.  check your blood sugar twice a day.  vary the time of day when you check, between before the 3 meals, and at bedtime.  also check if you have symptoms of your blood sugar being too high or too low.  please keep a record of the readings and bring it to your next appointment here (or you can bring the meter itself).  You can write it on any piece of paper.  please call us sooner if your blood sugar goes below 70, or if you have a lot of readings over 200.  Please come back for a follow-up appointment in 3 months.

## 2019-05-22 DIAGNOSIS — K641 Second degree hemorrhoids: Secondary | ICD-10-CM | POA: Diagnosis not present

## 2019-05-22 DIAGNOSIS — Z23 Encounter for immunization: Secondary | ICD-10-CM | POA: Diagnosis not present

## 2019-05-25 DIAGNOSIS — I10 Essential (primary) hypertension: Secondary | ICD-10-CM | POA: Diagnosis not present

## 2019-05-25 DIAGNOSIS — E1159 Type 2 diabetes mellitus with other circulatory complications: Secondary | ICD-10-CM | POA: Diagnosis not present

## 2019-05-25 NOTE — Progress Notes (Signed)
Cardiology Office Note    Date:  05/29/2019   ID:  Alicia Medina, DOB 11-15-1932, MRN [id number]  PCP:  Mellody Dance, DO  Cardiologist:  Dr. Martinique   Chief Complaint  Patient presents with  . Coronary Artery Disease  . Hypertension    History of Present Illness:  Alicia Medina is a 85 y.o. female with PMH of CAD, HTN, HLD, hypothyroidism, and DM II. patient had a inferior MI treated with BMS to RCA in 08/2011.  She required defibrillation multiple times and her hospitalization was complicated by cardiogenic shock, V. fib, VDRF, and complete heart block requiring temporary pacemaker.  Unfortunately, she had abrupt occlusion of the RCA stent and was treated with a second BMS to RCA.  Follow-up Myoview in March 2013 showed normal perfusion with EF of 76%.  Patient was seen in Feb 2020 with  intermittent chest pain radiating to her back between her shoulder blades.  Myoview showed moderate perfusion defect in the inferoapex, lateral apex, and and her apex that is new when compared to 2013.  The study was interpreted as intermediate risk with EF of 63%.  Patient ultimately underwent elective cardiac catheterization on 10/11/2018 which revealed a 100% proximal to mid LAD occlusion which was treated with 2.75 x 28 mm Synergy DES, widely patent mid RCA stent.   On follow up today she is doing well. 2 weeks ago she noted some discomfort under her breasts bilaterally. This was quite different from her prior anginal pain. No SOB. Needs Rx refills. No edema or palpitations.   Past Medical History:  Diagnosis Date  . Arthritis   . CAD (coronary artery disease) CARDIOLOGIST-  DR Martinique   08-28-2011 Inferior STEMI with BMS to the RCA, complicated by cardiogenic shock, VF, VDRF , temporary CHB and abrupt reocclusion of the RCA stent treated with a second BMS to the RCA. Has residual LAD disease. EF is 50%  . Diverticulosis of colon   . Hemorrhoids, internal, with bleeding   . Hyperlipidemia   .  Hypertension   . Hypothyroidism, postsurgical   . S/p bare metal coronary artery stent    2013  . Type 2 diabetes mellitus ([place])   . Wears hearing aid    both ears    Past Surgical History:  Procedure Laterality Date  . CARDIOVASCULAR STRESS TEST  10-03-2011  dr Martinique   normal perfusion / no ischemia/ ef 77%  . CARPAL TUNNEL RELEASE Right 01/25/2013   Procedure: CARPAL TUNNEL RELEASE;  Surgeon: Wynonia Sours, MD;  Location: [facility];  Service: Orthopedics;  Laterality: Right;  . CARPAL TUNNEL RELEASE Left 10/25/2013   Procedure: LEFT CARPAL TUNNEL RELEASE;  Surgeon: Wynonia Sours, MD;  Location: [place] [facility];  Service: Orthopedics;  Laterality: Left;  . CATARACT EXTRACTION W/ INTRAOCULAR LENS  IMPLANT, BILATERAL  2007  . COLONOSCOPY  04-27-2003  . CORONARY ANGIOGRAM Left 08/28/2011   Procedure: CORONARY ANGIOGRAM;  Surgeon: Halsey Persaud M Martinique, MD;  Location: [facility] CATH LAB;  Service: Cardiovascular;  Laterality: Left;  . CORONARY ANGIOPLASTY WITH STENT PLACEMENT  08-28-2011  dr Martinique   BMS to [facility] with abrupt reocclusion treated with second BMS/  LAD ostial 90%, proximal50-60%, first diagonal 80-90%  . CORONARY STENT INTERVENTION N/A 10/11/2018   Procedure: CORONARY STENT INTERVENTION;  Surgeon: Martinique, Harlei Lehrmann M, MD;  Location: [place] CV LAB;  Service: Cardiovascular;  Laterality: N/A;  . LEFT HEART CATH AND CORONARY ANGIOGRAPHY N/A 10/11/2018  Procedure: LEFT HEART CATH AND CORONARY ANGIOGRAPHY;  Surgeon: Martinique, Ryli Standlee M, MD;  Location: [place] CV LAB;  Service: Cardiovascular;  Laterality: N/A;  . LEFT HEART CATHETERIZATION WITH CORONARY ANGIOGRAM N/A 08/28/2011   Procedure: LEFT HEART CATHETERIZATION WITH CORONARY ANGIOGRAM;  Surgeon: Rocky Gladden M Martinique, MD;  Location: [facility] CATH LAB;  Service: Cardiovascular;  Laterality: N/A;  . PERCUTANEOUS CORONARY STENT INTERVENTION (PCI-S) Left 08/28/2011   Procedure: PERCUTANEOUS CORONARY STENT INTERVENTION (PCI-S);  Surgeon:  Blyss Lugar M Martinique, MD;  Location: [facility] CATH LAB;  Service: Cardiovascular;  Laterality: Left;  . TOTAL THYROIDECTOMY Bilateral 05-02-2007   multinodular goiter  . TRANSANAL HEMORRHOIDAL DEARTERIALIZATION N/A 03/28/2014   Procedure: TRANSANAL HEMORRHOIDAL DEARTERIALIZATION;  Surgeon: Leighton Ruff, MD;  Location: [facility];  Service: General;  Laterality: N/A;  . TRANSTHORACIC ECHOCARDIOGRAM  08-30-2011   mild LVH/ ef [id number]  grade I diastolic dysfunction  . TRIGGER FINGER RELEASE Right 01/25/2013   Procedure: RELEASE TRIGGER FINGER/A-1 PULLEY RIGHT INDEX FINGER;  Surgeon: Wynonia Sours, MD;  Location: [place];  Service: Orthopedics;  Laterality: Right;  . TRIGGER FINGER RELEASE Left 10/25/2013   Procedure: RELEASE A-1 PULLEY LEFT MIDDLE FINGER;  Surgeon: Wynonia Sours, MD;  Location: [place];  Service: Orthopedics;  Laterality: Left;  . TRIGGER FINGER RELEASE Left 12/04/2014   Procedure: RELEASE TRIGGER FINGER/A-1 PULLEY LEFT RING FINGER;  Surgeon: Daryll Brod, MD;  Location: [place];  Service: Orthopedics;  Laterality: Left;  . TRIGGER FINGER RELEASE Right 03/30/2017   Procedure: RELEASE TRIGGER FINGER/A-1 PULLEY RIGHT RING FINGER AND RIGHT SMALL FINGER;  Surgeon: Daryll Brod, MD;  Location: [place];  Service: Orthopedics;  Laterality: Right;    Current Medications: Outpatient Medications Prior to Visit  Medication Sig Dispense Refill  . acetaminophen (TYLENOL) 500 MG tablet Take 1,000 mg by mouth at bedtime.    . Ascorbic Acid (VITAMIN C) 1000 MG tablet Take 1,000 mg by mouth every evening.    Marland Kitchen aspirin EC 81 MG tablet Take 81 mg by mouth daily.    . Biotin w/ Vitamins C & E (HAIR/SKIN/NAILS PO) Take 1 tablet by mouth 2 (two) times daily.    Marland Kitchen bismuth subsalicylate (PEPTO BISMOL) 262 MG chewable tablet Chew 131-262 mg by mouth as directed.     . calcium carbonate (CALCIUM 600) 1500 (600 Ca) MG TABS tablet Take 600 mg  of elemental calcium by mouth once a week.     . Cholecalciferol (VITAMIN D-3) 125 MCG (5000 UT) TABS Take 5,000 Units by mouth daily.    . Chromium Picolinate 1000 MCG TABS Take 1,000 mcg by mouth daily.    . Coenzyme Q10 200 MG TABS Take 200 mg by mouth daily.     . COLLAGEN PO Take 1,000 mg by mouth 2 (two) times daily.    . folic acid (FOLVITE) Q000111Q MCG tablet Take 800 mcg by mouth daily.    Marland Kitchen glucose blood (TRUE METRIX BLOOD GLUCOSE TEST) test strip 1 each by Other route 2 (two) times daily. And lancets 2/day 200 each 3  . Insulin NPH, Human,, Isophane, (HUMULIN N KWIKPEN) 100 UNIT/ML Kiwkpen Inject 42 Units into the skin every morning. 15 mL 11  . loperamide (IMODIUM A-D) 2 MG tablet Take 1-2 mg by mouth as directed.     . loratadine (CLARITIN) 10 MG tablet Take 10 mg by mouth at bedtime.    . Magnesium 250 MG TABS Take 250 mg by mouth  daily.    . Multiple Vitamin (MULTIVITAMIN) tablet Take 1 tablet by mouth once a week.     . Psyllium (METAMUCIL PO) Take 1 capsule by mouth daily.    Marland Kitchen SYNTHROID 150 MCG tablet TAKE 1 TABLET BY MOUTH DAILY BEFORE BREAKFAST. 90 tablet 0  . vitamin B-12 (CYANOCOBALAMIN) 500 MCG tablet Take 500 mcg by mouth daily.    Marland Kitchen VITAMIN E PO Take 500 mg by mouth 2 (two) times a week.     . clopidogrel (PLAVIX) 75 MG tablet TAKE 1 TABLET (75 MG TOTAL) BY MOUTH DAILY. 90 tablet 3  . losartan (COZAAR) 25 MG tablet Take 1 tablet (25 mg total) by mouth every morning. 90 tablet 3  . metoprolol tartrate (LOPRESSOR) 50 MG tablet TAKE 1 TABLET BY MOUTH 2 TIMES DAILY. 60 tablet 0  . potassium chloride SA (KLOR-CON) 20 MEQ tablet TAKE 1 TABLET BY MOUTH DAILY. 90 tablet 0  . rosuvastatin (CRESTOR) 20 MG tablet TAKE 1 TABLET BY MOUTH DAILY. 30 tablet 0   No facility-administered medications prior to visit.      Allergies:   Demeclocycline, Lisinopril, and Tetracyclines & related   Social History   Socioeconomic History  . Marital status: Married    Spouse name: Not on file   . Number of children: Not on file  . Years of education: Not on file  . Highest education level: Not on file  Occupational History  . Not on file  Social Needs  . Financial resource strain: Not on file  . Food insecurity    Worry: Not on file    Inability: Not on file  . Transportation needs    Medical: Not on file    Non-medical: Not on file  Tobacco Use  . Smoking status: Never Smoker  . Smokeless tobacco: Never Used  Substance and Sexual Activity  . Alcohol use: Yes    Alcohol/week: 1.0 standard drinks    Types: 1 Glasses of wine per week    Comment: social  . Drug use: No  . Sexual activity: Not on file  Lifestyle  . Physical activity    Days per week: Not on file    Minutes per session: Not on file  . Stress: Not on file  Relationships  . Social Herbalist on phone: Not on file    Gets together: Not on file    Attends religious service: Not on file    Active member of club or organization: Not on file    Attends meetings of clubs or organizations: Not on file    Relationship status: Not on file  Other Topics Concern  . Not on file  Social History Narrative   Lives with husband.   Has 2 sons- both healthy.   Younger son lives in [place], [place] in order son lives in [place].     Family History:  The patient's family history includes Cancer in her father and mother; Stroke in her father and mother.   ROS:   Please see the history of present illness.    ROS All other systems reviewed and are negative.   PHYSICAL EXAM:   VS:  BP (!) 158/76   Pulse 87   Ht 5' 2.5" (1.588 m)   Wt 170 lb 3.2 oz (77.2 kg)   SpO2 99%   BMI 30.63 kg/m    GEN: Well nourished, well developed, in no acute distress  HEENT: normal  Neck: no JVD, carotid bruits, or  masses Cardiac: RRR; no murmurs, rubs, or gallops,no edema  Respiratory:  clear to auscultation bilaterally, normal work of breathing GI: soft, nontender, nondistended, + BS MS: no deformity or atrophy  Skin:  warm and dry, no rash Neuro:  Alert and Oriented x 3, Strength and sensation are intact Psych: euthymic mood, full affect  Wt Readings from Last 3 Encounters:  05/29/19 170 lb 3.2 oz (77.2 kg)  05/18/19 169 lb 12.8 oz (77 kg)  03/20/19 167 lb 12.8 oz (76.1 kg)      Studies/Labs Reviewed:   EKG:  EKG is not ordered today.    Recent Labs: 09/02/2018: TSH 3.800 10/05/2018: Hemoglobin 12.1; Platelets 267 05/25/2019: ALT 15; BUN 12; Creatinine, Ser 1.11; Potassium 4.8; Sodium 139   Lipid Panel    Component Value Date/Time   CHOL 122 05/25/2019 1420   TRIG 121 05/25/2019 1420   HDL 47 05/25/2019 1420   CHOLHDL 2.6 05/25/2019 1420   CHOLHDL 2.6 12/10/2016 0954   VLDL 26 12/10/2016 0954   LDLCALC 53 05/25/2019 1420    Additional studies/ records that were reviewed today include:   Myoview 09/22/18: Study Highlights    The left ventricular ejection fraction is normal (55-65%).  Nuclear stress EF: 63%.  There was no ST segment deviation noted during stress.  Defect 1: There is a medium size severe perfusion defect present in the apical anterior, apical inferior, apical lateral and apex location.  Defect 2: There is a small defect of severe severity present in the mid anterior location.  Findings consistent with prior myocardial infarction.  This is an intermediate risk study.   Medium size severe perfusion defect in the inferoapex, lateral apex and anteroapex, fixed without evidence of ischemia.  Small fixed perfusion defect in the anterior wall at the mid ventricle.  Evidence of infarction without evidence of ischemia.     Cath 10/11/2018  Prox LAD to Mid LAD lesion is 100% stenosed.  Previously placed Mid RCA stent (unknown type) is widely patent.  Post intervention, there is a 0% residual stenosis.  A drug-eluting stent was successfully placed using a STENT SYNERGY DES [id number].  LV end diastolic pressure is normal.   1. Single vessel occlusive CAD with CTO  of the mid LAD 2. Continued patency of stents in the RCA 3. Normal LVEDP 4. Successful CTO PCI of the mid LAD with DES x 1  Plan: DAPT for one year. Patient is a candidate for same day discharge.     ASSESSMENT:    1. Coronary artery disease involving native coronary artery of native heart without angina pectoris   2. Essential hypertension   3. Dyslipidemia   4. Type 2 diabetes mellitus without complication, with long-term current use of insulin (HCC)      PLAN:  In order of problems listed above:  1. CAD: S/p remote BMS of RCA. More recent DES of mid LAD in March 2020. Continue DAPT for at least one year. Some recent atypical chest pain that does not sound anginal.  2. Hypertension: BP elevated today but this is unusual. Typically BP well controlled.   3. Hyperlipidemia: Continue statin therapy, Crestor 20 mg daily. LDL 53.   4. DM2: Managed by Dr Loanne Drilling  5. Hypothyroidism: On Synthroid  Medications refilled today. Follow up in 6 months.  Medication Adjustments/Labs and Tests Ordered: Current medicines are reviewed at length with the patient today.  Concerns regarding medicines are outlined above.  Medication changes, Labs and Tests ordered today are listed  in the Patient Instructions below. There are no Patient Instructions on file for this visit.   Signed, Shainna Faux Martinique, MD  05/29/2019 10:45 AM    [address], [place], [place]  [address] Phone: [phone]; Fax: [phone]

## 2019-05-26 ENCOUNTER — Other Ambulatory Visit: Payer: Self-pay | Admitting: Cardiology

## 2019-05-26 LAB — BASIC METABOLIC PANEL
BUN/Creatinine Ratio: 11 — ABNORMAL LOW (ref 12–28)
BUN: 12 mg/dL (ref 8–27)
CO2: 22 mmol/L (ref 20–29)
Calcium: 9.1 mg/dL (ref 8.7–10.3)
Chloride: 103 mmol/L (ref 96–106)
Creatinine, Ser: 1.11 mg/dL — ABNORMAL HIGH (ref 0.57–1.00)
GFR calc Af Amer: 53 mL/min/{1.73_m2} — ABNORMAL LOW (ref >59)
GFR calc non Af Amer: 46 mL/min/{1.73_m2} — ABNORMAL LOW (ref >59)
Glucose: 209 mg/dL — ABNORMAL HIGH (ref 65–99)
Potassium: 4.8 mmol/L (ref 3.5–5.2)
Sodium: 139 mmol/L (ref 134–144)

## 2019-05-26 LAB — HEPATIC FUNCTION PANEL
ALT: 15 IU/L (ref 0–32)
AST: 13 IU/L (ref 0–40)
Albumin: 4.3 g/dL (ref 3.6–4.6)
Alkaline Phosphatase: 49 IU/L (ref 39–117)
Bilirubin Total: 0.2 mg/dL (ref 0.0–1.2)
Bilirubin, Direct: 0.11 mg/dL (ref 0.00–0.40)
Total Protein: 6.9 g/dL (ref 6.0–8.5)

## 2019-05-26 LAB — LIPID PANEL
Chol/HDL Ratio: 2.6 ratio (ref 0.0–4.4)
Cholesterol, Total: 122 mg/dL (ref 100–199)
HDL: 47 mg/dL (ref >39)
LDL Chol Calc (NIH): 53 mg/dL (ref 0–99)
Triglycerides: 121 mg/dL (ref 0–149)
VLDL Cholesterol Cal: 22 mg/dL (ref 5–40)

## 2019-05-29 ENCOUNTER — Other Ambulatory Visit: Payer: Self-pay

## 2019-05-29 ENCOUNTER — Encounter: Payer: Self-pay | Admitting: Cardiology

## 2019-05-29 ENCOUNTER — Ambulatory Visit (INDEPENDENT_AMBULATORY_CARE_PROVIDER_SITE_OTHER): Payer: Medicare Other | Admitting: Cardiology

## 2019-05-29 VITALS — BP 158/76 | HR 87 | Ht 62.5 in | Wt 170.2 lb

## 2019-05-29 DIAGNOSIS — E785 Hyperlipidemia, unspecified: Secondary | ICD-10-CM | POA: Diagnosis not present

## 2019-05-29 DIAGNOSIS — I209 Angina pectoris, unspecified: Secondary | ICD-10-CM | POA: Diagnosis not present

## 2019-05-29 DIAGNOSIS — Z794 Long term (current) use of insulin: Secondary | ICD-10-CM | POA: Diagnosis not present

## 2019-05-29 DIAGNOSIS — I2510 Atherosclerotic heart disease of native coronary artery without angina pectoris: Secondary | ICD-10-CM | POA: Diagnosis not present

## 2019-05-29 DIAGNOSIS — E119 Type 2 diabetes mellitus without complications: Secondary | ICD-10-CM | POA: Diagnosis not present

## 2019-05-29 DIAGNOSIS — I10 Essential (primary) hypertension: Secondary | ICD-10-CM

## 2019-05-29 MED ORDER — CLOPIDOGREL BISULFATE 75 MG PO TABS: 75.0000 mg | ORAL_TABLET | Freq: Every day | ORAL | 3 refills | Status: DC

## 2019-05-29 MED ORDER — METOPROLOL TARTRATE 50 MG PO TABS: 50.0000 mg | ORAL_TABLET | Freq: Two times a day (BID) | ORAL | 3 refills | Status: DC

## 2019-05-29 MED ORDER — ROSUVASTATIN CALCIUM 20 MG PO TABS: 20.0000 mg | ORAL_TABLET | Freq: Every day | ORAL | 3 refills | Status: DC

## 2019-05-29 MED ORDER — LOSARTAN POTASSIUM 25 MG PO TABS: 25.0000 mg | ORAL_TABLET | Freq: Every morning | ORAL | 3 refills | Status: DC

## 2019-05-29 MED ORDER — POTASSIUM CHLORIDE CRYS ER 20 MEQ PO TBCR: 20.0000 meq | EXTENDED_RELEASE_TABLET | Freq: Every day | ORAL | 3 refills | Status: DC

## 2019-05-30 DIAGNOSIS — N819 Female genital prolapse, unspecified: Secondary | ICD-10-CM | POA: Diagnosis not present

## 2019-05-30 DIAGNOSIS — R319 Hematuria, unspecified: Secondary | ICD-10-CM | POA: Diagnosis not present

## 2019-05-30 DIAGNOSIS — N390 Urinary tract infection, site not specified: Secondary | ICD-10-CM | POA: Diagnosis not present

## 2019-06-13 DIAGNOSIS — R319 Hematuria, unspecified: Secondary | ICD-10-CM | POA: Diagnosis not present

## 2019-06-13 DIAGNOSIS — N390 Urinary tract infection, site not specified: Secondary | ICD-10-CM | POA: Diagnosis not present

## 2019-06-28 ENCOUNTER — Encounter: Payer: Self-pay | Admitting: Podiatry

## 2019-06-28 ENCOUNTER — Other Ambulatory Visit: Payer: Self-pay

## 2019-06-28 ENCOUNTER — Ambulatory Visit (INDEPENDENT_AMBULATORY_CARE_PROVIDER_SITE_OTHER): Payer: Medicare Other | Admitting: Podiatry

## 2019-06-28 VITALS — BP 108/82 | HR 104

## 2019-06-28 DIAGNOSIS — M79674 Pain in right toe(s): Secondary | ICD-10-CM

## 2019-06-28 DIAGNOSIS — M79675 Pain in left toe(s): Secondary | ICD-10-CM | POA: Diagnosis not present

## 2019-06-28 DIAGNOSIS — E1142 Type 2 diabetes mellitus with diabetic polyneuropathy: Secondary | ICD-10-CM | POA: Diagnosis not present

## 2019-06-28 DIAGNOSIS — I209 Angina pectoris, unspecified: Secondary | ICD-10-CM | POA: Diagnosis not present

## 2019-06-28 DIAGNOSIS — B351 Tinea unguium: Secondary | ICD-10-CM | POA: Insufficient documentation

## 2019-06-28 NOTE — Progress Notes (Signed)
This patient presents to my office for an evaluation of her diabetic feet.  She says she has fullness sensation  around both her ankles.  She also says that she has had episodes of burning on the left forefoot which wakes her up while she is sleeping.  She says that it is not very common presently.  She says she is also diabetic and presents the office today for a diabetic foot exam and nail care. Patient has been diagnosed with diabetes and taking insulin.  Vascular  Dorsalis pedis and posterior tibial pulses are palpable  B/L.  Capillary return  WNL.  Temperature gradient is  WNL.  Skin turgor  WNL Leg swelling noted.  Sensorium  Senn Weinstein monofilament wire  WNL  Except left forefoot plantarly including her toes.  . Normal tactile sensation.  Nail Exam  Patient has thick disfigured discolored hallux nails.  Orthopedic  Exam  Muscle tone and muscle strength  WNL.  No limitations of motion feet  B/L.  No crepitus or joint effusion noted.  Foot type is unremarkable and digits show no abnormalities.  Bony prominences are unremarkable. No foot/ankle swelling.  Skin  No open lesions.  Normal skin texture and turgor.  Onychomycosis  X 2.  Diabetes with neuropathy.    IE.  Debride nails  X 2.  Diabetic foot exam reveals no vascular pathology.  Patient has neuropathy on left forefoot.  Discussed her ankle tightness and told her it may be tendon related.  She has no palpable pain, no swelling either ankle. Told her to return for nail care in 3 months.   Gardiner Barefoot DPM

## 2019-07-03 DIAGNOSIS — K641 Second degree hemorrhoids: Secondary | ICD-10-CM | POA: Diagnosis not present

## 2019-07-06 ENCOUNTER — Other Ambulatory Visit: Payer: Self-pay | Admitting: Cardiology

## 2019-07-07 NOTE — Telephone Encounter (Signed)
Medication was just recently filled 05/2019  rosuvastatin (CRESTOR) 20 MG tablet 90 tablet 3 05/29/2019    Sig - Route: Take 1 tablet (20 mg total) by mouth daily. - Oral   Sent to pharmacy as: rosuvastatin (CRESTOR) 20 MG tablet   E-Prescribing Status: Receipt confirmed by pharmacy (05/29/2019 10:39 AM EST)   Pharmacy  [place], [place]

## 2019-08-03 DIAGNOSIS — N644 Mastodynia: Secondary | ICD-10-CM | POA: Diagnosis not present

## 2019-08-03 DIAGNOSIS — N810 Urethrocele: Secondary | ICD-10-CM | POA: Diagnosis not present

## 2019-08-18 ENCOUNTER — Other Ambulatory Visit: Payer: Self-pay

## 2019-08-18 ENCOUNTER — Telehealth: Payer: Self-pay

## 2019-08-18 NOTE — Telephone Encounter (Signed)
Pt returned phone call that was made by this office to do the COVID-19 screening before an upcoming appt. Pt then requested to speak with the office manager about the COVID-19 vaccine registration process.  Pt was informing office manager that her husband has been trying to call and schedule a time to get the vaccine, but at this time the sign up is closed to do being at capacity. Pt asked if there was something we could do to help. Glass blower/designer requested that I place the pt and pt's husband on the waiting list so they can be notified via phone once they are able to get the vaccine. Pt and spouse do not have Internet to do this themselves. Both pt and pt's spouse were placed on the waiting list as requested and the pt's husband  was called and notified of this. Pt's husband verbalized understanding and thanked [place] for our help in this matter.

## 2019-08-22 ENCOUNTER — Other Ambulatory Visit: Payer: Self-pay

## 2019-08-22 ENCOUNTER — Encounter: Payer: Self-pay | Admitting: Endocrinology

## 2019-08-22 ENCOUNTER — Ambulatory Visit (INDEPENDENT_AMBULATORY_CARE_PROVIDER_SITE_OTHER): Payer: Medicare Other | Admitting: Endocrinology

## 2019-08-22 VITALS — BP 120/70 | HR 80 | Ht 62.5 in | Wt 172.4 lb

## 2019-08-22 DIAGNOSIS — E0829 Diabetes mellitus due to underlying condition with other diabetic kidney complication: Secondary | ICD-10-CM | POA: Diagnosis not present

## 2019-08-22 DIAGNOSIS — R809 Proteinuria, unspecified: Secondary | ICD-10-CM | POA: Diagnosis not present

## 2019-08-22 DIAGNOSIS — E119 Type 2 diabetes mellitus without complications: Secondary | ICD-10-CM | POA: Diagnosis not present

## 2019-08-22 DIAGNOSIS — Z794 Long term (current) use of insulin: Secondary | ICD-10-CM

## 2019-08-22 LAB — POCT GLYCOSYLATED HEMOGLOBIN (HGB A1C): Hemoglobin A1C: 7.8 % — AB (ref 4.0–5.6)

## 2019-08-22 LAB — T4, FREE: Free T4: 0.81 ng/dL (ref 0.60–1.60)

## 2019-08-22 LAB — TSH: TSH: 14.85 u[IU]/mL — ABNORMAL HIGH (ref 0.35–4.50)

## 2019-08-22 MED ORDER — SYNTHROID 175 MCG PO TABS: 175.0000 ug | ORAL_TABLET | Freq: Every day | ORAL | 1 refills | Status: DC

## 2019-08-22 NOTE — Patient Instructions (Addendum)
Blood tests are requested for you today.  We'll let you know about the results.  Please continue the same insulin On this type of insulin schedule, you should eat meals on a regular schedule.  If a meal is missed or significantly delayed, your blood sugar could go low.  check your blood sugar twice a day.  vary the time of day when you check, between before the 3 meals, and at bedtime.  also check if you have symptoms of your blood sugar being too high or too low.  please keep a record of the readings and bring it to your next appointment here (or you can bring the meter itself).  You can write it on any piece of paper.  please call us sooner if your blood sugar goes below 70, or if you have a lot of readings over 200.  Please come back for a follow-up appointment in 3 months.

## 2019-08-22 NOTE — Progress Notes (Signed)
Subjective:    Patient ID: Alicia Medina, female    DOB: 02/16/1933, 86 y.o.   MRN: [id number]  HPI Pt returns for f/u of diabetes mellitus:  DM type: Insulin-requiring type 2 Dx'ed: [id number] Complications: CAD, PN, and renal insuff.  Therapy: insulin since 2013.   GDM: never.  DKA: never.   Severe hypoglycemia: never.   Pancreatitis: never.  Pancreatic imaging: never Other: she declines multiple daily injections; She did not tolerate metformin (diarrhea); she changed lantus to levemir, then NPH, based on the pattern of cbg's.   Interval history: she brings a record of her cbg's which I have reviewed today.  cbg varies from 80-232.  There is no trend throughout the day.  She seldom misses the insulin, but she sometimes forgets until later in the day.  pt states she feels well in general.   She had thyroidect in 2008 (benign pathol).  She has been on synthroid since then.  She takes 150 mcg/day.  She requests brand name synthroid.   Past Medical History:  Diagnosis Date  . Arthritis   . CAD (coronary artery disease) CARDIOLOGIST-  DR Martinique   08-28-2011 Inferior STEMI with BMS to the RCA, complicated by cardiogenic shock, VF, VDRF , temporary CHB and abrupt reocclusion of the RCA stent treated with a second BMS to the RCA. Has residual LAD disease. EF is 50%  . Diverticulosis of colon   . Hemorrhoids, internal, with bleeding   . Hyperlipidemia   . Hypertension   . Hypothyroidism, postsurgical   . S/p bare metal coronary artery stent    2013  . Type 2 diabetes mellitus ([place])   . Wears hearing aid    both ears    Past Surgical History:  Procedure Laterality Date  . CARDIOVASCULAR STRESS TEST  10-03-2011  dr Martinique   normal perfusion / no ischemia/ ef 77%  . CARPAL TUNNEL RELEASE Right 01/25/2013   Procedure: CARPAL TUNNEL RELEASE;  Surgeon: Wynonia Sours, MD;  Location: [facility];  Service: Orthopedics;  Laterality: Right;  . CARPAL TUNNEL RELEASE Left 10/25/2013   Procedure: LEFT CARPAL TUNNEL RELEASE;  Surgeon: Wynonia Sours, MD;  Location: [facility];  Service: Orthopedics;  Laterality: Left;  . CATARACT EXTRACTION W/ INTRAOCULAR LENS  IMPLANT, BILATERAL  2007  . COLONOSCOPY  04-27-2003  . CORONARY ANGIOGRAM Left 08/28/2011   Procedure: CORONARY ANGIOGRAM;  Surgeon: Peter M Martinique, MD;  Location: [facility] CATH LAB;  Service: Cardiovascular;  Laterality: Left;  . CORONARY ANGIOPLASTY WITH STENT PLACEMENT  08-28-2011  dr Martinique   BMS to [facility] with abrupt reocclusion treated with second BMS/  LAD ostial 90%, proximal50-60%, first diagonal 80-90%  . CORONARY STENT INTERVENTION N/A 10/11/2018   Procedure: CORONARY STENT INTERVENTION;  Surgeon: Martinique, Peter M, MD;  Location: [facility] LAB;  Service: Cardiovascular;  Laterality: N/A;  . LEFT HEART CATH AND CORONARY ANGIOGRAPHY N/A 10/11/2018   Procedure: LEFT HEART CATH AND CORONARY ANGIOGRAPHY;  Surgeon: Martinique, Peter M, MD;  Location: [place] CV LAB;  Service: Cardiovascular;  Laterality: N/A;  . LEFT HEART CATHETERIZATION WITH CORONARY ANGIOGRAM N/A 08/28/2011   Procedure: LEFT HEART CATHETERIZATION WITH CORONARY ANGIOGRAM;  Surgeon: Peter M Martinique, MD;  Location: [facility] CATH LAB;  Service: Cardiovascular;  Laterality: N/A;  . PERCUTANEOUS CORONARY STENT INTERVENTION (PCI-S) Left 08/28/2011   Procedure: PERCUTANEOUS CORONARY STENT INTERVENTION (PCI-S);  Surgeon: Peter M Martinique, MD;  Location: [facility] CATH LAB;  Service: Cardiovascular;  Laterality:  Left;  . TOTAL THYROIDECTOMY Bilateral 05-02-2007   multinodular goiter  . TRANSANAL HEMORRHOIDAL DEARTERIALIZATION N/A 03/28/2014   Procedure: TRANSANAL HEMORRHOIDAL DEARTERIALIZATION;  Surgeon: Leighton Ruff, MD;  Location[facility];  Service: General;  Laterality: N/A;  . TRANSTHORACIC ECHOCARDIOGRAM  08-30-2011   mild LVH/ ef [id number]  grade I diastolic dysfunction  . TRIGGER FINGER RELEASE Right 01/25/2013   Procedure: RELEASE TRIGGER  FINGER/A-1 PULLEY RIGHT INDEX FINGER;  Surgeon: Wynonia Sours, MD;  Location: [place];  Service: Orthopedics;  Laterality: Right;  . TRIGGER FINGER RELEASE Left 10/25/2013   Procedure: RELEASE A-1 PULLEY LEFT MIDDLE FINGER;  Surgeon: Wynonia Sours, MD;  Location: [facility];  Service: Orthopedics;  Laterality: Left;  . TRIGGER FINGER RELEASE Left 12/04/2014   Procedure: RELEASE TRIGGER FINGER/A-1 PULLEY LEFT RING FINGER;  Surgeon: Daryll Brod, MD;  Location: [facility];  Service: Orthopedics;  Laterality: Left;  . TRIGGER FINGER RELEASE Right 03/30/2017   Procedure: RELEASE TRIGGER FINGER/A-1 PULLEY RIGHT RING FINGER AND RIGHT SMALL FINGER;  Surgeon: Daryll Brod, MD;  Location: [facility];  Service: Orthopedics;  Laterality: Right;    Social History   Socioeconomic History  . Marital status: Married    Spouse name: Not on file  . Number of children: Not on file  . Years of education: Not on file  . Highest education level: Not on file  Occupational History  . Not on file  Tobacco Use  . Smoking status: Never Smoker  . Smokeless tobacco: Never Used  Substance and Sexual Activity  . Alcohol use: Yes    Alcohol/week: 1.0 standard drinks    Types: 1 Glasses of wine per week    Comment: social  . Drug use: No  . Sexual activity: Not on file  Other Topics Concern  . Not on file  Social History Narrative   Lives with husband.   Has 2 sons- both healthy.   Younger son lives in [place], [place] in order son lives in [place].   Social Determinants of Health   Financial Resource Strain:   . Difficulty of Paying Living Expenses: Not on file  Food Insecurity:   . Worried About Charity fundraiser in the Last Year: Not on file  . Ran Out of Food in the Last Year: Not on file  Transportation Needs:   . Lack of Transportation (Medical): Not on file  . Lack of Transportation (Non-Medical): Not on file  Physical Activity:   . Days of  Exercise per Week: Not on file  . Minutes of Exercise per Session: Not on file  Stress:   . Feeling of Stress : Not on file  Social Connections:   . Frequency of Communication with Friends and Family: Not on file  . Frequency of Social Gatherings with Friends and Family: Not on file  . Attends Religious Services: Not on file  . Active Member of Clubs or Organizations: Not on file  . Attends Archivist Meetings: Not on file  . Marital Status: Not on file  Intimate Partner Violence:   . Fear of Current or Ex-Partner: Not on file  . Emotionally Abused: Not on file  . Physically Abused: Not on file  . Sexually Abused: Not on file    Current Outpatient Medications on File Prior to Visit  Medication Sig Dispense Refill  . acetaminophen (TYLENOL) 500 MG tablet Take 1,000 mg by mouth at bedtime.    Marland Kitchen  Ascorbic Acid (VITAMIN C) 1000 MG tablet Take 1,000 mg by mouth every evening.    Marland Kitchen aspirin EC 81 MG tablet Take 81 mg by mouth daily.    . Biotin w/ Vitamins C & E (HAIR/SKIN/NAILS PO) Take 1 tablet by mouth 2 (two) times daily.    Marland Kitchen bismuth subsalicylate (PEPTO BISMOL) 262 MG chewable tablet Chew 131-262 mg by mouth as directed.     . calcium carbonate (CALCIUM 600) 1500 (600 Ca) MG TABS tablet Take 600 mg of elemental calcium by mouth once a week.     . Cholecalciferol (VITAMIN D-3) 125 MCG (5000 UT) TABS Take 5,000 Units by mouth daily.    . Chromium Picolinate 1000 MCG TABS Take 1,000 mcg by mouth daily.    . clopidogrel (PLAVIX) 75 MG tablet Take 1 tablet (75 mg total) by mouth daily. 90 tablet 3  . Coenzyme Q10 200 MG TABS Take 200 mg by mouth daily.     . COLLAGEN PO Take 1,000 mg by mouth 2 (two) times daily.    Marland Kitchen ESTRACE VAGINAL 0.1 MG/GM vaginal cream     . folic acid (FOLVITE) Q000111Q MCG tablet Take 800 mcg by mouth daily.    Marland Kitchen glucose blood (TRUE METRIX BLOOD GLUCOSE TEST) test strip 1 each by Other route 2 (two) times daily. And lancets 2/day 200 each 3  . Insulin NPH,  Human,, Isophane, (HUMULIN N KWIKPEN) 100 UNIT/ML Kiwkpen Inject 42 Units into the skin every morning. 15 mL 11  . loperamide (IMODIUM A-D) 2 MG tablet Take 1-2 mg by mouth as directed.     . loratadine (CLARITIN) 10 MG tablet Take 10 mg by mouth at bedtime.    Marland Kitchen losartan (COZAAR) 25 MG tablet Take 1 tablet (25 mg total) by mouth every morning. 90 tablet 3  . Magnesium 250 MG TABS Take 250 mg by mouth daily.    . metoprolol tartrate (LOPRESSOR) 50 MG tablet Take 1 tablet (50 mg total) by mouth 2 (two) times daily. 180 tablet 3  . Multiple Vitamin (MULTIVITAMIN) tablet Take 1 tablet by mouth once a week.     . nitrofurantoin, macrocrystal-monohydrate, (MACROBID) 100 MG capsule Take 100 mg by mouth 2 (two) times daily.    . potassium chloride SA (KLOR-CON) 20 MEQ tablet Take 1 tablet (20 mEq total) by mouth daily. 90 tablet 3  . Psyllium (METAMUCIL PO) Take 1 capsule by mouth daily.    . rosuvastatin (CRESTOR) 20 MG tablet Take 1 tablet (20 mg total) by mouth daily. 90 tablet 3  . sulfamethoxazole-trimethoprim (BACTRIM DS) 800-160 MG tablet Take 1 tablet by mouth 2 (two) times daily.    . vitamin B-12 (CYANOCOBALAMIN) 500 MCG tablet Take 500 mcg by mouth daily.    Marland Kitchen VITAMIN E PO Take 500 mg by mouth 2 (two) times a week.      No current facility-administered medications on file prior to visit.    Allergies  Allergen Reactions  . Demeclocycline Other (See Comments)    Severe stomach cramps Severe stomach cramps Severe stomach cramps  . Lisinopril Cough  . Tetracyclines & Related Other (See Comments)    Severe stomach cramps    Family History  Problem Relation Age of Onset  . Stroke Mother   . Cancer Mother        breast  . Cancer Father        lung  . Stroke Father   . Diabetes Neg Hx     BP  120/70 (BP Location: Left Arm, Patient Position: Sitting, Cuff Size: Large)   Pulse 80   Ht 5' 2.5" (1.588 m)   Wt 172 lb 6.4 oz (78.2 kg)   SpO2 99%   BMI 31.03 kg/m    Review of  Systems She denies hypoglycemia.      Objective:   Physical Exam VITAL SIGNS:  See vs page GENERAL: no distress Pulses: dorsalis pedis intact bilat.   MSK: no deformity of the feet CV: 1+ bilat leg edema, and bilat vv's Skin:  no ulcer on the feet.  normal color and temp on the feet. Neuro: sensation is intact to touch on the feet.    Ext: there is bilateral onychomycosis of the toenails   Lab Results  Component Value Date   HGBA1C 7.8 (A) 08/22/2019   Lab Results  Component Value Date   TSH 14.85 (H) 08/22/2019      Assessment & Plan:  Insulin-requiring type 2 DM, with CAD: this is the best control this pt should aim for, given this regimen, which does match insulin to her changing needs throughout the day Hypothyroidism: she needs increased rx.  I have sent a prescription to your pharmacy Renal insuff: in this setting, she needs an intermed-acting qd insulin  Patient Instructions  Blood tests are requested for you today.  We'll let you know about the results.  Please continue the same insulin On this type of insulin schedule, you should eat meals on a regular schedule.  If a meal is missed or significantly delayed, your blood sugar could go low.  check your blood sugar twice a day.  vary the time of day when you check, between before the 3 meals, and at bedtime.  also check if you have symptoms of your blood sugar being too high or too low.  please keep a record of the readings and bring it to your next appointment here (or you can bring the meter itself).  You can write it on any piece of paper.  please call us sooner if your blood sugar goes below 70, or if you have a lot of readings over 200.  Please come back for a follow-up appointment in 3 months.

## 2019-08-23 ENCOUNTER — Telehealth: Payer: Self-pay

## 2019-08-23 NOTE — Telephone Encounter (Signed)
Outpatient Medication Detail   Disp Refills Start End   SYNTHROID 175 MCG tablet 90 tablet 1 08/22/2019    Sig - Route: Take 1 tablet (175 mcg total) by mouth daily before breakfast. - Oral   Sent to pharmacy as: SYNTHROID 175 MCG tablet   E-Prescribing Status: Receipt confirmed by pharmacy (08/22/2019  4:49 PM EST)    Pt returned call. Informed her about above new orders.Verbalized acceptance and understanding.

## 2019-08-23 NOTE — Telephone Encounter (Signed)
SECOND ATTEMPT: ° °LVM requesting returned call. °

## 2019-08-23 NOTE — Telephone Encounter (Signed)
Lab results reviewed by Dr. Ellison. Called pt to inform about lab results as well as new orders. LVM requesting returned call. 

## 2019-08-23 NOTE — Telephone Encounter (Signed)
-----   Message from Renato Shin, MD sent at 08/22/2019  4:50 PM EST ----- please contact patient: We need to increase the thyroid pill.  I have sent a prescription to your pharmacy.  I'll see you next time.

## 2019-08-24 DIAGNOSIS — N810 Urethrocele: Secondary | ICD-10-CM | POA: Diagnosis not present

## 2019-08-30 DIAGNOSIS — E109 Type 1 diabetes mellitus without complications: Secondary | ICD-10-CM | POA: Diagnosis not present

## 2019-08-30 DIAGNOSIS — M25561 Pain in right knee: Secondary | ICD-10-CM | POA: Diagnosis not present

## 2019-08-30 DIAGNOSIS — M1711 Unilateral primary osteoarthritis, right knee: Secondary | ICD-10-CM | POA: Diagnosis not present

## 2019-09-01 NOTE — Telephone Encounter (Signed)
Patient calling today to see when she will be getting her Covid vaccine- she states she was told her and her husband were put on a waiting list and is trying to find out where they stand at this point-I did advise the patient of the note stating she would get a call but she still wanted to know if we have heard anything-please advise

## 2019-09-01 NOTE — Telephone Encounter (Signed)
Called pt and advised her that unfortunately, we do not know anything else, and going forward, we will not be notified of anything. Pt was informed that all this office did was assist the pt and her spouse in signing up for the waiting list. Outside of that, there is nothing else we can do at this point except wait for the pt to receive the phone call. Pt did verbalize understanding of this.

## 2019-09-26 ENCOUNTER — Ambulatory Visit (INDEPENDENT_AMBULATORY_CARE_PROVIDER_SITE_OTHER): Payer: Medicare Other | Admitting: Podiatry

## 2019-09-26 ENCOUNTER — Other Ambulatory Visit: Payer: Self-pay

## 2019-09-26 DIAGNOSIS — E1142 Type 2 diabetes mellitus with diabetic polyneuropathy: Secondary | ICD-10-CM

## 2019-09-26 DIAGNOSIS — M79674 Pain in right toe(s): Secondary | ICD-10-CM | POA: Diagnosis not present

## 2019-09-26 DIAGNOSIS — M79675 Pain in left toe(s): Secondary | ICD-10-CM

## 2019-09-26 DIAGNOSIS — M25571 Pain in right ankle and joints of right foot: Secondary | ICD-10-CM

## 2019-09-26 DIAGNOSIS — G8929 Other chronic pain: Secondary | ICD-10-CM | POA: Diagnosis not present

## 2019-09-26 DIAGNOSIS — B351 Tinea unguium: Secondary | ICD-10-CM

## 2019-09-26 NOTE — Progress Notes (Signed)
This patient returns to the office for treatment of her long ingrowing  toenails on both big toes.  She says his nails have become thick and she is unable to self treat.  These nails are painful walking wearing her shoes.  She also believes that she has numbness over the right ankle.  She points to an area extending from above her right ankle to under the right ankle.  Patient states there is no evidence of any redness or swelling noted in the right ankle.  Patient is a diabetic and believes she may be experiencing neuropathy pain in her right ankle.  She presents the office today for an evaluation of her nails and her possible neuropathy.  General Appearance  Alert, conversant and in no acute stress.  Vascular  Dorsalis pedis and posterior tibial  pulses are palpable  bilaterally.  Capillary return is within normal limits  bilaterally. Temperature is within normal limits  bilaterally.  Neurologic  Senn-Weinstein monofilament wire test within normal limits  bilaterally. Muscle power within normal limits bilaterally.  Nails Thick disfigured discolored nails with subungual debris hallux nails  B/L.Marland Kitchen No evidence of bacterial infection or drainage bilaterally.  Orthopedic  No limitations of motion  feet .  No crepitus or effusions noted.  No bony pathology or digital deformities noted. No redness or swelling or palpable pain right ankle.  Skin  normotropic skin with no porokeratosis noted bilaterally.  No signs of infections or ulcers noted.    Onychomycosis  X 2.  Nail dystrophy  X 2.    Debride nails  X 2.  Told this patient that her ankle neuropathy is probably more arthritis or tendon pathology .  She discussed the same problem in December 2020  And called her ankle discomfort tightness.  Told her she could follow-up with the podiatric doctors for this condition if she wants.  In the meantime I recommended she try Spenco three-quarter orthoses to be worn in her shoes.  RTC 3 months.     Gardiner Barefoot DPM

## 2019-11-20 ENCOUNTER — Other Ambulatory Visit: Payer: Self-pay

## 2019-11-20 ENCOUNTER — Ambulatory Visit (INDEPENDENT_AMBULATORY_CARE_PROVIDER_SITE_OTHER): Payer: Medicare Other | Admitting: Endocrinology

## 2019-11-20 ENCOUNTER — Encounter: Payer: Self-pay | Admitting: Endocrinology

## 2019-11-20 VITALS — BP 138/70 | HR 87 | Ht 62.5 in | Wt 175.6 lb

## 2019-11-20 DIAGNOSIS — E1159 Type 2 diabetes mellitus with other circulatory complications: Secondary | ICD-10-CM | POA: Diagnosis not present

## 2019-11-20 DIAGNOSIS — E0829 Diabetes mellitus due to underlying condition with other diabetic kidney complication: Secondary | ICD-10-CM

## 2019-11-20 DIAGNOSIS — Z794 Long term (current) use of insulin: Secondary | ICD-10-CM

## 2019-11-20 DIAGNOSIS — R809 Proteinuria, unspecified: Secondary | ICD-10-CM

## 2019-11-20 DIAGNOSIS — E119 Type 2 diabetes mellitus without complications: Secondary | ICD-10-CM

## 2019-11-20 LAB — POCT GLYCOSYLATED HEMOGLOBIN (HGB A1C): Hemoglobin A1C: 8.3 % — AB (ref 4.0–5.6)

## 2019-11-20 LAB — TSH: TSH: 0.74 u[IU]/mL (ref 0.35–4.50)

## 2019-11-20 LAB — T4, FREE: Free T4: 1.11 ng/dL (ref 0.60–1.60)

## 2019-11-20 NOTE — Patient Instructions (Addendum)
Blood tests are requested for you today.  We'll let you know about the results.  Please continue the same insulin.  On this type of insulin schedule, you should eat meals on a regular schedule.  If a meal is missed or significantly delayed, your blood sugar could go low.  check your blood sugar twice a day.  vary the time of day when you check, between before the 3 meals, and at bedtime.  also check if you have symptoms of your blood sugar being too high or too low.  please keep a record of the readings and bring it to your next appointment here (or you can bring the meter itself).  You can write it on any piece of paper.  please call us sooner if your blood sugar goes below 70, or if you have a lot of readings over 200.  Please come back for a follow-up appointment in 3 months.

## 2019-11-20 NOTE — Progress Notes (Signed)
Subjective:    Patient ID: Alicia Medina, female    DOB: 1933-07-08, 86 y.o.   MRN: [id number]  HPI Pt returns for f/u of diabetes mellitus:  DM type: Insulin-requiring type 2 Dx'ed: [id number] Complications: CAD, PN, and CRI.  Therapy: insulin since 2013.   GDM: never.  DKA: never.   Severe hypoglycemia: never.   Pancreatitis: never.  Pancreatic imaging: never Other: she declines multiple daily injections; She did not tolerate metformin (diarrhea); she changed lantus to levemir, then NPH, based on the pattern of cbg's.   Interval history: she brings a record of her cbg's which I have reviewed today.  cbg varies from 82-292.  All are checked fasting.  Pt says she misses the insulin approx twice per month.  pt states she feels well in general.   She had thyroidect in 2008 (benign pathol; she has been on synthroid since then;  she requests brand name synthroid).   Past Medical History:  Diagnosis Date  . Arthritis   . CAD (coronary artery disease) CARDIOLOGIST-  DR Martinique   08-28-2011 Inferior STEMI with BMS to the RCA, complicated by cardiogenic shock, VF, VDRF , temporary CHB and abrupt reocclusion of the RCA stent treated with a second BMS to the RCA. Has residual LAD disease. EF is 50%  . Diverticulosis of colon   . Hemorrhoids, internal, with bleeding   . Hyperlipidemia   . Hypertension   . Hypothyroidism, postsurgical   . S/p bare metal coronary artery stent    2013  . Type 2 diabetes mellitus ([place])   . Wears hearing aid    both ears    Past Surgical History:  Procedure Laterality Date  . CARDIOVASCULAR STRESS TEST  10-03-2011  dr Martinique   normal perfusion / no ischemia/ ef 77%  . CARPAL TUNNEL RELEASE Right 01/25/2013   Procedure: CARPAL TUNNEL RELEASE;  Surgeon: Wynonia Sours, MD;  Location: [place];  Service: Orthopedics;  Laterality: Right;  . CARPAL TUNNEL RELEASE Left 10/25/2013   Procedure: LEFT CARPAL TUNNEL RELEASE;  Surgeon: Wynonia Sours, MD;   Location: [place];  Service: Orthopedics;  Laterality: Left;  . CATARACT EXTRACTION W/ INTRAOCULAR LENS  IMPLANT, BILATERAL  2007  . COLONOSCOPY  04-27-2003  . CORONARY ANGIOGRAM Left 08/28/2011   Procedure: CORONARY ANGIOGRAM;  Surgeon: Peter M Martinique, MD;  Location: [facility] CATH LAB;  Service: Cardiovascular;  Laterality: Left;  . CORONARY ANGIOPLASTY WITH STENT PLACEMENT  08-28-2011  dr Martinique   BMS to [facility] with abrupt reocclusion treated with second BMS/  LAD ostial 90%, proximal50-60%, first diagonal 80-90%  . CORONARY STENT INTERVENTION N/A 10/11/2018   Procedure: CORONARY STENT INTERVENTION;  Surgeon: Martinique, Peter M, MD;  Location: [place] CV LAB;  Service: Cardiovascular;  Laterality: N/A;  . LEFT HEART CATH AND CORONARY ANGIOGRAPHY N/A 10/11/2018   Procedure: LEFT HEART CATH AND CORONARY ANGIOGRAPHY;  Surgeon: Martinique, Peter M, MD;  Location: [place] CV LAB;  Service: Cardiovascular;  Laterality: N/A;  . LEFT HEART CATHETERIZATION WITH CORONARY ANGIOGRAM N/A 08/28/2011   Procedure: LEFT HEART CATHETERIZATION WITH CORONARY ANGIOGRAM;  Surgeon: Peter M Martinique, MD;  Location: [facility] CATH LAB;  Service: Cardiovascular;  Laterality: N/A;  . PERCUTANEOUS CORONARY STENT INTERVENTION (PCI-S) Left 08/28/2011   Procedure: PERCUTANEOUS CORONARY STENT INTERVENTION (PCI-S);  Surgeon: Peter M Martinique, MD;  Location: [facility] CATH LAB;  Service: Cardiovascular;  Laterality: Left;  . TOTAL THYROIDECTOMY Bilateral 05-02-2007   multinodular goiter  .  TRANSANAL HEMORRHOIDAL DEARTERIALIZATION N/A 03/28/2014   Procedure: TRANSANAL HEMORRHOIDAL DEARTERIALIZATION;  Surgeon: Leighton Ruff, MD;  Location: [facility];  Service: General;  Laterality: N/A;  . TRANSTHORACIC ECHOCARDIOGRAM  08-30-2011   mild LVH/ ef [id number]  grade I diastolic dysfunction  . TRIGGER FINGER RELEASE Right 01/25/2013   Procedure: RELEASE TRIGGER FINGER/A-1 PULLEY RIGHT INDEX FINGER;  Surgeon: Wynonia Sours, MD;  Location:  [place];  Service: Orthopedics;  Laterality: Right;  . TRIGGER FINGER RELEASE Left 10/25/2013   Procedure: RELEASE A-1 PULLEY LEFT MIDDLE FINGER;  Surgeon: Wynonia Sours, MD;  Location: [place];  Service: Orthopedics;  Laterality: Left;  . TRIGGER FINGER RELEASE Left 12/04/2014   Procedure: RELEASE TRIGGER FINGER/A-1 PULLEY LEFT RING FINGER;  Surgeon: Daryll Brod, MD;  Location: [place];  Service: Orthopedics;  Laterality: Left;  . TRIGGER FINGER RELEASE Right 03/30/2017   Procedure: RELEASE TRIGGER FINGER/A-1 PULLEY RIGHT RING FINGER AND RIGHT SMALL FINGER;  Surgeon: Daryll Brod, MD;  Location: [place];  Service: Orthopedics;  Laterality: Right;    Social History   Socioeconomic History  . Marital status: Married    Spouse name: Not on file  . Number of children: Not on file  . Years of education: Not on file  . Highest education level: Not on file  Occupational History  . Not on file  Tobacco Use  . Smoking status: Never Smoker  . Smokeless tobacco: Never Used  Substance and Sexual Activity  . Alcohol use: Yes    Alcohol/week: 1.0 standard drinks    Types: 1 Glasses of wine per week    Comment: social  . Drug use: No  . Sexual activity: Not on file  Other Topics Concern  . Not on file  Social History Narrative   Lives with husband.   Has 2 sons- both healthy.   Younger son lives in [place], [place] in order son lives in [place].   Social Determinants of Health   Financial Resource Strain:   . Difficulty of Paying Living Expenses:   Food Insecurity:   . Worried About Charity fundraiser in the Last Year:   . Arboriculturist in the Last Year:   Transportation Needs:   . Film/video editor (Medical):   Marland Kitchen Lack of Transportation (Non-Medical):   Physical Activity:   . Days of Exercise per Week:   . Minutes of Exercise per Session:   Stress:   . Feeling of Stress :   Social Connections:   . Frequency of  Communication with Friends and Family:   . Frequency of Social Gatherings with Friends and Family:   . Attends Religious Services:   . Active Member of Clubs or Organizations:   . Attends Archivist Meetings:   Marland Kitchen Marital Status:   Intimate Partner Violence:   . Fear of Current or Ex-Partner:   . Emotionally Abused:   Marland Kitchen Physically Abused:   . Sexually Abused:     Current Outpatient Medications on File Prior to Visit  Medication Sig Dispense Refill  . acetaminophen (TYLENOL) 500 MG tablet Take 1,000 mg by mouth at bedtime.    . Ascorbic Acid (VITAMIN C) 1000 MG tablet Take 1,000 mg by mouth every evening.    . Aspirin Buf,CaCarb-MgCarb-MgO, 81 MG TABS aspirin 81 mg tablet  Take by oral route.    Marland Kitchen aspirin EC 81 MG tablet Take 81 mg by mouth daily.    Marland Kitchen  Biotin w/ Vitamins C & E (HAIR/SKIN/NAILS PO) Take 1 tablet by mouth 2 (two) times daily.    Marland Kitchen bismuth subsalicylate (PEPTO BISMOL) 262 MG chewable tablet Chew 131-262 mg by mouth as directed.     . calcium carbonate (CALCIUM 600) 1500 (600 Ca) MG TABS tablet Take 600 mg of elemental calcium by mouth once a week.     . Cholecalciferol (VITAMIN D-3) 125 MCG (5000 UT) TABS Take 5,000 Units by mouth daily.    . Chromium Picolinate 1000 MCG TABS Take 1,000 mcg by mouth daily.    . clopidogrel (PLAVIX) 75 MG tablet Take 1 tablet (75 mg total) by mouth daily. 90 tablet 3  . Coenzyme Q10 200 MG TABS Take 200 mg by mouth daily.     . COLLAGEN PO Take 1,000 mg by mouth 2 (two) times daily.    Marland Kitchen ESTRACE VAGINAL 0.1 MG/GM vaginal cream     . folic acid (FOLVITE) Q000111Q MCG tablet Take 800 mcg by mouth daily.    Marland Kitchen glucose blood (TRUE METRIX BLOOD GLUCOSE TEST) test strip 1 each by Other route 2 (two) times daily. And lancets 2/day 200 each 3  . Insulin NPH, Human,, Isophane, (HUMULIN N KWIKPEN) 100 UNIT/ML Kiwkpen Inject 42 Units into the skin every morning. 15 mL 11  . loperamide (IMODIUM A-D) 2 MG tablet Take 1-2 mg by mouth as directed.      . loratadine (CLARITIN) 10 MG tablet Take 10 mg by mouth at bedtime.    Marland Kitchen losartan (COZAAR) 25 MG tablet Take 1 tablet (25 mg total) by mouth every morning. 90 tablet 3  . Magnesium 250 MG TABS Take 250 mg by mouth daily.    . metoprolol tartrate (LOPRESSOR) 50 MG tablet Take 1 tablet (50 mg total) by mouth 2 (two) times daily. 180 tablet 3  . Multiple Vitamin (MULTIVITAMIN) tablet Take 1 tablet by mouth once a week.     . nitrofurantoin, macrocrystal-monohydrate, (MACROBID) 100 MG capsule Take 100 mg by mouth 2 (two) times daily.    . potassium chloride SA (KLOR-CON) 20 MEQ tablet Take 1 tablet (20 mEq total) by mouth daily. 90 tablet 3  . Psyllium (METAMUCIL PO) Take 1 capsule by mouth daily.    . rosuvastatin (CRESTOR) 20 MG tablet Take 1 tablet (20 mg total) by mouth daily. 90 tablet 3  . sulfamethoxazole-trimethoprim (BACTRIM DS) 800-160 MG tablet Take 1 tablet by mouth 2 (two) times daily.    Marland Kitchen SYNTHROID 175 MCG tablet Take 1 tablet (175 mcg total) by mouth daily before breakfast. 90 tablet 1  . vitamin B-12 (CYANOCOBALAMIN) 500 MCG tablet Take 500 mcg by mouth daily.    Marland Kitchen VITAMIN E PO Take 500 mg by mouth 2 (two) times a week.      No current facility-administered medications on file prior to visit.    Allergies  Allergen Reactions  . Demeclocycline Other (See Comments)    Severe stomach cramps Severe stomach cramps Severe stomach cramps  . Lisinopril Cough  . Tetracyclines & Related Other (See Comments)    Severe stomach cramps    Family History  Problem Relation Age of Onset  . Stroke Mother   . Cancer Mother        breast  . Cancer Father        lung  . Stroke Father   . Diabetes Neg Hx     BP 138/70 (BP Location: Left Arm, Patient Position: Sitting, Cuff Size: Normal)  Pulse 87   Ht 5' 2.5" (1.588 m)   Wt 175 lb 9.6 oz (79.7 kg)   SpO2 98%   BMI 31.61 kg/m    Review of Systems She denies hypoglycemia.     Objective:   Physical Exam VITAL SIGNS:  See  vs page GENERAL: no distress Pulses: dorsalis pedis intact bilat.   MSK: no deformity of the feet CV: no leg edema Skin:  no ulcer on the feet.  normal color and temp on the feet. Neuro: sensation is intact to touch on the feet  Lab Results  Component Value Date   HGBA1C 8.3 (A) 11/20/2019   Lab Results  Component Value Date   CREATININE 1.11 (H) 05/25/2019   BUN 12 05/25/2019   NA 139 05/25/2019   K 4.8 05/25/2019   CL 103 05/25/2019   CO2 22 05/25/2019   Lab Results  Component Value Date   TSH 0.74 11/20/2019      Assessment & Plan:  Insulin-requiring type 2 DM, with CAD: worse.  She declines to add another med Hypothyroidism: well-replaced.  Please continue the same medication  Patient Instructions  Blood tests are requested for you today.  We'll let you know about the results.  Please continue the same insulin.  On this type of insulin schedule, you should eat meals on a regular schedule.  If a meal is missed or significantly delayed, your blood sugar could go low.  check your blood sugar twice a day.  vary the time of day when you check, between before the 3 meals, and at bedtime.  also check if you have symptoms of your blood sugar being too high or too low.  please keep a record of the readings and bring it to your next appointment here (or you can bring the meter itself).  You can write it on any piece of paper.  please call us sooner if your blood sugar goes below 70, or if you have a lot of readings over 200.  Please come back for a follow-up appointment in 3 months.

## 2019-11-21 DIAGNOSIS — N810 Urethrocele: Secondary | ICD-10-CM | POA: Diagnosis not present

## 2019-11-22 ENCOUNTER — Telehealth: Payer: Self-pay

## 2019-11-22 NOTE — Telephone Encounter (Signed)
LAB RESULTS  Lab results were reviewed by Dr. Ellison. A letter has been mailed to pt home address. For future reference, letter can be found in Epic. 

## 2019-11-22 NOTE — Telephone Encounter (Signed)
-----   Message from Renato Shin, MD sent at 11/20/2019  6:59 PM EDT ----- please contact patient: Normal.  Please continue the same medication. I'll see you next time.

## 2019-12-12 NOTE — Progress Notes (Signed)
Cardiology Office Note    Date:  12/14/2019   ID:  Alicia Medina, DOB 08/12/1932, MRN [id number]  PCP:  Patient, No Pcp Per  Cardiologist:  Dr. Martinique   Chief Complaint  Patient presents with  . Coronary Artery Disease    History of Present Illness:  Alicia Medina is a 86 y.o. female with PMH of CAD, HTN, HLD, hypothyroidism, and DM II. patient had a inferior MI treated with BMS to RCA in 08/2011.  She required defibrillation multiple times and her hospitalization was complicated by cardiogenic shock, V. fib, VDRF, and complete heart block requiring temporary pacemaker.  Unfortunately, she had abrupt occlusion of the RCA stent and was treated with a second BMS to RCA.  Follow-up Myoview in March 2013 showed normal perfusion with EF of 76%.  Patient was seen in Feb 2020 with  intermittent chest pain radiating to her back between her shoulder blades.  Myoview showed moderate perfusion defect in the inferoapex, lateral apex, and and her apex that is new when compared to 2013.  The study was interpreted as intermediate risk with EF of 63%.  Patient ultimately underwent elective cardiac catheterization on 10/11/2018 which revealed a 100% proximal to mid LAD occlusion which was treated with 2.75 x 28 mm Synergy DES, widely patent mid RCA stent.   On follow up today she is doing well. She denies any significant chest pain or dyspnea. Has occasional aches and pains. Her knee is bothering her. No edema or palpitations.   Past Medical History:  Diagnosis Date  . Arthritis   . CAD (coronary artery disease) CARDIOLOGIST-  DR Martinique   08-28-2011 Inferior STEMI with BMS to the RCA, complicated by cardiogenic shock, VF, VDRF , temporary CHB and abrupt reocclusion of the RCA stent treated with a second BMS to the RCA. Has residual LAD disease. EF is 50%  . Diverticulosis of colon   . Hemorrhoids, internal, with bleeding   . Hyperlipidemia   . Hypertension   . Hypothyroidism, postsurgical   . S/p bare  metal coronary artery stent    2013  . Type 2 diabetes mellitus ([place])   . Wears hearing aid    both ears    Past Surgical History:  Procedure Laterality Date  . CARDIOVASCULAR STRESS TEST  10-03-2011  dr Martinique   normal perfusion / no ischemia/ ef 77%  . CARPAL TUNNEL RELEASE Right 01/25/2013   Procedure: CARPAL TUNNEL RELEASE;  Surgeon: Wynonia Sours, MD;  Location: [place];  Service: Orthopedics;  Laterality: Right;  . CARPAL TUNNEL RELEASE Left 10/25/2013   Procedure: LEFT CARPAL TUNNEL RELEASE;  Surgeon: Wynonia Sours, MD;  Location: [place];  Service: Orthopedics;  Laterality: Left;  . CATARACT EXTRACTION W/ INTRAOCULAR LENS  IMPLANT, BILATERAL  2007  . COLONOSCOPY  04-27-2003  . CORONARY ANGIOGRAM Left 08/28/2011   Procedure: CORONARY ANGIOGRAM;  Surgeon: Blakelee Allington M Martinique, MD;  Location: [facility] CATH LAB;  Service: Cardiovascular;  Laterality: Left;  . CORONARY ANGIOPLASTY WITH STENT PLACEMENT  08-28-2011  dr Martinique   BMS to [facility] with abrupt reocclusion treated with second BMS/  LAD ostial 90%, proximal50-60%, first diagonal 80-90%  . CORONARY STENT INTERVENTION N/A 10/11/2018   Procedure: CORONARY STENT INTERVENTION;  Surgeon: Martinique, Azar South M, MD;  Location: [place] CV LAB;  Service: Cardiovascular;  Laterality: N/A;  . LEFT HEART CATH AND CORONARY ANGIOGRAPHY N/A 10/11/2018   Procedure: LEFT HEART CATH AND CORONARY ANGIOGRAPHY;  Surgeon:  Martinique, Imraan Wendell M, MD;  Location: [place] CV LAB;  Service: Cardiovascular;  Laterality: N/A;  . LEFT HEART CATHETERIZATION WITH CORONARY ANGIOGRAM N/A 08/28/2011   Procedure: LEFT HEART CATHETERIZATION WITH CORONARY ANGIOGRAM;  Surgeon: Cayenne Breault M Martinique, MD;  Location: [facility] CATH LAB;  Service: Cardiovascular;  Laterality: N/A;  . PERCUTANEOUS CORONARY STENT INTERVENTION (PCI-S) Left 08/28/2011   Procedure: PERCUTANEOUS CORONARY STENT INTERVENTION (PCI-S);  Surgeon: Nicolaus Andel M Martinique, MD;  Location: [facility] CATH LAB;  Service:  Cardiovascular;  Laterality: Left;  . TOTAL THYROIDECTOMY Bilateral 05-02-2007   multinodular goiter  . TRANSANAL HEMORRHOIDAL DEARTERIALIZATION N/A 03/28/2014   Procedure: TRANSANAL HEMORRHOIDAL DEARTERIALIZATION;  Surgeon: Leighton Ruff, MD;  Location: [facility];  Service: General;  Laterality: N/A;  . TRANSTHORACIC ECHOCARDIOGRAM  08-30-2011   mild LVH/ ef [id number]  grade I diastolic dysfunction  . TRIGGER FINGER RELEASE Right 01/25/2013   Procedure: RELEASE TRIGGER FINGER/A-1 PULLEY RIGHT INDEX FINGER;  Surgeon: Wynonia Sours, MD;  Location: [place];  Service: Orthopedics;  Laterality: Right;  . TRIGGER FINGER RELEASE Left 10/25/2013   Procedure: RELEASE A-1 PULLEY LEFT MIDDLE FINGER;  Surgeon: Wynonia Sours, MD;  Location: [place];  Service: Orthopedics;  Laterality: Left;  . TRIGGER FINGER RELEASE Left 12/04/2014   Procedure: RELEASE TRIGGER FINGER/A-1 PULLEY LEFT RING FINGER;  Surgeon: Daryll Brod, MD;  Location: [place];  Service: Orthopedics;  Laterality: Left;  . TRIGGER FINGER RELEASE Right 03/30/2017   Procedure: RELEASE TRIGGER FINGER/A-1 PULLEY RIGHT RING FINGER AND RIGHT SMALL FINGER;  Surgeon: Daryll Brod, MD;  Location: [place];  Service: Orthopedics;  Laterality: Right;    Current Medications: Outpatient Medications Prior to Visit  Medication Sig Dispense Refill  . acetaminophen (TYLENOL) 500 MG tablet Take 1,000 mg by mouth at bedtime.    . Ascorbic Acid (VITAMIN C) 1000 MG tablet Take 1,000 mg by mouth every evening.    . Aspirin Buf,CaCarb-MgCarb-MgO, 81 MG TABS aspirin 81 mg tablet  Take by oral route.    Marland Kitchen aspirin EC 81 MG tablet Take 81 mg by mouth daily.    . Biotin w/ Vitamins C & E (HAIR/SKIN/NAILS PO) Take 1 tablet by mouth 2 (two) times daily.    Marland Kitchen bismuth subsalicylate (PEPTO BISMOL) 262 MG chewable tablet Chew 131-262 mg by mouth as directed.     . calcium carbonate (CALCIUM 600) 1500  (600 Ca) MG TABS tablet Take 600 mg of elemental calcium by mouth once a week.     . Cholecalciferol (VITAMIN D-3) 125 MCG (5000 UT) TABS Take 5,000 Units by mouth daily.    . Chromium Picolinate 1000 MCG TABS Take 1,000 mcg by mouth daily.    . Coenzyme Q10 200 MG TABS Take 200 mg by mouth daily.     . COLLAGEN PO Take 1,000 mg by mouth 2 (two) times daily.    Marland Kitchen ESTRACE VAGINAL 0.1 MG/GM vaginal cream     . folic acid (FOLVITE) Q000111Q MCG tablet Take 800 mcg by mouth daily.    Marland Kitchen glucose blood (TRUE METRIX BLOOD GLUCOSE TEST) test strip 1 each by Other route 2 (two) times daily. And lancets 2/day 200 each 3  . Insulin NPH, Human,, Isophane, (HUMULIN N KWIKPEN) 100 UNIT/ML Kiwkpen Inject 42 Units into the skin every morning. 15 mL 11  . loperamide (IMODIUM A-D) 2 MG tablet Take 1-2 mg by mouth as directed.     . loratadine (CLARITIN) 10 MG tablet  Take 10 mg by mouth at bedtime.    Marland Kitchen losartan (COZAAR) 25 MG tablet Take 1 tablet (25 mg total) by mouth every morning. 90 tablet 3  . Magnesium 250 MG TABS Take 250 mg by mouth daily.    . metoprolol tartrate (LOPRESSOR) 50 MG tablet Take 1 tablet (50 mg total) by mouth 2 (two) times daily. 180 tablet 3  . Multiple Vitamin (MULTIVITAMIN) tablet Take 1 tablet by mouth once a week.     . nitrofurantoin, macrocrystal-monohydrate, (MACROBID) 100 MG capsule Take 100 mg by mouth 2 (two) times daily.    . potassium chloride SA (KLOR-CON) 20 MEQ tablet Take 1 tablet (20 mEq total) by mouth daily. 90 tablet 3  . Psyllium (METAMUCIL PO) Take 1 capsule by mouth daily.    . rosuvastatin (CRESTOR) 20 MG tablet Take 1 tablet (20 mg total) by mouth daily. 90 tablet 3  . sulfamethoxazole-trimethoprim (BACTRIM DS) 800-160 MG tablet Take 1 tablet by mouth 2 (two) times daily.    Marland Kitchen SYNTHROID 175 MCG tablet Take 1 tablet (175 mcg total) by mouth daily before breakfast. 90 tablet 1  . vitamin B-12 (CYANOCOBALAMIN) 500 MCG tablet Take 500 mcg by mouth daily.    Marland Kitchen VITAMIN E PO  Take 500 mg by mouth 2 (two) times a week.     . clopidogrel (PLAVIX) 75 MG tablet Take 1 tablet (75 mg total) by mouth daily. 90 tablet 3   No facility-administered medications prior to visit.     Allergies:   Demeclocycline, Lisinopril, and Tetracyclines & related   Social History   Socioeconomic History  . Marital status: Married    Spouse name: Not on file  . Number of children: Not on file  . Years of education: Not on file  . Highest education level: Not on file  Occupational History  . Not on file  Tobacco Use  . Smoking status: Never Smoker  . Smokeless tobacco: Never Used  Substance and Sexual Activity  . Alcohol use: Yes    Alcohol/week: 1.0 standard drinks    Types: 1 Glasses of wine per week    Comment: social  . Drug use: No  . Sexual activity: Not on file  Other Topics Concern  . Not on file  Social History Narrative   Lives with husband.   Has 2 sons- both healthy.   Younger son lives in [place], [place] in order son lives in [place].   Social Determinants of Health   Financial Resource Strain:   . Difficulty of Paying Living Expenses:   Food Insecurity:   . Worried About Charity fundraiser in the Last Year:   . Arboriculturist in the Last Year:   Transportation Needs:   . Film/video editor (Medical):   Marland Kitchen Lack of Transportation (Non-Medical):   Physical Activity:   . Days of Exercise per Week:   . Minutes of Exercise per Session:   Stress:   . Feeling of Stress :   Social Connections:   . Frequency of Communication with Friends and Family:   . Frequency of Social Gatherings with Friends and Family:   . Attends Religious Services:   . Active Member of Clubs or Organizations:   . Attends Archivist Meetings:   Marland Kitchen Marital Status:      Family History:  The patient's family history includes Cancer in her father and mother; Stroke in her father and mother.   ROS:   Please see  the history of present illness.    ROS All other systems  reviewed and are negative.   PHYSICAL EXAM:   VS:  BP 138/82   Pulse 89   Ht 5\' 2"  (1.575 m)   Wt 173 lb 6.4 oz (78.7 kg)   SpO2 97%   BMI 31.72 kg/m    GEN: Well nourished, well developed, in no acute distress  HEENT: normal  Neck: no JVD, carotid bruits, or masses Cardiac: RRR; no murmurs, rubs, or gallops,no edema  Respiratory:  clear to auscultation bilaterally, normal work of breathing GI: soft, nontender, nondistended, + BS MS: no deformity or atrophy  Skin: warm and dry, no rash Neuro:  Alert and Oriented x 3, Strength and sensation are intact Psych: euthymic mood, full affect  Wt Readings from Last 3 Encounters:  12/14/19 173 lb 6.4 oz (78.7 kg)  11/20/19 175 lb 9.6 oz (79.7 kg)  08/22/19 172 lb 6.4 oz (78.2 kg)      Studies/Labs Reviewed:   EKG:  EKG is not ordered today.    Recent Labs: 05/25/2019: ALT 15; BUN 12; Creatinine, Ser 1.11; Potassium 4.8; Sodium 139 11/20/2019: TSH 0.74   Lipid Panel    Component Value Date/Time   CHOL 122 05/25/2019 1420   TRIG 121 05/25/2019 1420   HDL 47 05/25/2019 1420   CHOLHDL 2.6 05/25/2019 1420   CHOLHDL 2.6 12/10/2016 0954   VLDL 26 12/10/2016 0954   LDLCALC 53 05/25/2019 1420   Ecg today shows NSR with rate 89. Low voltage. Old septal infarct.  I have personally reviewed and interpreted this study.  Additional studies/ records that were reviewed today include:   Myoview 09/22/18: Study Highlights    The left ventricular ejection fraction is normal (55-65%).  Nuclear stress EF: 63%.  There was no ST segment deviation noted during stress.  Defect 1: There is a medium size severe perfusion defect present in the apical anterior, apical inferior, apical lateral and apex location.  Defect 2: There is a small defect of severe severity present in the mid anterior location.  Findings consistent with prior myocardial infarction.  This is an intermediate risk study.   Medium size severe perfusion defect in the  inferoapex, lateral apex and anteroapex, fixed without evidence of ischemia.  Small fixed perfusion defect in the anterior wall at the mid ventricle.  Evidence of infarction without evidence of ischemia.     Cath 10/11/2018  Prox LAD to Mid LAD lesion is 100% stenosed.  Previously placed Mid RCA stent (unknown type) is widely patent.  Post intervention, there is a 0% residual stenosis.  A drug-eluting stent was successfully placed using a STENT SYNERGY DES [id number].  LV end diastolic pressure is normal.   1. Single vessel occlusive CAD with CTO of the mid LAD 2. Continued patency of stents in the RCA 3. Normal LVEDP 4. Successful CTO PCI of the mid LAD with DES x 1  Plan: DAPT for one year. Patient is a candidate for same day discharge.     ASSESSMENT:    1. Coronary artery disease involving native coronary artery of native heart without angina pectoris   2. Type 2 diabetes mellitus without complication, with long-term current use of insulin ([place])   3. Essential hypertension   4. Dyslipidemia      PLAN:  In order of problems listed above:  1. CAD: S/p remote BMS of RCA. s/p DES of mid LAD in March 2020. May discontinue  Plavix at this time.  Continue ASA 81 mg daily.  She is asymptomatic.   2. Hypertension: BP is well controlled.   3. Hyperlipidemia: Continue statin therapy, Crestor 20 mg daily. LDL 53. Will repeat labs in 6 months.   4. DM2: Managed by Dr Loanne Drilling  5. Hypothyroidism: On Synthroid   Follow up in 6 months.  Medication Adjustments/Labs and Tests Ordered: Current medicines are reviewed at length with the patient today.  Concerns regarding medicines are outlined above.  Medication changes, Labs and Tests ordered today are listed in the Patient Instructions below. Patient Instructions  Stop taking Plavix.  Continue your other therapy  Follow up in 6 months with lab work      Signed, Kollen Armenti Martinique, MD  12/14/2019 11:45 AM    [facility] [place], [place], [place]  [address] Phone: [phone]; Fax: [phone]

## 2019-12-13 DIAGNOSIS — N390 Urinary tract infection, site not specified: Secondary | ICD-10-CM | POA: Diagnosis not present

## 2019-12-13 DIAGNOSIS — N819 Female genital prolapse, unspecified: Secondary | ICD-10-CM | POA: Diagnosis not present

## 2019-12-13 DIAGNOSIS — N952 Postmenopausal atrophic vaginitis: Secondary | ICD-10-CM | POA: Diagnosis not present

## 2019-12-14 ENCOUNTER — Ambulatory Visit (INDEPENDENT_AMBULATORY_CARE_PROVIDER_SITE_OTHER): Payer: Medicare Other | Admitting: Cardiology

## 2019-12-14 ENCOUNTER — Other Ambulatory Visit: Payer: Self-pay

## 2019-12-14 ENCOUNTER — Encounter: Payer: Self-pay | Admitting: Cardiology

## 2019-12-14 VITALS — BP 138/82 | HR 89 | Ht 62.0 in | Wt 173.4 lb

## 2019-12-14 DIAGNOSIS — E119 Type 2 diabetes mellitus without complications: Secondary | ICD-10-CM

## 2019-12-14 DIAGNOSIS — I2510 Atherosclerotic heart disease of native coronary artery without angina pectoris: Secondary | ICD-10-CM | POA: Diagnosis not present

## 2019-12-14 DIAGNOSIS — I10 Essential (primary) hypertension: Secondary | ICD-10-CM | POA: Diagnosis not present

## 2019-12-14 DIAGNOSIS — Z794 Long term (current) use of insulin: Secondary | ICD-10-CM

## 2019-12-14 DIAGNOSIS — E785 Hyperlipidemia, unspecified: Secondary | ICD-10-CM

## 2019-12-14 NOTE — Addendum Note (Signed)
Addended by: Kathyrn Lass on: 12/14/2019 11:52 AM   Modules accepted: Orders

## 2019-12-14 NOTE — Patient Instructions (Signed)
Stop taking Plavix.  Continue your other therapy  Follow up in 6 months with lab work

## 2019-12-26 DIAGNOSIS — H524 Presbyopia: Secondary | ICD-10-CM | POA: Diagnosis not present

## 2019-12-26 DIAGNOSIS — Z961 Presence of intraocular lens: Secondary | ICD-10-CM | POA: Diagnosis not present

## 2019-12-26 DIAGNOSIS — E119 Type 2 diabetes mellitus without complications: Secondary | ICD-10-CM | POA: Diagnosis not present

## 2019-12-26 LAB — HM DIABETES EYE EXAM

## 2019-12-27 ENCOUNTER — Ambulatory Visit: Payer: Medicare Other | Admitting: Podiatry

## 2019-12-27 DIAGNOSIS — N819 Female genital prolapse, unspecified: Secondary | ICD-10-CM | POA: Diagnosis not present

## 2020-01-01 ENCOUNTER — Other Ambulatory Visit: Payer: Self-pay | Admitting: Endocrinology

## 2020-01-02 ENCOUNTER — Telehealth: Payer: Self-pay

## 2020-01-02 NOTE — Telephone Encounter (Signed)
PA is required for Humulin [employer].  Novolog Kwikpen and does not require PA and is a covered alternative. Would you like to change to this medication?  Other covered alternatives that do not require PA are Basaglar and Levemir, and Novolog 70/30.

## 2020-01-03 ENCOUNTER — Other Ambulatory Visit: Payer: Self-pay

## 2020-01-03 DIAGNOSIS — E119 Type 2 diabetes mellitus without complications: Secondary | ICD-10-CM

## 2020-01-03 MED ORDER — NOVOLIN N FLEXPEN 100 UNIT/ML ~~LOC~~ SUPN: 35.0000 [IU] | PEN_INJECTOR | SUBCUTANEOUS | 2 refills | Status: DC

## 2020-01-03 NOTE — Telephone Encounter (Signed)
Novolin N pen is fine

## 2020-01-03 NOTE — Telephone Encounter (Signed)
Per Dr. Cordelia Pen orders, Rx changed to covered alternative as follows:  Outpatient Medication Detail   Disp Refills Start End   Insulin NPH, Human,, Isophane, (NOVOLIN N FLEXPEN) 100 UNIT/ML Kiwkpen 11 mL 2 01/03/2020    Sig - Route: Inject 35 Units into the skin every morning. E11.9 - Subcutaneous   Sent to pharmacy as: Insulin NPH, Human,, Isophane, (NOVOLIN N FLEXPEN) 100 UNIT/ML Kiwkpen   E-Prescribing Status: Receipt confirmed by pharmacy (01/03/2020  2:58 PM EDT)

## 2020-01-08 ENCOUNTER — Telehealth: Payer: Self-pay | Admitting: Endocrinology

## 2020-01-08 DIAGNOSIS — E119 Type 2 diabetes mellitus without complications: Secondary | ICD-10-CM

## 2020-01-08 NOTE — Telephone Encounter (Signed)
I called pt and informed her that a PA has been started for insulin pens.  Pt said that she has 2 days left on insulin currently on.  Please see message on 01/04/2020

## 2020-01-08 NOTE — Telephone Encounter (Signed)
Patient requests to be called at ph# [phone]  re: Patient does not want to use vials/syringe for insulin. Patient only wants to use insulin pens. Patient states a message may be left on patient's voice mail.

## 2020-01-09 MED ORDER — NOVOLIN N FLEXPEN 100 UNIT/ML ~~LOC~~ SUPN: 35.0000 [IU] | PEN_INJECTOR | SUBCUTANEOUS | 2 refills | Status: DC

## 2020-01-09 NOTE — Addendum Note (Signed)
Addended by: Cardell Peach I on: 01/09/2020 04:13 PM   Modules accepted: Orders

## 2020-01-09 NOTE — Telephone Encounter (Signed)
Rx sent to [facility]

## 2020-01-09 NOTE — Telephone Encounter (Signed)
Called Alicia Medina and informed her that a PA was not completed. Dr. Loanne Drilling elected to change Humunlin N to Novolin N flexpens. This was sent to Alicia Medina's pharmacy and she verbalized understanding of this. Alicia Medina will contact her pharmacy.

## 2020-01-09 NOTE — Telephone Encounter (Signed)
Patient states she needed her Rx for the Novolin N sent to   [facility] [place],  [address] Ph# [phone]  - she said her pharmacy at [facility] does not carry this right now.

## 2020-01-11 DIAGNOSIS — M25561 Pain in right knee: Secondary | ICD-10-CM | POA: Diagnosis not present

## 2020-02-07 ENCOUNTER — Other Ambulatory Visit: Payer: Self-pay | Admitting: Obstetrics & Gynecology

## 2020-02-07 DIAGNOSIS — Z1231 Encounter for screening mammogram for malignant neoplasm of breast: Secondary | ICD-10-CM

## 2020-02-14 DIAGNOSIS — M79671 Pain in right foot: Secondary | ICD-10-CM | POA: Diagnosis not present

## 2020-02-14 DIAGNOSIS — M25561 Pain in right knee: Secondary | ICD-10-CM | POA: Diagnosis not present

## 2020-02-14 DIAGNOSIS — M25571 Pain in right ankle and joints of right foot: Secondary | ICD-10-CM | POA: Diagnosis not present

## 2020-02-20 DIAGNOSIS — M79671 Pain in right foot: Secondary | ICD-10-CM | POA: Diagnosis not present

## 2020-02-20 DIAGNOSIS — M25571 Pain in right ankle and joints of right foot: Secondary | ICD-10-CM | POA: Diagnosis not present

## 2020-02-20 DIAGNOSIS — M25561 Pain in right knee: Secondary | ICD-10-CM | POA: Diagnosis not present

## 2020-02-21 ENCOUNTER — Encounter: Payer: Self-pay | Admitting: Endocrinology

## 2020-02-21 ENCOUNTER — Ambulatory Visit (INDEPENDENT_AMBULATORY_CARE_PROVIDER_SITE_OTHER): Payer: Medicare Other | Admitting: Endocrinology

## 2020-02-21 ENCOUNTER — Other Ambulatory Visit: Payer: Self-pay

## 2020-02-21 VITALS — BP 140/78 | HR 99 | Ht 62.0 in | Wt 167.6 lb

## 2020-02-21 DIAGNOSIS — E119 Type 2 diabetes mellitus without complications: Secondary | ICD-10-CM | POA: Diagnosis not present

## 2020-02-21 DIAGNOSIS — Z794 Long term (current) use of insulin: Secondary | ICD-10-CM | POA: Diagnosis not present

## 2020-02-21 DIAGNOSIS — I2510 Atherosclerotic heart disease of native coronary artery without angina pectoris: Secondary | ICD-10-CM

## 2020-02-21 DIAGNOSIS — R809 Proteinuria, unspecified: Secondary | ICD-10-CM | POA: Diagnosis not present

## 2020-02-21 DIAGNOSIS — E0829 Diabetes mellitus due to underlying condition with other diabetic kidney complication: Secondary | ICD-10-CM

## 2020-02-21 LAB — POCT GLYCOSYLATED HEMOGLOBIN (HGB A1C): Hemoglobin A1C: 8.5 % — AB (ref 4.0–5.6)

## 2020-02-21 MED ORDER — TRULICITY 0.75 MG/0.5ML ~~LOC~~ SOAJ: 0.7500 mg | SUBCUTANEOUS | 11 refills | Status: DC

## 2020-02-21 MED ORDER — NOVOLIN N FLEXPEN 100 UNIT/ML ~~LOC~~ SUPN: 25.0000 [IU] | PEN_INJECTOR | SUBCUTANEOUS | 2 refills | Status: DC

## 2020-02-21 NOTE — Progress Notes (Signed)
Subjective:    Patient ID: Alicia Medina, female    DOB: [birth year], 86 y.o.   MRN: [id number]  HPI Pt returns for f/u of diabetes mellitus:  DM type: Insulin-requiring type 2 Dx'ed: 6761 Complications: CAD, PN, and stage 3 CRI.  Therapy: insulin since 2013.   GDM: never.  DKA: never.   Severe hypoglycemia: never.   Pancreatitis: never.  Pancreatic imaging: never Other: she declines multiple daily injections; She did not tolerate metformin (diarrhea); she changed lantus to levemir, then NPH, based on the pattern of cbg's.   Interval history: she brings a record of her cbg's which I have reviewed today.  cbg varies from 108-256.  There is no trend throughout the day. Pt says she misses the insulin approx 2-3 times per month.  pt states she feels well in general.   She had thyroidect in 2008 (benign pathol; she has been on synthroid since then;  she requests brand name synthroid).   Past Medical History:  Diagnosis Date  . Arthritis   . CAD (coronary artery disease) CARDIOLOGIST-  DR Martinique   08-28-2011 Inferior STEMI with BMS to the RCA, complicated by cardiogenic shock, VF, VDRF , temporary CHB and abrupt reocclusion of the RCA stent treated with a second BMS to the RCA. Has residual LAD disease. EF is 50%  . Diverticulosis of colon   . Hemorrhoids, internal, with bleeding   . Hyperlipidemia   . Hypertension   . Hypothyroidism, postsurgical   . S/p bare metal coronary artery stent    2013  . Type 2 diabetes mellitus ([place])   . Wears hearing aid    both ears    Past Surgical History:  Procedure Laterality Date  . CARDIOVASCULAR STRESS TEST  10-03-2011  dr Martinique   normal perfusion / no ischemia/ ef 77%  . CARPAL TUNNEL RELEASE Right 01/25/2013   Procedure: CARPAL TUNNEL RELEASE;  Surgeon: Wynonia Sours, MD;  Location: [employer];  Service: Orthopedics;  Laterality: Right;  . CARPAL TUNNEL RELEASE Left 10/25/2013   Procedure: LEFT CARPAL TUNNEL RELEASE;  Surgeon:  Wynonia Sours, MD;  Location: [place];  Service: Orthopedics;  Laterality: Left;  . CATARACT EXTRACTION W/ INTRAOCULAR LENS  IMPLANT, BILATERAL  2007  . COLONOSCOPY  04-27-2003  . CORONARY ANGIOGRAM Left 08/28/2011   Procedure: CORONARY ANGIOGRAM;  Surgeon: Peter M Martinique, MD;  Location: [facility] CATH LAB;  Service: Cardiovascular;  Laterality: Left;  . CORONARY ANGIOPLASTY WITH STENT PLACEMENT  08-28-2011  dr Martinique   BMS to [facility] with abrupt reocclusion treated with second BMS/  LAD ostial 90%, proximal50-60%, first diagonal 80-90%  . CORONARY STENT INTERVENTION N/A 10/11/2018   Procedure: CORONARY STENT INTERVENTION;  Surgeon: Martinique, Peter M, MD;  Location: [place] CV LAB;  Service: Cardiovascular;  Laterality: N/A;  . LEFT HEART CATH AND CORONARY ANGIOGRAPHY N/A 10/11/2018   Procedure: LEFT HEART CATH AND CORONARY ANGIOGRAPHY;  Surgeon: Martinique, Peter M, MD;  Location: [place] CV LAB;  Service: Cardiovascular;  Laterality: N/A;  . LEFT HEART CATHETERIZATION WITH CORONARY ANGIOGRAM N/A 08/28/2011   Procedure: LEFT HEART CATHETERIZATION WITH CORONARY ANGIOGRAM;  Surgeon: Peter M Martinique, MD;  Location: [facility] CATH LAB;  Service: Cardiovascular;  Laterality: N/A;  . PERCUTANEOUS CORONARY STENT INTERVENTION (PCI-S) Left 08/28/2011   Procedure: PERCUTANEOUS CORONARY STENT INTERVENTION (PCI-S);  Surgeon: Peter M Martinique, MD;  Location: [facility] CATH LAB;  Service: Cardiovascular;  Laterality: Left;  . TOTAL THYROIDECTOMY Bilateral 05-02-2007  multinodular goiter  . TRANSANAL HEMORRHOIDAL DEARTERIALIZATION N/A 03/28/2014   Procedure: TRANSANAL HEMORRHOIDAL DEARTERIALIZATION;  Surgeon: Leighton Ruff, MD;  Location: [facility];  Service: General;  Laterality: N/A;  . TRANSTHORACIC ECHOCARDIOGRAM  08-30-2011   mild LVH/ ef 49-20%/  grade I diastolic dysfunction  . TRIGGER FINGER RELEASE Right 01/25/2013   Procedure: RELEASE TRIGGER FINGER/A-1 PULLEY RIGHT INDEX FINGER;  Surgeon: Wynonia Sours, MD;  Location: [place];  Service: Orthopedics;  Laterality: Right;  . TRIGGER FINGER RELEASE Left 10/25/2013   Procedure: RELEASE A-1 PULLEY LEFT MIDDLE FINGER;  Surgeon: Wynonia Sours, MD;  Location: [place];  Service: Orthopedics;  Laterality: Left;  . TRIGGER FINGER RELEASE Left 12/04/2014   Procedure: RELEASE TRIGGER FINGER/A-1 PULLEY LEFT RING FINGER;  Surgeon: Daryll Brod, MD;  Location: [place];  Service: Orthopedics;  Laterality: Left;  . TRIGGER FINGER RELEASE Right 03/30/2017   Procedure: RELEASE TRIGGER FINGER/A-1 PULLEY RIGHT RING FINGER AND RIGHT SMALL FINGER;  Surgeon: Daryll Brod, MD;  Location: [place];  Service: Orthopedics;  Laterality: Right;    Social History   Socioeconomic History  . Marital status: Married    Spouse name: Not on file  . Number of children: Not on file  . Years of education: Not on file  . Highest education level: Not on file  Occupational History  . Not on file  Tobacco Use  . Smoking status: Never Smoker  . Smokeless tobacco: Never Used  Vaping Use  . Vaping Use: Never used  Substance and Sexual Activity  . Alcohol use: Yes    Alcohol/week: 1.0 standard drink    Types: 1 Glasses of wine per week    Comment: social  . Drug use: No  . Sexual activity: Not on file  Other Topics Concern  . Not on file  Social History Narrative   Lives with husband.   Has 2 sons- both healthy.   Younger son lives in [place], [place] in order son lives in [place].   Social Determinants of Health   Financial Resource Strain:   . Difficulty of Paying Living Expenses:   Food Insecurity:   . Worried About Charity fundraiser in the Last Year:   . Arboriculturist in the Last Year:   Transportation Needs:   . Film/video editor (Medical):   Marland Kitchen Lack of Transportation (Non-Medical):   Physical Activity:   . Days of Exercise per Week:   . Minutes of Exercise per Session:   Stress:   .  Feeling of Stress :   Social Connections:   . Frequency of Communication with Friends and Family:   . Frequency of Social Gatherings with Friends and Family:   . Attends Religious Services:   . Active Member of Clubs or Organizations:   . Attends Archivist Meetings:   Marland Kitchen Marital Status:   Intimate Partner Violence:   . Fear of Current or Ex-Partner:   . Emotionally Abused:   Marland Kitchen Physically Abused:   . Sexually Abused:     Current Outpatient Medications on File Prior to Visit  Medication Sig Dispense Refill  . acetaminophen (TYLENOL) 500 MG tablet Take 1,000 mg by mouth at bedtime.    . Ascorbic Acid (VITAMIN C) 1000 MG tablet Take 1,000 mg by mouth every evening.    . Aspirin Buf,CaCarb-MgCarb-MgO, 81 MG TABS aspirin 81 mg tablet  Take by oral route.    Marland Kitchen  aspirin EC 81 MG tablet Take 81 mg by mouth daily.    . Biotin w/ Vitamins C & E (HAIR/SKIN/NAILS PO) Take 1 tablet by mouth 2 (two) times daily.    Marland Kitchen bismuth subsalicylate (PEPTO BISMOL) 262 MG chewable tablet Chew 131-262 mg by mouth as directed.     . calcium carbonate (CALCIUM 600) 1500 (600 Ca) MG TABS tablet Take 600 mg of elemental calcium by mouth once a week.     . Cholecalciferol (VITAMIN D-3) 125 MCG (5000 UT) TABS Take 5,000 Units by mouth daily.    . Chromium Picolinate 1000 MCG TABS Take 1,000 mcg by mouth daily.    . Coenzyme Q10 200 MG TABS Take 200 mg by mouth daily.     . COLLAGEN PO Take 1,000 mg by mouth 2 (two) times daily.    Marland Kitchen ESTRACE VAGINAL 0.1 MG/GM vaginal cream     . folic acid (FOLVITE) 681 MCG tablet Take 800 mcg by mouth daily.    Marland Kitchen glucose blood (TRUE METRIX BLOOD GLUCOSE TEST) test strip 1 each by Other route 2 (two) times daily. And lancets 2/day 200 each 3  . loperamide (IMODIUM A-D) 2 MG tablet Take 1-2 mg by mouth as directed.     . loratadine (CLARITIN) 10 MG tablet Take 10 mg by mouth at bedtime.    Marland Kitchen losartan (COZAAR) 25 MG tablet Take 1 tablet (25 mg total) by mouth every morning. 90  tablet 3  . Magnesium 250 MG TABS Take 250 mg by mouth daily.    . metoprolol tartrate (LOPRESSOR) 50 MG tablet Take 1 tablet (50 mg total) by mouth 2 (two) times daily. 180 tablet 3  . Multiple Vitamin (MULTIVITAMIN) tablet Take 1 tablet by mouth once a week.     . nitrofurantoin, macrocrystal-monohydrate, (MACROBID) 100 MG capsule Take 100 mg by mouth 2 (two) times daily.    . potassium chloride SA (KLOR-CON) 20 MEQ tablet Take 1 tablet (20 mEq total) by mouth daily. 90 tablet 3  . Psyllium (METAMUCIL PO) Take 1 capsule by mouth daily.    . rosuvastatin (CRESTOR) 20 MG tablet Take 1 tablet (20 mg total) by mouth daily. 90 tablet 3  . sulfamethoxazole-trimethoprim (BACTRIM DS) 800-160 MG tablet Take 1 tablet by mouth 2 (two) times daily.    Marland Kitchen SYNTHROID 175 MCG tablet Take 1 tablet (175 mcg total) by mouth daily before breakfast. 90 tablet 1  . vitamin B-12 (CYANOCOBALAMIN) 500 MCG tablet Take 500 mcg by mouth daily.    Marland Kitchen VITAMIN E PO Take 500 mg by mouth 2 (two) times a week.      No current facility-administered medications on file prior to visit.    Allergies  Allergen Reactions  . Demeclocycline Other (See Comments)    Severe stomach cramps Severe stomach cramps Severe stomach cramps  . Lisinopril Cough  . Tetracyclines & Related Other (See Comments)    Severe stomach cramps    Family History  Problem Relation Age of Onset  . Stroke Mother   . Cancer Mother        breast  . Cancer Father        lung  . Stroke Father   . Diabetes Neg Hx     BP (!) 140/78   Pulse 99   Ht 5\' 2"  (1.575 m)   Wt 167 lb 9.6 oz (76 kg)   SpO2 97%   BMI 30.65 kg/m    Review of Systems  Objective:   Physical Exam VITAL SIGNS:  See vs page GENERAL: no distress Pulses: dorsalis pedis intact bilat.   MSK: no deformity of the feet CV: 1+ right and trace left leg edema Skin:  no ulcer on the feet.  normal color and temp on the feet. Neuro: sensation is intact to touch on the  feet  Lab Results  Component Value Date   HGBA1C 8.5 (A) 02/21/2020       Assessment & Plan:  Insulin-requiring type 2 DM, with stage 3 CRI: worse.   HTN: is noted today.    Patient Instructions  Your blood pressure is high today.  Please see your primary care provider soon, to have it rechecked.   I have sent a prescription to your pharmacy, to add "Trulicity," and: Please reduce the insulin to 25 units each morning.   On this type of insulin schedule, you should eat meals on a regular schedule.  If a meal is missed or significantly delayed, your blood sugar could go low.  check your blood sugar twice a day.  vary the time of day when you check, between before the 3 meals, and at bedtime.  also check if you have symptoms of your blood sugar being too high or too low.  please keep a record of the readings and bring it to your next appointment here (or you can bring the meter itself).  You can write it on any piece of paper.  please call us sooner if your blood sugar goes below 70, or if you have a lot of readings over 200.  Please come back for a follow-up appointment in 2 months.

## 2020-02-21 NOTE — Patient Instructions (Addendum)
Your blood pressure is high today.  Please see your primary care provider soon, to have it rechecked.   I have sent a prescription to your pharmacy, to add "Trulicity," and: Please reduce the insulin to 25 units each morning.   On this type of insulin schedule, you should eat meals on a regular schedule.  If a meal is missed or significantly delayed, your blood sugar could go low.  check your blood sugar twice a day.  vary the time of day when you check, between before the 3 meals, and at bedtime.  also check if you have symptoms of your blood sugar being too high or too low.  please keep a record of the readings and bring it to your next appointment here (or you can bring the meter itself).  You can write it on any piece of paper.  please call us sooner if your blood sugar goes below 70, or if you have a lot of readings over 200.  Please come back for a follow-up appointment in 2 months.

## 2020-02-22 DIAGNOSIS — M25561 Pain in right knee: Secondary | ICD-10-CM | POA: Diagnosis not present

## 2020-02-22 DIAGNOSIS — M79671 Pain in right foot: Secondary | ICD-10-CM | POA: Diagnosis not present

## 2020-02-22 DIAGNOSIS — M25571 Pain in right ankle and joints of right foot: Secondary | ICD-10-CM | POA: Diagnosis not present

## 2020-02-27 DIAGNOSIS — M25561 Pain in right knee: Secondary | ICD-10-CM | POA: Diagnosis not present

## 2020-02-27 DIAGNOSIS — M25571 Pain in right ankle and joints of right foot: Secondary | ICD-10-CM | POA: Diagnosis not present

## 2020-02-27 DIAGNOSIS — M79671 Pain in right foot: Secondary | ICD-10-CM | POA: Diagnosis not present

## 2020-03-01 DIAGNOSIS — M79671 Pain in right foot: Secondary | ICD-10-CM | POA: Diagnosis not present

## 2020-03-01 DIAGNOSIS — M25561 Pain in right knee: Secondary | ICD-10-CM | POA: Diagnosis not present

## 2020-03-01 DIAGNOSIS — M25571 Pain in right ankle and joints of right foot: Secondary | ICD-10-CM | POA: Diagnosis not present

## 2020-03-05 DIAGNOSIS — M25571 Pain in right ankle and joints of right foot: Secondary | ICD-10-CM | POA: Diagnosis not present

## 2020-03-05 DIAGNOSIS — M25561 Pain in right knee: Secondary | ICD-10-CM | POA: Diagnosis not present

## 2020-03-05 DIAGNOSIS — M79671 Pain in right foot: Secondary | ICD-10-CM | POA: Diagnosis not present

## 2020-03-05 DIAGNOSIS — N819 Female genital prolapse, unspecified: Secondary | ICD-10-CM | POA: Diagnosis not present

## 2020-03-06 ENCOUNTER — Other Ambulatory Visit: Payer: Self-pay | Admitting: Endocrinology

## 2020-03-11 DIAGNOSIS — M25571 Pain in right ankle and joints of right foot: Secondary | ICD-10-CM | POA: Diagnosis not present

## 2020-03-11 DIAGNOSIS — M79671 Pain in right foot: Secondary | ICD-10-CM | POA: Diagnosis not present

## 2020-03-11 DIAGNOSIS — M25561 Pain in right knee: Secondary | ICD-10-CM | POA: Diagnosis not present

## 2020-03-14 DIAGNOSIS — M79671 Pain in right foot: Secondary | ICD-10-CM | POA: Diagnosis not present

## 2020-03-14 DIAGNOSIS — M25561 Pain in right knee: Secondary | ICD-10-CM | POA: Diagnosis not present

## 2020-03-14 DIAGNOSIS — M25571 Pain in right ankle and joints of right foot: Secondary | ICD-10-CM | POA: Diagnosis not present

## 2020-03-15 ENCOUNTER — Ambulatory Visit
Admission: RE | Admit: 2020-03-15 | Discharge: 2020-03-15 | Disposition: A | Discharge status: Home / Self Care | Payer: Medicare Other | Source: Ambulatory Visit | Attending: Obstetrics & Gynecology | Admitting: Obstetrics & Gynecology

## 2020-03-15 ENCOUNTER — Other Ambulatory Visit: Payer: Self-pay

## 2020-03-15 DIAGNOSIS — Z1231 Encounter for screening mammogram for malignant neoplasm of breast: Secondary | ICD-10-CM | POA: Diagnosis not present

## 2020-03-18 DIAGNOSIS — M79671 Pain in right foot: Secondary | ICD-10-CM | POA: Diagnosis not present

## 2020-03-18 DIAGNOSIS — M25571 Pain in right ankle and joints of right foot: Secondary | ICD-10-CM | POA: Diagnosis not present

## 2020-03-18 DIAGNOSIS — M25561 Pain in right knee: Secondary | ICD-10-CM | POA: Diagnosis not present

## 2020-05-02 ENCOUNTER — Ambulatory Visit (INDEPENDENT_AMBULATORY_CARE_PROVIDER_SITE_OTHER): Payer: Medicare Other | Admitting: Endocrinology

## 2020-05-02 ENCOUNTER — Other Ambulatory Visit: Payer: Self-pay

## 2020-05-02 ENCOUNTER — Encounter: Payer: Self-pay | Admitting: Endocrinology

## 2020-05-02 VITALS — BP 124/70 | HR 84 | Ht 62.0 in | Wt 150.0 lb

## 2020-05-02 DIAGNOSIS — Z794 Long term (current) use of insulin: Secondary | ICD-10-CM

## 2020-05-02 DIAGNOSIS — E119 Type 2 diabetes mellitus without complications: Secondary | ICD-10-CM | POA: Diagnosis not present

## 2020-05-02 DIAGNOSIS — R809 Proteinuria, unspecified: Secondary | ICD-10-CM | POA: Diagnosis not present

## 2020-05-02 DIAGNOSIS — E0829 Diabetes mellitus due to underlying condition with other diabetic kidney complication: Secondary | ICD-10-CM

## 2020-05-02 DIAGNOSIS — I2510 Atherosclerotic heart disease of native coronary artery without angina pectoris: Secondary | ICD-10-CM

## 2020-05-02 LAB — POCT GLYCOSYLATED HEMOGLOBIN (HGB A1C): Hemoglobin A1C: 7.8 % — AB (ref 4.0–5.6)

## 2020-05-02 MED ORDER — TRULICITY 1.5 MG/0.5ML ~~LOC~~ SOAJ
1.5000 mg | SUBCUTANEOUS | 3 refills | Status: DC
Start: 2020-05-02 — End: 2020-09-02

## 2020-05-02 MED ORDER — NOVOLIN N FLEXPEN 100 UNIT/ML ~~LOC~~ SUPN: 15.0000 [IU] | PEN_INJECTOR | SUBCUTANEOUS | 2 refills | Status: DC

## 2020-05-02 NOTE — Patient Instructions (Addendum)
I have sent a prescription to your pharmacy, to double the Trulicity, and:  Please reduce the insulin to 15 units each morning.   On this type of insulin schedule, you should eat meals on a regular schedule.  If a meal is missed or significantly delayed, your blood sugar could go low.  check your blood sugar twice a day.  vary the time of day when you check, between before the 3 meals, and at bedtime.  also check if you have symptoms of your blood sugar being too high or too low.  please keep a record of the readings and bring it to your next appointment here (or you can bring the meter itself).  You can write it on any piece of paper.  please call us sooner if your blood sugar goes below 70, or if you have a lot of readings over 200.   Please come back for a follow-up appointment in 2 months.

## 2020-05-02 NOTE — Progress Notes (Signed)
Subjective:    Patient ID: Alicia Medina, female    DOB: 1933/06/17, 86 y.o.   MRN: [id number]  HPI Pt returns for f/u of diabetes mellitus:  DM type: Insulin-requiring type 2 Dx'ed: 3976 Complications: CAD, PN, and stage 3 CRI.  Therapy: insulin since 2013.   GDM: never.  DKA: never.   Severe hypoglycemia: never.   Pancreatitis: never.  Pancreatic imaging: never Other: she declines multiple daily injections; She did not tolerate metformin (diarrhea); she changed lantus to levemir, then NPH, based on the pattern of cbg's.   Interval history: she brings a record of her cbg's which I have reviewed today.  cbg varies from 103-227.  There is no trend throughout the day. Pt says she seldom misses the insulin.  pt states she feels well in general.   She had thyroidect in 2008 (benign pathol; she has been on synthroid since then;  she requests brand name synthroid).   Past Medical History:  Diagnosis Date  . Arthritis   . CAD (coronary artery disease) CARDIOLOGIST-  DR Martinique   08-28-2011 Inferior STEMI with BMS to the RCA, complicated by cardiogenic shock, VF, VDRF , temporary CHB and abrupt reocclusion of the RCA stent treated with a second BMS to the RCA. Has residual LAD disease. EF is 50%  . Diverticulosis of colon   . Hemorrhoids, internal, with bleeding   . Hyperlipidemia   . Hypertension   . Hypothyroidism, postsurgical   . S/p bare metal coronary artery stent    2013  . Type 2 diabetes mellitus ([place])   . Wears hearing aid    both ears    Past Surgical History:  Procedure Laterality Date  . CARDIOVASCULAR STRESS TEST  10-03-2011  dr Martinique   normal perfusion / no ischemia/ ef 77%  . CARPAL TUNNEL RELEASE Right 01/25/2013   Procedure: CARPAL TUNNEL RELEASE;  Surgeon: Wynonia Sours, MD;  Location: [place];  Service: Orthopedics;  Laterality: Right;  . CARPAL TUNNEL RELEASE Left 10/25/2013   Procedure: LEFT CARPAL TUNNEL RELEASE;  Surgeon: Wynonia Sours, MD;   Location: [place];  Service: Orthopedics;  Laterality: Left;  . CATARACT EXTRACTION W/ INTRAOCULAR LENS  IMPLANT, BILATERAL  2007  . COLONOSCOPY  04-27-2003  . CORONARY ANGIOGRAM Left 08/28/2011   Procedure: CORONARY ANGIOGRAM;  Surgeon: Peter M Martinique, MD;  Location: [facility] CATH LAB;  Service: Cardiovascular;  Laterality: Left;  . CORONARY ANGIOPLASTY WITH STENT PLACEMENT  08-28-2011  dr Martinique   BMS to [facility] with abrupt reocclusion treated with second BMS/  LAD ostial 90%, proximal50-60%, first diagonal 80-90%  . CORONARY STENT INTERVENTION N/A 10/11/2018   Procedure: CORONARY STENT INTERVENTION;  Surgeon: Martinique, Peter M, MD;  Location: [place] CV LAB;  Service: Cardiovascular;  Laterality: N/A;  . LEFT HEART CATH AND CORONARY ANGIOGRAPHY N/A 10/11/2018   Procedure: LEFT HEART CATH AND CORONARY ANGIOGRAPHY;  Surgeon: Martinique, Peter M, MD;  Location: [place] CV LAB;  Service: Cardiovascular;  Laterality: N/A;  . LEFT HEART CATHETERIZATION WITH CORONARY ANGIOGRAM N/A 08/28/2011   Procedure: LEFT HEART CATHETERIZATION WITH CORONARY ANGIOGRAM;  Surgeon: Peter M Martinique, MD;  Location: [facility] CATH LAB;  Service: Cardiovascular;  Laterality: N/A;  . PERCUTANEOUS CORONARY STENT INTERVENTION (PCI-S) Left 08/28/2011   Procedure: PERCUTANEOUS CORONARY STENT INTERVENTION (PCI-S);  Surgeon: Peter M Martinique, MD;  Location: [facility] CATH LAB;  Service: Cardiovascular;  Laterality: Left;  . TOTAL THYROIDECTOMY Bilateral 05-02-2007   multinodular goiter  .  TRANSANAL HEMORRHOIDAL DEARTERIALIZATION N/A 03/28/2014   Procedure: TRANSANAL HEMORRHOIDAL DEARTERIALIZATION;  Surgeon: Leighton Ruff, MD;  Location: [facility];  Service: General;  Laterality: N/A;  . TRANSTHORACIC ECHOCARDIOGRAM  08-30-2011   mild LVH/ ef 41-32%/  grade I diastolic dysfunction  . TRIGGER FINGER RELEASE Right 01/25/2013   Procedure: RELEASE TRIGGER FINGER/A-1 PULLEY RIGHT INDEX FINGER;  Surgeon: Wynonia Sours, MD;  Location:  [place];  Service: Orthopedics;  Laterality: Right;  . TRIGGER FINGER RELEASE Left 10/25/2013   Procedure: RELEASE A-1 PULLEY LEFT MIDDLE FINGER;  Surgeon: Wynonia Sours, MD;  Location: [place];  Service: Orthopedics;  Laterality: Left;  . TRIGGER FINGER RELEASE Left 12/04/2014   Procedure: RELEASE TRIGGER FINGER/A-1 PULLEY LEFT RING FINGER;  Surgeon: Daryll Brod, MD;  Location: [place];  Service: Orthopedics;  Laterality: Left;  . TRIGGER FINGER RELEASE Right 03/30/2017   Procedure: RELEASE TRIGGER FINGER/A-1 PULLEY RIGHT RING FINGER AND RIGHT SMALL FINGER;  Surgeon: Daryll Brod, MD;  Location: [place];  Service: Orthopedics;  Laterality: Right;    Social History   Socioeconomic History  . Marital status: Married    Spouse name: Not on file  . Number of children: Not on file  . Years of education: Not on file  . Highest education level: Not on file  Occupational History  . Not on file  Tobacco Use  . Smoking status: Never Smoker  . Smokeless tobacco: Never Used  Vaping Use  . Vaping Use: Never used  Substance and Sexual Activity  . Alcohol use: Yes    Alcohol/week: 1.0 standard drink    Types: 1 Glasses of wine per week    Comment: social  . Drug use: No  . Sexual activity: Not on file  Other Topics Concern  . Not on file  Social History Narrative   Lives with husband.   Has 2 sons- both healthy.   Younger son lives in [place], [place] in order son lives in [place].   Social Determinants of Health   Financial Resource Strain:   . Difficulty of Paying Living Expenses: Not on file  Food Insecurity:   . Worried About Charity fundraiser in the Last Year: Not on file  . Ran Out of Food in the Last Year: Not on file  Transportation Needs:   . Lack of Transportation (Medical): Not on file  . Lack of Transportation (Non-Medical): Not on file  Physical Activity:   . Days of Exercise per Week: Not on file  .  Minutes of Exercise per Session: Not on file  Stress:   . Feeling of Stress : Not on file  Social Connections:   . Frequency of Communication with Friends and Family: Not on file  . Frequency of Social Gatherings with Friends and Family: Not on file  . Attends Religious Services: Not on file  . Active Member of Clubs or Organizations: Not on file  . Attends Archivist Meetings: Not on file  . Marital Status: Not on file  Intimate Partner Violence:   . Fear of Current or Ex-Partner: Not on file  . Emotionally Abused: Not on file  . Physically Abused: Not on file  . Sexually Abused: Not on file    Current Outpatient Medications on File Prior to Visit  Medication Sig Dispense Refill  . acetaminophen (TYLENOL) 500 MG tablet Take 1,000 mg by mouth at bedtime.    . Ascorbic Acid (VITAMIN C)  1000 MG tablet Take 1,000 mg by mouth every evening.    . Aspirin Buf,CaCarb-MgCarb-MgO, 81 MG TABS aspirin 81 mg tablet  Take by oral route.    Marland Kitchen aspirin EC 81 MG tablet Take 81 mg by mouth daily.    . Biotin w/ Vitamins C & E (HAIR/SKIN/NAILS PO) Take 1 tablet by mouth 2 (two) times daily.    Marland Kitchen bismuth subsalicylate (PEPTO BISMOL) 262 MG chewable tablet Chew 131-262 mg by mouth as directed.     . calcium carbonate (CALCIUM 600) 1500 (600 Ca) MG TABS tablet Take 600 mg of elemental calcium by mouth once a week.     . Cholecalciferol (VITAMIN D-3) 125 MCG (5000 UT) TABS Take 5,000 Units by mouth daily.    . Chromium Picolinate 1000 MCG TABS Take 1,000 mcg by mouth daily.    . Coenzyme Q10 200 MG TABS Take 200 mg by mouth daily.     . COLLAGEN PO Take 1,000 mg by mouth 2 (two) times daily.    Marland Kitchen ESTRACE VAGINAL 0.1 MG/GM vaginal cream     . folic acid (FOLVITE) 294 MCG tablet Take 800 mcg by mouth daily.    Marland Kitchen glucose blood (TRUE METRIX BLOOD GLUCOSE TEST) test strip 1 each by Other route 2 (two) times daily. And lancets 2/day 200 each 3  . loperamide (IMODIUM A-D) 2 MG tablet Take 1-2 mg by  mouth as directed.     . loratadine (CLARITIN) 10 MG tablet Take 10 mg by mouth at bedtime.    Marland Kitchen losartan (COZAAR) 25 MG tablet Take 1 tablet (25 mg total) by mouth every morning. 90 tablet 3  . Magnesium 250 MG TABS Take 250 mg by mouth daily.    . metoprolol tartrate (LOPRESSOR) 50 MG tablet Take 1 tablet (50 mg total) by mouth 2 (two) times daily. 180 tablet 3  . Multiple Vitamin (MULTIVITAMIN) tablet Take 1 tablet by mouth once a week.     . nitrofurantoin, macrocrystal-monohydrate, (MACROBID) 100 MG capsule Take 100 mg by mouth 2 (two) times daily.    . potassium chloride SA (KLOR-CON) 20 MEQ tablet Take 1 tablet (20 mEq total) by mouth daily. 90 tablet 3  . Psyllium (METAMUCIL PO) Take 1 capsule by mouth daily.    . rosuvastatin (CRESTOR) 20 MG tablet Take 1 tablet (20 mg total) by mouth daily. 90 tablet 3  . sulfamethoxazole-trimethoprim (BACTRIM DS) 800-160 MG tablet Take 1 tablet by mouth 2 (two) times daily.    Marland Kitchen SYNTHROID 175 MCG tablet TAKE 1 TABLET (175 MCG TOTAL) BY MOUTH DAILY BEFORE BREAKFAST. 90 tablet 1  . vitamin B-12 (CYANOCOBALAMIN) 500 MCG tablet Take 500 mcg by mouth daily.    Marland Kitchen VITAMIN E PO Take 500 mg by mouth 2 (two) times a week.      No current facility-administered medications on file prior to visit.    Allergies  Allergen Reactions  . Demeclocycline Other (See Comments)    Severe stomach cramps Severe stomach cramps Severe stomach cramps  . Lisinopril Cough  . Tetracyclines & Related Other (See Comments)    Severe stomach cramps    Family History  Problem Relation Age of Onset  . Stroke Mother   . Cancer Mother        breast  . Cancer Father        lung  . Stroke Father   . Diabetes Neg Hx     BP 124/70   Pulse 84   Ht  5\' 2"  (1.575 m)   Wt 150 lb (68 kg)   SpO2 96%   BMI 27.44 kg/m    Review of Systems She denies hypoglycemia and nausea    Objective:   Physical Exam VITAL SIGNS:  See vs page GENERAL: no distress Pulses: dorsalis  pedis intact bilat.   MSK: no deformity of the feet CV: trace bilat leg edema, and bilat vv's Skin:  no ulcer on the feet.  normal color and temp on the feet. Neuro: sensation is intact to touch on the feet, but decreased from normal.      Lab Results  Component Value Date   TSH 0.74 11/20/2019   A1c=7.8%    Assessment & Plan:  Insulin-requiring type 2 DM: uncontrolled Weight loss: we'll follow this, but we should still increase Trulicity, to facilitate further loss.    Patient Instructions  I have sent a prescription to your pharmacy, to double the Trulicity, and:  Please reduce the insulin to 15 units each morning.   On this type of insulin schedule, you should eat meals on a regular schedule.  If a meal is missed or significantly delayed, your blood sugar could go low.  check your blood sugar twice a day.  vary the time of day when you check, between before the 3 meals, and at bedtime.  also check if you have symptoms of your blood sugar being too high or too low.  please keep a record of the readings and bring it to your next appointment here (or you can bring the meter itself).  You can write it on any piece of paper.  please call us sooner if your blood sugar goes below 70, or if you have a lot of readings over 200.   Please come back for a follow-up appointment in 2 months.

## 2020-05-16 DIAGNOSIS — Z6828 Body mass index (BMI) 28.0-28.9, adult: Secondary | ICD-10-CM | POA: Diagnosis not present

## 2020-05-16 DIAGNOSIS — Z779 Other contact with and (suspected) exposures hazardous to health: Secondary | ICD-10-CM | POA: Diagnosis not present

## 2020-05-16 DIAGNOSIS — R8290 Unspecified abnormal findings in urine: Secondary | ICD-10-CM | POA: Diagnosis not present

## 2020-05-16 DIAGNOSIS — Z124 Encounter for screening for malignant neoplasm of cervix: Secondary | ICD-10-CM | POA: Diagnosis not present

## 2020-05-16 DIAGNOSIS — N390 Urinary tract infection, site not specified: Secondary | ICD-10-CM | POA: Diagnosis not present

## 2020-05-24 DIAGNOSIS — Z23 Encounter for immunization: Secondary | ICD-10-CM | POA: Diagnosis not present

## 2020-05-29 DIAGNOSIS — N819 Female genital prolapse, unspecified: Secondary | ICD-10-CM | POA: Diagnosis not present

## 2020-06-10 NOTE — Progress Notes (Signed)
Cardiology Office Note    Date:  06/13/2020   ID:  Alicia Medina, DOB [birth year], MRN [id number]  PCP:  Pcp, No  Cardiologist:  Dr. Martinique   Chief Complaint  Patient presents with  . Coronary Artery Disease    History of Present Illness:  Alicia Medina is a 86 y.o. female with PMH of CAD, HTN, HLD, hypothyroidism, and DM II. patient had a inferior MI treated with BMS to RCA in 08/2011.  She required defibrillation multiple times and her hospitalization was complicated by cardiogenic shock, V. fib, VDRF, and complete heart block requiring temporary pacemaker.  Unfortunately, she had abrupt occlusion of the RCA stent and was treated with a second BMS to RCA.  Follow-up Myoview in March 2013 showed normal perfusion with EF of 76%.  Patient was seen in Feb 2020 with  intermittent chest pain radiating to her back between her shoulder blades.  Myoview showed moderate perfusion defect in the inferoapex, lateral apex, and and her apex that is new when compared to 2013.  The study was interpreted as intermediate risk with EF of 63%.  Patient ultimately underwent elective cardiac catheterization on 10/11/2018 which revealed a 100% proximal to mid LAD occlusion which was treated with 2.75 x 28 mm Synergy DES, widely patent mid RCA stent.   On follow up today she is doing well. She denies any significant chest pain or dyspnea. No palpitations. Her husband has been ill. She just had a uterine Bx this week.  Past Medical History:  Diagnosis Date  . Arthritis   . CAD (coronary artery disease) CARDIOLOGIST-  DR Martinique   08-28-2011 Inferior STEMI with BMS to the RCA, complicated by cardiogenic shock, VF, VDRF , temporary CHB and abrupt reocclusion of the RCA stent treated with a second BMS to the RCA. Has residual LAD disease. EF is 50%  . Diverticulosis of colon   . Hemorrhoids, internal, with bleeding   . Hyperlipidemia   . Hypertension   . Hypothyroidism, postsurgical   . S/p bare metal coronary  artery stent    2013  . Type 2 diabetes mellitus ([place])   . Wears hearing aid    both ears    Past Surgical History:  Procedure Laterality Date  . CARDIOVASCULAR STRESS TEST  10-03-2011  dr Martinique   normal perfusion / no ischemia/ ef 77%  . CARPAL TUNNEL RELEASE Right 01/25/2013   Procedure: CARPAL TUNNEL RELEASE;  Surgeon: Wynonia Sours, MD;  Location: [place];  Service: Orthopedics;  Laterality: Right;  . CARPAL TUNNEL RELEASE Left 10/25/2013   Procedure: LEFT CARPAL TUNNEL RELEASE;  Surgeon: Wynonia Sours, MD;  Location: [place];  Service: Orthopedics;  Laterality: Left;  . CATARACT EXTRACTION W/ INTRAOCULAR LENS  IMPLANT, BILATERAL  2007  . COLONOSCOPY  04-27-2003  . CORONARY ANGIOGRAM Left 08/28/2011   Procedure: CORONARY ANGIOGRAM;  Surgeon: Tiara Bartoli M Martinique, MD;  Location: [facility] CATH LAB;  Service: Cardiovascular;  Laterality: Left;  . CORONARY ANGIOPLASTY WITH STENT PLACEMENT  08-28-2011  dr Martinique   BMS to [facility] with abrupt reocclusion treated with second BMS/  LAD ostial 90%, proximal50-60%, first diagonal 80-90%  . CORONARY STENT INTERVENTION N/A 10/11/2018   Procedure: CORONARY STENT INTERVENTION;  Surgeon: Martinique, Willys Salvino M, MD;  Location: [place] CV LAB;  Service: Cardiovascular;  Laterality: N/A;  . LEFT HEART CATH AND CORONARY ANGIOGRAPHY N/A 10/11/2018   Procedure: LEFT HEART CATH AND CORONARY ANGIOGRAPHY;  Surgeon: Martinique, Dorlene Footman  M, MD;  Location: [place] CV LAB;  Service: Cardiovascular;  Laterality: N/A;  . LEFT HEART CATHETERIZATION WITH CORONARY ANGIOGRAM N/A 08/28/2011   Procedure: LEFT HEART CATHETERIZATION WITH CORONARY ANGIOGRAM;  Surgeon: Senia Even M Martinique, MD;  Location: [facility] CATH LAB;  Service: Cardiovascular;  Laterality: N/A;  . PERCUTANEOUS CORONARY STENT INTERVENTION (PCI-S) Left 08/28/2011   Procedure: PERCUTANEOUS CORONARY STENT INTERVENTION (PCI-S);  Surgeon: Crystalann Korf M Martinique, MD;  Location: [facility] CATH LAB;  Service: Cardiovascular;   Laterality: Left;  . TOTAL THYROIDECTOMY Bilateral 05-02-2007   multinodular goiter  . TRANSANAL HEMORRHOIDAL DEARTERIALIZATION N/A 03/28/2014   Procedure: TRANSANAL HEMORRHOIDAL DEARTERIALIZATION;  Surgeon: Leighton Ruff, MD;  Location: [facility];  Service: General;  Laterality: N/A;  . TRANSTHORACIC ECHOCARDIOGRAM  08-30-2011   mild LVH/ ef 54-09%/  grade I diastolic dysfunction  . TRIGGER FINGER RELEASE Right 01/25/2013   Procedure: RELEASE TRIGGER FINGER/A-1 PULLEY RIGHT INDEX FINGER;  Surgeon: Wynonia Sours, MD;  Location: [place];  Service: Orthopedics;  Laterality: Right;  . TRIGGER FINGER RELEASE Left 10/25/2013   Procedure: RELEASE A-1 PULLEY LEFT MIDDLE FINGER;  Surgeon: Wynonia Sours, MD;  Location: [place];  Service: Orthopedics;  Laterality: Left;  . TRIGGER FINGER RELEASE Left 12/04/2014   Procedure: RELEASE TRIGGER FINGER/A-1 PULLEY LEFT RING FINGER;  Surgeon: Daryll Brod, MD;  Location: [place];  Service: Orthopedics;  Laterality: Left;  . TRIGGER FINGER RELEASE Right 03/30/2017   Procedure: RELEASE TRIGGER FINGER/A-1 PULLEY RIGHT RING FINGER AND RIGHT SMALL FINGER;  Surgeon: Daryll Brod, MD;  Location: [place];  Service: Orthopedics;  Laterality: Right;    Current Medications: Outpatient Medications Prior to Visit  Medication Sig Dispense Refill  . acetaminophen (TYLENOL) 500 MG tablet Take 1,000 mg by mouth at bedtime.    . Ascorbic Acid (VITAMIN C) 1000 MG tablet Take 1,000 mg by mouth every evening.    . Aspirin Buf,CaCarb-MgCarb-MgO, 81 MG TABS aspirin 81 mg tablet  Take by oral route.    Marland Kitchen aspirin EC 81 MG tablet Take 81 mg by mouth daily.    . Biotin w/ Vitamins C & E (HAIR/SKIN/NAILS PO) Take 1 tablet by mouth 2 (two) times daily.    Marland Kitchen bismuth subsalicylate (PEPTO BISMOL) 262 MG chewable tablet Chew 131-262 mg by mouth as directed.     . calcium carbonate (CALCIUM 600) 1500 (600 Ca) MG TABS  tablet Take 600 mg of elemental calcium by mouth once a week.     . Cholecalciferol (VITAMIN D-3) 125 MCG (5000 UT) TABS Take 5,000 Units by mouth daily.    . Chromium Picolinate 1000 MCG TABS Take 1,000 mcg by mouth daily.    . Coenzyme Q10 200 MG TABS Take 200 mg by mouth daily.     . COLLAGEN PO Take 1,000 mg by mouth 2 (two) times daily.    . Dulaglutide (TRULICITY) 1.5 WJ/1.9JY SOPN Inject 1.5 mg into the skin once a week. 6 mL 3  . ESTRACE VAGINAL 0.1 MG/GM vaginal cream     . folic acid (FOLVITE) 782 MCG tablet Take 800 mcg by mouth daily.    Marland Kitchen glucose blood (TRUE METRIX BLOOD GLUCOSE TEST) test strip 1 each by Other route 2 (two) times daily. And lancets 2/day 200 each 3  . Insulin NPH, Human,, Isophane, (NOVOLIN N FLEXPEN) 100 UNIT/ML Kiwkpen Inject 15 Units into the skin every morning. 15 mL 2  . loperamide (IMODIUM A-D) 2 MG tablet  Take 1-2 mg by mouth as directed.     . loratadine (CLARITIN) 10 MG tablet Take 10 mg by mouth at bedtime.    Marland Kitchen losartan (COZAAR) 25 MG tablet TAKE 1 TABLET (25 MG TOTAL) BY MOUTH EVERY MORNING. 90 tablet 3  . Magnesium 250 MG TABS Take 250 mg by mouth daily.    . metoprolol tartrate (LOPRESSOR) 50 MG tablet Take 1 tablet (50 mg total) by mouth 2 (two) times daily. 180 tablet 3  . Multiple Vitamin (MULTIVITAMIN) tablet Take 1 tablet by mouth once a week.     . nitrofurantoin, macrocrystal-monohydrate, (MACROBID) 100 MG capsule Take 100 mg by mouth 2 (two) times daily.    . Psyllium (METAMUCIL PO) Take 1 capsule by mouth daily.    . rosuvastatin (CRESTOR) 20 MG tablet Take 1 tablet (20 mg total) by mouth daily. 90 tablet 3  . sulfamethoxazole-trimethoprim (BACTRIM DS) 800-160 MG tablet Take 1 tablet by mouth 2 (two) times daily.    Marland Kitchen SYNTHROID 175 MCG tablet TAKE 1 TABLET (175 MCG TOTAL) BY MOUTH DAILY BEFORE BREAKFAST. 90 tablet 1  . vitamin B-12 (CYANOCOBALAMIN) 500 MCG tablet Take 500 mcg by mouth daily.    Marland Kitchen VITAMIN E PO Take 500 mg by mouth 2 (two)  times a week.     . potassium chloride SA (KLOR-CON) 20 MEQ tablet TAKE 1 TABLET (20 MEQ TOTAL) BY MOUTH DAILY. 90 tablet 3   No facility-administered medications prior to visit.     Allergies:   Demeclocycline, Lisinopril, and Tetracyclines & related   Social History   Socioeconomic History  . Marital status: Married    Spouse name: Not on file  . Number of children: Not on file  . Years of education: Not on file  . Highest education level: Not on file  Occupational History  . Not on file  Tobacco Use  . Smoking status: Never Smoker  . Smokeless tobacco: Never Used  Vaping Use  . Vaping Use: Never used  Substance and Sexual Activity  . Alcohol use: Yes    Alcohol/week: 1.0 standard drink    Types: 1 Glasses of wine per week    Comment: social  . Drug use: No  . Sexual activity: Not on file  Other Topics Concern  . Not on file  Social History Narrative   Lives with husband.   Has 2 sons- both healthy.   Younger son lives in [place], [place] in order son lives in [place].   Social Determinants of Health   Financial Resource Strain:   . Difficulty of Paying Living Expenses: Not on file  Food Insecurity:   . Worried About Charity fundraiser in the Last Year: Not on file  . Ran Out of Food in the Last Year: Not on file  Transportation Needs:   . Lack of Transportation (Medical): Not on file  . Lack of Transportation (Non-Medical): Not on file  Physical Activity:   . Days of Exercise per Week: Not on file  . Minutes of Exercise per Session: Not on file  Stress:   . Feeling of Stress : Not on file  Social Connections:   . Frequency of Communication with Friends and Family: Not on file  . Frequency of Social Gatherings with Friends and Family: Not on file  . Attends Religious Services: Not on file  . Active Member of Clubs or Organizations: Not on file  . Attends Archivist Meetings: Not on file  . Marital  Status: Not on file     Family History:  The  patient's family history includes Cancer in her father and mother; Stroke in her father and mother.   ROS:   Please see the history of present illness.    ROS All other systems reviewed and are negative.   PHYSICAL EXAM:   VS:  BP 136/66   Pulse 85   Ht 5\' 2"  (1.575 m)   Wt 160 lb 3.2 oz (72.7 kg)   SpO2 96%   BMI 29.30 kg/m    GEN: Well nourished, well developed, in no acute distress  HEENT: normal  Neck: no JVD, carotid bruits, or masses Cardiac: RRR; no murmurs, rubs, or gallops,no edema  Respiratory:  clear to auscultation bilaterally, normal work of breathing GI: soft, nontender, nondistended, + BS MS: no deformity or atrophy  Skin: warm and dry, no rash Neuro:  Alert and Oriented x 3, Strength and sensation are intact Psych: euthymic mood, full affect  Wt Readings from Last 3 Encounters:  06/13/20 160 lb 3.2 oz (72.7 kg)  05/02/20 150 lb (68 kg)  02/21/20 167 lb 9.6 oz (76 kg)      Studies/Labs Reviewed:   EKG:  EKG is  ordered today.  NSR with PACs. Rate 85. Low voltage. I have personally reviewed and interpreted this study.   Recent Labs: 11/20/2019: TSH 0.74 06/11/2020: ALT 21; BUN 14; Creatinine, Ser 1.07; Potassium 5.6; Sodium 143   Lipid Panel    Component Value Date/Time   CHOL 135 06/11/2020 1205   TRIG 121 06/11/2020 1205   HDL 47 06/11/2020 1205   CHOLHDL 2.9 06/11/2020 1205   CHOLHDL 2.6 12/10/2016 0954   VLDL 26 12/10/2016 0954   LDLCALC 66 06/11/2020 1205   Ecg today shows NSR with rate 89. Low voltage. Old septal infarct.  I have personally reviewed and interpreted this study.   Additional studies/ records that were reviewed today include:   Myoview 09/22/18: Study Highlights    The left ventricular ejection fraction is normal (55-65%).  Nuclear stress EF: 63%.  There was no ST segment deviation noted during stress.  Defect 1: There is a medium size severe perfusion defect present in the apical anterior, apical inferior, apical  lateral and apex location.  Defect 2: There is a small defect of severe severity present in the mid anterior location.  Findings consistent with prior myocardial infarction.  This is an intermediate risk study.   Medium size severe perfusion defect in the inferoapex, lateral apex and anteroapex, fixed without evidence of ischemia.  Small fixed perfusion defect in the anterior wall at the mid ventricle.  Evidence of infarction without evidence of ischemia.     Cath 10/11/2018  Prox LAD to Mid LAD lesion is 100% stenosed.  Previously placed Mid RCA stent (unknown type) is widely patent.  Post intervention, there is a 0% residual stenosis.  A drug-eluting stent was successfully placed using a STENT SYNERGY DES [id number].  LV end diastolic pressure is normal.   1. Single vessel occlusive CAD with CTO of the mid LAD 2. Continued patency of stents in the RCA 3. Normal LVEDP 4. Successful CTO PCI of the mid LAD with DES x 1  Plan: DAPT for one year. Patient is a candidate for same day discharge.     ASSESSMENT:    1. Coronary artery disease involving native coronary artery of native heart without angina pectoris   2. Type 2 diabetes mellitus without complication, with long-term current  use of insulin ([place])   3. Essential hypertension   4. Dyslipidemia      PLAN:  In order of problems listed above:  1. CAD: S/p remote BMS of RCA. s/p DES of mid LAD in March 2020.  Continue ASA 81 mg daily. On statin and beta blocker.   She is asymptomatic.   2. Hypertension: BP is well controlled.   3. Hyperlipidemia: Continue statin therapy, Crestor 20 mg daily. LDL 66. At goal  4. DM2: Managed by Dr Loanne Drilling  5. Hypothyroidism: On Synthroid  6.   Hyperkalemia. Recommend she stop potassium supplement.    Follow up in 6 months.  Medication Adjustments/Labs and Tests Ordered: Current medicines are reviewed at length with the patient today.  Concerns regarding medicines are outlined  above.  Medication changes, Labs and Tests ordered today are listed in the Patient Instructions below. There are no Patient Instructions on file for this visit.   Signed, Kalyn Hofstra Martinique, MD  06/13/2020 1:39 PM    [facility] [address], [place], [place]  [address] Phone: [phone]; Fax: [phone]

## 2020-06-11 ENCOUNTER — Encounter: Payer: Self-pay | Admitting: Cardiology

## 2020-06-11 DIAGNOSIS — E785 Hyperlipidemia, unspecified: Secondary | ICD-10-CM | POA: Diagnosis not present

## 2020-06-11 DIAGNOSIS — I2510 Atherosclerotic heart disease of native coronary artery without angina pectoris: Secondary | ICD-10-CM | POA: Diagnosis not present

## 2020-06-11 DIAGNOSIS — Z794 Long term (current) use of insulin: Secondary | ICD-10-CM | POA: Diagnosis not present

## 2020-06-11 DIAGNOSIS — R87610 Atypical squamous cells of undetermined significance on cytologic smear of cervix (ASC-US): Secondary | ICD-10-CM | POA: Diagnosis not present

## 2020-06-11 DIAGNOSIS — I10 Essential (primary) hypertension: Secondary | ICD-10-CM | POA: Diagnosis not present

## 2020-06-11 DIAGNOSIS — E119 Type 2 diabetes mellitus without complications: Secondary | ICD-10-CM | POA: Diagnosis not present

## 2020-06-11 DIAGNOSIS — N858 Other specified noninflammatory disorders of uterus: Secondary | ICD-10-CM | POA: Diagnosis not present

## 2020-06-11 NOTE — Telephone Encounter (Signed)
error 

## 2020-06-12 ENCOUNTER — Other Ambulatory Visit: Payer: Self-pay | Admitting: Cardiology

## 2020-06-12 LAB — BASIC METABOLIC PANEL
BUN/Creatinine Ratio: 13 (ref 12–28)
BUN: 14 mg/dL (ref 8–27)
CO2: 23 mmol/L (ref 20–29)
Calcium: 10.1 mg/dL (ref 8.7–10.3)
Chloride: 103 mmol/L (ref 96–106)
Creatinine, Ser: 1.07 mg/dL — ABNORMAL HIGH (ref 0.57–1.00)
GFR calc Af Amer: 55 mL/min/{1.73_m2} — ABNORMAL LOW (ref >59)
GFR calc non Af Amer: 47 mL/min/{1.73_m2} — ABNORMAL LOW (ref >59)
Glucose: 136 mg/dL — ABNORMAL HIGH (ref 65–99)
Potassium: 5.6 mmol/L — ABNORMAL HIGH (ref 3.5–5.2)
Sodium: 143 mmol/L (ref 134–144)

## 2020-06-12 LAB — HEPATIC FUNCTION PANEL
ALT: 21 IU/L (ref 0–32)
AST: 22 IU/L (ref 0–40)
Albumin: 4.8 g/dL — ABNORMAL HIGH (ref 3.6–4.6)
Alkaline Phosphatase: 45 IU/L (ref 44–121)
Bilirubin Total: 0.4 mg/dL (ref 0.0–1.2)
Bilirubin, Direct: 0.12 mg/dL (ref 0.00–0.40)
Total Protein: 8 g/dL (ref 6.0–8.5)

## 2020-06-12 LAB — LIPID PANEL
Chol/HDL Ratio: 2.9 ratio (ref 0.0–4.4)
Cholesterol, Total: 135 mg/dL (ref 100–199)
HDL: 47 mg/dL (ref >39)
LDL Chol Calc (NIH): 66 mg/dL (ref 0–99)
Triglycerides: 121 mg/dL (ref 0–149)
VLDL Cholesterol Cal: 22 mg/dL (ref 5–40)

## 2020-06-13 ENCOUNTER — Other Ambulatory Visit: Payer: Self-pay

## 2020-06-13 ENCOUNTER — Encounter: Payer: Self-pay | Admitting: Cardiology

## 2020-06-13 ENCOUNTER — Ambulatory Visit (INDEPENDENT_AMBULATORY_CARE_PROVIDER_SITE_OTHER): Payer: Medicare Other | Admitting: Cardiology

## 2020-06-13 VITALS — BP 136/66 | HR 85 | Ht 62.0 in | Wt 160.2 lb

## 2020-06-13 DIAGNOSIS — E785 Hyperlipidemia, unspecified: Secondary | ICD-10-CM | POA: Diagnosis not present

## 2020-06-13 DIAGNOSIS — I2510 Atherosclerotic heart disease of native coronary artery without angina pectoris: Secondary | ICD-10-CM

## 2020-06-13 DIAGNOSIS — Z794 Long term (current) use of insulin: Secondary | ICD-10-CM

## 2020-06-13 DIAGNOSIS — E119 Type 2 diabetes mellitus without complications: Secondary | ICD-10-CM

## 2020-06-13 DIAGNOSIS — I10 Essential (primary) hypertension: Secondary | ICD-10-CM | POA: Diagnosis not present

## 2020-06-19 DIAGNOSIS — R87610 Atypical squamous cells of undetermined significance on cytologic smear of cervix (ASC-US): Secondary | ICD-10-CM | POA: Diagnosis not present

## 2020-07-02 ENCOUNTER — Ambulatory Visit (INDEPENDENT_AMBULATORY_CARE_PROVIDER_SITE_OTHER): Payer: Medicare Other | Admitting: Endocrinology

## 2020-07-02 ENCOUNTER — Encounter: Payer: Self-pay | Admitting: Endocrinology

## 2020-07-02 ENCOUNTER — Other Ambulatory Visit: Payer: Self-pay

## 2020-07-02 VITALS — BP 138/82 | HR 90 | Ht 62.0 in | Wt 161.2 lb

## 2020-07-02 DIAGNOSIS — Z23 Encounter for immunization: Secondary | ICD-10-CM | POA: Diagnosis not present

## 2020-07-02 DIAGNOSIS — N189 Chronic kidney disease, unspecified: Secondary | ICD-10-CM

## 2020-07-02 DIAGNOSIS — E1122 Type 2 diabetes mellitus with diabetic chronic kidney disease: Secondary | ICD-10-CM

## 2020-07-02 DIAGNOSIS — I2510 Atherosclerotic heart disease of native coronary artery without angina pectoris: Secondary | ICD-10-CM

## 2020-07-02 DIAGNOSIS — Z794 Long term (current) use of insulin: Secondary | ICD-10-CM | POA: Diagnosis not present

## 2020-07-02 DIAGNOSIS — E119 Type 2 diabetes mellitus without complications: Secondary | ICD-10-CM

## 2020-07-02 LAB — POCT GLYCOSYLATED HEMOGLOBIN (HGB A1C): Hemoglobin A1C: 7.5 % — AB (ref 4.0–5.6)

## 2020-07-02 NOTE — Progress Notes (Signed)
Subjective:    Patient ID: Alicia Medina, female    DOB: 1933-07-18, 86 y.o.   MRN: [id number]  HPI Pt returns for f/u of diabetes mellitus:  DM type: Insulin-requiring type 2 Dx'ed: 0355 Complications: CAD, PN, and stage 3 CRI.  Therapy: insulin since 9741, and Trulicity. GDM: never.  DKA: never.   Severe hypoglycemia: never.   Pancreatitis: never.  Pancreatic imaging: never Other: she declines multiple daily injections; She did not tolerate metformin (diarrhea); she changed lantus to levemir, then NPH, based on the pattern of cbg's.   Interval history: she brings a record of her cbg's which I have reviewed today.  cbg varies from 121-215.  There is no trend throughout the day. Pt says she seldom misses the insulin.  pt states she feels well in general.   She had thyroidect in 2008 (benign pathol; she has been on synthroid since then; she requests brand name synthroid).    Past Medical History:  Diagnosis Date  . Arthritis   . CAD (coronary artery disease) CARDIOLOGIST-  DR Martinique   08-28-2011 Inferior STEMI with BMS to the RCA, complicated by cardiogenic shock, VF, VDRF , temporary CHB and abrupt reocclusion of the RCA stent treated with a second BMS to the RCA. Has residual LAD disease. EF is 50%  . Diverticulosis of colon   . Hemorrhoids, internal, with bleeding   . Hyperlipidemia   . Hypertension   . Hypothyroidism, postsurgical   . S/p bare metal coronary artery stent    2013  . Type 2 diabetes mellitus ([place])   . Wears hearing aid    both ears    Past Surgical History:  Procedure Laterality Date  . CARDIOVASCULAR STRESS TEST  10-03-2011  dr Martinique   normal perfusion / no ischemia/ ef 77%  . CARPAL TUNNEL RELEASE Right 01/25/2013   Procedure: CARPAL TUNNEL RELEASE;  Surgeon: Wynonia Sours, MD;  Location: [place];  Service: Orthopedics;  Laterality: Right;  . CARPAL TUNNEL RELEASE Left 10/25/2013   Procedure: LEFT CARPAL TUNNEL RELEASE;  Surgeon: Wynonia Sours, MD;  Location: [place];  Service: Orthopedics;  Laterality: Left;  . CATARACT EXTRACTION W/ INTRAOCULAR LENS  IMPLANT, BILATERAL  2007  . COLONOSCOPY  04-27-2003  . CORONARY ANGIOGRAM Left 08/28/2011   Procedure: CORONARY ANGIOGRAM;  Surgeon: Peter M Martinique, MD;  Location[facility] CATH LAB;  Service: Cardiovascular;  Laterality: Left;  . CORONARY ANGIOPLASTY WITH STENT PLACEMENT  08-28-2011  dr Martinique   BMS to [facility] with abrupt reocclusion treated with second BMS/  LAD ostial 90%, proximal50-60%, first diagonal 80-90%  . CORONARY STENT INTERVENTION N/A 10/11/2018   Procedure: CORONARY STENT INTERVENTION;  Surgeon: Martinique, Peter M, MD;  Location: [place] CV LAB;  Service: Cardiovascular;  Laterality: N/A;  . LEFT HEART CATH AND CORONARY ANGIOGRAPHY N/A 10/11/2018   Procedure: LEFT HEART CATH AND CORONARY ANGIOGRAPHY;  Surgeon: Martinique, Peter M, MD;  Location: [place] CV LAB;  Service: Cardiovascular;  Laterality: N/A;  . LEFT HEART CATHETERIZATION WITH CORONARY ANGIOGRAM N/A 08/28/2011   Procedure: LEFT HEART CATHETERIZATION WITH CORONARY ANGIOGRAM;  Surgeon: Peter M Martinique, MD;  Location: [facility] CATH LAB;  Service: Cardiovascular;  Laterality: N/A;  . PERCUTANEOUS CORONARY STENT INTERVENTION (PCI-S) Left 08/28/2011   Procedure: PERCUTANEOUS CORONARY STENT INTERVENTION (PCI-S);  Surgeon: Peter M Martinique, MD;  Location: [facility] CATH LAB;  Service: Cardiovascular;  Laterality: Left;  . TOTAL THYROIDECTOMY Bilateral 05-02-2007   multinodular goiter  .  TRANSANAL HEMORRHOIDAL DEARTERIALIZATION N/A 03/28/2014   Procedure: TRANSANAL HEMORRHOIDAL DEARTERIALIZATION;  Surgeon: Leighton Ruff, MD;  Location: [facility];  Service: General;  Laterality: N/A;  . TRANSTHORACIC ECHOCARDIOGRAM  08-30-2011   mild LVH/ ef 92-92%/  grade I diastolic dysfunction  . TRIGGER FINGER RELEASE Right 01/25/2013   Procedure: RELEASE TRIGGER FINGER/A-1 PULLEY RIGHT INDEX FINGER;  Surgeon: Wynonia Sours, MD;   Location: [place];  Service: Orthopedics;  Laterality: Right;  . TRIGGER FINGER RELEASE Left 10/25/2013   Procedure: RELEASE A-1 PULLEY LEFT MIDDLE FINGER;  Surgeon: Wynonia Sours, MD;  Location: [place];  Service: Orthopedics;  Laterality: Left;  . TRIGGER FINGER RELEASE Left 12/04/2014   Procedure: RELEASE TRIGGER FINGER/A-1 PULLEY LEFT RING FINGER;  Surgeon: Daryll Brod, MD;  Location: [place];  Service: Orthopedics;  Laterality: Left;  . TRIGGER FINGER RELEASE Right 03/30/2017   Procedure: RELEASE TRIGGER FINGER/A-1 PULLEY RIGHT RING FINGER AND RIGHT SMALL FINGER;  Surgeon: Daryll Brod, MD;  Location: [place];  Service: Orthopedics;  Laterality: Right;    Social History   Socioeconomic History  . Marital status: Married    Spouse name: Not on file  . Number of children: Not on file  . Years of education: Not on file  . Highest education level: Not on file  Occupational History  . Not on file  Tobacco Use  . Smoking status: Never Smoker  . Smokeless tobacco: Never Used  Vaping Use  . Vaping Use: Never used  Substance and Sexual Activity  . Alcohol use: Yes    Alcohol/week: 1.0 standard drink    Types: 1 Glasses of wine per week    Comment: social  . Drug use: No  . Sexual activity: Not on file  Other Topics Concern  . Not on file  Social History Narrative   Lives with husband.   Has 2 sons- both healthy.   Younger son lives in [place], [place] in order son lives in [place].   Social Determinants of Health   Financial Resource Strain: Not on file  Food Insecurity: Not on file  Transportation Needs: Not on file  Physical Activity: Not on file  Stress: Not on file  Social Connections: Not on file  Intimate Partner Violence: Not on file    Current Outpatient Medications on File Prior to Visit  Medication Sig Dispense Refill  . acetaminophen (TYLENOL) 500 MG tablet Take 1,000 mg by mouth at bedtime.    .  Ascorbic Acid (VITAMIN C) 1000 MG tablet Take 1,000 mg by mouth every evening.    . Aspirin Buf,CaCarb-MgCarb-MgO, 81 MG TABS aspirin 81 mg tablet  Take by oral route.    Marland Kitchen aspirin EC 81 MG tablet Take 81 mg by mouth daily.    . Biotin w/ Vitamins C & E (HAIR/SKIN/NAILS PO) Take 1 tablet by mouth 2 (two) times daily.    Marland Kitchen bismuth subsalicylate (PEPTO BISMOL) 262 MG chewable tablet Chew 131-262 mg by mouth as directed.     . calcium carbonate (CALCIUM 600) 1500 (600 Ca) MG TABS tablet Take 600 mg of elemental calcium by mouth once a week.     . Cholecalciferol (VITAMIN D-3) 125 MCG (5000 UT) TABS Take 5,000 Units by mouth daily.    . Chromium Picolinate 1000 MCG TABS Take 1,000 mcg by mouth daily.    . Coenzyme Q10 200 MG TABS Take 200 mg by mouth daily.     Marland Kitchen  COLLAGEN PO Take 1,000 mg by mouth 2 (two) times daily.    . Dulaglutide (TRULICITY) 1.5 VO/1.6WV SOPN Inject 1.5 mg into the skin once a week. 6 mL 3  . ESTRACE VAGINAL 0.1 MG/GM vaginal cream     . folic acid (FOLVITE) 371 MCG tablet Take 800 mcg by mouth daily.    Marland Kitchen glucose blood (TRUE METRIX BLOOD GLUCOSE TEST) test strip 1 each by Other route 2 (two) times daily. And lancets 2/day 200 each 3  . Insulin NPH, Human,, Isophane, (NOVOLIN N FLEXPEN) 100 UNIT/ML Kiwkpen Inject 15 Units into the skin every morning. 15 mL 2  . loperamide (IMODIUM A-D) 2 MG tablet Take 1-2 mg by mouth as directed.     . loratadine (CLARITIN) 10 MG tablet Take 10 mg by mouth at bedtime.    Marland Kitchen losartan (COZAAR) 25 MG tablet TAKE 1 TABLET (25 MG TOTAL) BY MOUTH EVERY MORNING. 90 tablet 3  . Magnesium 250 MG TABS Take 250 mg by mouth daily.    . metoprolol tartrate (LOPRESSOR) 50 MG tablet Take 1 tablet (50 mg total) by mouth 2 (two) times daily. 180 tablet 3  . Multiple Vitamin (MULTIVITAMIN) tablet Take 1 tablet by mouth once a week.     . nitrofurantoin, macrocrystal-monohydrate, (MACROBID) 100 MG capsule Take 100 mg by mouth 2 (two) times daily.    . Psyllium  (METAMUCIL PO) Take 1 capsule by mouth daily.    . rosuvastatin (CRESTOR) 20 MG tablet Take 1 tablet (20 mg total) by mouth daily. 90 tablet 3  . sulfamethoxazole-trimethoprim (BACTRIM DS) 800-160 MG tablet Take 1 tablet by mouth 2 (two) times daily.    Marland Kitchen SYNTHROID 175 MCG tablet TAKE 1 TABLET (175 MCG TOTAL) BY MOUTH DAILY BEFORE BREAKFAST. 90 tablet 1  . vitamin B-12 (CYANOCOBALAMIN) 500 MCG tablet Take 500 mcg by mouth daily.    Marland Kitchen VITAMIN E PO Take 500 mg by mouth 2 (two) times a week.      No current facility-administered medications on file prior to visit.    Allergies  Allergen Reactions  . Demeclocycline Other (See Comments)    Severe stomach cramps Severe stomach cramps Severe stomach cramps  . Lisinopril Cough  . Tetracyclines & Related Other (See Comments)    Severe stomach cramps    Family History  Problem Relation Age of Onset  . Stroke Mother   . Cancer Mother        breast  . Cancer Father        lung  . Stroke Father   . Diabetes Neg Hx     BP 138/82   Pulse 90   Ht 5\' 2"  (1.575 m)   Wt 161 lb 3.2 oz (73.1 kg)   SpO2 98%   BMI 29.48 kg/m    Review of Systems She denies hypoglycemia/n/v    Objective:   Physical Exam VITAL SIGNS:  See vs page GENERAL: no distress Pulses: dorsalis pedis intact bilat.   MSK: no deformity of the feet CV: no leg edema Skin:  no ulcer on the feet.  normal color and temp on the feet. Neuro: sensation is intact to touch on the feet, but decreased from normal  Lab Results  Component Value Date   TSH 0.74 11/20/2019    A1c=7.5%    Assessment & Plan:  Insulin-requiring type 2 DM, with CRI: uncontrolled.  I offered to increase Trulicity, and d/c insulin.  She declines  Patient Instructions  Please continue the  same insulin and trulicity On this type of insulin schedule, you should eat meals on a regular schedule.  If a meal is missed or significantly delayed, your blood sugar could go low.  check your blood sugar  twice a day.  vary the time of day when you check, between before the 3 meals, and at bedtime.  also check if you have symptoms of your blood sugar being too high or too low.  please keep a record of the readings and bring it to your next appointment here (or you can bring the meter itself).  You can write it on any piece of paper.  please call us sooner if your blood sugar goes below 70, or if you have a lot of readings over 200.   Please come back for a follow-up appointment in 3 months.

## 2020-07-02 NOTE — Patient Instructions (Addendum)
Please continue the same insulin and trulicity On this type of insulin schedule, you should eat meals on a regular schedule.  If a meal is missed or significantly delayed, your blood sugar could go low.  check your blood sugar twice a day.  vary the time of day when you check, between before the 3 meals, and at bedtime.  also check if you have symptoms of your blood sugar being too high or too low.  please keep a record of the readings and bring it to your next appointment here (or you can bring the meter itself).  You can write it on any piece of paper.  please call us sooner if your blood sugar goes below 70, or if you have a lot of readings over 200.   Please come back for a follow-up appointment in 3 months.

## 2020-07-29 DIAGNOSIS — R52 Pain, unspecified: Secondary | ICD-10-CM | POA: Diagnosis not present

## 2020-07-30 ENCOUNTER — Other Ambulatory Visit: Payer: Self-pay | Admitting: Orthopedic Surgery

## 2020-08-01 ENCOUNTER — Telehealth: Payer: Self-pay

## 2020-08-01 ENCOUNTER — Other Ambulatory Visit: Payer: Self-pay

## 2020-08-01 ENCOUNTER — Encounter (HOSPITAL_BASED_OUTPATIENT_CLINIC_OR_DEPARTMENT_OTHER): Payer: Self-pay | Admitting: Orthopedic Surgery

## 2020-08-01 NOTE — Telephone Encounter (Signed)
There is no reason to hold ASA for this type of procedure. Given her history I would continue ASA thru surgery  Eliaz Fout [place] MD, [facility]

## 2020-08-01 NOTE — Telephone Encounter (Signed)
Dr. Swaziland  This patient is to undergo an excision mass and debridement of a right finger joint with the surgical team asking to hold her ASA. Given her hx of CAD with prior PCI/DES 09/2018 and BMS to RCA in 2013, they are seeking ASA holding recommendations prior to her procedure.   Please forward your recommendations to CV DIV Pre-op pool  Thank you Noreene Larsson

## 2020-08-01 NOTE — Telephone Encounter (Signed)
   Primary Cardiologist: Peter Swaziland, MD  Chart reviewed as part of pre-operative protocol coverage. Patient was contacted 08/01/2020 in reference to pre-operative risk assessment for pending surgery as outlined below.  Alicia Medina was last seen on 06/13/20 by Dr. Swaziland and was doing very well from a CV standpoint at that time.     Therefore, based on ACC/AHA guidelines, the patient would be at acceptable risk for the planned procedure without further cardiovascular testing.   For this type of procedure, there would be no reason to hold ASA therefor the patient should continue ASA throughout the intraoperative period per Dr. Elvis Coil request.   The patient was advised that if she develops new symptoms prior to surgery to contact our office to arrange for a follow-up visit, and she verbalized understanding.  I will route this recommendation to the requesting party via Epic fax function and remove from pre-op pool. Please call with questions.  Georgie Chard, NP 08/01/2020, 1:35 PM

## 2020-08-01 NOTE — Telephone Encounter (Signed)
   [facility] Pre-operative Risk Assessment    Request for surgical clearance:  1. What type of surgery is being performed? EXCISION MASS AND DEBRIDEMENT PROXIMAL INTERPHALANGEAL JOINT RIGHT SMALL FINGER   2. When is this surgery scheduled? 08-06-2020   3. What type of clearance is required (medical clearance vs. Pharmacy clearance to hold med vs. Both)? BOTH  4. Are there any medications that need to be held prior to surgery and how long? ASA   5. Practice name and name of physician performing surgery? [place]  ATTN:BRENDA   6. What is the office phone number? [phone]   7.   What is the office fax number? [phone]  8.   Anesthesia type (None, local, MAC, general) ? Regional

## 2020-08-01 NOTE — Telephone Encounter (Signed)
Follow up:   I have Steward Drone from Dr Merlyn Lot office needing [facility] need the clearance ASAP concerning Asprin. They need this to be sent [facility]

## 2020-08-02 ENCOUNTER — Other Ambulatory Visit (HOSPITAL_COMMUNITY)
Admission: RE | Admit: 2020-08-02 | Discharge: 2020-08-02 | Disposition: A | Discharge status: Home / Self Care | Payer: Medicare Other | Source: Ambulatory Visit | Attending: Orthopedic Surgery | Admitting: Orthopedic Surgery

## 2020-08-02 ENCOUNTER — Encounter (HOSPITAL_BASED_OUTPATIENT_CLINIC_OR_DEPARTMENT_OTHER)
Admission: RE | Admit: 2020-08-02 | Discharge: 2020-08-02 | Disposition: A | Discharge status: Home / Self Care | Payer: Medicare Other | Source: Ambulatory Visit | Attending: Orthopedic Surgery | Admitting: Orthopedic Surgery

## 2020-08-02 DIAGNOSIS — Z01812 Encounter for preprocedural laboratory examination: Secondary | ICD-10-CM | POA: Diagnosis not present

## 2020-08-02 DIAGNOSIS — Z20822 Contact with and (suspected) exposure to covid-19: Secondary | ICD-10-CM | POA: Insufficient documentation

## 2020-08-02 LAB — BASIC METABOLIC PANEL
Anion gap: 9 (ref 5–15)
BUN: 12 mg/dL (ref 8–23)
CO2: 26 mmol/L (ref 22–32)
Calcium: 8.9 mg/dL (ref 8.9–10.3)
Chloride: 104 mmol/L (ref 98–111)
Creatinine, Ser: 0.94 mg/dL (ref 0.44–1.00)
GFR, Estimated: 59 mL/min — ABNORMAL LOW (ref >60)
Glucose, Bld: 248 mg/dL — ABNORMAL HIGH (ref 70–99)
Potassium: 4.7 mmol/L (ref 3.5–5.1)
Sodium: 139 mmol/L (ref 135–145)

## 2020-08-02 NOTE — Progress Notes (Signed)

## 2020-08-03 LAB — SARS CORONAVIRUS 2 (TAT 6-24 HRS): SARS Coronavirus 2: NEGATIVE

## 2020-08-06 ENCOUNTER — Ambulatory Visit (HOSPITAL_BASED_OUTPATIENT_CLINIC_OR_DEPARTMENT_OTHER): Payer: Medicare Other | Admitting: Anesthesiology

## 2020-08-06 ENCOUNTER — Ambulatory Visit (HOSPITAL_BASED_OUTPATIENT_CLINIC_OR_DEPARTMENT_OTHER)
Admission: RE | Admit: 2020-08-06 | Discharge: 2020-08-06 | Disposition: A | Discharge status: Home / Self Care | Payer: Medicare Other | Attending: Orthopedic Surgery | Admitting: Orthopedic Surgery

## 2020-08-06 ENCOUNTER — Encounter (HOSPITAL_BASED_OUTPATIENT_CLINIC_OR_DEPARTMENT_OTHER): Payer: Self-pay | Admitting: Orthopedic Surgery

## 2020-08-06 ENCOUNTER — Other Ambulatory Visit: Payer: Self-pay

## 2020-08-06 ENCOUNTER — Encounter (HOSPITAL_BASED_OUTPATIENT_CLINIC_OR_DEPARTMENT_OTHER)
Admission: RE | Disposition: A | Discharge status: Home / Self Care | Payer: Self-pay | Source: Home / Self Care | Attending: Orthopedic Surgery

## 2020-08-06 DIAGNOSIS — L728 Other follicular cysts of the skin and subcutaneous tissue: Secondary | ICD-10-CM | POA: Diagnosis present

## 2020-08-06 DIAGNOSIS — M25841 Other specified joint disorders, right hand: Secondary | ICD-10-CM | POA: Insufficient documentation

## 2020-08-06 DIAGNOSIS — Z803 Family history of malignant neoplasm of breast: Secondary | ICD-10-CM | POA: Diagnosis not present

## 2020-08-06 DIAGNOSIS — I252 Old myocardial infarction: Secondary | ICD-10-CM | POA: Diagnosis not present

## 2020-08-06 DIAGNOSIS — M1990 Unspecified osteoarthritis, unspecified site: Secondary | ICD-10-CM | POA: Insufficient documentation

## 2020-08-06 DIAGNOSIS — I2510 Atherosclerotic heart disease of native coronary artery without angina pectoris: Secondary | ICD-10-CM | POA: Diagnosis not present

## 2020-08-06 DIAGNOSIS — Z801 Family history of malignant neoplasm of trachea, bronchus and lung: Secondary | ICD-10-CM | POA: Diagnosis not present

## 2020-08-06 DIAGNOSIS — E119 Type 2 diabetes mellitus without complications: Secondary | ICD-10-CM | POA: Insufficient documentation

## 2020-08-06 DIAGNOSIS — Z888 Allergy status to other drugs, medicaments and biological substances status: Secondary | ICD-10-CM | POA: Diagnosis not present

## 2020-08-06 DIAGNOSIS — Z823 Family history of stroke: Secondary | ICD-10-CM | POA: Diagnosis not present

## 2020-08-06 DIAGNOSIS — M25741 Osteophyte, right hand: Secondary | ICD-10-CM | POA: Diagnosis not present

## 2020-08-06 DIAGNOSIS — Z794 Long term (current) use of insulin: Secondary | ICD-10-CM | POA: Diagnosis not present

## 2020-08-06 DIAGNOSIS — Z955 Presence of coronary angioplasty implant and graft: Secondary | ICD-10-CM | POA: Diagnosis not present

## 2020-08-06 DIAGNOSIS — I509 Heart failure, unspecified: Secondary | ICD-10-CM | POA: Diagnosis not present

## 2020-08-06 DIAGNOSIS — E1122 Type 2 diabetes mellitus with diabetic chronic kidney disease: Secondary | ICD-10-CM | POA: Diagnosis not present

## 2020-08-06 DIAGNOSIS — M85641 Other cyst of bone, right hand: Secondary | ICD-10-CM | POA: Diagnosis not present

## 2020-08-06 DIAGNOSIS — I130 Hypertensive heart and chronic kidney disease with heart failure and stage 1 through stage 4 chronic kidney disease, or unspecified chronic kidney disease: Secondary | ICD-10-CM | POA: Diagnosis not present

## 2020-08-06 DIAGNOSIS — N183 Chronic kidney disease, stage 3 unspecified: Secondary | ICD-10-CM | POA: Diagnosis not present

## 2020-08-06 HISTORY — PX: MASS EXCISION: SHX2000

## 2020-08-06 LAB — GLUCOSE, CAPILLARY
Glucose-Capillary: 124 mg/dL — ABNORMAL HIGH (ref 70–99)
Glucose-Capillary: 108 mg/dL — ABNORMAL HIGH (ref 70–99)

## 2020-08-06 SURGERY — EXCISION MASS
Anesthesia: Regional | Site: Finger | Laterality: Right

## 2020-08-06 MED ORDER — PROPOFOL 10 MG/ML IV BOLUS
INTRAVENOUS | Status: DC | PRN
  Administered 2020-08-06 (×2): 20 mg via INTRAVENOUS

## 2020-08-06 MED ORDER — CEFAZOLIN SODIUM-DEXTROSE 2-4 GM/100ML-% IV SOLN
INTRAVENOUS | Status: AC
  Filled 2020-08-06: qty 100

## 2020-08-06 MED ORDER — LIDOCAINE HCL (PF) 0.5 % IJ SOLN
INTRAMUSCULAR | Status: DC | PRN
  Administered 2020-08-06: 25 mL via INTRAVENOUS

## 2020-08-06 MED ORDER — TRAMADOL HCL 50 MG PO TABS: 50.0000 mg | ORAL_TABLET | Freq: Four times a day (QID) | ORAL | 0 refills | Status: DC | PRN

## 2020-08-06 MED ORDER — LACTATED RINGERS IV SOLN: INTRAVENOUS | Status: DC

## 2020-08-06 MED ORDER — BUPIVACAINE HCL (PF) 0.25 % IJ SOLN
INTRAMUSCULAR | Status: DC | PRN
  Administered 2020-08-06: 7 mL

## 2020-08-06 MED ORDER — CEFAZOLIN SODIUM-DEXTROSE 2-4 GM/100ML-% IV SOLN
2.0000 g | INTRAVENOUS | Status: AC
  Administered 2020-08-06: 2 g via INTRAVENOUS

## 2020-08-06 MED ORDER — HYDROMORPHONE HCL 1 MG/ML IJ SOLN: 0.2500 mg | INTRAMUSCULAR | Status: DC | PRN

## 2020-08-06 MED ORDER — PROPOFOL 500 MG/50ML IV EMUL
INTRAVENOUS | Status: DC | PRN
  Administered 2020-08-06: 50 ug/kg/min via INTRAVENOUS

## 2020-08-06 MED ORDER — ONDANSETRON HCL 4 MG/2ML IJ SOLN: 4.0000 mg | Freq: Once | INTRAMUSCULAR | Status: DC | PRN

## 2020-08-06 MED ORDER — FENTANYL CITRATE (PF) 100 MCG/2ML IJ SOLN
INTRAMUSCULAR | Status: DC | PRN
  Administered 2020-08-06: 50 ug via INTRAVENOUS

## 2020-08-06 MED ORDER — FENTANYL CITRATE (PF) 100 MCG/2ML IJ SOLN
INTRAMUSCULAR | Status: AC
  Filled 2020-08-06: qty 2

## 2020-08-06 MED ORDER — ONDANSETRON HCL 4 MG/2ML IJ SOLN
INTRAMUSCULAR | Status: DC | PRN
  Administered 2020-08-06: 4 mg via INTRAVENOUS

## 2020-08-06 MED ORDER — ACETAMINOPHEN 10 MG/ML IV SOLN: 1000.0000 mg | Freq: Once | INTRAVENOUS | Status: DC | PRN

## 2020-08-06 MED ORDER — ONDANSETRON HCL 4 MG/2ML IJ SOLN
INTRAMUSCULAR | Status: AC
  Filled 2020-08-06: qty 2

## 2020-08-06 SURGICAL SUPPLY — 51 items
APL PRP STRL LF DISP 70% ISPRP (MISCELLANEOUS) ×1
BLADE SURG 15 STRL LF DISP TIS (BLADE) ×1 IMPLANT
BLADE SURG 15 STRL SS (BLADE) ×2
BNDG CMPR 9X4 STRL LF SNTH (GAUZE/BANDAGES/DRESSINGS) ×1
BNDG COHESIVE 1X5 TAN STRL LF (GAUZE/BANDAGES/DRESSINGS) ×1 IMPLANT
BNDG COHESIVE 2X5 TAN STRL LF (GAUZE/BANDAGES/DRESSINGS) IMPLANT
BNDG COHESIVE 3X5 TAN STRL LF (GAUZE/BANDAGES/DRESSINGS) IMPLANT
BNDG ESMARK 4X9 LF (GAUZE/BANDAGES/DRESSINGS) ×1 IMPLANT
BNDG GAUZE ELAST 4 BULKY (GAUZE/BANDAGES/DRESSINGS) IMPLANT
CHLORAPREP W/TINT 26 (MISCELLANEOUS) ×2 IMPLANT
CORD BIPOLAR FORCEPS 12FT (ELECTRODE) ×2 IMPLANT
COVER BACK TABLE 60X90IN (DRAPES) ×2 IMPLANT
COVER MAYO STAND STRL (DRAPES) ×2 IMPLANT
COVER WAND RF STERILE (DRAPES) IMPLANT
CUFF TOURN SGL QUICK 18X4 (TOURNIQUET CUFF) ×1 IMPLANT
DECANTER SPIKE VIAL GLASS SM (MISCELLANEOUS) IMPLANT
DRAIN PENROSE 1/2X12 LTX STRL (WOUND CARE) IMPLANT
DRAPE EXTREMITY T 121X128X90 (DISPOSABLE) ×2 IMPLANT
DRAPE SURG 17X23 STRL (DRAPES) ×2 IMPLANT
GAUZE SPONGE 4X4 12PLY STRL (GAUZE/BANDAGES/DRESSINGS) ×2 IMPLANT
GAUZE XEROFORM 1X8 LF (GAUZE/BANDAGES/DRESSINGS) ×2 IMPLANT
GLOVE BIOGEL PI IND STRL 8.5 (GLOVE) ×1 IMPLANT
GLOVE BIOGEL PI INDICATOR 8.5 (GLOVE) ×1
GLOVE SURG ORTHO 8.0 STRL STRW (GLOVE) ×2 IMPLANT
GLOVE SURG SYN 7.5  E (GLOVE) ×2
GLOVE SURG SYN 7.5 E (GLOVE) ×1 IMPLANT
GLOVE SURG SYN 7.5 PF PI (GLOVE) IMPLANT
GLOVE SURG UNDER POLY LF SZ7 (GLOVE) ×1 IMPLANT
GOWN STRL REUS W/ TWL LRG LVL3 (GOWN DISPOSABLE) ×1 IMPLANT
GOWN STRL REUS W/ TWL XL LVL3 (GOWN DISPOSABLE) IMPLANT
GOWN STRL REUS W/TWL LRG LVL3 (GOWN DISPOSABLE)
GOWN STRL REUS W/TWL XL LVL3 (GOWN DISPOSABLE) ×4 IMPLANT
NDL PRECISIONGLIDE 27X1.5 (NEEDLE) ×1 IMPLANT
NDL SAFETY ECLIPSE 18X1.5 (NEEDLE) IMPLANT
NEEDLE HYPO 18GX1.5 SHARP (NEEDLE)
NEEDLE PRECISIONGLIDE 27X1.5 (NEEDLE) ×2 IMPLANT
NS IRRIG 1000ML POUR BTL (IV SOLUTION) ×2 IMPLANT
PACK BASIN DAY SURGERY FS (CUSTOM PROCEDURE TRAY) ×2 IMPLANT
PAD CAST 3X4 CTTN HI CHSV (CAST SUPPLIES) IMPLANT
PADDING CAST COTTON 3X4 STRL (CAST SUPPLIES)
SPLINT FINGER 4.25 BULB 911906 (SOFTGOODS) ×1 IMPLANT
SPLINT PLASTER CAST XFAST 3X15 (CAST SUPPLIES) IMPLANT
SPLINT PLASTER XTRA FASTSET 3X (CAST SUPPLIES)
STOCKINETTE 4X48 STRL (DRAPES) ×2 IMPLANT
SUT CHROMIC 4 0 P 3 18 (SUTURE) ×1 IMPLANT
SUT ETHILON 4 0 PS 2 18 (SUTURE) ×2 IMPLANT
SUT VIC AB 4-0 P2 18 (SUTURE) IMPLANT
SYR BULB EAR ULCER 3OZ GRN STR (SYRINGE) ×2 IMPLANT
SYR CONTROL 10ML LL (SYRINGE) ×2 IMPLANT
TOWEL GREEN STERILE FF (TOWEL DISPOSABLE) ×2 IMPLANT
UNDERPAD 30X36 HEAVY ABSORB (UNDERPADS AND DIAPERS) ×1 IMPLANT

## 2020-08-06 NOTE — Anesthesia Procedure Notes (Signed)
Date/Time: 08/06/2020 10:31 AM Performed by: Glory Buff, CRNA Oxygen Delivery Method: Simple face mask

## 2020-08-06 NOTE — Discharge Instructions (Addendum)

## 2020-08-06 NOTE — H&P (Signed)
Alicia Medina is an 87 y.o. female.   Chief Complaint:masses  right small finger HPI: Alicia Medina is an 87 yo female complaining of deformities of her right ring finger left ring finger right small finger. She was last seen in 2018 and this was following release of trigger fingers right ring and small. She is right-handed and is now 87 years old. She is complaining of a mass at the PIP joint right small finger she is also complaining of arthritic changes with crookedness of her ring finger left hand. Is been going on for a year. She recalls no history of injury. She is not causing her any pain or discomfort. He has not had any treatment. Nothing makes it better or worse. She has a history of diabetes and arthritis no history of thyroid problems or gout    Past Medical History:  Diagnosis Date  . Arthritis   . CAD (coronary artery disease) CARDIOLOGIST-  DR Martinique   08-28-2011 Inferior STEMI with BMS to the RCA, complicated by cardiogenic shock, VF, VDRF , temporary CHB and abrupt reocclusion of the RCA stent treated with a second BMS to the RCA. Has residual LAD disease. EF is 50%  . Diverticulosis of colon   . Hemorrhoids, internal, with bleeding   . Hyperlipidemia   . Hypertension   . Hypothyroidism, postsurgical   . S/p bare metal coronary artery stent    2013  . Type 2 diabetes mellitus ([place])   . Wears hearing aid    both ears    Past Surgical History:  Procedure Laterality Date  . CARDIOVASCULAR STRESS TEST  10-03-2011  dr Martinique   normal perfusion / no ischemia/ ef 77%  . CARPAL TUNNEL RELEASE Right 01/25/2013   Procedure: CARPAL TUNNEL RELEASE;  Surgeon: Wynonia Sours, MD;  Location: [place];  Service: Orthopedics;  Laterality: Right;  . CARPAL TUNNEL RELEASE Left 10/25/2013   Procedure: LEFT CARPAL TUNNEL RELEASE;  Surgeon: Wynonia Sours, MD;  Location: [place];  Service: Orthopedics;  Laterality: Left;  . CATARACT EXTRACTION W/ INTRAOCULAR LENS   IMPLANT, BILATERAL  2007  . COLONOSCOPY  04-27-2003  . CORONARY ANGIOGRAM Left 08/28/2011   Procedure: CORONARY ANGIOGRAM;  Surgeon: Peter M Martinique, MD;  Location: [facility] CATH LAB;  Service: Cardiovascular;  Laterality: Left;  . CORONARY ANGIOPLASTY WITH STENT PLACEMENT  08-28-2011  dr Martinique   BMS to [facility] with abrupt reocclusion treated with second BMS/  LAD ostial 90%, proximal50-60%, first diagonal 80-90%  . CORONARY STENT INTERVENTION N/A 10/11/2018   Procedure: CORONARY STENT INTERVENTION;  Surgeon: Martinique, Peter M, MD;  Location: [place] CV LAB;  Service: Cardiovascular;  Laterality: N/A;  . LEFT HEART CATH AND CORONARY ANGIOGRAPHY N/A 10/11/2018   Procedure: LEFT HEART CATH AND CORONARY ANGIOGRAPHY;  Surgeon: Martinique, Peter M, MD;  Location: [place] CV LAB;  Service: Cardiovascular;  Laterality: N/A;  . LEFT HEART CATHETERIZATION WITH CORONARY ANGIOGRAM N/A 08/28/2011   Procedure: LEFT HEART CATHETERIZATION WITH CORONARY ANGIOGRAM;  Surgeon: Peter M Martinique, MD;  Location: [facility] CATH LAB;  Service: Cardiovascular;  Laterality: N/A;  . PERCUTANEOUS CORONARY STENT INTERVENTION (PCI-S) Left 08/28/2011   Procedure: PERCUTANEOUS CORONARY STENT INTERVENTION (PCI-S);  Surgeon: Peter M Martinique, MD;  Location: [facility] CATH LAB;  Service: Cardiovascular;  Laterality: Left;  . TOTAL THYROIDECTOMY Bilateral 05-02-2007   multinodular goiter  . TRANSANAL HEMORRHOIDAL DEARTERIALIZATION N/A 03/28/2014   Procedure: TRANSANAL HEMORRHOIDAL DEARTERIALIZATION;  Surgeon: Leighton Ruff, MD;  Location[place];  Service: General;  Laterality: N/A;  . TRANSTHORACIC ECHOCARDIOGRAM  08-30-2011   mild LVH/ ef 16-10%/  grade I diastolic dysfunction  . TRIGGER FINGER RELEASE Right 01/25/2013   Procedure: RELEASE TRIGGER FINGER/A-1 PULLEY RIGHT INDEX FINGER;  Surgeon: Wynonia Sours, MD;  Location: [place];  Service: Orthopedics;  Laterality: Right;  . TRIGGER FINGER RELEASE Left 10/25/2013   Procedure:  RELEASE A-1 PULLEY LEFT MIDDLE FINGER;  Surgeon: Wynonia Sours, MD;  Location: [place];  Service: Orthopedics;  Laterality: Left;  . TRIGGER FINGER RELEASE Left 12/04/2014   Procedure: RELEASE TRIGGER FINGER/A-1 PULLEY LEFT RING FINGER;  Surgeon: Daryll Brod, MD;  Location: [place];  Service: Orthopedics;  Laterality: Left;  . TRIGGER FINGER RELEASE Right 03/30/2017   Procedure: RELEASE TRIGGER FINGER/A-1 PULLEY RIGHT RING FINGER AND RIGHT SMALL FINGER;  Surgeon: Daryll Brod, MD;  Location: [place];  Service: Orthopedics;  Laterality: Right;    Family History  Problem Relation Age of Onset  . Stroke Mother   . Cancer Mother        breast  . Cancer Father        lung  . Stroke Father   . Diabetes Neg Hx    Social History:  reports that she has never smoked. She has never used smokeless tobacco. She reports current alcohol use of about 1.0 standard drink of alcohol per week. She reports that she does not use drugs.  Allergies:  Allergies  Allergen Reactions  . Demeclocycline Other (See Comments)    Severe stomach cramps Severe stomach cramps Severe stomach cramps  . Lisinopril Cough  . Tetracyclines & Related Other (See Comments)    Severe stomach cramps    No medications prior to admission.    No results found for this or any previous visit (from the past 48 hour(s)).  No results found.   Pertinent items are noted in HPI.  Height 5\' 2"  (9.604 m), weight 68.9 kg.  General appearance: cooperative and appears stated age Head: Normocephalic, without obvious abnormality Neck: no JVD Resp: clear to auscultation bilaterally Cardio: regular rate and rhythm, S1, S2 normal, no murmur, click, rub or gallop GI: soft, non-tender; bowel sounds normal; no masses,  no organomegaly Extremities: mass right small finger Pulses: 2+ and symmetric Skin: Skin color, texture, turgor normal. No rashes or lesions Neurologic: Grossly  normal Incision/Wound: na Assessment/Plan  Diagnosis is ganglion cyst PIPand DIP  joint right small finger and angulatory deformity with arthritis distal phalangeal joint left ring finger    Plan: We have discussed surgical excision of the cyst with debridement the PIP joint of her right small finger. Preperi-and postoperative course been discussed along with risks and complications. She is aware there is no guarantee to the surgery the possibility of infection recurrence injury to arteries nerves tendons incomplete relief symptoms possibility of dystrophy the possibility of stiffness. Would not recommend any treatment to the DIP joint of the left ring finger and that she has excellent mobility and no pain she would like to proceed with the right side this be scheduled as an outpatient under regional anesthesia    Daryll Brod 08/06/2020, 5:53 AM

## 2020-08-06 NOTE — Transfer of Care (Signed)
Immediate Anesthesia Transfer of Care Note  Patient: Alicia Medina  Procedure(s) Performed: EXCISION MASS AND DEBRIDEMENT PROXIMAL INTERPHALANGEAL JOINT RIGHT SMALL FINGER (Right Finger)  Patient Location: PACU  Anesthesia Type:MAC and Bier block  Level of Consciousness: awake, alert  and oriented  Airway & Oxygen Therapy: Patient Spontanous Breathing and Patient connected to face mask oxygen  Post-op Assessment: Report given to RN and Post -op Vital signs reviewed and stable  Post vital signs: Reviewed and stable  Last Vitals:  Vitals Value Taken Time  BP    Temp    Pulse 112 08/06/20 1114  Resp 13 08/06/20 1114  SpO2 100 % 08/06/20 1114  Vitals shown include unvalidated device data.  Last Pain:  Vitals:   08/06/20 0915  TempSrc: Oral  PainSc: 0-No pain         Complications: No complications documented.

## 2020-08-06 NOTE — Brief Op Note (Signed)
08/06/2020  11:15 AM  PATIENT:  Alicia Medina  87 y.o. female  PRE-OPERATIVE DIAGNOSIS:  MASS PROXIMAL INTERPHALANGEAL JOINT RIGHT SMALL FINGER  POST-OPERATIVE DIAGNOSIS:  MASS PROXIMAL INTERPHALANGEAL JOINT RIGHT SMALL FINGER  PROCEDURE:  Procedure(s) with comments: EXCISION MASS AND DEBRIDEMENT PROXIMAL INTERPHALANGEAL JOINT RIGHT SMALL FINGER (Right) - Bier block  SURGEON:  Surgeon(s) and Role:    * Daryll Brod, MD - Primary  PHYSICIAN ASSISTANT:   ASSISTANTS: none   ANESTHESIA:   local, regional and IV sedation  EBL:  0 mL   BLOOD ADMINISTERED:none  DRAINS: none   LOCAL MEDICATIONS USED:  BUPIVICAINE   SPECIMEN:  Excision  DISPOSITION OF SPECIMEN:  PATHOLOGY  COUNTS:  YES  TOURNIQUET:   Total Tourniquet Time Documented: Forearm (Right) - 31 minutes Total: Forearm (Right) - 31 minutes   DICTATION: .Viviann Spare Dictation  PLAN OF CARE: Admit for overnight observation  PATIENT DISPOSITION:  PACU - hemodynamically stable.

## 2020-08-06 NOTE — Progress Notes (Signed)
Surgical dressing removed by patient in bathroom. Alicia Dow, MD notified and came to bedside to redress. No additional instructions were needed at this time. Patient and friend Thayer Headings) educated again on importance of protecting extremity and keeping dressing clean, dry, and intact. All questions and concerns were answered.

## 2020-08-06 NOTE — Op Note (Signed)
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   ASSISTANT: none   ANESTHESIA:  Bier block with sedation and Local   INTRAVENOUS FLUIDS:  Per anesthesia flow sheet.   ESTIMATED BLOOD LOSS:  Minimal.   COMPLICATIONS:  None.   SPECIMENS:   Cyst synovial tissue and osteophyte   TOURNIQUET TIME:    Total Tourniquet Time Documented: Forearm (Right) - 31 minutes Total: Forearm (Right) - 31 minutes    DISPOSITION:  Stable to PACU.   INDICATIONS: Patient is an 87 year old female with a large mass of the dorsal aspect PIP joint right small finger with obvious arthritic changes on x-ray she has recently noted increased swelling of the distal phalangeal joint with a feeling of a mass may be occurring on the ulnar aspect is desirous of having the area explored and cyst removed if present.  She is well aware of risks and complications including infection recurrence injury to arteries nerves tendons complete relief symptoms just possibility of stiffness of the joints due to the arthritis.  She has had cyst and debridement was done in the past.  In the preoperative area the patient seen extremity marked by both patient and surgeon antibiotic  OPERATIVE COURSE: Patient is brought to the operating room where a forearm-based IV regional anesthetic was carried out without difficulty under the direction the anesthesia department.  She was prepped using ChloraPrep in the supine position with the right arm  free.  A 3-minute dry time was allowed timeout taken to confirm patient procedure.  A metacarpal block was given quarter percent bupivacaine without epinephrine proximal 7 to 8 cc was used.  A longitudinal incision was made directly over the mass of the PIP joint carried down through subcutaneous tissue.  A mass measuring approximately a centimeter in diameter was immediately encountered.  With blunt sharp dissection this was dissected free was noted to arise in the interval between the central slip and lateral band of the joint was opened in that position and debridement performed with a hemostatic rondure and house curette.  Specimen was sent to pathology from both the joint and the cyst.  The joint was copious irrigated with saline.  The extensor tendon was repaired with a running 4-0 chromic suture.  The skin was closed interrupted 4-0 nylon sutures.  Separate incision was then made distally and curvilinear over the DIP joint on the lateral aspect of the middle phalanx carried down through subcutaneous tissue of the flap was developed a small cyst was immediately encountered on the ulnar aspect of the extensor tendon the cyst was excised sent to pathology the joint was then opened on its lateral margin.  The hemostatic rondure and house curette were then used to remove and osteophyte from the ulnar aspect of the middle phalanx and a synovectomy performed of the dorsal capsule.  The wound was again copiously irrigated with saline and skin closed interrupted 4-0 nylon sutures.  Specimen was sent to pathology.  Sterile compressive dressing and  splint to the small finger was applied.  Deflation of the tourniquet remaining fingers pink.  She was taken to the recovery room for observation in satisfactory condition.  She will be discharged home to return to the hand center [facility] in 1week Tylenol for pain with Ultram for breakthrough.  She is maintained on her aspirin   Daryll Brod, MD Electronically signed,  08/06/20

## 2020-08-06 NOTE — Anesthesia Preprocedure Evaluation (Addendum)
Anesthesia Evaluation  Patient identified by MRN, date of birth, ID band Patient awake    Reviewed: NPO status , Patient's Chart, lab work & pertinent test results, reviewed documented beta blocker date and time   Airway Mallampati: II  TM Distance: >3 FB Neck ROM: Full    Dental  (+) Lower Dentures   Pulmonary neg pulmonary ROS,    Pulmonary exam normal        Cardiovascular hypertension, Pt. on medications and Pt. on home beta blockers + CAD, + Past MI, + Cardiac Stents (BMS 2013) and +CHF   Rhythm:Regular Rate:Normal     Neuro/Psych negative neurological ROS  negative psych ROS   GI/Hepatic Neg liver ROS, diverticulosis   Endo/Other  diabetes, Type 2, Insulin DependentHypothyroidism   Renal/GU   negative genitourinary   Musculoskeletal  (+) Arthritis , Osteoarthritis,    Abdominal (+)  Abdomen: soft. Bowel sounds: normal.  Peds  Hematology negative hematology ROS (+)   Anesthesia Other Findings   Reproductive/Obstetrics                             Anesthesia Physical Anesthesia Plan  ASA: III  Anesthesia Plan: Bier Block and Bier Block-LIDOCAINE ONLY   Post-op Pain Management:    Induction: Intravenous  PONV Risk Score and Plan: 2 and Ondansetron and Treatment may vary due to age or medical condition  Airway Management Planned: Simple Face Mask, Natural Airway and Nasal Cannula  Additional Equipment: None  Intra-op Plan:   Post-operative Plan:   Informed Consent: I have reviewed the patients History and Physical, chart, labs and discussed the procedure including the risks, benefits and alternatives for the proposed anesthesia with the patient or authorized representative who has indicated his/her understanding and acceptance.     Dental advisory given  Plan Discussed with: CRNA  Anesthesia Plan Comments: (Lab Results      Component                Value                Date                      NA                       139                 08/02/2020                K                        4.7                 08/02/2020                CO2                      26                  08/02/2020                GLUCOSE                  248 (H)             08/02/2020  BUN                      12                  08/02/2020                CREATININE               0.94                08/02/2020                CALCIUM                  8.9                 08/02/2020                GFRNONAA                 59 (L)              08/02/2020                GFRAA                    55 (L)              06/11/2020          )        Anesthesia Quick Evaluation

## 2020-08-06 NOTE — Anesthesia Procedure Notes (Signed)
Anesthesia Regional Block: Bier block (IV Regional)   Pre-Anesthetic Checklist: ,, timeout performed, Correct Patient, Correct Site, Correct Laterality, Correct Procedure, Correct Position, site marked, Risks and benefits discussed, Surgical consent,  Pre-op evaluation,  At surgeon's request  Laterality: Right  Prep: alcohol swabs       Needles:  Injection technique: Single-shot      Additional Needles:   Procedures:,,,,,, Esmarch exsanguination, single tourniquet utilized,  Narrative:  Start time: 08/06/2020 10:37 AM End time: 08/06/2020 10:38 AM  Performed by: Personally  CRNA: Glory Buff, CRNA

## 2020-08-06 NOTE — Anesthesia Postprocedure Evaluation (Signed)
Anesthesia Post Note  Patient: Alicia Medina  Procedure(s) Performed: EXCISION MASS AND DEBRIDEMENT PROXIMAL INTERPHALANGEAL JOINT RIGHT SMALL FINGER (Right Finger)     Patient location during evaluation: PACU Anesthesia Type: Bier Block and MAC Level of consciousness: awake and alert Pain management: pain level controlled Vital Signs Assessment: post-procedure vital signs reviewed and stable Respiratory status: spontaneous breathing, nonlabored ventilation, respiratory function stable and patient connected to nasal cannula oxygen Cardiovascular status: stable and blood pressure returned to baseline Postop Assessment: no apparent nausea or vomiting Anesthetic complications: no   No complications documented.  Last Vitals:  Vitals:   08/06/20 1136 08/06/20 1203  BP: 124/77 133/72  Pulse: 87 86  Resp: 16 14  Temp:  36.6 C  SpO2: 100% 96%    Last Pain:  Vitals:   08/06/20 1149  TempSrc:   PainSc: 0-No pain                 Belenda Cruise P Cystal Shannahan

## 2020-08-07 ENCOUNTER — Encounter (HOSPITAL_BASED_OUTPATIENT_CLINIC_OR_DEPARTMENT_OTHER): Payer: Self-pay | Admitting: Orthopedic Surgery

## 2020-08-07 LAB — SURGICAL PATHOLOGY

## 2020-08-14 DIAGNOSIS — M6740 Ganglion, unspecified site: Secondary | ICD-10-CM | POA: Insufficient documentation

## 2020-08-14 DIAGNOSIS — M19041 Primary osteoarthritis, right hand: Secondary | ICD-10-CM | POA: Insufficient documentation

## 2020-08-17 DIAGNOSIS — M25741 Osteophyte, right hand: Secondary | ICD-10-CM | POA: Diagnosis not present

## 2020-08-17 DIAGNOSIS — M71341 Other bursal cyst, right hand: Secondary | ICD-10-CM | POA: Diagnosis not present

## 2020-09-02 ENCOUNTER — Other Ambulatory Visit: Payer: Self-pay | Admitting: Endocrinology

## 2020-09-16 DIAGNOSIS — N819 Female genital prolapse, unspecified: Secondary | ICD-10-CM | POA: Diagnosis not present

## 2020-09-27 ENCOUNTER — Other Ambulatory Visit: Payer: Self-pay

## 2020-10-01 ENCOUNTER — Ambulatory Visit (INDEPENDENT_AMBULATORY_CARE_PROVIDER_SITE_OTHER): Payer: Medicare Other | Admitting: Endocrinology

## 2020-10-01 ENCOUNTER — Other Ambulatory Visit: Payer: Self-pay

## 2020-10-01 VITALS — BP 150/90 | HR 85 | Ht 63.0 in | Wt 151.0 lb

## 2020-10-01 DIAGNOSIS — N183 Chronic kidney disease, stage 3 unspecified: Secondary | ICD-10-CM | POA: Diagnosis not present

## 2020-10-01 DIAGNOSIS — Z794 Long term (current) use of insulin: Secondary | ICD-10-CM | POA: Diagnosis not present

## 2020-10-01 DIAGNOSIS — R809 Proteinuria, unspecified: Secondary | ICD-10-CM | POA: Diagnosis not present

## 2020-10-01 DIAGNOSIS — E1122 Type 2 diabetes mellitus with diabetic chronic kidney disease: Secondary | ICD-10-CM

## 2020-10-01 LAB — POCT GLYCOSYLATED HEMOGLOBIN (HGB A1C): Hemoglobin A1C: 7.5 % — AB (ref 4.0–5.6)

## 2020-10-01 MED ORDER — OZEMPIC (0.25 OR 0.5 MG/DOSE) 2 MG/1.5ML ~~LOC~~ SOPN: 0.5000 mg | PEN_INJECTOR | SUBCUTANEOUS | 3 refills | Status: DC

## 2020-10-01 NOTE — Patient Instructions (Addendum)
Your blood pressure is high today.  Please see your primary care provider soon, to have it rechecked Please continue the same insulin and:  I have sent a prescription to your pharmacy, to change the Trulicity to Ozempic, to see if this is cheaper. On this type of insulin schedule, you should eat meals on a regular schedule.  If a meal is missed or significantly delayed, your blood sugar could go low.  check your blood sugar twice a day.  vary the time of day when you check, between before the 3 meals, and at bedtime.  also check if you have symptoms of your blood sugar being too high or too low.  please keep a record of the readings and bring it to your next appointment here (or you can bring the meter itself).  You can write it on any piece of paper.  please call us sooner if your blood sugar goes below 70, or if you have a lot of readings over 200.   Please come back for a follow-up appointment in 2 months.

## 2020-10-01 NOTE — Progress Notes (Signed)
Subjective:    Patient ID: Alicia Medina, female    DOB: 06-26-1933, 87 y.o.   MRN: [id number]  HPI Pt returns for f/u of diabetes mellitus:  DM type: Insulin-requiring type 2 Dx'ed: 2671 Complications: CAD, PN, and stage 3 CRI.  Therapy: insulin since 2458, and Trulicity. GDM: never.  DKA: never.   Severe hypoglycemia: never.   Pancreatitis: never.  Pancreatic imaging: never Other: she declines multiple daily injections; She did not tolerate metformin (diarrhea); she changed lantus to levemir, then NPH, based on the pattern of cbg's.   Interval history: she brings a record of her cbg's which I have reviewed today.  cbg varies from 119-246.  There is no trend throughout the day. Pt says she seldom misses the insulin.  husb died 6 weeks ago.   She had thyroidect in 2008 (benign pathol; she has been on synthroid since then; she requests brand name synthroid).  Past Medical History:  Diagnosis Date  . Arthritis   . CAD (coronary artery disease) CARDIOLOGIST-  DR Martinique   08-28-2011 Inferior STEMI with BMS to the RCA, complicated by cardiogenic shock, VF, VDRF , temporary CHB and abrupt reocclusion of the RCA stent treated with a second BMS to the RCA. Has residual LAD disease. EF is 50%  . Diverticulosis of colon   . Hemorrhoids, internal, with bleeding   . Hyperlipidemia   . Hypertension   . Hypothyroidism, postsurgical   . S/p bare metal coronary artery stent    2013  . Type 2 diabetes mellitus ([place])   . Wears hearing aid    both ears    Past Surgical History:  Procedure Laterality Date  . CARDIOVASCULAR STRESS TEST  10-03-2011  dr Martinique   normal perfusion / no ischemia/ ef 77%  . CARPAL TUNNEL RELEASE Right 01/25/2013   Procedure: CARPAL TUNNEL RELEASE;  Surgeon: Wynonia Sours, MD;  Location: [facility];  Service: Orthopedics;  Laterality: Right;  . CARPAL TUNNEL RELEASE Left 10/25/2013   Procedure: LEFT CARPAL TUNNEL RELEASE;  Surgeon: Wynonia Sours, MD;   Location: [facility];  Service: Orthopedics;  Laterality: Left;  . CATARACT EXTRACTION W/ INTRAOCULAR LENS  IMPLANT, BILATERAL  2007  . COLONOSCOPY  04-27-2003  . CORONARY ANGIOGRAM Left 08/28/2011   Procedure: CORONARY ANGIOGRAM;  Surgeon: Peter M Martinique, MD;  Location: [facility] CATH LAB;  Service: Cardiovascular;  Laterality: Left;  . CORONARY ANGIOPLASTY WITH STENT PLACEMENT  08-28-2011  dr Martinique   BMS to [facility] with abrupt reocclusion treated with second BMS/  LAD ostial 90%, proximal50-60%, first diagonal 80-90%  . CORONARY STENT INTERVENTION N/A 10/11/2018   Procedure: CORONARY STENT INTERVENTION;  Surgeon: Martinique, Peter M, MD;  Location: [place] CV LAB;  Service: Cardiovascular;  Laterality: N/A;  . LEFT HEART CATH AND CORONARY ANGIOGRAPHY N/A 10/11/2018   Procedure: LEFT HEART CATH AND CORONARY ANGIOGRAPHY;  Surgeon: Martinique, Peter M, MD;  Location: [place] CV LAB;  Service: Cardiovascular;  Laterality: N/A;  . LEFT HEART CATHETERIZATION WITH CORONARY ANGIOGRAM N/A 08/28/2011   Procedure: LEFT HEART CATHETERIZATION WITH CORONARY ANGIOGRAM;  Surgeon: Peter M Martinique, MD;  Location: [facility] CATH LAB;  Service: Cardiovascular;  Laterality: N/A;  . MASS EXCISION Right 08/06/2020   Procedure: EXCISION MASS AND DEBRIDEMENT PROXIMAL INTERPHALANGEAL JOINT RIGHT SMALL FINGER;  Surgeon: Daryll Brod, MD;  Location: [place];  Service: Orthopedics;  Laterality: Right;  Bier block  . PERCUTANEOUS CORONARY STENT INTERVENTION (PCI-S) Left 08/28/2011  Procedure: PERCUTANEOUS CORONARY STENT INTERVENTION (PCI-S);  Surgeon: Peter M Martinique, MD;  Location: [facility] CATH LAB;  Service: Cardiovascular;  Laterality: Left;  . TOTAL THYROIDECTOMY Bilateral 05-02-2007   multinodular goiter  . TRANSANAL HEMORRHOIDAL DEARTERIALIZATION N/A 03/28/2014   Procedure: TRANSANAL HEMORRHOIDAL DEARTERIALIZATION;  Surgeon: Leighton Ruff, MD;  Location: [facility];  Service: General;  Laterality: N/A;   . TRANSTHORACIC ECHOCARDIOGRAM  08-30-2011   mild LVH/ ef 96-28%/  grade I diastolic dysfunction  . TRIGGER FINGER RELEASE Right 01/25/2013   Procedure: RELEASE TRIGGER FINGER/A-1 PULLEY RIGHT INDEX FINGER;  Surgeon: Wynonia Sours, MD;  Location: [place];  Service: Orthopedics;  Laterality: Right;  . TRIGGER FINGER RELEASE Left 10/25/2013   Procedure: RELEASE A-1 PULLEY LEFT MIDDLE FINGER;  Surgeon: Wynonia Sours, MD;  Location: [place];  Service: Orthopedics;  Laterality: Left;  . TRIGGER FINGER RELEASE Left 12/04/2014   Procedure: RELEASE TRIGGER FINGER/A-1 PULLEY LEFT RING FINGER;  Surgeon: Daryll Brod, MD;  Location: [place];  Service: Orthopedics;  Laterality: Left;  . TRIGGER FINGER RELEASE Right 03/30/2017   Procedure: RELEASE TRIGGER FINGER/A-1 PULLEY RIGHT RING FINGER AND RIGHT SMALL FINGER;  Surgeon: Daryll Brod, MD;  Location: [place];  Service: Orthopedics;  Laterality: Right;    Social History   Socioeconomic History  . Marital status: Married    Spouse name: Not on file  . Number of children: Not on file  . Years of education: Not on file  . Highest education level: Not on file  Occupational History  . Not on file  Tobacco Use  . Smoking status: Never Smoker  . Smokeless tobacco: Never Used  Vaping Use  . Vaping Use: Never used  Substance and Sexual Activity  . Alcohol use: Yes    Alcohol/week: 1.0 standard drink    Types: 1 Glasses of wine per week    Comment: social  . Drug use: No  . Sexual activity: Not on file  Other Topics Concern  . Not on file  Social History Narrative   Lives with husband.   Has 2 sons- both healthy.   Younger son lives in [place], [place] in order son lives in [place].   Social Determinants of Health   Financial Resource Strain: Not on file  Food Insecurity: Not on file  Transportation Needs: Not on file  Physical Activity: Not on file  Stress: Not on file  Social  Connections: Not on file  Intimate Partner Violence: Not on file    Current Outpatient Medications on File Prior to Visit  Medication Sig Dispense Refill  . acetaminophen (TYLENOL) 500 MG tablet Take 1,000 mg by mouth at bedtime.    . Ascorbic Acid (VITAMIN C) 1000 MG tablet Take 1,000 mg by mouth every evening.    . Aspirin Buf,CaCarb-MgCarb-MgO, 81 MG TABS aspirin 81 mg tablet  Take by oral route.    Marland Kitchen aspirin EC 81 MG tablet Take 81 mg by mouth daily.    . Biotin w/ Vitamins C & E (HAIR/SKIN/NAILS PO) Take 1 tablet by mouth 2 (two) times daily.    Marland Kitchen bismuth subsalicylate (PEPTO BISMOL) 262 MG chewable tablet Chew 131-262 mg by mouth as directed.    . calcium carbonate (OSCAL) 1500 (600 Ca) MG TABS tablet Take 600 mg of elemental calcium by mouth once a week.     . Cholecalciferol (VITAMIN D-3) 125 MCG (5000 UT) TABS Take 5,000 Units by mouth daily.    Marland Kitchen  Chromium Picolinate 1000 MCG TABS Take 1,000 mcg by mouth daily.    . Coenzyme Q10 200 MG TABS Take 200 mg by mouth daily.     . COLLAGEN PO Take 1,000 mg by mouth 2 (two) times daily.    Marland Kitchen ESTRACE VAGINAL 0.1 MG/GM vaginal cream     . folic acid (FOLVITE) 528 MCG tablet Take 800 mcg by mouth daily.    Marland Kitchen glucose blood (TRUE METRIX BLOOD GLUCOSE TEST) test strip 1 each by Other route 2 (two) times daily. And lancets 2/day 200 each 3  . Insulin NPH, Human,, Isophane, (NOVOLIN N FLEXPEN) 100 UNIT/ML Kiwkpen Inject 15 Units into the skin every morning. 15 mL 2  . loperamide (IMODIUM A-D) 2 MG tablet Take 1-2 mg by mouth as directed.    . loratadine (CLARITIN) 10 MG tablet Take 10 mg by mouth at bedtime.    Marland Kitchen losartan (COZAAR) 25 MG tablet TAKE 1 TABLET (25 MG TOTAL) BY MOUTH EVERY MORNING. 90 tablet 3  . Magnesium 250 MG TABS Take 250 mg by mouth daily.    . Multiple Vitamin (MULTIVITAMIN) tablet Take 1 tablet by mouth once a week.     . nitrofurantoin, macrocrystal-monohydrate, (MACROBID) 100 MG capsule Take 100 mg by mouth 2 (two) times  daily.    . Psyllium (METAMUCIL PO) Take 1 capsule by mouth daily.    Marland Kitchen sulfamethoxazole-trimethoprim (BACTRIM DS) 800-160 MG tablet Take 1 tablet by mouth 2 (two) times daily.    Marland Kitchen SYNTHROID 175 MCG tablet TAKE 1 TABLET (175 MCG TOTAL) BY MOUTH DAILY BEFORE BREAKFAST. 90 tablet 1  . traMADol (ULTRAM) 50 MG tablet Take 1 tablet (50 mg total) by mouth every 6 (six) hours as needed. 20 tablet 0  . vitamin B-12 (CYANOCOBALAMIN) 500 MCG tablet Take 500 mcg by mouth daily.    Marland Kitchen VITAMIN E PO Take 500 mg by mouth 2 (two) times a week.      No current facility-administered medications on file prior to visit.    Allergies  Allergen Reactions  . Demeclocycline Other (See Comments)    Severe stomach cramps Severe stomach cramps Severe stomach cramps  . Lisinopril Cough  . Tetracyclines & Related Other (See Comments)    Severe stomach cramps    Family History  Problem Relation Age of Onset  . Stroke Mother   . Cancer Mother        breast  . Cancer Father        lung  . Stroke Father   . Diabetes Neg Hx     BP (!) 150/90 (BP Location: Right Arm, Patient Position: Sitting, Cuff Size: Normal)   Pulse 85   Ht 5\' 3"  (1.6 m)   Wt 151 lb (68.5 kg)   SpO2 97%   BMI 26.75 kg/m    Review of Systems     Objective:   Physical Exam VITAL SIGNS:  See vs page GENERAL: no distress Pulses: dorsalis pedis intact bilat.   MSK: no deformity of the feet CV: no leg edema Skin:  no ulcer on the feet.  normal color and temp on the feet. Neuro: sensation is intact to touch on the feet, but decreased from normal   Lab Results  Component Value Date   HGBA1C 7.5 (A) 10/01/2020       Assessment & Plan:  Insulin-requiring type 2 DM,with stage 3 CRI: uncontrolled.   HTN: is noted today.    Patient Instructions  Your blood pressure is high  today.  Please see your primary care provider soon, to have it rechecked Please continue the same insulin and:  I have sent a prescription to your  pharmacy, to change the Trulicity to Ozempic, to see if this is cheaper. On this type of insulin schedule, you should eat meals on a regular schedule.  If a meal is missed or significantly delayed, your blood sugar could go low.  check your blood sugar twice a day.  vary the time of day when you check, between before the 3 meals, and at bedtime.  also check if you have symptoms of your blood sugar being too high or too low.  please keep a record of the readings and bring it to your next appointment here (or you can bring the meter itself).  You can write it on any piece of paper.  please call us sooner if your blood sugar goes below 70, or if you have a lot of readings over 200.   Please come back for a follow-up appointment in 2 months.

## 2020-10-03 ENCOUNTER — Other Ambulatory Visit: Payer: Self-pay | Admitting: Cardiology

## 2020-10-21 DIAGNOSIS — R195 Other fecal abnormalities: Secondary | ICD-10-CM | POA: Diagnosis not present

## 2020-10-21 DIAGNOSIS — R194 Change in bowel habit: Secondary | ICD-10-CM | POA: Diagnosis not present

## 2020-10-21 DIAGNOSIS — K625 Hemorrhage of anus and rectum: Secondary | ICD-10-CM | POA: Diagnosis not present

## 2020-10-23 ENCOUNTER — Telehealth: Payer: Self-pay | Admitting: *Deleted

## 2020-10-23 NOTE — Telephone Encounter (Signed)
   [facility] Pre-operative Risk Assessment     Request for surgical clearance:  1. What type of surgery is being performed? COLONOSCOPY    2. When is this surgery scheduled? 10/29/20   3. What type of clearance is required (medical clearance vs. Pharmacy clearance to hold med vs. Both)?   4. Are there any medications that need to be held prior to surgery and how long?   5. Practice name and name of physician performing surgery? [facility]  6. What is the office phone number? [phone]   7.   What is the office fax number? [phone]  8.   Anesthesia type (None, local, MAC, general) ? PROPOFOL    Alvina Filbert 10/23/2020, 7:06 PM  _________________________________________________________________   (provider comments below)

## 2020-10-24 NOTE — Telephone Encounter (Signed)
   Primary Cardiologist: Peter Martinique, MD  Chart reviewed as part of pre-operative protocol coverage. Given past medical history and time since last visit, based on ACC/AHA guidelines, VIANNA VENEZIA would be at acceptable risk for the planned procedure without further cardiovascular testing.   I will route this recommendation to the requesting party via Epic fax function and remove from pre-op pool.  Please call with questions.  Jossie Ng. Earle Burson NP-C    10/24/2020, 7:58 AM [place] [address] Office [phone] Fax [phone]

## 2020-10-29 DIAGNOSIS — R195 Other fecal abnormalities: Secondary | ICD-10-CM | POA: Diagnosis not present

## 2020-10-29 DIAGNOSIS — D120 Benign neoplasm of cecum: Secondary | ICD-10-CM | POA: Diagnosis not present

## 2020-10-29 DIAGNOSIS — K5520 Angiodysplasia of colon without hemorrhage: Secondary | ICD-10-CM | POA: Diagnosis not present

## 2020-10-29 DIAGNOSIS — K635 Polyp of colon: Secondary | ICD-10-CM | POA: Diagnosis not present

## 2020-12-04 ENCOUNTER — Ambulatory Visit: Payer: Medicare Other | Admitting: Endocrinology

## 2020-12-11 DIAGNOSIS — N819 Female genital prolapse, unspecified: Secondary | ICD-10-CM | POA: Diagnosis not present

## 2020-12-13 NOTE — Progress Notes (Signed)
Cardiology Office Note    Date:  12/16/2020   ID:  Alicia Medina, DOB 01-30-1933, MRN [id number]  PCP:  Pcp, No  Cardiologist:  Dr. Martinique   Chief Complaint  Patient presents with  . Coronary Artery Disease    History of Present Illness:  Alicia Medina is a 87 y.o. female with PMH of CAD, HTN, HLD, hypothyroidism, and DM II. patient had a inferior MI treated with BMS to RCA in 08/2011.  She required defibrillation multiple times and her hospitalization was complicated by cardiogenic shock, V. fib, VDRF, and complete heart block requiring temporary pacemaker.  Unfortunately, she had abrupt occlusion of the RCA stent and was treated with a second BMS to RCA.  Follow-up Myoview in March 2013 showed normal perfusion with EF of 76%.  Patient was seen in Feb 2020 with  intermittent chest pain radiating to her back between her shoulder blades.  Myoview showed moderate perfusion defect in the inferoapex, lateral apex, and and her apex that is new when compared to 2013.  The study was interpreted as intermediate risk with EF of 63%.  Patient ultimately underwent elective cardiac catheterization on 10/11/2018 which revealed a 100% proximal to mid LAD occlusion which was treated with 2.75 x 28 mm Synergy DES, widely patent mid RCA stent.   On follow up today she is doing well. She denies any significant chest pain or dyspnea. No palpitations. Her husband passed away in 31-Jul-2022. She states her son in [place] had to have CABG. She did have hand surgery earlier this year.   Past Medical History:  Diagnosis Date  . Arthritis   . CAD (coronary artery disease) CARDIOLOGIST-  DR Martinique   08-28-2011 Inferior STEMI with BMS to the RCA, complicated by cardiogenic shock, VF, VDRF , temporary CHB and abrupt reocclusion of the RCA stent treated with a second BMS to the RCA. Has residual LAD disease. EF is 50%  . Diverticulosis of colon   . Hemorrhoids, internal, with bleeding   . Hyperlipidemia   .  Hypertension   . Hypothyroidism, postsurgical   . S/p bare metal coronary artery stent    2013  . Type 2 diabetes mellitus ([place])   . Wears hearing aid    both ears    Past Surgical History:  Procedure Laterality Date  . CARDIOVASCULAR STRESS TEST  10-03-2011  dr Martinique   normal perfusion / no ischemia/ ef 77%  . CARPAL TUNNEL RELEASE Right 01/25/2013   Procedure: CARPAL TUNNEL RELEASE;  Surgeon: Wynonia Sours, MD;  Location: [place];  Service: Orthopedics;  Laterality: Right;  . CARPAL TUNNEL RELEASE Left 10/25/2013   Procedure: LEFT CARPAL TUNNEL RELEASE;  Surgeon: Wynonia Sours, MD;  Location: [place];  Service: Orthopedics;  Laterality: Left;  . CATARACT EXTRACTION W/ INTRAOCULAR LENS  IMPLANT, BILATERAL  2007  . COLONOSCOPY  04-27-2003  . CORONARY ANGIOGRAM Left 08/28/2011   Procedure: CORONARY ANGIOGRAM;  Surgeon: Elidia Bonenfant M [place]ique, MD;  Location: [facility] CATH LAB;  Service: Cardiovascular;  Laterality: Left;  . CORONARY ANGIOPLASTY WITH STENT PLACEMENT  08-28-2011  dr Martinique   BMS to [facility] with abrupt reocclusion treated with second BMS/  LAD ostial 90%, proximal50-60%, first diagonal 80-90%  . CORONARY STENT INTERVENTION N/A 10/11/2018   Procedure: CORONARY STENT INTERVENTION;  Surgeon: Martinique, Tyneisha Hegeman M, MD;  Location: [place] CV LAB;  Service: Cardiovascular;  Laterality: N/A;  . LEFT HEART CATH AND CORONARY ANGIOGRAPHY N/A 10/11/2018  Procedure: LEFT HEART CATH AND CORONARY ANGIOGRAPHY;  Surgeon: Martinique, Dartanyan Deasis M, MD;  Location: [place] CV LAB;  Service: Cardiovascular;  Laterality: N/A;  . LEFT HEART CATHETERIZATION WITH CORONARY ANGIOGRAM N/A 08/28/2011   Procedure: LEFT HEART CATHETERIZATION WITH CORONARY ANGIOGRAM;  Surgeon: Dessiree Sze M [place]ique, MD;  Location: [facility] CATH LAB;  Service: Cardiovascular;  Laterality: N/A;  . MASS EXCISION Right 08/06/2020   Procedure: EXCISION MASS AND DEBRIDEMENT PROXIMAL INTERPHALANGEAL JOINT RIGHT SMALL FINGER;  Surgeon: Daryll Brod, MD;  Location: [place];  Service: Orthopedics;  Laterality: Right;  Bier block  . PERCUTANEOUS CORONARY STENT INTERVENTION (PCI-S) Left 08/28/2011   Procedure: PERCUTANEOUS CORONARY STENT INTERVENTION (PCI-S);  Surgeon: Kaylin Marcon M Martinique, MD;  Location: [facility] CATH LAB;  Service: Cardiovascular;  Laterality: Left;  . TOTAL THYROIDECTOMY Bilateral 05-02-2007   multinodular goiter  . TRANSANAL HEMORRHOIDAL DEARTERIALIZATION N/A 03/28/2014   Procedure: TRANSANAL HEMORRHOIDAL DEARTERIALIZATION;  Surgeon: Leighton Ruff, MD;  Location: [facility];  Service: General;  Laterality: N/A;  . TRANSTHORACIC ECHOCARDIOGRAM  08-30-2011   mild LVH/ ef 40-34%/  grade I diastolic dysfunction  . TRIGGER FINGER RELEASE Right 01/25/2013   Procedure: RELEASE TRIGGER FINGER/A-1 PULLEY RIGHT INDEX FINGER;  Surgeon: Wynonia Sours, MD;  Location: [place];  Service: Orthopedics;  Laterality: Right;  . TRIGGER FINGER RELEASE Left 10/25/2013   Procedure: RELEASE A-1 PULLEY LEFT MIDDLE FINGER;  Surgeon: Wynonia Sours, MD;  Location: [place];  Service: Orthopedics;  Laterality: Left;  . TRIGGER FINGER RELEASE Left 12/04/2014   Procedure: RELEASE TRIGGER FINGER/A-1 PULLEY LEFT RING FINGER;  Surgeon: Daryll Brod, MD;  Location: [place];  Service: Orthopedics;  Laterality: Left;  . TRIGGER FINGER RELEASE Right 03/30/2017   Procedure: RELEASE TRIGGER FINGER/A-1 PULLEY RIGHT RING FINGER AND RIGHT SMALL FINGER;  Surgeon: Daryll Brod, MD;  Location: [place];  Service: Orthopedics;  Laterality: Right;    Current Medications: Outpatient Medications Prior to Visit  Medication Sig Dispense Refill  . acetaminophen (TYLENOL) 500 MG tablet Take 1,000 mg by mouth at bedtime.    . Ascorbic Acid (VITAMIN C) 1000 MG tablet Take 1,000 mg by mouth every evening.    . Aspirin Buf,CaCarb-MgCarb-MgO, 81 MG TABS aspirin 81 mg tablet  Take by oral route.     Marland Kitchen aspirin EC 81 MG tablet Take 81 mg by mouth daily.    . Biotin w/ Vitamins C & E (HAIR/SKIN/NAILS PO) Take 1 tablet by mouth 2 (two) times daily.    Marland Kitchen bismuth subsalicylate (PEPTO BISMOL) 262 MG chewable tablet Chew 131-262 mg by mouth as directed.    . calcium carbonate (OSCAL) 1500 (600 Ca) MG TABS tablet Take 600 mg of elemental calcium by mouth once a week.     . Cholecalciferol (VITAMIN D-3) 125 MCG (5000 UT) TABS Take 5,000 Units by mouth daily.    . Chromium Picolinate 1000 MCG TABS Take 1,000 mcg by mouth daily.    . Coenzyme Q10 200 MG TABS Take 200 mg by mouth daily.     . COLLAGEN PO Take 1,000 mg by mouth 2 (two) times daily.    Marland Kitchen ESTRACE VAGINAL 0.1 MG/GM vaginal cream     . folic acid (FOLVITE) 742 MCG tablet Take 800 mcg by mouth daily.    Marland Kitchen glucose blood (TRUE METRIX BLOOD GLUCOSE TEST) test strip 1 each by Other route 2 (two) times daily. And lancets 2/day 200 each 3  .  Insulin NPH, Human,, Isophane, (NOVOLIN N FLEXPEN) 100 UNIT/ML Kiwkpen Inject 15 Units into the skin every morning. 15 mL 2  . loperamide (IMODIUM A-D) 2 MG tablet Take 1-2 mg by mouth as directed.    . loratadine (CLARITIN) 10 MG tablet Take 10 mg by mouth at bedtime.    Marland Kitchen losartan (COZAAR) 25 MG tablet TAKE 1 TABLET (25 MG TOTAL) BY MOUTH EVERY MORNING. 90 tablet 3  . Magnesium 250 MG TABS Take 250 mg by mouth daily.    . metoprolol tartrate (LOPRESSOR) 50 MG tablet TAKE 1 TABLET BY MOUTH 2 TIMES DAILY. 180 tablet 3  . Multiple Vitamin (MULTIVITAMIN) tablet Take 1 tablet by mouth once a week.     . nitrofurantoin, macrocrystal-monohydrate, (MACROBID) 100 MG capsule Take 100 mg by mouth 2 (two) times daily.    . Psyllium (METAMUCIL PO) Take 1 capsule by mouth daily.    . rosuvastatin (CRESTOR) 20 MG tablet TAKE 1 TABLET (20 MG TOTAL) BY MOUTH DAILY. 90 tablet 3  . Semaglutide,0.25 or 0.5MG /DOS, (OZEMPIC, 0.25 OR 0.5 MG/DOSE,) 2 MG/1.5ML SOPN Inject 0.5 mg into the skin once a week. 4.5 mL 3  .  sulfamethoxazole-trimethoprim (BACTRIM DS) 800-160 MG tablet Take 1 tablet by mouth 2 (two) times daily.    Marland Kitchen SYNTHROID 175 MCG tablet TAKE 1 TABLET (175 MCG TOTAL) BY MOUTH DAILY BEFORE BREAKFAST. 90 tablet 1  . traMADol (ULTRAM) 50 MG tablet Take 1 tablet (50 mg total) by mouth every 6 (six) hours as needed. 20 tablet 0  . vitamin B-12 (CYANOCOBALAMIN) 500 MCG tablet Take 500 mcg by mouth daily.    Marland Kitchen VITAMIN E PO Take 500 mg by mouth 2 (two) times a week.      No facility-administered medications prior to visit.     Allergies:   Demeclocycline, Lisinopril, and Tetracyclines & related   Social History   Socioeconomic History  . Marital status: Married    Spouse name: Not on file  . Number of children: Not on file  . Years of education: Not on file  . Highest education level: Not on file  Occupational History  . Not on file  Tobacco Use  . Smoking status: Never Smoker  . Smokeless tobacco: Never Used  Vaping Use  . Vaping Use: Never used  Substance and Sexual Activity  . Alcohol use: Yes    Alcohol/week: 1.0 standard drink    Types: 1 Glasses of wine per week    Comment: social  . Drug use: No  . Sexual activity: Not on file  Other Topics Concern  . Not on file  Social History Narrative   Lives with husband.   Has 2 sons- both healthy.   Younger son lives in [place], [place] in order son lives in [place].   Social Determinants of Health   Financial Resource Strain: Not on file  Food Insecurity: Not on file  Transportation Needs: Not on file  Physical Activity: Not on file  Stress: Not on file  Social Connections: Not on file     Family History:  The patient's family history includes Cancer in her father and mother; Stroke in her father and mother.   ROS:   Please see the history of present illness.    ROS All other systems reviewed and are negative.   PHYSICAL EXAM:   VS:  BP 122/78 (BP Location: Left Arm, Patient Position: Sitting, Cuff Size: Normal)   Pulse  88   Ht 5\' 3"  (1.6  m)   Wt 153 lb 3.2 oz (69.5 kg)   BMI 27.14 kg/m    GEN: Well nourished, well developed, in no acute distress  HEENT: normal  Neck: no JVD, carotid bruits, or masses Cardiac: RRR; no murmurs, rubs, or gallops,no edema  Respiratory:  clear to auscultation bilaterally, normal work of breathing GI: soft, nontender, nondistended, + BS MS: no deformity or atrophy  Skin: warm and dry, no rash Neuro:  Alert and Oriented x 3, Strength and sensation are intact Psych: euthymic mood, full affect  Wt Readings from Last 3 Encounters:  12/16/20 153 lb 3.2 oz (69.5 kg)  10/01/20 151 lb (68.5 kg)  08/06/20 156 lb 8.4 oz (71 kg)      Studies/Labs Reviewed:   EKG:  EKG is  ordered today.  NSR with PACs. Rate 85. Low voltage. I have personally reviewed and interpreted this study.   Recent Labs: 06/11/2020: ALT 21 08/02/2020: BUN 12; Creatinine, Ser 0.94; Potassium 4.7; Sodium 139  Dated 10/01/20: A1c 7.5%.   Lipid Panel    Component Value Date/Time   CHOL 135 06/11/2020 1205   TRIG 121 06/11/2020 1205   HDL 47 06/11/2020 1205   CHOLHDL 2.9 06/11/2020 1205   CHOLHDL 2.6 12/10/2016 0954   VLDL 26 12/10/2016 0954   LDLCALC 66 06/11/2020 1205   Ecg is not done today.   Additional studies/ records that were reviewed today include:   Myoview 09/22/18: Study Highlights    The left ventricular ejection fraction is normal (55-65%).  Nuclear stress EF: 63%.  There was no ST segment deviation noted during stress.  Defect 1: There is a medium size severe perfusion defect present in the apical anterior, apical inferior, apical lateral and apex location.  Defect 2: There is a small defect of severe severity present in the mid anterior location.  Findings consistent with prior myocardial infarction.  This is an intermediate risk study.   Medium size severe perfusion defect in the inferoapex, lateral apex and anteroapex, fixed without evidence of ischemia.  Small fixed  perfusion defect in the anterior wall at the mid ventricle.  Evidence of infarction without evidence of ischemia.     Cath 10/11/2018  Prox LAD to Mid LAD lesion is 100% stenosed.  Previously placed Mid RCA stent (unknown type) is widely patent.  Post intervention, there is a 0% residual stenosis.  A drug-eluting stent was successfully placed using a STENT SYNERGY DES [id number].  LV end diastolic pressure is normal.   1. Single vessel occlusive CAD with CTO of the mid LAD 2. Continued patency of stents in the RCA 3. Normal LVEDP 4. Successful CTO PCI of the mid LAD with DES x 1  Plan: DAPT for one year. Patient is a candidate for same day discharge.     ASSESSMENT:    1. Coronary artery disease involving native coronary artery of native heart without angina pectoris   2. Type 2 diabetes mellitus without complication, with long-term current use of insulin ([place])   3. Essential hypertension   4. Hyperlipidemia associated with type 2 diabetes mellitus ([place])      PLAN:  In order of problems listed above:  1. CAD: S/p remote BMS of RCA. s/p DES of mid LAD in March 2020.  Continue ASA 81 mg daily. On statin and beta blocker.   She is asymptomatic.   2. Hypertension: BP is well controlled.  3. Hyperlipidemia: Continue statin therapy, Crestor 20 mg daily. LDL 66. At goal  4.  DM2: Managed by Dr Loanne Drilling. Last A1c 7.5%.   5. Hypothyroidism: On Synthroid    Follow up in 6 months.  Medication Adjustments/Labs and Tests Ordered: Current medicines are reviewed at length with the patient today.  Concerns regarding medicines are outlined above.  Medication changes, Labs and Tests ordered today are listed in the Patient Instructions below. There are no Patient Instructions on file for this visit.   Signed, Sofia Vanmeter Martinique, MD  12/16/2020 11:54 AM    [facility] [place], [place],   [address] Phone: [phone]; Fax: [phone]

## 2020-12-16 ENCOUNTER — Other Ambulatory Visit: Payer: Self-pay

## 2020-12-16 ENCOUNTER — Ambulatory Visit (INDEPENDENT_AMBULATORY_CARE_PROVIDER_SITE_OTHER): Payer: Medicare Other | Admitting: Cardiology

## 2020-12-16 ENCOUNTER — Encounter: Payer: Self-pay | Admitting: Cardiology

## 2020-12-16 VITALS — BP 122/78 | HR 88 | Ht 63.0 in | Wt 153.2 lb

## 2020-12-16 DIAGNOSIS — Z794 Long term (current) use of insulin: Secondary | ICD-10-CM

## 2020-12-16 DIAGNOSIS — E785 Hyperlipidemia, unspecified: Secondary | ICD-10-CM | POA: Diagnosis not present

## 2020-12-16 DIAGNOSIS — I10 Essential (primary) hypertension: Secondary | ICD-10-CM | POA: Diagnosis not present

## 2020-12-16 DIAGNOSIS — E119 Type 2 diabetes mellitus without complications: Secondary | ICD-10-CM

## 2020-12-16 DIAGNOSIS — I2510 Atherosclerotic heart disease of native coronary artery without angina pectoris: Secondary | ICD-10-CM

## 2020-12-16 DIAGNOSIS — E1169 Type 2 diabetes mellitus with other specified complication: Secondary | ICD-10-CM

## 2020-12-24 ENCOUNTER — Other Ambulatory Visit: Payer: Self-pay

## 2020-12-24 ENCOUNTER — Ambulatory Visit (INDEPENDENT_AMBULATORY_CARE_PROVIDER_SITE_OTHER): Payer: Medicare Other | Admitting: Endocrinology

## 2020-12-24 VITALS — BP 130/64 | HR 73 | Ht 63.0 in | Wt 151.6 lb

## 2020-12-24 DIAGNOSIS — E890 Postprocedural hypothyroidism: Secondary | ICD-10-CM

## 2020-12-24 DIAGNOSIS — I2510 Atherosclerotic heart disease of native coronary artery without angina pectoris: Secondary | ICD-10-CM

## 2020-12-24 DIAGNOSIS — N1831 Chronic kidney disease, stage 3a: Secondary | ICD-10-CM | POA: Diagnosis not present

## 2020-12-24 DIAGNOSIS — E1122 Type 2 diabetes mellitus with diabetic chronic kidney disease: Secondary | ICD-10-CM | POA: Diagnosis not present

## 2020-12-24 DIAGNOSIS — Z794 Long term (current) use of insulin: Secondary | ICD-10-CM

## 2020-12-24 LAB — POCT GLYCOSYLATED HEMOGLOBIN (HGB A1C): Hemoglobin A1C: 7.7 % — AB (ref 4.0–5.6)

## 2020-12-24 LAB — T4, FREE: Free T4: 0.73 ng/dL (ref 0.60–1.60)

## 2020-12-24 NOTE — Progress Notes (Signed)
Subjective:    Patient ID: Alicia Medina, female    DOB: November 16, 1932, 87 y.o.   MRN: [id number]  HPI Pt returns for f/u of diabetes mellitus:  DM type: Insulin-requiring type 2 Dx'ed: 1607 Complications: CAD, PN, and stage 3 CRI.  Therapy: insulin since 2013, and Ozempic.   GDM: never.  DKA: never.   Severe hypoglycemia: never.   Pancreatitis: never.  Pancreatic imaging: never Other: she declines multiple daily injections; She did not tolerate metformin (diarrhea); she changed lantus to levemir, then NPH, based on the pattern of cbg's.   Interval history: she brings a record of her cbg's which I have reviewed today.  cbg are all in the mid-100's.  There is no trend throughout the day. Pt says she seldom misses the insulin.   She had thyroidect in 2008 (benign pathol; she has been on synthroid since then; she requests brand name synthroid).   Past Medical History:  Diagnosis Date  . Arthritis   . CAD (coronary artery disease) CARDIOLOGIST-  DR Martinique   08-28-2011 Inferior STEMI with BMS to the RCA, complicated by cardiogenic shock, VF, VDRF , temporary CHB and abrupt reocclusion of the RCA stent treated with a second BMS to the RCA. Has residual LAD disease. EF is 50%  . Diverticulosis of colon   . Hemorrhoids, internal, with bleeding   . Hyperlipidemia   . Hypertension   . Hypothyroidism, postsurgical   . S/p bare metal coronary artery stent    2013  . Type 2 diabetes mellitus ([place])   . Wears hearing aid    both ears    Past Surgical History:  Procedure Laterality Date  . CARDIOVASCULAR STRESS TEST  10-03-2011  dr Martinique   normal perfusion / no ischemia/ ef 77%  . CARPAL TUNNEL RELEASE Right 01/25/2013   Procedure: CARPAL TUNNEL RELEASE;  Surgeon: Wynonia Sours, MD;  Location: [place];  Service: Orthopedics;  Laterality: Right;  . CARPAL TUNNEL RELEASE Left 10/25/2013   Procedure: LEFT CARPAL TUNNEL RELEASE;  Surgeon: Wynonia Sours, MD;  Location: [place];  Service: Orthopedics;  Laterality: Left;  . CATARACT EXTRACTION W/ INTRAOCULAR LENS  IMPLANT, BILATERAL  2007  . COLONOSCOPY  04-27-2003  . CORONARY ANGIOGRAM Left 08/28/2011   Procedure: CORONARY ANGIOGRAM;  Surgeon: Peter M Martinique, MD;  Location: [facility] CATH LAB;  Service: Cardiovascular;  Laterality: Left;  . CORONARY ANGIOPLASTY WITH STENT PLACEMENT  08-28-2011  dr Martinique   BMS to [facility] with abrupt reocclusion treated with second BMS/  LAD ostial 90%, proximal50-60%, first diagonal 80-90%  . CORONARY STENT INTERVENTION N/A 10/11/2018   Procedure: CORONARY STENT INTERVENTION;  Surgeon: Martinique, Peter M, MD;  Location: [place] CV LAB;  Service: Cardiovascular;  Laterality: N/A;  . LEFT HEART CATH AND CORONARY ANGIOGRAPHY N/A 10/11/2018   Procedure: LEFT HEART CATH AND CORONARY ANGIOGRAPHY;  Surgeon: Martinique, Peter M, MD;  Location: [place] CV LAB;  Service: Cardiovascular;  Laterality: N/A;  . LEFT HEART CATHETERIZATION WITH CORONARY ANGIOGRAM N/A 08/28/2011   Procedure: LEFT HEART CATHETERIZATION WITH CORONARY ANGIOGRAM;  Surgeon: Peter M Martinique, MD;  Location: [facility] CATH LAB;  Service: Cardiovascular;  Laterality: N/A;  . MASS EXCISION Right 08/06/2020   Procedure: EXCISION MASS AND DEBRIDEMENT PROXIMAL INTERPHALANGEAL JOINT RIGHT SMALL FINGER;  Surgeon: Daryll Brod, MD;  Location: [place];  Service: Orthopedics;  Laterality: Right;  Bier block  . PERCUTANEOUS CORONARY STENT INTERVENTION (PCI-S) Left 08/28/2011  Procedure: PERCUTANEOUS CORONARY STENT INTERVENTION (PCI-S);  Surgeon: Peter M Martinique, MD;  Location: [facility] CATH LAB;  Service: Cardiovascular;  Laterality: Left;  . TOTAL THYROIDECTOMY Bilateral 05-02-2007   multinodular goiter  . TRANSANAL HEMORRHOIDAL DEARTERIALIZATION N/A 03/28/2014   Procedure: TRANSANAL HEMORRHOIDAL DEARTERIALIZATION;  Surgeon: Leighton Ruff, MD;  Location: [facility];  Service: General;  Laterality: N/A;  . TRANSTHORACIC  ECHOCARDIOGRAM  08-30-2011   mild LVH/ ef 94-49%/  grade I diastolic dysfunction  . TRIGGER FINGER RELEASE Right 01/25/2013   Procedure: RELEASE TRIGGER FINGER/A-1 PULLEY RIGHT INDEX FINGER;  Surgeon: Wynonia Sours, MD;  Location: [place];  Service: Orthopedics;  Laterality: Right;  . TRIGGER FINGER RELEASE Left 10/25/2013   Procedure: RELEASE A-1 PULLEY LEFT MIDDLE FINGER;  Surgeon: Wynonia Sours, MD;  Location: [place];  Service: Orthopedics;  Laterality: Left;  . TRIGGER FINGER RELEASE Left 12/04/2014   Procedure: RELEASE TRIGGER FINGER/A-1 PULLEY LEFT RING FINGER;  Surgeon: Daryll Brod, MD;  Location: [place];  Service: Orthopedics;  Laterality: Left;  . TRIGGER FINGER RELEASE Right 03/30/2017   Procedure: RELEASE TRIGGER FINGER/A-1 PULLEY RIGHT RING FINGER AND RIGHT SMALL FINGER;  Surgeon: Daryll Brod, MD;  Location: [place];  Service: Orthopedics;  Laterality: Right;    Social History   Socioeconomic History  . Marital status: Married    Spouse name: Not on file  . Number of children: Not on file  . Years of education: Not on file  . Highest education level: Not on file  Occupational History  . Not on file  Tobacco Use  . Smoking status: Never Smoker  . Smokeless tobacco: Never Used  Vaping Use  . Vaping Use: Never used  Substance and Sexual Activity  . Alcohol use: Yes    Alcohol/week: 1.0 standard drink    Types: 1 Glasses of wine per week    Comment: social  . Drug use: No  . Sexual activity: Not on file  Other Topics Concern  . Not on file  Social History Narrative   Lives with husband.   Has 2 sons- both healthy.   Younger son lives in [place], [place] in order son lives in [place].   Social Determinants of Health   Financial Resource Strain: Not on file  Food Insecurity: Not on file  Transportation Needs: Not on file  Physical Activity: Not on file  Stress: Not on file  Social Connections: Not on  file  Intimate Partner Violence: Not on file    Current Outpatient Medications on File Prior to Visit  Medication Sig Dispense Refill  . acetaminophen (TYLENOL) 500 MG tablet Take 1,000 mg by mouth at bedtime.    . Ascorbic Acid (VITAMIN C) 1000 MG tablet Take 1,000 mg by mouth every evening.    . Aspirin Buf,CaCarb-MgCarb-MgO, 81 MG TABS aspirin 81 mg tablet  Take by oral route.    Marland Kitchen aspirin EC 81 MG tablet Take 81 mg by mouth daily.    . Biotin w/ Vitamins C & E (HAIR/SKIN/NAILS PO) Take 1 tablet by mouth 2 (two) times daily.    Marland Kitchen bismuth subsalicylate (PEPTO BISMOL) 262 MG chewable tablet Chew 131-262 mg by mouth as directed.    . calcium carbonate (OSCAL) 1500 (600 Ca) MG TABS tablet Take 600 mg of elemental calcium by mouth once a week.     . Cholecalciferol (VITAMIN D-3) 125 MCG (5000 UT) TABS Take 5,000 Units by mouth daily.    Marland Kitchen  Chromium Picolinate 1000 MCG TABS Take 1,000 mcg by mouth daily.    . Coenzyme Q10 200 MG TABS Take 200 mg by mouth daily.     . COLLAGEN PO Take 1,000 mg by mouth 2 (two) times daily.    Marland Kitchen ESTRACE VAGINAL 0.1 MG/GM vaginal cream     . folic acid (FOLVITE) 283 MCG tablet Take 800 mcg by mouth daily.    Marland Kitchen glucose blood (TRUE METRIX BLOOD GLUCOSE TEST) test strip 1 each by Other route 2 (two) times daily. And lancets 2/day 200 each 3  . Insulin NPH, Human,, Isophane, (NOVOLIN N FLEXPEN) 100 UNIT/ML Kiwkpen Inject 15 Units into the skin every morning. 15 mL 2  . loperamide (IMODIUM A-D) 2 MG tablet Take 1-2 mg by mouth as directed.    . loratadine (CLARITIN) 10 MG tablet Take 10 mg by mouth at bedtime.    Marland Kitchen losartan (COZAAR) 25 MG tablet TAKE 1 TABLET (25 MG TOTAL) BY MOUTH EVERY MORNING. 90 tablet 3  . Magnesium 250 MG TABS Take 250 mg by mouth daily.    . metoprolol tartrate (LOPRESSOR) 50 MG tablet TAKE 1 TABLET BY MOUTH 2 TIMES DAILY. 180 tablet 3  . Multiple Vitamin (MULTIVITAMIN) tablet Take 1 tablet by mouth once a week.     . nitrofurantoin,  macrocrystal-monohydrate, (MACROBID) 100 MG capsule Take 100 mg by mouth 2 (two) times daily.    . Psyllium (METAMUCIL PO) Take 1 capsule by mouth daily.    . rosuvastatin (CRESTOR) 20 MG tablet TAKE 1 TABLET (20 MG TOTAL) BY MOUTH DAILY. 90 tablet 3  . Semaglutide,0.25 or 0.5MG /DOS, (OZEMPIC, 0.25 OR 0.5 MG/DOSE,) 2 MG/1.5ML SOPN Inject 0.5 mg into the skin once a week. 4.5 mL 3  . sulfamethoxazole-trimethoprim (BACTRIM DS) 800-160 MG tablet Take 1 tablet by mouth 2 (two) times daily.    Marland Kitchen SYNTHROID 175 MCG tablet TAKE 1 TABLET (175 MCG TOTAL) BY MOUTH DAILY BEFORE BREAKFAST. 90 tablet 1  . traMADol (ULTRAM) 50 MG tablet Take 1 tablet (50 mg total) by mouth every 6 (six) hours as needed. 20 tablet 0  . vitamin B-12 (CYANOCOBALAMIN) 500 MCG tablet Take 500 mcg by mouth daily.    Marland Kitchen VITAMIN E PO Take 500 mg by mouth 2 (two) times a week.      No current facility-administered medications on file prior to visit.    Allergies  Allergen Reactions  . Demeclocycline Other (See Comments)    Severe stomach cramps Severe stomach cramps Severe stomach cramps  . Lisinopril Cough  . Tetracyclines & Related Other (See Comments)    Severe stomach cramps    Family History  Problem Relation Age of Onset  . Stroke Mother   . Cancer Mother        breast  . Cancer Father        lung  . Stroke Father   . Diabetes Neg Hx     BP 130/64 (BP Location: Right Arm, Patient Position: Sitting, Cuff Size: Normal)   Pulse 73   Ht 5\' 3"  (1.6 m)   Wt 151 lb 9.6 oz (68.8 kg)   SpO2 97%   BMI 26.85 kg/m    Review of Systems She denies hypoglycemia/n/v/heartburn.      Objective:   Physical Exam VITAL SIGNS:  See vs page GENERAL: no distress Pulses: dorsalis pedis intact bilat.   MSK: no deformity of the feet CV: no leg edema Skin:  no ulcer on the feet.  normal  color and temp on the feet.   Neuro: sensation is intact to touch on the feet, but decreased from normal.     Lab Results  Component  Value Date   HGBA1C 7.7 (A) 12/24/2020       Assessment & Plan:  Insulin-requiring type 2 DM: uncontrolled.  I advised pt to increase Ozempic and reduce insulin, but she declines.   Hypothyroidism: recheck today.  Patient Instructions  Your blood pressure is high today.  Please see your primary care provider soon, to have it rechecked Please continue the same diabetes medications.   On this type of insulin schedule, you should eat meals on a regular schedule.  If a meal is missed or significantly delayed, your blood sugar could go low.  check your blood sugar twice a day.  vary the time of day when you check, between before the 3 meals, and at bedtime.  also check if you have symptoms of your blood sugar being too high or too low.  please keep a record of the readings and bring it to your next appointment here (or you can bring the meter itself).  You can write it on any piece of paper.  please call us sooner if your blood sugar goes below 70, or if you have a lot of readings over 200.  Thyroid blood tests are requested for you today.  We'll let you know about the results.   Please come back for a follow-up appointment in 3 months.

## 2020-12-24 NOTE — Patient Instructions (Addendum)
Your blood pressure is high today.  Please see your primary care provider soon, to have it rechecked Please continue the same diabetes medications.   On this type of insulin schedule, you should eat meals on a regular schedule.  If a meal is missed or significantly delayed, your blood sugar could go low.  check your blood sugar twice a day.  vary the time of day when you check, between before the 3 meals, and at bedtime.  also check if you have symptoms of your blood sugar being too high or too low.  please keep a record of the readings and bring it to your next appointment here (or you can bring the meter itself).  You can write it on any piece of paper.  please call us sooner if your blood sugar goes below 70, or if you have a lot of readings over 200.  Thyroid blood tests are requested for you today.  We'll let you know about the results.   Please come back for a follow-up appointment in 3 months.

## 2020-12-25 LAB — TSH: TSH: 60.64 u[IU]/mL — ABNORMAL HIGH (ref 0.35–4.50)

## 2020-12-26 DIAGNOSIS — E119 Type 2 diabetes mellitus without complications: Secondary | ICD-10-CM | POA: Diagnosis not present

## 2020-12-26 DIAGNOSIS — H524 Presbyopia: Secondary | ICD-10-CM | POA: Diagnosis not present

## 2020-12-26 DIAGNOSIS — Z961 Presence of intraocular lens: Secondary | ICD-10-CM | POA: Diagnosis not present

## 2021-02-06 ENCOUNTER — Other Ambulatory Visit: Payer: Self-pay | Admitting: Endocrinology

## 2021-02-06 DIAGNOSIS — E119 Type 2 diabetes mellitus without complications: Secondary | ICD-10-CM

## 2021-02-07 ENCOUNTER — Other Ambulatory Visit: Payer: Self-pay | Admitting: Obstetrics & Gynecology

## 2021-02-07 DIAGNOSIS — Z1231 Encounter for screening mammogram for malignant neoplasm of breast: Secondary | ICD-10-CM

## 2021-03-13 DIAGNOSIS — Z20822 Contact with and (suspected) exposure to covid-19: Secondary | ICD-10-CM | POA: Diagnosis not present

## 2021-03-21 DIAGNOSIS — N390 Urinary tract infection, site not specified: Secondary | ICD-10-CM | POA: Diagnosis not present

## 2021-03-21 DIAGNOSIS — N819 Female genital prolapse, unspecified: Secondary | ICD-10-CM | POA: Diagnosis not present

## 2021-03-26 ENCOUNTER — Other Ambulatory Visit: Payer: Self-pay

## 2021-03-26 ENCOUNTER — Ambulatory Visit (INDEPENDENT_AMBULATORY_CARE_PROVIDER_SITE_OTHER): Payer: Medicare Other | Admitting: Endocrinology

## 2021-03-26 VITALS — BP 140/74 | HR 106 | Ht 63.0 in | Wt 148.0 lb

## 2021-03-26 DIAGNOSIS — I2510 Atherosclerotic heart disease of native coronary artery without angina pectoris: Secondary | ICD-10-CM | POA: Diagnosis not present

## 2021-03-26 DIAGNOSIS — E1122 Type 2 diabetes mellitus with diabetic chronic kidney disease: Secondary | ICD-10-CM | POA: Diagnosis not present

## 2021-03-26 DIAGNOSIS — N1831 Chronic kidney disease, stage 3a: Secondary | ICD-10-CM

## 2021-03-26 DIAGNOSIS — Z794 Long term (current) use of insulin: Secondary | ICD-10-CM

## 2021-03-26 DIAGNOSIS — E0829 Diabetes mellitus due to underlying condition with other diabetic kidney complication: Secondary | ICD-10-CM

## 2021-03-26 DIAGNOSIS — E890 Postprocedural hypothyroidism: Secondary | ICD-10-CM

## 2021-03-26 DIAGNOSIS — R809 Proteinuria, unspecified: Secondary | ICD-10-CM

## 2021-03-26 LAB — POCT GLYCOSYLATED HEMOGLOBIN (HGB A1C): Hemoglobin A1C: 7.7 % — AB (ref 4.0–5.6)

## 2021-03-26 NOTE — Patient Instructions (Addendum)
Your heart rate is fast today.  Please see a primary care provider soon, to have it rechecked Please continue the same diabetes medications.   On this type of insulin schedule, you should eat meals on a regular schedule.  If a meal is missed or significantly delayed, your blood sugar could go low.  check your blood sugar twice a day.  vary the time of day when you check, between before the 3 meals, and at bedtime.  also check if you have symptoms of your blood sugar being too high or too low.  please keep a record of the readings and bring it to your next appointment here (or you can bring the meter itself).  You can write it on any piece of paper.  please call us sooner if your blood sugar goes below 70, or if you have a lot of readings over 200.   It is best to never miss the Synthroid.  However, if you do miss it, next best is to double up the next time.   Please come back for a follow-up appointment in 3 months.

## 2021-03-26 NOTE — Progress Notes (Signed)
Subjective:    Patient ID: Alicia Medina, female    DOB: 02-28-1933, 87 y.o.   MRN: [id number]  HPI Pt returns for f/u of diabetes mellitus:  DM type: Insulin-requiring type 2 Dx'ed: [id number] Complications: CAD, PN, and stage 3 CRI.  Therapy: insulin since 2013, and Ozempic GDM: never.  DKA: never.   Severe hypoglycemia: never.   Pancreatitis: never.  Pancreatic imaging: never Other: she declines multiple daily injections; She did not tolerate metformin (diarrhea); she changed lantus to levemir, then NPH, based on the pattern of cbg's.   Interval history: she brings a record of her cbg's which I have reviewed today.  cbg varies from 121-223  There is no trend throughout the day. Pt says she seldom misses the insulin.  She stopped Ozempic, due to diarrhea.   She had thyroidect in 2008 (benign pathol; she has been on synthroid since then; she requests brand name synthroid). She often misses synthroid.   Past Medical History:  Diagnosis Date   Arthritis    CAD (coronary artery disease) CARDIOLOGIST-  DR Martinique   08-28-2011 Inferior STEMI with BMS to the RCA, complicated by cardiogenic shock, VF, VDRF , temporary CHB and abrupt reocclusion of the RCA stent treated with a second BMS to the RCA. Has residual LAD disease. EF is 50%   Diverticulosis of colon    Hemorrhoids, internal, with bleeding    Hyperlipidemia    Hypertension    Hypothyroidism, postsurgical    S/p bare metal coronary artery stent    2013   Type 2 diabetes mellitus ([place])    Wears hearing aid    both ears    Past Surgical History:  Procedure Laterality Date   CARDIOVASCULAR STRESS TEST  10-03-2011  dr Martinique   normal perfusion / no ischemia/ ef 77%   CARPAL TUNNEL RELEASE Right 01/25/2013   Procedure: CARPAL TUNNEL RELEASE;  Surgeon: Wynonia Sours, MD;  Location: [place];  Service: Orthopedics;  Laterality: Right;   CARPAL TUNNEL RELEASE Left 10/25/2013   Procedure: LEFT CARPAL TUNNEL RELEASE;   Surgeon: Wynonia Sours, MD;  Location: [place];  Service: Orthopedics;  Laterality: Left;   CATARACT EXTRACTION W/ INTRAOCULAR LENS  IMPLANT, BILATERAL  2007   COLONOSCOPY  04-27-2003   CORONARY ANGIOGRAM Left 08/28/2011   Procedure: CORONARY ANGIOGRAM;  Surgeon: Peter M Martinique, MD;  Location: [facility] CATH LAB;  Service: Cardiovascular;  Laterality: Left;   CORONARY ANGIOPLASTY WITH STENT PLACEMENT  08-28-2011  dr Martinique   BMS to [facility] with abrupt reocclusion treated with second BMS/  LAD ostial 90%, proximal50-60%, first diagonal 80-90%   CORONARY STENT INTERVENTION N/A 10/11/2018   Procedure: CORONARY STENT INTERVENTION;  Surgeon: Martinique, Peter M, MD;  Location: [place] CV LAB;  Service: Cardiovascular;  Laterality: N/A;   LEFT HEART CATH AND CORONARY ANGIOGRAPHY N/A 10/11/2018   Procedure: LEFT HEART CATH AND CORONARY ANGIOGRAPHY;  Surgeon: Martinique, Peter M, MD;  Location: [place] CV LAB;  Service: Cardiovascular;  Laterality: N/A;   LEFT HEART CATHETERIZATION WITH CORONARY ANGIOGRAM N/A 08/28/2011   Procedure: LEFT HEART CATHETERIZATION WITH CORONARY ANGIOGRAM;  Surgeon: Peter M Martinique, MD;  Location: [facility] CATH LAB;  Service: Cardiovascular;  Laterality: N/A;   MASS EXCISION Right 08/06/2020   Procedure: EXCISION MASS AND DEBRIDEMENT PROXIMAL INTERPHALANGEAL JOINT RIGHT SMALL FINGER;  Surgeon: Daryll Brod, MD;  Location: [place];  Service: Orthopedics;  Laterality: Right;  Bier block   PERCUTANEOUS CORONARY  STENT INTERVENTION (PCI-S) Left 08/28/2011   Procedure: PERCUTANEOUS CORONARY STENT INTERVENTION (PCI-S);  Surgeon: Peter M Martinique, MD;  Location: [facility] CATH LAB;  Service: Cardiovascular;  Laterality: Left;   TOTAL THYROIDECTOMY Bilateral 05-02-2007   multinodular goiter   TRANSANAL HEMORRHOIDAL DEARTERIALIZATION N/A 03/28/2014   Procedure: TRANSANAL HEMORRHOIDAL DEARTERIALIZATION;  Surgeon: Leighton Ruff, MD;  Location: [facility];  Service: General;   Laterality: N/A;   TRANSTHORACIC ECHOCARDIOGRAM  08-30-2011   mild LVH/ ef [id number]  grade I diastolic dysfunction   TRIGGER FINGER RELEASE Right 01/25/2013   Procedure: RELEASE TRIGGER FINGER/A-1 PULLEY RIGHT INDEX FINGER;  Surgeon: Wynonia Sours, MD;  Location: [place];  Service: Orthopedics;  Laterality: Right;   TRIGGER FINGER RELEASE Left 10/25/2013   Procedure: RELEASE A-1 PULLEY LEFT MIDDLE FINGER;  Surgeon: Wynonia Sours, MD;  Location: [place];  Service: Orthopedics;  Laterality: Left;   TRIGGER FINGER RELEASE Left 12/04/2014   Procedure: RELEASE TRIGGER FINGER/A-1 PULLEY LEFT RING FINGER;  Surgeon: Daryll Brod, MD;  Location: [place];  Service: Orthopedics;  Laterality: Left;   TRIGGER FINGER RELEASE Right 03/30/2017   Procedure: RELEASE TRIGGER FINGER/A-1 PULLEY RIGHT RING FINGER AND RIGHT SMALL FINGER;  Surgeon: Daryll Brod, MD;  Location: [place];  Service: Orthopedics;  Laterality: Right;    Social History   Socioeconomic History   Marital status: Married    Spouse name: Not on file   Number of children: Not on file   Years of education: Not on file   Highest education level: Not on file  Occupational History   Not on file  Tobacco Use   Smoking status: Never   Smokeless tobacco: Never  Vaping Use   Vaping Use: Never used  Substance and Sexual Activity   Alcohol use: Yes    Alcohol/week: 1.0 standard drink    Types: 1 Glasses of wine per week    Comment: social   Drug use: No   Sexual activity: Not on file  Other Topics Concern   Not on file  Social History Narrative   Lives with husband.   Has 2 sons- both healthy.   Younger son lives in [place], [place] in order son lives in [place].   Social Determinants of Health   Financial Resource Strain: Not on file  Food Insecurity: Not on file  Transportation Needs: Not on file  Physical Activity: Not on file  Stress: Not on file  Social Connections: Not  on file  Intimate Partner Violence: Not on file    Current Outpatient Medications on File Prior to Visit  Medication Sig Dispense Refill   acetaminophen (TYLENOL) 500 MG tablet Take 1,000 mg by mouth at bedtime.     Ascorbic Acid (VITAMIN C) 1000 MG tablet Take 1,000 mg by mouth every evening.     Aspirin Buf,CaCarb-MgCarb-MgO, 81 MG TABS aspirin 81 mg tablet  Take by oral route.     aspirin EC 81 MG tablet Take 81 mg by mouth daily.     Biotin w/ Vitamins C & E (HAIR/SKIN/NAILS PO) Take 1 tablet by mouth 2 (two) times daily.     bismuth subsalicylate (PEPTO BISMOL) 262 MG chewable tablet Chew 131-262 mg by mouth as directed.     calcium carbonate (OSCAL) 1500 (600 Ca) MG TABS tablet Take 600 mg of elemental calcium by mouth once a week.      Cholecalciferol (VITAMIN D-3) 125 MCG (5000 UT) TABS Take 5,000 Units  by mouth daily.     Chromium Picolinate 1000 MCG TABS Take 1,000 mcg by mouth daily.     Coenzyme Q10 200 MG TABS Take 200 mg by mouth daily.      COLLAGEN PO Take 1,000 mg by mouth 2 (two) times daily.     ESTRACE VAGINAL 0.1 MG/GM vaginal cream      folic acid (FOLVITE) Q000111Q MCG tablet Take 800 mcg by mouth daily.     glucose blood (TRUE METRIX BLOOD GLUCOSE TEST) test strip 1 each by Other route 2 (two) times daily. And lancets 2/day 200 each 3   loperamide (IMODIUM A-D) 2 MG tablet Take 1-2 mg by mouth as directed.     loratadine (CLARITIN) 10 MG tablet Take 10 mg by mouth at bedtime.     losartan (COZAAR) 25 MG tablet TAKE 1 TABLET (25 MG TOTAL) BY MOUTH EVERY MORNING. 90 tablet 3   Magnesium 250 MG TABS Take 250 mg by mouth daily.     metoprolol tartrate (LOPRESSOR) 50 MG tablet TAKE 1 TABLET BY MOUTH 2 TIMES DAILY. 180 tablet 3   Multiple Vitamin (MULTIVITAMIN) tablet Take 1 tablet by mouth once a week.      nitrofurantoin, macrocrystal-monohydrate, (MACROBID) 100 MG capsule Take 100 mg by mouth 2 (two) times daily.     NOVOLIN N FLEXPEN 100 UNIT/ML Kiwkpen INJECT 35 UNITS  SUBCUTANEOUSLY IN THE MORNING 15 mL 0   Psyllium (METAMUCIL PO) Take 1 capsule by mouth daily.     rosuvastatin (CRESTOR) 20 MG tablet TAKE 1 TABLET (20 MG TOTAL) BY MOUTH DAILY. 90 tablet 3   sulfamethoxazole-trimethoprim (BACTRIM DS) 800-160 MG tablet Take 1 tablet by mouth 2 (two) times daily.     SYNTHROID 175 MCG tablet TAKE 1 TABLET (175 MCG TOTAL) BY MOUTH DAILY BEFORE BREAKFAST. 90 tablet 1   traMADol (ULTRAM) 50 MG tablet Take 1 tablet (50 mg total) by mouth every 6 (six) hours as needed. 20 tablet 0   TRULICITY 1.5 0000000 SOPN Inject 1.5 mg into the skin once a week.     vitamin B-12 (CYANOCOBALAMIN) 500 MCG tablet Take 500 mcg by mouth daily.     VITAMIN E PO Take 500 mg by mouth 2 (two) times a week.      Semaglutide,0.25 or 0.'5MG'$ /DOS, (OZEMPIC, 0.25 OR 0.5 MG/DOSE,) 2 MG/1.5ML SOPN Inject 0.5 mg into the skin once a week. (Patient not taking: Reported on 03/26/2021) 4.5 mL 3   No current facility-administered medications on file prior to visit.    Allergies  Allergen Reactions   Demeclocycline Other (See Comments)    Severe stomach cramps Severe stomach cramps Severe stomach cramps   Lisinopril Cough   Tetracyclines & Related Other (See Comments)    Severe stomach cramps    Family History  Problem Relation Age of Onset   Stroke Mother    Cancer Mother        breast   Cancer Father        lung   Stroke Father    Diabetes Neg Hx     BP 140/74 (BP Location: Right Arm, Patient Position: Sitting, Cuff Size: Normal)   Pulse (!) 106   Ht '5\' 3"'$  (1.6 m)   Wt 148 lb (67.1 kg)   SpO2 97%   BMI 26.22 kg/m    Review of Systems     Objective:   Physical Exam Pulses: dorsalis pedis intact bilat.   MSK: no deformity of the feet CV: trace  bilat leg edema Skin:  no ulcer on the feet.  normal color and temp on the feet. Neuro: sensation is intact to touch on the feet   Lab Results  Component Value Date   HGBA1C 7.7 (A) 03/26/2021   Lab Results  Component Value  Date   TSH 60.64 (H) 12/24/2020      Assessment & Plan:  Insulin-requiring type 2 DM: this is the best control this pt should aim for, given this regimen, which does match insulin to her changing needs throughout the day Diarrhea, due to Ozempic Hypothyroidism: therapy limited by noncompliance.   Patient Instructions  Your heart rate is fast today.  Please see a primary care provider soon, to have it rechecked Please continue the same diabetes medications.   On this type of insulin schedule, you should eat meals on a regular schedule.  If a meal is missed or significantly delayed, your blood sugar could go low.  check your blood sugar twice a day.  vary the time of day when you check, between before the 3 meals, and at bedtime.  also check if you have symptoms of your blood sugar being too high or too low.  please keep a record of the readings and bring it to your next appointment here (or you can bring the meter itself).  You can write it on any piece of paper.  please call us sooner if your blood sugar goes below 70, or if you have a lot of readings over 200.   It is best to never miss the Synthroid.  However, if you do miss it, next best is to double up the next time.   Please come back for a follow-up appointment in 3 months.

## 2021-03-27 DIAGNOSIS — Z20822 Contact with and (suspected) exposure to covid-19: Secondary | ICD-10-CM | POA: Diagnosis not present

## 2021-04-03 ENCOUNTER — Other Ambulatory Visit: Payer: Self-pay

## 2021-04-03 ENCOUNTER — Ambulatory Visit
Admission: RE | Admit: 2021-04-03 | Discharge: 2021-04-03 | Disposition: A | Discharge status: Home / Self Care | Payer: Medicare Other | Source: Ambulatory Visit | Attending: Obstetrics & Gynecology | Admitting: Obstetrics & Gynecology

## 2021-04-03 DIAGNOSIS — Z1231 Encounter for screening mammogram for malignant neoplasm of breast: Secondary | ICD-10-CM | POA: Diagnosis not present

## 2021-04-24 ENCOUNTER — Other Ambulatory Visit: Payer: Self-pay | Admitting: Endocrinology

## 2021-04-26 DIAGNOSIS — Z20822 Contact with and (suspected) exposure to covid-19: Secondary | ICD-10-CM | POA: Diagnosis not present

## 2021-04-30 ENCOUNTER — Telehealth: Payer: Self-pay | Admitting: Endocrinology

## 2021-04-30 ENCOUNTER — Other Ambulatory Visit: Payer: Self-pay | Admitting: Endocrinology

## 2021-04-30 NOTE — Telephone Encounter (Signed)
MEDICATION:  TRULICITY 1.5 QM/5.7QI SOPN  PHARMACY:   [facility] [place] Phone:  [phone]  Fax:  [phone]      HAS THE PATIENT CONTACTED THEIR PHARMACY?  Yes-PHARM told Patient that Dr. Loanne Drilling told PHARM Patient is taking Ozempic not Trulicity (Patient states she has not and will not take Ozempic) therefore, RX request for Trulicity was denied by [facility]  IS THIS A 90 DAY SUPPLY : If possible-Patient prefers at least 90 day supply  IS PATIENT OUT OF MEDICATION: Yes-as of last week  IF NOT; HOW MUCH IS LEFT: 0  LAST APPOINTMENT DATE: @8 /31/2022  NEXT APPOINTMENT DATE:@12 /01/2021  DO WE HAVE YOUR PERMISSION TO LEAVE A DETAILED MESSAGE?: Yes  OTHER COMMENTS:    **Let patient know to contact pharmacy at the end of the day to make sure medication is ready. **  ** Please notify patient to allow 48-72 hours to process**  **Encourage patient to contact the pharmacy for refills or they can request refills through [facility]**

## 2021-05-01 NOTE — Telephone Encounter (Signed)
Rx sent 

## 2021-05-07 ENCOUNTER — Other Ambulatory Visit: Payer: Self-pay | Admitting: Endocrinology

## 2021-05-21 DIAGNOSIS — N819 Female genital prolapse, unspecified: Secondary | ICD-10-CM | POA: Diagnosis not present

## 2021-05-21 DIAGNOSIS — Z6824 Body mass index (BMI) 24.0-24.9, adult: Secondary | ICD-10-CM | POA: Diagnosis not present

## 2021-05-21 DIAGNOSIS — Z01419 Encounter for gynecological examination (general) (routine) without abnormal findings: Secondary | ICD-10-CM | POA: Diagnosis not present

## 2021-05-21 DIAGNOSIS — Z124 Encounter for screening for malignant neoplasm of cervix: Secondary | ICD-10-CM | POA: Diagnosis not present

## 2021-05-21 DIAGNOSIS — Z779 Other contact with and (suspected) exposures hazardous to health: Secondary | ICD-10-CM | POA: Diagnosis not present

## 2021-05-21 DIAGNOSIS — N841 Polyp of cervix uteri: Secondary | ICD-10-CM | POA: Diagnosis not present

## 2021-05-27 ENCOUNTER — Telehealth: Payer: Self-pay | Admitting: Endocrinology

## 2021-05-27 ENCOUNTER — Other Ambulatory Visit: Payer: Self-pay

## 2021-05-27 DIAGNOSIS — Z20822 Contact with and (suspected) exposure to covid-19: Secondary | ICD-10-CM | POA: Diagnosis not present

## 2021-05-27 DIAGNOSIS — E1122 Type 2 diabetes mellitus with diabetic chronic kidney disease: Secondary | ICD-10-CM

## 2021-05-27 DIAGNOSIS — E119 Type 2 diabetes mellitus without complications: Secondary | ICD-10-CM

## 2021-05-27 DIAGNOSIS — Z794 Long term (current) use of insulin: Secondary | ICD-10-CM

## 2021-05-27 MED ORDER — TRULICITY 1.5 MG/0.5ML ~~LOC~~ SOAJ
SUBCUTANEOUS | 0 refills | Status: DC
Start: 2021-05-27 — End: 2021-07-02

## 2021-05-27 NOTE — Telephone Encounter (Signed)
MEDICATION: Trulicity   PHARMACY:  [facility] on [address]  HAS THE PATIENT CONTACTED THEIR PHARMACY?  Yes, no refills left  IS THIS A 90 DAY SUPPLY : yes  IS PATIENT OUT OF MEDICATION: yes  IF NOT; HOW MUCH IS LEFT:   LAST APPOINTMENT DATE: @10 /06/2021  NEXT APPOINTMENT DATE:@12 /01/2021  DO WE HAVE YOUR PERMISSION TO LEAVE A DETAILED MESSAGE?: yes  OTHER COMMENTS:    **Let patient know to contact pharmacy at the end of the day to make sure medication is ready. **  ** Please notify patient to allow 48-72 hours to process**  **Encourage patient to contact the pharmacy for refills or they can request refills through [facility]**

## 2021-05-27 NOTE — Telephone Encounter (Signed)
Patient's Trulicity has now been sent into [facility] on [address]

## 2021-05-29 DIAGNOSIS — N819 Female genital prolapse, unspecified: Secondary | ICD-10-CM | POA: Diagnosis not present

## 2021-06-13 DIAGNOSIS — Z23 Encounter for immunization: Secondary | ICD-10-CM | POA: Diagnosis not present

## 2021-07-02 ENCOUNTER — Other Ambulatory Visit: Payer: Self-pay

## 2021-07-02 ENCOUNTER — Ambulatory Visit (INDEPENDENT_AMBULATORY_CARE_PROVIDER_SITE_OTHER): Payer: Medicare Other | Admitting: Endocrinology

## 2021-07-02 VITALS — BP 120/60 | HR 97 | Ht 63.0 in | Wt 142.2 lb

## 2021-07-02 DIAGNOSIS — E119 Type 2 diabetes mellitus without complications: Secondary | ICD-10-CM

## 2021-07-02 DIAGNOSIS — N1831 Chronic kidney disease, stage 3a: Secondary | ICD-10-CM | POA: Diagnosis not present

## 2021-07-02 DIAGNOSIS — Z794 Long term (current) use of insulin: Secondary | ICD-10-CM | POA: Diagnosis not present

## 2021-07-02 DIAGNOSIS — I2510 Atherosclerotic heart disease of native coronary artery without angina pectoris: Secondary | ICD-10-CM

## 2021-07-02 DIAGNOSIS — R200 Anesthesia of skin: Secondary | ICD-10-CM | POA: Diagnosis not present

## 2021-07-02 DIAGNOSIS — E1122 Type 2 diabetes mellitus with diabetic chronic kidney disease: Secondary | ICD-10-CM | POA: Diagnosis not present

## 2021-07-02 LAB — BASIC METABOLIC PANEL
BUN: 15 mg/dL (ref 6–23)
CO2: 30 mEq/L (ref 19–32)
Calcium: 9.7 mg/dL (ref 8.4–10.5)
Chloride: 99 mEq/L (ref 96–112)
Creatinine, Ser: 1.03 mg/dL (ref 0.40–1.20)
GFR: 49.25 mL/min — ABNORMAL LOW (ref >60.00)
Glucose, Bld: 250 mg/dL — ABNORMAL HIGH (ref 70–99)
Potassium: 4.1 mEq/L (ref 3.5–5.1)
Sodium: 136 mEq/L (ref 135–145)

## 2021-07-02 LAB — TSH: TSH: 10.52 u[IU]/mL — ABNORMAL HIGH (ref 0.35–5.50)

## 2021-07-02 LAB — POCT GLYCOSYLATED HEMOGLOBIN (HGB A1C): Hemoglobin A1C: 8.7 % — AB (ref 4.0–5.6)

## 2021-07-02 LAB — VITAMIN B12: Vitamin B-12: 791 pg/mL (ref 211–911)

## 2021-07-02 MED ORDER — TRULICITY 1.5 MG/0.5ML ~~LOC~~ SOAJ: 1.5000 mg | SUBCUTANEOUS | 3 refills | Status: DC

## 2021-07-02 NOTE — Patient Instructions (Addendum)
Please continue the same diabetes medications.  It is important to not miss medications On this type of insulin schedule, you should eat meals on a regular schedule.  If a meal is missed or significantly delayed, your blood sugar could go low.  check your blood sugar twice a day.  vary the time of day when you check, between before the 3 meals, and at bedtime.  also check if you have symptoms of your blood sugar being too high or too low.  please keep a record of the readings and bring it to your next appointment here (or you can bring the meter itself).  You can write it on any piece of paper.  please call us sooner if your blood sugar goes below 70, or if you have a lot of readings over 200.   Blood tests are requested for you today.  We'll let you know about the results.   Please come back for a follow-up appointment in 3 months.

## 2021-07-02 NOTE — Progress Notes (Signed)
Subjective:    Patient ID: Alicia Medina, female    DOB: Jun 21, 1933, 87 y.o.   MRN: [id number]  HPI Pt returns for f/u of diabetes mellitus:  DM type: Insulin-requiring type 2.   Dx'ed: 4268 Complications: CAD, PN, and stage 3 CRI.  Therapy: insulin since 3419, and Trulicity.   GDM: never.  DKA: never.   Severe hypoglycemia: never.   Pancreatitis: never.  Pancreatic imaging: never Other: she declines multiple daily injections; She did not tolerate metformin or Ozempic (diarrhea with both); she changed lantus to levemir, then NPH, based on the pattern of cbg's.     Interval history: Pt says she sometimes misses meds.  She has not recently checked cbg. She had thyroidect in 2008 (benign pathol; she has been on synthroid since then; she requests brand name synthroid). She often misses synthroid.   Past Medical History:  Diagnosis Date   Arthritis    CAD (coronary artery disease) CARDIOLOGIST-  DR Martinique   08-28-2011 Inferior STEMI with BMS to the RCA, complicated by cardiogenic shock, VF, VDRF , temporary CHB and abrupt reocclusion of the RCA stent treated with a second BMS to the RCA. Has residual LAD disease. EF is 50%   Diverticulosis of colon    Hemorrhoids, internal, with bleeding    Hyperlipidemia    Hypertension    Hypothyroidism, postsurgical    S/p bare metal coronary artery stent    2013   Type 2 diabetes mellitus ([place])    Wears hearing aid    both ears    Past Surgical History:  Procedure Laterality Date   CARDIOVASCULAR STRESS TEST  10-03-2011  dr Martinique   normal perfusion / no ischemia/ ef 77%   CARPAL TUNNEL RELEASE Right 01/25/2013   Procedure: CARPAL TUNNEL RELEASE;  Surgeon: Wynonia Sours, MD;  Location: [place];  Service: Orthopedics;  Laterality: Right;   CARPAL TUNNEL RELEASE Left 10/25/2013   Procedure: LEFT CARPAL TUNNEL RELEASE;  Surgeon: Wynonia Sours, MD;  Location: [place];  Service: Orthopedics;  Laterality: Left;    CATARACT EXTRACTION W/ INTRAOCULAR LENS  IMPLANT, BILATERAL  2007   COLONOSCOPY  04-27-2003   CORONARY ANGIOGRAM Left 08/28/2011   Procedure: CORONARY ANGIOGRAM;  Surgeon: Peter M Martinique, MD;  Location: [facility] CATH LAB;  Service: Cardiovascular;  Laterality: Left;   CORONARY ANGIOPLASTY WITH STENT PLACEMENT  08-28-2011  dr Martinique   BMS to [facility] with abrupt reocclusion treated with second BMS/  LAD ostial 90%, proximal50-60%, first diagonal 80-90%   CORONARY STENT INTERVENTION N/A 10/11/2018   Procedure: CORONARY STENT INTERVENTION;  Surgeon: Martinique, Peter M, MD;  Location: [place] CV LAB;  Service: Cardiovascular;  Laterality: N/A;   LEFT HEART CATH AND CORONARY ANGIOGRAPHY N/A 10/11/2018   Procedure: LEFT HEART CATH AND CORONARY ANGIOGRAPHY;  Surgeon: Martinique, Peter M, MD;  Location: [place] CV LAB;  Service: Cardiovascular;  Laterality: N/A;   LEFT HEART CATHETERIZATION WITH CORONARY ANGIOGRAM N/A 08/28/2011   Procedure: LEFT HEART CATHETERIZATION WITH CORONARY ANGIOGRAM;  Surgeon: Peter M Martinique, MD;  Location: [facility] CATH LAB;  Service: Cardiovascular;  Laterality: N/A;   MASS EXCISION Right 08/06/2020   Procedure: EXCISION MASS AND DEBRIDEMENT PROXIMAL INTERPHALANGEAL JOINT RIGHT SMALL FINGER;  Surgeon: Daryll Brod, MD;  Location: [place];  Service: Orthopedics;  Laterality: Right;  Bier block   PERCUTANEOUS CORONARY STENT INTERVENTION (PCI-S) Left 08/28/2011   Procedure: PERCUTANEOUS CORONARY STENT INTERVENTION (PCI-S);  Surgeon: Peter M Martinique,  MD;  Location: [place] CATH LAB;  Service: Cardiovascular;  Laterality: Left;   TOTAL THYROIDECTOMY Bilateral 05-02-2007   multinodular goiter   TRANSANAL HEMORRHOIDAL DEARTERIALIZATION N/A 03/28/2014   Procedure: TRANSANAL HEMORRHOIDAL DEARTERIALIZATION;  Surgeon: Leighton Ruff, MD;  Location: [facility];  Service: General;  Laterality: N/A;   TRANSTHORACIC ECHOCARDIOGRAM  08-30-2011   mild LVH/ ef 76-19%/  grade I diastolic  dysfunction   TRIGGER FINGER RELEASE Right 01/25/2013   Procedure: RELEASE TRIGGER FINGER/A-1 PULLEY RIGHT INDEX FINGER;  Surgeon: Wynonia Sours, MD;  Location: [place];  Service: Orthopedics;  Laterality: Right;   TRIGGER FINGER RELEASE Left 10/25/2013   Procedure: RELEASE A-1 PULLEY LEFT MIDDLE FINGER;  Surgeon: Wynonia Sours, MD;  Location: [place];  Service: Orthopedics;  Laterality: Left;   TRIGGER FINGER RELEASE Left 12/04/2014   Procedure: RELEASE TRIGGER FINGER/A-1 PULLEY LEFT RING FINGER;  Surgeon: Daryll Brod, MD;  Location: [place];  Service: Orthopedics;  Laterality: Left;   TRIGGER FINGER RELEASE Right 03/30/2017   Procedure: RELEASE TRIGGER FINGER/A-1 PULLEY RIGHT RING FINGER AND RIGHT SMALL FINGER;  Surgeon: Daryll Brod, MD;  Location: [place];  Service: Orthopedics;  Laterality: Right;    Social History   Socioeconomic History   Marital status: Married    Spouse name: Not on file   Number of children: Not on file   Years of education: Not on file   Highest education level: Not on file  Occupational History   Not on file  Tobacco Use   Smoking status: Never   Smokeless tobacco: Never  Vaping Use   Vaping Use: Never used  Substance and Sexual Activity   Alcohol use: Yes    Alcohol/week: 1.0 standard drink    Types: 1 Glasses of wine per week    Comment: social   Drug use: No   Sexual activity: Not on file  Other Topics Concern   Not on file  Social History Narrative   Lives with husband.   Has 2 sons- both healthy.   Younger son lives in [place], [place] in order son lives in [place].   Social Determinants of Health   Financial Resource Strain: Not on file  Food Insecurity: Not on file  Transportation Needs: Not on file  Physical Activity: Not on file  Stress: Not on file  Social Connections: Not on file  Intimate Partner Violence: Not on file    Current Outpatient Medications on File Prior to  Visit  Medication Sig Dispense Refill   acetaminophen (TYLENOL) 500 MG tablet Take 1,000 mg by mouth at bedtime.     Ascorbic Acid (VITAMIN C) 1000 MG tablet Take 1,000 mg by mouth every evening.     Aspirin Buf,CaCarb-MgCarb-MgO, 81 MG TABS aspirin 81 mg tablet  Take by oral route.     aspirin EC 81 MG tablet Take 81 mg by mouth daily.     Biotin w/ Vitamins C & E (HAIR/SKIN/NAILS PO) Take 1 tablet by mouth 2 (two) times daily.     bismuth subsalicylate (PEPTO BISMOL) 262 MG chewable tablet Chew 131-262 mg by mouth as directed.     calcium carbonate (OSCAL) 1500 (600 Ca) MG TABS tablet Take 600 mg of elemental calcium by mouth once a week.      Cholecalciferol (VITAMIN D-3) 125 MCG (5000 UT) TABS Take 5,000 Units by mouth daily.     Chromium Picolinate 1000 MCG TABS Take 1,000 mcg by mouth daily.  Coenzyme Q10 200 MG TABS Take 200 mg by mouth daily.      COLLAGEN PO Take 1,000 mg by mouth 2 (two) times daily.     ESTRACE VAGINAL 0.1 MG/GM vaginal cream      folic acid (FOLVITE) 127 MCG tablet Take 800 mcg by mouth daily.     glucose blood (TRUE METRIX BLOOD GLUCOSE TEST) test strip 1 each by Other route 2 (two) times daily. And lancets 2/day 200 each 3   loperamide (IMODIUM A-D) 2 MG tablet Take 1-2 mg by mouth as directed.     loratadine (CLARITIN) 10 MG tablet Take 10 mg by mouth at bedtime.     losartan (COZAAR) 25 MG tablet TAKE 1 TABLET (25 MG TOTAL) BY MOUTH EVERY MORNING. 90 tablet 3   Magnesium 250 MG TABS Take 250 mg by mouth daily.     metoprolol tartrate (LOPRESSOR) 50 MG tablet TAKE 1 TABLET BY MOUTH 2 TIMES DAILY. 180 tablet 3   Multiple Vitamin (MULTIVITAMIN) tablet Take 1 tablet by mouth once a week.      nitrofurantoin, macrocrystal-monohydrate, (MACROBID) 100 MG capsule Take 100 mg by mouth 2 (two) times daily.     NOVOLIN N FLEXPEN 100 UNIT/ML Kiwkpen INJECT 35 UNITS SUBCUTANEOUSLY IN THE MORNING 15 mL 0   Psyllium (METAMUCIL PO) Take 1 capsule by mouth daily.      rosuvastatin (CRESTOR) 20 MG tablet TAKE 1 TABLET (20 MG TOTAL) BY MOUTH DAILY. 90 tablet 3   sulfamethoxazole-trimethoprim (BACTRIM DS) 800-160 MG tablet Take 1 tablet by mouth 2 (two) times daily.     SYNTHROID 175 MCG tablet TAKE 1 TABLET (175 MCG TOTAL) BY MOUTH DAILY BEFORE BREAKFAST. 90 tablet 1   traMADol (ULTRAM) 50 MG tablet Take 1 tablet (50 mg total) by mouth every 6 (six) hours as needed. 20 tablet 0   vitamin B-12 (CYANOCOBALAMIN) 500 MCG tablet Take 500 mcg by mouth daily.     VITAMIN E PO Take 500 mg by mouth 2 (two) times a week.      No current facility-administered medications on file prior to visit.    Allergies  Allergen Reactions   Demeclocycline Other (See Comments)    Severe stomach cramps Severe stomach cramps Severe stomach cramps   Lisinopril Cough   Tetracyclines & Related Other (See Comments)    Severe stomach cramps    Family History  Problem Relation Age of Onset   Stroke Mother    Cancer Mother        breast   Cancer Father        lung   Stroke Father    Diabetes Neg Hx     BP 120/60   Pulse 97   Ht 5\' 3"  (1.6 m)   Wt 142 lb 3.2 oz (64.5 kg)   SpO2 98%   BMI 25.19 kg/m    Review of Systems Denies N/D/HB    Objective:   Physical Exam   Lab Results  Component Value Date   HGBA1C 8.7 (A) 07/02/2021      Assessment & Plan:  Insulin-requiring type 2 DM: uncontrolled.  Noncompliance with meds.  Therefore, we won't increase med dosages.    Patient Instructions  Please continue the same diabetes medications.  It is important to not miss medications On this type of insulin schedule, you should eat meals on a regular schedule.  If a meal is missed or significantly delayed, your blood sugar could go low.  check your blood sugar  twice a day.  vary the time of day when you check, between before the 3 meals, and at bedtime.  also check if you have symptoms of your blood sugar being too high or too low.  please keep a record of the readings  and bring it to your next appointment here (or you can bring the meter itself).  You can write it on any piece of paper.  please call us sooner if your blood sugar goes below 70, or if you have a lot of readings over 200.   Blood tests are requested for you today.  We'll let you know about the results.   Please come back for a follow-up appointment in 3 months.

## 2021-07-31 DIAGNOSIS — M79672 Pain in left foot: Secondary | ICD-10-CM | POA: Diagnosis not present

## 2021-07-31 DIAGNOSIS — S8012XA Contusion of left lower leg, initial encounter: Secondary | ICD-10-CM | POA: Diagnosis not present

## 2021-07-31 DIAGNOSIS — L03112 Cellulitis of left axilla: Secondary | ICD-10-CM | POA: Diagnosis not present

## 2021-07-31 DIAGNOSIS — S80812A Abrasion, left lower leg, initial encounter: Secondary | ICD-10-CM | POA: Diagnosis not present

## 2021-07-31 DIAGNOSIS — M79662 Pain in left lower leg: Secondary | ICD-10-CM | POA: Diagnosis not present

## 2021-07-31 DIAGNOSIS — M25572 Pain in left ankle and joints of left foot: Secondary | ICD-10-CM | POA: Diagnosis not present

## 2021-08-04 ENCOUNTER — Ambulatory Visit: Payer: Medicare Other | Admitting: Cardiology

## 2021-08-08 DIAGNOSIS — L03116 Cellulitis of left lower limb: Secondary | ICD-10-CM | POA: Diagnosis not present

## 2021-08-08 DIAGNOSIS — M79605 Pain in left leg: Secondary | ICD-10-CM | POA: Diagnosis not present

## 2021-08-12 ENCOUNTER — Encounter: Payer: Self-pay | Admitting: Cardiology

## 2021-08-12 ENCOUNTER — Other Ambulatory Visit: Payer: Self-pay

## 2021-08-12 ENCOUNTER — Ambulatory Visit (INDEPENDENT_AMBULATORY_CARE_PROVIDER_SITE_OTHER): Payer: Medicare Other | Admitting: Cardiology

## 2021-08-12 VITALS — BP 122/58 | HR 72 | Ht 63.0 in | Wt 141.4 lb

## 2021-08-12 DIAGNOSIS — E1169 Type 2 diabetes mellitus with other specified complication: Secondary | ICD-10-CM

## 2021-08-12 DIAGNOSIS — I2510 Atherosclerotic heart disease of native coronary artery without angina pectoris: Secondary | ICD-10-CM

## 2021-08-12 DIAGNOSIS — E785 Hyperlipidemia, unspecified: Secondary | ICD-10-CM

## 2021-08-12 DIAGNOSIS — E119 Type 2 diabetes mellitus without complications: Secondary | ICD-10-CM | POA: Diagnosis not present

## 2021-08-12 DIAGNOSIS — Z794 Long term (current) use of insulin: Secondary | ICD-10-CM

## 2021-08-12 DIAGNOSIS — I10 Essential (primary) hypertension: Secondary | ICD-10-CM | POA: Diagnosis not present

## 2021-08-12 NOTE — Progress Notes (Signed)
Cardiology Office Note    Date:  08/12/2021   ID:  Alicia Medina, DOB [birth year], MRN [id number]  PCP:  Pcp, No  Cardiologist:  Dr. Martinique   Chief Complaint  Patient presents with   Coronary Artery Disease    History of Present Illness:  Alicia Medina is a 88 y.o. female with PMH of CAD, HTN, HLD, hypothyroidism, and DM II. patient had a inferior MI treated with BMS to RCA in 08/2011.  She required defibrillation multiple times and her hospitalization was complicated by cardiogenic shock, V. fib, VDRF, and complete heart block requiring temporary pacemaker.  Unfortunately, she had abrupt occlusion of the RCA stent and was treated with a second BMS to RCA.  Follow-up Myoview in March 2013 showed normal perfusion with EF of 76%.  Patient was seen in Feb 2020 with  intermittent chest pain radiating to her back between her shoulder blades.  Myoview showed moderate perfusion defect in the inferoapex, lateral apex, and and her apex that is new when compared to 2013.  The study was interpreted as intermediate risk with EF of 63%.  Patient ultimately underwent elective cardiac catheterization on 10/11/2018 which revealed a 100% proximal to mid LAD occlusion which was treated with 2.75 x 28 mm Synergy DES, widely patent mid RCA stent.   On follow up today she is doing well. She denies any significant chest pain or dyspnea. No palpitations. Her husband passed away last year. She says she is still not convinced she has DM. Last A1c 8.7%.   Past Medical History:  Diagnosis Date   Arthritis    CAD (coronary artery disease) CARDIOLOGIST-  DR Martinique   08-28-2011 Inferior STEMI with BMS to the RCA, complicated by cardiogenic shock, VF, VDRF , temporary CHB and abrupt reocclusion of the RCA stent treated with a second BMS to the RCA. Has residual LAD disease. EF is 50%   Diverticulosis of colon    Hemorrhoids, internal, with bleeding    Hyperlipidemia    Hypertension    Hypothyroidism, postsurgical     S/p bare metal coronary artery stent    2013   Type 2 diabetes mellitus ([place])    Wears hearing aid    both ears    Past Surgical History:  Procedure Laterality Date   CARDIOVASCULAR STRESS TEST  10-03-2011  dr Martinique   normal perfusion / no ischemia/ ef 77%   CARPAL TUNNEL RELEASE Right 01/25/2013   Procedure: CARPAL TUNNEL RELEASE;  Surgeon: Wynonia Sours, MD;  Location: [place];  Service: Orthopedics;  Laterality: Right;   CARPAL TUNNEL RELEASE Left 10/25/2013   Procedure: LEFT CARPAL TUNNEL RELEASE;  Surgeon: Wynonia Sours, MD;  Location: [place];  Service: Orthopedics;  Laterality: Left;   CATARACT EXTRACTION W/ INTRAOCULAR LENS  IMPLANT, BILATERAL  2007   COLONOSCOPY  04-27-2003   CORONARY ANGIOGRAM Left 08/28/2011   Procedure: CORONARY ANGIOGRAM;  Surgeon: Jasyah Theurer M Martinique, MD;  Location: [facility] CATH LAB;  Service: Cardiovascular;  Laterality: Left;   CORONARY ANGIOPLASTY WITH STENT PLACEMENT  08-28-2011  dr Martinique   BMS to [facility] with abrupt reocclusion treated with second BMS/  LAD ostial 90%, proximal50-60%, first diagonal 80-90%   CORONARY STENT INTERVENTION N/A 10/11/2018   Procedure: CORONARY STENT INTERVENTION;  Surgeon: Martinique, Earle Burson M, MD;  Location: [place] CV LAB;  Service: Cardiovascular;  Laterality: N/A;   LEFT HEART CATH AND CORONARY ANGIOGRAPHY N/A 10/11/2018   Procedure: LEFT HEART CATH  AND CORONARY ANGIOGRAPHY;  Surgeon: Martinique, Emmalee Solivan M, MD;  Location: [place] CV LAB;  Service: Cardiovascular;  Laterality: N/A;   LEFT HEART CATHETERIZATION WITH CORONARY ANGIOGRAM N/A 08/28/2011   Procedure: LEFT HEART CATHETERIZATION WITH CORONARY ANGIOGRAM;  Surgeon: Zynasia Burklow M Martinique, MD;  Location: [facility] CATH LAB;  Service: Cardiovascular;  Laterality: N/A;   MASS EXCISION Right 08/06/2020   Procedure: EXCISION MASS AND DEBRIDEMENT PROXIMAL INTERPHALANGEAL JOINT RIGHT SMALL FINGER;  Surgeon: Daryll Brod, MD;  Location: [place];  Service:  Orthopedics;  Laterality: Right;  Bier block   PERCUTANEOUS CORONARY STENT INTERVENTION (PCI-S) Left 08/28/2011   Procedure: PERCUTANEOUS CORONARY STENT INTERVENTION (PCI-S);  Surgeon: Brodyn Depuy M Martinique, MD;  Location: [facility] CATH LAB;  Service: Cardiovascular;  Laterality: Left;   TOTAL THYROIDECTOMY Bilateral 05-02-2007   multinodular goiter   TRANSANAL HEMORRHOIDAL DEARTERIALIZATION N/A 03/28/2014   Procedure: TRANSANAL HEMORRHOIDAL DEARTERIALIZATION;  Surgeon: Leighton Ruff, MD;  Location: [facility];  Service: General;  Laterality: N/A;   TRANSTHORACIC ECHOCARDIOGRAM  08-30-2011   mild LVH/ ef 94-17%/  grade I diastolic dysfunction   TRIGGER FINGER RELEASE Right 01/25/2013   Procedure: RELEASE TRIGGER FINGER/A-1 PULLEY RIGHT INDEX FINGER;  Surgeon: Wynonia Sours, MD;  Location: [place];  Service: Orthopedics;  Laterality: Right;   TRIGGER FINGER RELEASE Left 10/25/2013   Procedure: RELEASE A-1 PULLEY LEFT MIDDLE FINGER;  Surgeon: Wynonia Sours, MD;  Location: [place];  Service: Orthopedics;  Laterality: Left;   TRIGGER FINGER RELEASE Left 12/04/2014   Procedure: RELEASE TRIGGER FINGER/A-1 PULLEY LEFT RING FINGER;  Surgeon: Daryll Brod, MD;  Location: [place];  Service: Orthopedics;  Laterality: Left;   TRIGGER FINGER RELEASE Right 03/30/2017   Procedure: RELEASE TRIGGER FINGER/A-1 PULLEY RIGHT RING FINGER AND RIGHT SMALL FINGER;  Surgeon: Daryll Brod, MD;  Location: [place];  Service: Orthopedics;  Laterality: Right;    Current Medications: Outpatient Medications Prior to Visit  Medication Sig Dispense Refill   acetaminophen (TYLENOL) 500 MG tablet Take 1,000 mg by mouth at bedtime.     Ascorbic Acid (VITAMIN C) 1000 MG tablet Take 1,000 mg by mouth every evening.     Aspirin Buf,CaCarb-MgCarb-MgO, 81 MG TABS aspirin 81 mg tablet  Take by oral route.     aspirin EC 81 MG tablet Take 81 mg by mouth daily.     Biotin w/  Vitamins C & E (HAIR/SKIN/NAILS PO) Take 1 tablet by mouth 2 (two) times daily.     bismuth subsalicylate (PEPTO BISMOL) 262 MG chewable tablet Chew 131-262 mg by mouth as directed.     calcium carbonate (OSCAL) 1500 (600 Ca) MG TABS tablet Take 600 mg of elemental calcium by mouth once a week.      Cholecalciferol (VITAMIN D-3) 125 MCG (5000 UT) TABS Take 5,000 Units by mouth daily.     Chromium Picolinate 1000 MCG TABS Take 1,000 mcg by mouth daily.     Coenzyme Q10 200 MG TABS Take 200 mg by mouth daily.      COLLAGEN PO Take 1,000 mg by mouth 2 (two) times daily.     Dulaglutide (TRULICITY) 1.5 EY/8.1KG SOPN Inject 1.5 mg into the skin once a week. 6 mL 3   ESTRACE VAGINAL 0.1 MG/GM vaginal cream      folic acid (FOLVITE) 818 MCG tablet Take 800 mcg by mouth daily.     glucose blood (TRUE METRIX BLOOD GLUCOSE TEST) test strip 1 each  by Other route 2 (two) times daily. And lancets 2/day 200 each 3   loperamide (IMODIUM A-D) 2 MG tablet Take 1-2 mg by mouth as directed.     loratadine (CLARITIN) 10 MG tablet Take 10 mg by mouth at bedtime.     losartan (COZAAR) 25 MG tablet TAKE 1 TABLET (25 MG TOTAL) BY MOUTH EVERY MORNING. 90 tablet 3   Magnesium 250 MG TABS Take 250 mg by mouth daily.     metoprolol tartrate (LOPRESSOR) 50 MG tablet TAKE 1 TABLET BY MOUTH 2 TIMES DAILY. 180 tablet 3   Multiple Vitamin (MULTIVITAMIN) tablet Take 1 tablet by mouth once a week.      nitrofurantoin, macrocrystal-monohydrate, (MACROBID) 100 MG capsule Take 100 mg by mouth 2 (two) times daily.     NOVOLIN N FLEXPEN 100 UNIT/ML Kiwkpen INJECT 35 UNITS SUBCUTANEOUSLY IN THE MORNING 15 mL 0   Psyllium (METAMUCIL PO) Take 1 capsule by mouth daily.     rosuvastatin (CRESTOR) 20 MG tablet TAKE 1 TABLET (20 MG TOTAL) BY MOUTH DAILY. 90 tablet 3   sulfamethoxazole-trimethoprim (BACTRIM DS) 800-160 MG tablet Take 1 tablet by mouth 2 (two) times daily.     SYNTHROID 175 MCG tablet TAKE 1 TABLET (175 MCG TOTAL) BY MOUTH  DAILY BEFORE BREAKFAST. 90 tablet 1   traMADol (ULTRAM) 50 MG tablet Take 1 tablet (50 mg total) by mouth every 6 (six) hours as needed. 20 tablet 0   vitamin B-12 (CYANOCOBALAMIN) 500 MCG tablet Take 500 mcg by mouth daily.     VITAMIN E PO Take 500 mg by mouth 2 (two) times a week.      No facility-administered medications prior to visit.     Allergies:   Demeclocycline, Lisinopril, and Tetracyclines & related   Social History   Socioeconomic History   Marital status: Married    Spouse name: Not on file   Number of children: Not on file   Years of education: Not on file   Highest education level: Not on file  Occupational History   Not on file  Tobacco Use   Smoking status: Never   Smokeless tobacco: Never  Vaping Use   Vaping Use: Never used  Substance and Sexual Activity   Alcohol use: Yes    Alcohol/week: 1.0 standard drink    Types: 1 Glasses of wine per week    Comment: social   Drug use: No   Sexual activity: Not on file  Other Topics Concern   Not on file  Social History Narrative   Lives with husband.   Has 2 sons- both healthy.   Younger son lives in [place], [place] in order son lives in [place].   Social Determinants of Health   Financial Resource Strain: Not on file  Food Insecurity: Not on file  Transportation Needs: Not on file  Physical Activity: Not on file  Stress: Not on file  Social Connections: Not on file     Family History:  The patient's family history includes Cancer in her father and mother; Stroke in her father and mother.   ROS:   Please see the history of present illness.    ROS All other systems reviewed and are negative.   PHYSICAL EXAM:   VS:  BP (!) 122/58    Pulse 72    Ht 5\' 3"  (1.6 m)    Wt 366 lb 6.4 oz (64.1 kg)    SpO2 95%    BMI 25.05 kg/m  GEN: Well nourished, well developed, in no acute distress  HEENT: normal  Neck: no JVD, carotid bruits, or masses Cardiac: RRR; no murmurs, rubs, or gallops,no edema   Respiratory:  clear to auscultation bilaterally, normal work of breathing GI: soft, nontender, nondistended, + BS MS: no deformity or atrophy  Skin: warm and dry, no rash. Abrasion on left shin Neuro:  Alert and Oriented x 3, Strength and sensation are intact Psych: euthymic mood, full affect  Wt Readings from Last 3 Encounters:  08/12/21 141 lb 6.4 oz (64.1 kg)  07/02/21 142 lb 3.2 oz (64.5 kg)  03/26/21 148 lb (67.1 kg)      Studies/Labs Reviewed:   EKG:  EKG is not  ordered today.    Recent Labs: 07/02/2021: BUN 15; Creatinine, Ser 1.03; Potassium 4.1; Sodium 136; TSH 10.52  Dated 10/01/20: A1c 7.5%.  Dated 07/02/21: A1c 8.7%  Lipid Panel    Component Value Date/Time   CHOL 135 06/11/2020 1205   TRIG 121 06/11/2020 1205   HDL 47 06/11/2020 1205   CHOLHDL 2.9 06/11/2020 1205   CHOLHDL 2.6 12/10/2016 0954   VLDL 26 12/10/2016 0954   LDLCALC 66 06/11/2020 1205   Ecg is not done today.   Additional studies/ records that were reviewed today include:   Myoview 09/22/18: Study Highlights    The left ventricular ejection fraction is normal (55-65%). Nuclear stress EF: 63%. There was no ST segment deviation noted during stress. Defect 1: There is a medium size severe perfusion defect present in the apical anterior, apical inferior, apical lateral and apex location. Defect 2: There is a small defect of severe severity present in the mid anterior location. Findings consistent with prior myocardial infarction. This is an intermediate risk study.   Medium size severe perfusion defect in the inferoapex, lateral apex and anteroapex, fixed without evidence of ischemia.  Small fixed perfusion defect in the anterior wall at the mid ventricle.   Evidence of infarction without evidence of ischemia.     Cath 10/11/2018 Prox LAD to Mid LAD lesion is 100% stenosed. Previously placed Mid RCA stent (unknown type) is widely patent. Post intervention, there is a 0% residual  stenosis. A drug-eluting stent was successfully placed using a STENT SYNERGY DES 2.75X28. LV end diastolic pressure is normal.   1. Single vessel occlusive CAD with CTO of the mid LAD 2. Continued patency of stents in the RCA 3. Normal LVEDP 4. Successful CTO PCI of the mid LAD with DES x 1   Plan: DAPT for one year. Patient is a candidate for same day discharge.      ASSESSMENT:    1. Coronary artery disease involving native coronary artery of native heart without angina pectoris   2. Hyperlipidemia associated with type 2 diabetes mellitus ([place])   3. Essential hypertension   4. Type 2 diabetes mellitus without complication, with long-term current use of insulin ([facility])      PLAN:  In order of problems listed above:  CAD: S/p remote BMS of RCA 2013. s/p DES of mid LAD in March 2020.  Continue ASA 81 mg daily. On statin and beta blocker.   She is asymptomatic.   Hypertension: BP is well controlled.  Hyperlipidemia: Continue statin therapy, Crestor 20 mg daily. Will update labs today  DM2: Managed by Dr Loanne Drilling. Last A1c 8.7%. on insulin and Trulicity  Hypothyroidism: On Synthroid. Last TSH 10.52 - per Dr Loanne Drilling.     Follow up in 6 months.  Medication Adjustments/Labs  and Tests Ordered: Current medicines are reviewed at length with the patient today.  Concerns regarding medicines are outlined above.  Medication changes, Labs and Tests ordered today are listed in the Patient Instructions below. There are no Patient Instructions on file for this visit.   Signed, Kenadi Miltner Martinique, MD  08/12/2021 11:02 AM    [facility] [address], [place], [place]  [address] Phone: [phone]; Fax: [phone]

## 2021-08-13 LAB — LIPID PANEL
Chol/HDL Ratio: 3 ratio (ref 0.0–4.4)
Cholesterol, Total: 128 mg/dL (ref 100–199)
HDL: 42 mg/dL (ref >39)
LDL Chol Calc (NIH): 66 mg/dL (ref 0–99)
Triglycerides: 108 mg/dL (ref 0–149)
VLDL Cholesterol Cal: 20 mg/dL (ref 5–40)

## 2021-08-13 LAB — HEPATIC FUNCTION PANEL
ALT: 21 IU/L (ref 0–32)
AST: 20 IU/L (ref 0–40)
Albumin: 4.1 g/dL (ref 3.6–4.6)
Alkaline Phosphatase: 60 IU/L (ref 44–121)
Bilirubin Total: 0.3 mg/dL (ref 0.0–1.2)
Bilirubin, Direct: 0.12 mg/dL (ref 0.00–0.40)
Total Protein: 7.3 g/dL (ref 6.0–8.5)

## 2021-08-13 LAB — BASIC METABOLIC PANEL
BUN/Creatinine Ratio: 19 (ref 12–28)
BUN: 16 mg/dL (ref 8–27)
CO2: 20 mmol/L (ref 20–29)
Calcium: 9.5 mg/dL (ref 8.7–10.3)
Chloride: 100 mmol/L (ref 96–106)
Creatinine, Ser: 0.85 mg/dL (ref 0.57–1.00)
Glucose: 286 mg/dL — ABNORMAL HIGH (ref 70–99)
Potassium: 4.5 mmol/L (ref 3.5–5.2)
Sodium: 142 mmol/L (ref 134–144)
eGFR: 67 mL/min/{1.73_m2} (ref >59)

## 2021-08-19 DIAGNOSIS — N819 Female genital prolapse, unspecified: Secondary | ICD-10-CM | POA: Diagnosis not present

## 2021-08-21 ENCOUNTER — Telehealth: Payer: Self-pay

## 2021-08-21 NOTE — Telephone Encounter (Signed)
-----   Message from Renato Shin, MD sent at 08/21/2021  4:37 PM EST ----- Please move up next appt to next available.  ----- Message ----- From: Luanna Salk, LPN Sent: [phone]/[id number]/[id number]  11:52 AM EST To: Renato Shin, MD  Called patient left results on personal voice mail.Advised to follow up with Dr.Ellison about elevated sugar.Results sent to [place].

## 2021-08-21 NOTE — Telephone Encounter (Signed)
Attempted to contact the patient regarding moving appt up. LVM for a call back

## 2021-10-01 ENCOUNTER — Other Ambulatory Visit: Payer: Self-pay

## 2021-10-01 ENCOUNTER — Ambulatory Visit (INDEPENDENT_AMBULATORY_CARE_PROVIDER_SITE_OTHER): Payer: Medicare Other | Admitting: Endocrinology

## 2021-10-01 VITALS — BP 130/60 | HR 87 | Ht 63.0 in | Wt 142.4 lb

## 2021-10-01 DIAGNOSIS — E1165 Type 2 diabetes mellitus with hyperglycemia: Secondary | ICD-10-CM | POA: Diagnosis not present

## 2021-10-01 DIAGNOSIS — N183 Chronic kidney disease, stage 3 unspecified: Secondary | ICD-10-CM | POA: Diagnosis not present

## 2021-10-01 DIAGNOSIS — E1159 Type 2 diabetes mellitus with other circulatory complications: Secondary | ICD-10-CM | POA: Diagnosis not present

## 2021-10-01 DIAGNOSIS — Z794 Long term (current) use of insulin: Secondary | ICD-10-CM | POA: Diagnosis not present

## 2021-10-01 DIAGNOSIS — E1122 Type 2 diabetes mellitus with diabetic chronic kidney disease: Secondary | ICD-10-CM | POA: Diagnosis not present

## 2021-10-01 DIAGNOSIS — E1142 Type 2 diabetes mellitus with diabetic polyneuropathy: Secondary | ICD-10-CM | POA: Diagnosis not present

## 2021-10-01 DIAGNOSIS — E0829 Diabetes mellitus due to underlying condition with other diabetic kidney complication: Secondary | ICD-10-CM

## 2021-10-01 DIAGNOSIS — R809 Proteinuria, unspecified: Secondary | ICD-10-CM

## 2021-10-01 LAB — POCT GLYCOSYLATED HEMOGLOBIN (HGB A1C): Hemoglobin A1C: 8.5 % — AB (ref 4.0–5.6)

## 2021-10-01 MED ORDER — TRULICITY 3 MG/0.5ML ~~LOC~~ SOAJ: 3.0000 mg | SUBCUTANEOUS | 3 refills | Status: DC

## 2021-10-01 NOTE — Patient Instructions (Addendum)
Please continue the same Trulicity, and stay off the insulin.   ?It is best to never miss the synthroid.  However, if you do miss it, next best is to double up the next time.   ?check your blood sugar twice a day.  vary the time of day when you check, between before the 3 meals, and at bedtime.  also check if you have symptoms of your blood sugar being too high or too low.  please keep a record of the readings and bring it to your next appointment here (or you can bring the meter itself).  You can write it on any piece of paper.  please call us sooner if your blood sugar goes below 70, or if you have a lot of readings over 200.   ?Please come back for a follow-up appointment in 2 months.   ?

## 2021-10-01 NOTE — Progress Notes (Signed)
Subjective:    Patient ID: Alicia Medina, female    DOB: [birth year], 88 y.o.   MRN: [id number]  HPI Pt returns for f/u of diabetes mellitus:  DM type: Insulin-requiring type 2.   Dx'ed: 7858 Complications: CAD, PN, and stage 3 CRI.  Therapy: insulin since 8502, and Trulicity.   GDM: never.  DKA: never.   Severe hypoglycemia: never.   Pancreatitis: never.  Pancreatic imaging: never Other: she declines multiple daily injections; She did not tolerate metformin or Ozempic (diarrhea with both); she changed lantus to levemir, then NPH, based on the pattern of cbg's.     Interval history: Pt says she misses most doses of insulin.  she brings a record of her cbg's which I have reviewed today.  Cbg varies from 110-219.   She had thyroidect in 2008 (benign pathol; she has been on synthroid since then; she requests brand name synthroid). She still often misses synthroid.   Past Medical History:  Diagnosis Date   Arthritis    CAD (coronary artery disease) CARDIOLOGIST-  DR Martinique   08-28-2011 Inferior STEMI with BMS to the RCA, complicated by cardiogenic shock, VF, VDRF , temporary CHB and abrupt reocclusion of the RCA stent treated with a second BMS to the RCA. Has residual LAD disease. EF is 50%   Diverticulosis of colon    Hemorrhoids, internal, with bleeding    Hyperlipidemia    Hypertension    Hypothyroidism, postsurgical    S/p bare metal coronary artery stent    2013   Type 2 diabetes mellitus ([place])    Wears hearing aid    both ears    Past Surgical History:  Procedure Laterality Date   CARDIOVASCULAR STRESS TEST  10-03-2011  dr Martinique   normal perfusion / no ischemia/ ef 77%   CARPAL TUNNEL RELEASE Right 01/25/2013   Procedure: CARPAL TUNNEL RELEASE;  Surgeon: Wynonia Sours, MD;  Location: [place];  Service: Orthopedics;  Laterality: Right;   CARPAL TUNNEL RELEASE Left 10/25/2013   Procedure: LEFT CARPAL TUNNEL RELEASE;  Surgeon: Wynonia Sours, MD;  Location:  [place];  Service: Orthopedics;  Laterality: Left;   CATARACT EXTRACTION W/ INTRAOCULAR LENS  IMPLANT, BILATERAL  2007   COLONOSCOPY  04-27-2003   CORONARY ANGIOGRAM Left 08/28/2011   Procedure: CORONARY ANGIOGRAM;  Surgeon: Peter M Martinique, MD;  Location: [facility] CATH LAB;  Service: Cardiovascular;  Laterality: Left;   CORONARY ANGIOPLASTY WITH STENT PLACEMENT  08-28-2011  dr Martinique   BMS to [facility] with abrupt reocclusion treated with second BMS/  LAD ostial 90%, proximal50-60%, first diagonal 80-90%   CORONARY STENT INTERVENTION N/A 10/11/2018   Procedure: CORONARY STENT INTERVENTION;  Surgeon: Martinique, Peter M, MD;  Location: [place] CV LAB;  Service: Cardiovascular;  Laterality: N/A;   LEFT HEART CATH AND CORONARY ANGIOGRAPHY N/A 10/11/2018   Procedure: LEFT HEART CATH AND CORONARY ANGIOGRAPHY;  Surgeon: Martinique, Peter M, MD;  Location: [place] CV LAB;  Service: Cardiovascular;  Laterality: N/A;   LEFT HEART CATHETERIZATION WITH CORONARY ANGIOGRAM N/A 08/28/2011   Procedure: LEFT HEART CATHETERIZATION WITH CORONARY ANGIOGRAM;  Surgeon: Peter M Martinique, MD;  Location: [facility] CATH LAB;  Service: Cardiovascular;  Laterality: N/A;   MASS EXCISION Right 08/06/2020   Procedure: EXCISION MASS AND DEBRIDEMENT PROXIMAL INTERPHALANGEAL JOINT RIGHT SMALL FINGER;  Surgeon: Daryll Brod, MD;  Location: [place];  Service: Orthopedics;  Laterality: Right;  Bier block   PERCUTANEOUS CORONARY STENT INTERVENTION (  PCI-S) Left 08/28/2011   Procedure: PERCUTANEOUS CORONARY STENT INTERVENTION (PCI-S);  Surgeon: Peter M Martinique, MD;  Location: [facility] CATH LAB;  Service: Cardiovascular;  Laterality: Left;   TOTAL THYROIDECTOMY Bilateral 05-02-2007   multinodular goiter   TRANSANAL HEMORRHOIDAL DEARTERIALIZATION N/A 03/28/2014   Procedure: TRANSANAL HEMORRHOIDAL DEARTERIALIZATION;  Surgeon: Leighton Ruff, MD;  Location: [facility];  Service: General;  Laterality: N/A;   TRANSTHORACIC  ECHOCARDIOGRAM  08-30-2011   mild LVH/ ef 18-84%/  grade I diastolic dysfunction   TRIGGER FINGER RELEASE Right 01/25/2013   Procedure: RELEASE TRIGGER FINGER/A-1 PULLEY RIGHT INDEX FINGER;  Surgeon: Wynonia Sours, MD;  Location: [place];  Service: Orthopedics;  Laterality: Right;   TRIGGER FINGER RELEASE Left 10/25/2013   Procedure: RELEASE A-1 PULLEY LEFT MIDDLE FINGER;  Surgeon: Wynonia Sours, MD;  Location: [place];  Service: Orthopedics;  Laterality: Left;   TRIGGER FINGER RELEASE Left 12/04/2014   Procedure: RELEASE TRIGGER FINGER/A-1 PULLEY LEFT RING FINGER;  Surgeon: Daryll Brod, MD;  Location: [place];  Service: Orthopedics;  Laterality: Left;   TRIGGER FINGER RELEASE Right 03/30/2017   Procedure: RELEASE TRIGGER FINGER/A-1 PULLEY RIGHT RING FINGER AND RIGHT SMALL FINGER;  Surgeon: Daryll Brod, MD;  Location: [place];  Service: Orthopedics;  Laterality: Right;    Social History   Socioeconomic History   Marital status: Married    Spouse name: Not on file   Number of children: Not on file   Years of education: Not on file   Highest education level: Not on file  Occupational History   Not on file  Tobacco Use   Smoking status: Never   Smokeless tobacco: Never  Vaping Use   Vaping Use: Never used  Substance and Sexual Activity   Alcohol use: Yes    Alcohol/week: 1.0 standard drink    Types: 1 Glasses of wine per week    Comment: social   Drug use: No   Sexual activity: Not on file  Other Topics Concern   Not on file  Social History Narrative   Lives with husband.   Has 2 sons- both healthy.   Younger son lives in [place], [place] in order son lives in [place].   Social Determinants of Health   Financial Resource Strain: Not on file  Food Insecurity: Not on file  Transportation Needs: Not on file  Physical Activity: Not on file  Stress: Not on file  Social Connections: Not on file  Intimate Partner  Violence: Not on file    Current Outpatient Medications on File Prior to Visit  Medication Sig Dispense Refill   acetaminophen (TYLENOL) 500 MG tablet Take 1,000 mg by mouth at bedtime.     Ascorbic Acid (VITAMIN C) 1000 MG tablet Take 1,000 mg by mouth every evening.     Aspirin Buf,CaCarb-MgCarb-MgO, 81 MG TABS aspirin 81 mg tablet  Take by oral route.     aspirin EC 81 MG tablet Take 81 mg by mouth daily.     Biotin w/ Vitamins C & E (HAIR/SKIN/NAILS PO) Take 1 tablet by mouth 2 (two) times daily.     bismuth subsalicylate (PEPTO BISMOL) 262 MG chewable tablet Chew 131-262 mg by mouth as directed.     calcium carbonate (OSCAL) 1500 (600 Ca) MG TABS tablet Take 600 mg of elemental calcium by mouth once a week.      Cholecalciferol (VITAMIN D-3) 125 MCG (5000 UT) TABS Take 5,000 Units by mouth  daily.     Chromium Picolinate 1000 MCG TABS Take 1,000 mcg by mouth daily.     Coenzyme Q10 200 MG TABS Take 200 mg by mouth daily.      COLLAGEN PO Take 1,000 mg by mouth 2 (two) times daily.     ESTRACE VAGINAL 0.1 MG/GM vaginal cream      folic acid (FOLVITE) 353 MCG tablet Take 800 mcg by mouth daily.     glucose blood (TRUE METRIX BLOOD GLUCOSE TEST) test strip 1 each by Other route 2 (two) times daily. And lancets 2/day 200 each 3   loperamide (IMODIUM A-D) 2 MG tablet Take 1-2 mg by mouth as directed.     loratadine (CLARITIN) 10 MG tablet Take 10 mg by mouth at bedtime.     losartan (COZAAR) 25 MG tablet TAKE 1 TABLET (25 MG TOTAL) BY MOUTH EVERY MORNING. 90 tablet 3   Magnesium 250 MG TABS Take 250 mg by mouth daily.     metoprolol tartrate (LOPRESSOR) 50 MG tablet TAKE 1 TABLET BY MOUTH 2 TIMES DAILY. 180 tablet 3   Multiple Vitamin (MULTIVITAMIN) tablet Take 1 tablet by mouth once a week.      nitrofurantoin, macrocrystal-monohydrate, (MACROBID) 100 MG capsule Take 100 mg by mouth 2 (two) times daily.     Psyllium (METAMUCIL PO) Take 1 capsule by mouth daily.     rosuvastatin (CRESTOR)  20 MG tablet TAKE 1 TABLET (20 MG TOTAL) BY MOUTH DAILY. 90 tablet 3   sulfamethoxazole-trimethoprim (BACTRIM DS) 800-160 MG tablet Take 1 tablet by mouth 2 (two) times daily.     SYNTHROID 175 MCG tablet TAKE 1 TABLET (175 MCG TOTAL) BY MOUTH DAILY BEFORE BREAKFAST. 90 tablet 1   traMADol (ULTRAM) 50 MG tablet Take 1 tablet (50 mg total) by mouth every 6 (six) hours as needed. 20 tablet 0   vitamin B-12 (CYANOCOBALAMIN) 500 MCG tablet Take 500 mcg by mouth daily.     VITAMIN E PO Take 500 mg by mouth 2 (two) times a week.      No current facility-administered medications on file prior to visit.    Allergies  Allergen Reactions   Demeclocycline Other (See Comments)    Severe stomach cramps Severe stomach cramps Severe stomach cramps   Lisinopril Cough   Tetracyclines & Related Other (See Comments)    Severe stomach cramps    Family History  Problem Relation Age of Onset   Stroke Mother    Cancer Mother        breast   Cancer Father        lung   Stroke Father    Diabetes Neg Hx     BP 130/60    Pulse 87    Ht '5\' 3"'$  (1.6 m)    Wt 142 lb 6.4 oz (64.6 kg)    SpO2 97%    BMI 25.23 kg/m    Review of Systems She denies hypoglycemia/N/V/HB.       Objective:   Physical Exam    Lab Results  Component Value Date   HGBA1C 8.5 (A) 10/01/2021      Assessment & Plan:  Hypothyroidism. therapy limited by noncompliance.  She declines to change TFT today.  Insulin-requiring type 2 DM: uncontrolled, due to noncompliance.   Patient Instructions  Please continue the same Trulicity, and stay off the insulin.   It is best to never miss the synthroid.  However, if you do miss it, next best is to double  up the next time.   check your blood sugar twice a day.  vary the time of day when you check, between before the 3 meals, and at bedtime.  also check if you have symptoms of your blood sugar being too high or too low.  please keep a record of the readings and bring it to your next  appointment here (or you can bring the meter itself).  You can write it on any piece of paper.  please call us sooner if your blood sugar goes below 70, or if you have a lot of readings over 200.   Please come back for a follow-up appointment in 2 months.

## 2021-10-30 ENCOUNTER — Ambulatory Visit: Payer: Medicare Other | Admitting: Cardiology

## 2021-11-10 DIAGNOSIS — N814 Uterovaginal prolapse, unspecified: Secondary | ICD-10-CM | POA: Diagnosis not present

## 2021-12-09 ENCOUNTER — Ambulatory Visit: Payer: Medicare Other | Admitting: Endocrinology

## 2021-12-10 ENCOUNTER — Ambulatory Visit: Payer: Medicare Other | Admitting: Endocrinology

## 2021-12-18 ENCOUNTER — Telehealth: Payer: Self-pay | Admitting: Cardiology

## 2021-12-18 NOTE — Telephone Encounter (Signed)
Patient would like to know if Dr. Martinique would give her a form for her to give handicap permit that would like 5 years.

## 2021-12-18 NOTE — Telephone Encounter (Signed)
Called patient, advised I would send to MD to review. She would like to have the placecard that is for 5 years, not just 6 months. She does not use it all the time- but it is nice to have.   I advised I would send to MD.  Patient verbalized understanding.

## 2021-12-19 NOTE — Telephone Encounter (Signed)
Called pt. Left message that placard renewal application has been signed and she can come by the office to pick this up.

## 2021-12-19 NOTE — Telephone Encounter (Signed)
Called patient, I have printed out the information for the 5 year place card- she would like Dr.Jordan to fill it out today if possible, and she will come pick it up so she can take it by there and pay and have what she needs. Advised I would print one- have it taken to MD to review and sign. Will route to nurse covering him today while primary nurse is out.  Thanks!

## 2021-12-23 ENCOUNTER — Other Ambulatory Visit: Payer: Self-pay | Admitting: Cardiology

## 2021-12-25 NOTE — Telephone Encounter (Signed)
Left message for pt to call back  °

## 2021-12-25 NOTE — Telephone Encounter (Signed)
Alicia Medina with [employer] [employer] following up regarding application. She states it is missing the doctor's physician number, but a call may be returned to the patient to provide this information. She states a call back to her will not be necessary.

## 2022-01-01 NOTE — Telephone Encounter (Signed)
Left message for pt to call.

## 2022-01-07 NOTE — Telephone Encounter (Signed)
LMTCB

## 2022-01-08 ENCOUNTER — Telehealth: Payer: Self-pay | Admitting: Cardiology

## 2022-01-08 NOTE — Telephone Encounter (Signed)
LMTCB regarding care tag.

## 2022-01-08 NOTE — Telephone Encounter (Signed)
Follow Up:     Patient is returning a call from Yesterday(01-07-22)

## 2022-01-16 DIAGNOSIS — Z961 Presence of intraocular lens: Secondary | ICD-10-CM | POA: Diagnosis not present

## 2022-01-16 DIAGNOSIS — H524 Presbyopia: Secondary | ICD-10-CM | POA: Diagnosis not present

## 2022-01-16 DIAGNOSIS — D3132 Benign neoplasm of left choroid: Secondary | ICD-10-CM | POA: Diagnosis not present

## 2022-02-04 DIAGNOSIS — N814 Uterovaginal prolapse, unspecified: Secondary | ICD-10-CM | POA: Diagnosis not present

## 2022-02-04 DIAGNOSIS — N819 Female genital prolapse, unspecified: Secondary | ICD-10-CM | POA: Diagnosis not present

## 2022-02-08 NOTE — Progress Notes (Signed)
Cardiology Office Note    Date:  02/13/2022   ID:  Alicia Medina, DOB [birth year], MRN [id number]  PCP:  Pcp, No  Cardiologist:  Dr. Martinique   Chief Complaint  Patient presents with   Coronary Artery Disease    History of Present Illness:  Alicia Medina is a 88 y.o. female with PMH of CAD, HTN, HLD, hypothyroidism, and DM II. patient had a inferior MI treated with BMS to RCA in 08/2011.  She required defibrillation multiple times and her hospitalization was complicated by cardiogenic shock, V. fib, VDRF, and complete heart block requiring temporary pacemaker.  Unfortunately, she had abrupt occlusion of the RCA stent and was treated with a second BMS to RCA.  Follow-up Myoview in March 2013 showed normal perfusion with EF of 76%.  Patient was seen in Feb 2020 with  intermittent chest pain radiating to her back between her shoulder blades.  Myoview showed moderate perfusion defect in the inferoapex, lateral apex, and and her apex that is new when compared to 2013.  The study was interpreted as intermediate risk with EF of 63%.  Patient ultimately underwent elective cardiac catheterization on 10/11/2018 which revealed a 100% proximal to mid LAD occlusion which was treated with 2.75 x 28 mm Synergy DES, widely patent mid RCA stent.   On follow up today she is doing well. She denies any significant chest pain or dyspnea. No palpitations.  She says she is still not convinced she has DM. Last A1c 8.5%. she reports she is using Trulicity once a week.  Past Medical History:  Diagnosis Date   Arthritis    CAD (coronary artery disease) CARDIOLOGIST-  DR Martinique   08-28-2011 Inferior STEMI with BMS to the RCA, complicated by cardiogenic shock, VF, VDRF , temporary CHB and abrupt reocclusion of the RCA stent treated with a second BMS to the RCA. Has residual LAD disease. EF is 50%   Diverticulosis of colon    Hemorrhoids, internal, with bleeding    Hyperlipidemia    Hypertension    Hypothyroidism,  postsurgical    S/p bare metal coronary artery stent    2013   Type 2 diabetes mellitus ([place])    Wears hearing aid    both ears    Past Surgical History:  Procedure Laterality Date   CARDIOVASCULAR STRESS TEST  10-03-2011  dr Martinique   normal perfusion / no ischemia/ ef 77%   CARPAL TUNNEL RELEASE Right 01/25/2013   Procedure: CARPAL TUNNEL RELEASE;  Surgeon: Wynonia Sours, MD;  Location: [facility];  Service: Orthopedics;  Laterality: Right;   CARPAL TUNNEL RELEASE Left 10/25/2013   Procedure: LEFT CARPAL TUNNEL RELEASE;  Surgeon: Wynonia Sours, MD;  Location: [facility];  Service: Orthopedics;  Laterality: Left;   CATARACT EXTRACTION W/ INTRAOCULAR LENS  IMPLANT, BILATERAL  2007   COLONOSCOPY  04-27-2003   CORONARY ANGIOGRAM Left 08/28/2011   Procedure: CORONARY ANGIOGRAM;  Surgeon: Jaycen Vercher M Martinique, MD;  Location: [facility] CATH LAB;  Service: Cardiovascular;  Laterality: Left;   CORONARY ANGIOPLASTY WITH STENT PLACEMENT  08-28-2011  dr Martinique   BMS to [facility] with abrupt reocclusion treated with second BMS/  LAD ostial 90%, proximal50-60%, first diagonal 80-90%   CORONARY STENT INTERVENTION N/A 10/11/2018   Procedure: CORONARY STENT INTERVENTION;  Surgeon: Martinique, Breanda Greenlaw M, MD;  Location: [facility] CV LAB;  Service: Cardiovascular;  Laterality: N/A;   LEFT HEART CATH AND CORONARY ANGIOGRAPHY N/A 10/11/2018   Procedure:  LEFT HEART CATH AND CORONARY ANGIOGRAPHY;  Surgeon: Martinique, Porfiria Heinrich M, MD;  Location: [facility] CV LAB;  Service: Cardiovascular;  Laterality: N/A;   LEFT HEART CATHETERIZATION WITH CORONARY ANGIOGRAM N/A 08/28/2011   Procedure: LEFT HEART CATHETERIZATION WITH CORONARY ANGIOGRAM;  Surgeon: Kyler Germer M Martinique, MD;  Location[facility] C[facility] LAB;  Service: Cardiovascular;  Laterality: N/A;   MASS EXCISION Right 08/06/2020   Procedure: EXCISION MASS AND DEBRIDEMENT PROXIMAL INTERPHALANGEAL JOINT RIGHT SMALL FINGER;  Surgeon: Daryll Brod, MD;  Location: [facility];   Service: Orthopedics;  Laterality: Right;  Bier block   PERCUTANEOUS CORONARY STENT INTERVENTION (PCI-S) Left 08/28/2011   Procedure: PERCUTANEOUS CORONARY STENT INTERVENTION (PCI-S);  Surgeon: Sharnise Blough M Martinique, MD;  Location: [facility] CATH LAB;  Service: Cardiovascular;  Laterality: Left;   TOTAL THYROIDECTOMY Bilateral 05-02-2007   multinodular goiter   TRANSANAL HEMORRHOIDAL DEARTERIALIZATION N/A 03/28/2014   Procedure: TRANSANAL HEMORRHOIDAL DEARTERIALIZATION;  Surgeon: Leighton Ruff, MD;  Location: [facility];  Service: General;  Laterality: N/A;   TRANSTHORACIC ECHOCARDIOGRAM  08-30-2011   mild LVH/ ef 08-14%/  grade I diastolic dysfunction   TRIGGER FINGER RELEASE Right 01/25/2013   Procedure: RELEASE TRIGGER FINGER/A-1 PULLEY RIGHT INDEX FINGER;  Surgeon: Wynonia Sours, MD;  Location: [facility];  Service: Orthopedics;  Laterality: Right;   TRIGGER FINGER RELEASE Left 10/25/2013   Procedure: RELEASE A-1 PULLEY LEFT MIDDLE FINGER;  Surgeon: Wynonia Sours, MD;  Location: [facility];  Service: Orthopedics;  Laterality: Left;   TRIGGER FINGER RELEASE Left 12/04/2014   Procedure: RELEASE TRIGGER FINGER/A-1 PULLEY LEFT RING FINGER;  Surgeon: Daryll Brod, MD;  Location: [facility];  Service: Orthopedics;  Laterality: Left;   TRIGGER FINGER RELEASE Right 03/30/2017   Procedure: RELEASE TRIGGER FINGER/A-1 PULLEY RIGHT RING FINGER AND RIGHT SMALL FINGER;  Surgeon: Daryll Brod, MD;  Location: [facility];  Service: Orthopedics;  Laterality: Right;    Current Medications: Outpatient Medications Prior to Visit  Medication Sig Dispense Refill   acetaminophen (TYLENOL) 500 MG tablet Take 1,000 mg by mouth at bedtime.     Ascorbic Acid (VITAMIN C) 1000 MG tablet Take 1,000 mg by mouth every evening.     aspirin EC 81 MG tablet Take 81 mg by mouth daily.     Biotin w/ Vitamins C & E (HAIR/SKIN/NAILS PO) Take 1 tablet by mouth 2 (two) times daily.      Cholecalciferol (VITAMIN D-3) 125 MCG (5000 UT) TABS Take 5,000 Units by mouth daily.     Dulaglutide (TRULICITY) 3 GY/1.8HU SOPN Inject 3 mg as directed once a week. 6 mL 3   glucose blood (TRUE METRIX BLOOD GLUCOSE TEST) test strip 1 each by Other route 2 (two) times daily. And lancets 2/day 200 each 3   loperamide (IMODIUM A-D) 2 MG tablet Take 1-2 mg by mouth as directed.     loratadine (CLARITIN) 10 MG tablet Take 10 mg by mouth at bedtime.     losartan (COZAAR) 25 MG tablet TAKE 1 TABLET (25 MG TOTAL) BY MOUTH EVERY MORNING. 90 tablet 3   metoprolol tartrate (LOPRESSOR) 50 MG tablet TAKE 1 TABLET BY MOUTH 2 TIMES DAILY. 180 tablet 3   rosuvastatin (CRESTOR) 20 MG tablet TAKE 1 TABLET BY MOUTH DAILY. 90 tablet 3   SYNTHROID 175 MCG tablet TAKE 1 TABLET (175 MCG TOTAL) BY MOUTH DAILY BEFORE BREAKFAST. 90 tablet 1   Magnesium 250 MG TABS Take 250 mg by mouth daily.  Multiple Vitamin (MULTIVITAMIN) tablet Take 1 tablet by mouth once a week.      Psyllium (METAMUCIL PO) Take 1 capsule by mouth daily.     Aspirin Buf,CaCarb-MgCarb-MgO, 81 MG TABS aspirin 81 mg tablet  Take by oral route. (Patient not taking: Reported on 02/13/2022)     bismuth subsalicylate (PEPTO BISMOL) 262 MG chewable tablet Chew 131-262 mg by mouth as directed. (Patient not taking: Reported on 02/13/2022)     calcium carbonate (OSCAL) 1500 (600 Ca) MG TABS tablet Take 600 mg of elemental calcium by mouth once a week.  (Patient not taking: Reported on 02/13/2022)     Chromium Picolinate 1000 MCG TABS Take 1,000 mcg by mouth daily. (Patient not taking: Reported on 02/13/2022)     Coenzyme Q10 200 MG TABS Take 200 mg by mouth daily.  (Patient not taking: Reported on 02/13/2022)     COLLAGEN PO Take 1,000 mg by mouth 2 (two) times daily. (Patient not taking: Reported on 02/13/2022)     ESTRACE VAGINAL 0.1 MG/GM vaginal cream  (Patient not taking: Reported on 5/40/9811)     folic acid (FOLVITE) 914 MCG tablet Take 800 mcg by  mouth daily. (Patient not taking: Reported on 02/13/2022)     nitrofurantoin, macrocrystal-monohydrate, (MACROBID) 100 MG capsule Take 100 mg by mouth 2 (two) times daily. (Patient not taking: Reported on 02/13/2022)     sulfamethoxazole-trimethoprim (BACTRIM DS) 800-160 MG tablet Take 1 tablet by mouth 2 (two) times daily. (Patient not taking: Reported on 02/13/2022)     traMADol (ULTRAM) 50 MG tablet Take 1 tablet (50 mg total) by mouth every 6 (six) hours as needed. (Patient not taking: Reported on 02/13/2022) 20 tablet 0   vitamin B-12 (CYANOCOBALAMIN) 500 MCG tablet Take 500 mcg by mouth daily. (Patient not taking: Reported on 02/13/2022)     VITAMIN E PO Take 500 mg by mouth 2 (two) times a week.  (Patient not taking: Reported on 02/13/2022)     No facility-administered medications prior to visit.     Allergies:   Demeclocycline, Lisinopril, and Tetracyclines & related   Social History   Socioeconomic History   Marital status: Married    Spouse name: Not on file   Number of children: Not on file   Years of education: Not on file   Highest education level: Not on file  Occupational History   Not on file  Tobacco Use   Smoking status: Never   Smokeless tobacco: Never  Vaping Use   Vaping Use: Never used  Substance and Sexual Activity   Alcohol use: Yes    Alcohol/week: 1.0 standard drink of alcohol    Types: 1 Glasses of wine per week    Comment: social   Drug use: No   Sexual activity: Not on file  Other Topics Concern   Not on file  Social History Narrative   Lives with husband.   Has 2 sons- both healthy.   Younger son lives in [place], [place] in order son lives in [place].   Social Determinants of Health   Financial Resource Strain: Not on file  Food Insecurity: Not on file  Transportation Needs: Not on file  Physical Activity: Not on file  Stress: Not on file  Social Connections: Not on file     Family History:  The patient's family history includes Cancer in her  father and mother; Stroke in her father and mother.   ROS:   Please see the history of present illness.  ROS All other systems reviewed and are negative.   PHYSICAL EXAM:   VS:  BP 130/72 (BP Location: Left Arm, Patient Position: Sitting, Cuff Size: Normal)   Pulse 83   Ht '5\' 3"'$  (1.6 m)   Wt 144 lb 6.4 oz (65.5 kg)   SpO2 96%   BMI 25.58 kg/m    GEN: Well nourished, well developed, in no acute distress  HEENT: normal  Neck: no JVD, carotid bruits, or masses Cardiac: RRR; no murmurs, rubs, or gallops,no edema  Respiratory:  clear to auscultation bilaterally, normal work of breathing GI: soft, nontender, nondistended, + BS MS: no deformity or atrophy  Skin: warm and dry, no rash. Abrasion on left shin Neuro:  Alert and Oriented x 3, Strength and sensation are intact Psych: euthymic mood, full affect  Wt Readings from Last 3 Encounters:  02/13/22 144 lb 6.4 oz (65.5 kg)  10/01/21 142 lb 6.4 oz (64.6 kg)  08/12/21 141 lb 6.4 oz (64.1 kg)      Studies/Labs Reviewed:   EKG:  EKG is  ordered today. NSR rate 83. Low voltage. No change. I have personally reviewed and interpreted this study.    Recent Labs: 07/02/2021: TSH 10.52 08/12/2021: ALT 21; BUN 16; Creatinine, Ser 0.85; Potassium 4.5; Sodium 142  Dated 10/01/20: A1c 7.5%.  Dated 07/02/21: A1c 8.7% Dated 10/01/21: 8.5%.   Lipid Panel    Component Value Date/Time   CHOL 128 08/12/2021 1120   TRIG 108 08/12/2021 1120   HDL 42 08/12/2021 1120   CHOLHDL 3.0 08/12/2021 1120   CHOLHDL 2.6 12/10/2016 0954   VLDL 26 12/10/2016 0954   LDLCALC 66 08/12/2021 1120    Additional studies/ records that were reviewed today include:   Myoview 09/22/18: Study Highlights    The left ventricular ejection fraction is normal (55-65%). Nuclear stress EF: 63%. There was no ST segment deviation noted during stress. Defect 1: There is a medium size severe perfusion defect present in the apical anterior, apical inferior, apical lateral  and apex location. Defect 2: There is a small defect of severe severity present in the mid anterior location. Findings consistent with prior myocardial infarction. This is an intermediate risk study.   Medium size severe perfusion defect in the inferoapex, lateral apex and anteroapex, fixed without evidence of ischemia.  Small fixed perfusion defect in the anterior wall at the mid ventricle.   Evidence of infarction without evidence of ischemia.     Cath 10/11/2018 Prox LAD to Mid LAD lesion is 100% stenosed. Previously placed Mid RCA stent (unknown type) is widely patent. Post intervention, there is a 0% residual stenosis. A drug-eluting stent was successfully placed using a STENT SYNERGY DES 2.75X28. LV end diastolic pressure is normal.   1. Single vessel occlusive CAD with CTO of the mid LAD 2. Continued patency of stents in the RCA 3. Normal LVEDP 4. Successful CTO PCI of the mid LAD with DES x 1   Plan: DAPT for one year. Patient is a candidate for same day discharge.      ASSESSMENT:    1. Coronary artery disease of native artery of native heart with stable angina pectoris ([place])   2. Hyperlipidemia associated with type 2 diabetes mellitus ([place])   3. Essential hypertension   4. Type 2 diabetes mellitus without complication, with long-term current use of insulin ([facility])       PLAN:  In order of problems listed above:  CAD: S/p remote BMS of RCA 2013. s/p DES  of mid LAD in March 2020.  Continue ASA 81 mg daily. On statin and beta blocker.   She is asymptomatic.   Hypertension: BP is well controlled.  Hyperlipidemia: Continue statin therapy, Crestor 20 mg daily. LDL is at goal 66  DM2: Managed by Dr Loanne Drilling. Last A1c 8.5%. On Trulicity  Hypothyroidism: On Synthroid. Last TSH 10.52 - per Dr Loanne Drilling.     Follow up in 6 months.  Medication Adjustments/Labs and Tests Ordered: Current medicines are reviewed at length with the patient today.  Concerns regarding  medicines are outlined above.  Medication changes, Labs and Tests ordered today are listed in the Patient Instructions below. There are no Patient Instructions on file for this visit.   Signed, Vitor Overbaugh Martinique, MD  02/13/2022 10:03 AM    [facility] [address], [place], [place]  [address] Phone: [phone]; Fax: [phone]

## 2022-02-13 ENCOUNTER — Encounter: Payer: Self-pay | Admitting: Cardiology

## 2022-02-13 ENCOUNTER — Ambulatory Visit (INDEPENDENT_AMBULATORY_CARE_PROVIDER_SITE_OTHER): Payer: Medicare Other | Admitting: Cardiology

## 2022-02-13 VITALS — BP 130/72 | HR 83 | Ht 63.0 in | Wt 144.4 lb

## 2022-02-13 DIAGNOSIS — I2510 Atherosclerotic heart disease of native coronary artery without angina pectoris: Secondary | ICD-10-CM | POA: Diagnosis not present

## 2022-02-13 DIAGNOSIS — E119 Type 2 diabetes mellitus without complications: Secondary | ICD-10-CM | POA: Diagnosis not present

## 2022-02-13 DIAGNOSIS — Z794 Long term (current) use of insulin: Secondary | ICD-10-CM

## 2022-02-13 DIAGNOSIS — E785 Hyperlipidemia, unspecified: Secondary | ICD-10-CM | POA: Diagnosis not present

## 2022-02-13 DIAGNOSIS — E1169 Type 2 diabetes mellitus with other specified complication: Secondary | ICD-10-CM

## 2022-02-13 DIAGNOSIS — I10 Essential (primary) hypertension: Secondary | ICD-10-CM | POA: Diagnosis not present

## 2022-02-13 DIAGNOSIS — I25118 Atherosclerotic heart disease of native coronary artery with other forms of angina pectoris: Secondary | ICD-10-CM

## 2022-02-27 ENCOUNTER — Other Ambulatory Visit: Payer: Self-pay | Admitting: Obstetrics and Gynecology

## 2022-02-27 DIAGNOSIS — Z1231 Encounter for screening mammogram for malignant neoplasm of breast: Secondary | ICD-10-CM

## 2022-04-20 ENCOUNTER — Ambulatory Visit: Payer: Medicare Other

## 2022-05-08 ENCOUNTER — Ambulatory Visit
Admission: RE | Admit: 2022-05-08 | Discharge: 2022-05-08 | Disposition: A | Discharge status: Home / Self Care | Payer: Medicare Other | Source: Ambulatory Visit | Attending: Obstetrics and Gynecology | Admitting: Obstetrics and Gynecology

## 2022-05-08 DIAGNOSIS — Z1231 Encounter for screening mammogram for malignant neoplasm of breast: Secondary | ICD-10-CM | POA: Diagnosis not present

## 2022-06-10 DIAGNOSIS — Z23 Encounter for immunization: Secondary | ICD-10-CM | POA: Diagnosis not present

## 2022-11-02 ENCOUNTER — Ambulatory Visit: Payer: Medicare Other | Admitting: Podiatry

## 2022-11-04 NOTE — Progress Notes (Unsigned)
Cardiology Office Note    Date:  11/05/2022   ID:  Alicia Medina, DOB Nov 10, 1932, MRN [id number]  PCP:  Pcp, No  Cardiologist:  Dr. Swaziland   Chief Complaint  Patient presents with   Follow-up   Coronary Artery Disease    History of Present Illness:  Alicia Medina is a 89 y.o. female with PMH of CAD, HTN, HLD, hypothyroidism, and DM II. patient had a inferior MI treated with BMS to RCA in 08/2011.  She required defibrillation multiple times and her hospitalization was complicated by cardiogenic shock, V. fib, VDRF, and complete heart block requiring temporary pacemaker.  Unfortunately, she had abrupt occlusion of the RCA stent and was treated with a second BMS to RCA.  Follow-up Myoview in March 2013 showed normal perfusion with EF of 76%.  Patient was seen in Feb 2020 with  intermittent chest pain radiating to her back between her shoulder blades.  Myoview showed moderate perfusion defect in the inferoapex, lateral apex, and and her apex that is new when compared to 2013.  The study was interpreted as intermediate risk with EF of 63%.  Patient ultimately underwent elective cardiac catheterization on 10/11/2018 which revealed a 100% proximal to mid LAD occlusion which was treated with 2.75 x 28 mm Synergy DES, widely patent mid RCA stent.   On follow up today she is doing well. She denies any significant chest pain or dyspnea. No palpitations.  She says she is still not convinced she has DM. Last A1c 8.5%. she reports she is using Trulicity once a week. She is not very active. Needs referral to endocrinology since Dr Everardo All retired. Does not have a PCP  Past Medical History:  Diagnosis Date   Arthritis    CAD (coronary artery disease) CARDIOLOGIST-  DR Swaz[place]and   08-28-2011 Inferior STEMI with BMS to the RCA, complicated by cardiogenic shock, VF, VDRF , temporary CHB and abrupt reocclusion of the RCA stent treated with a second BMS to the RCA. Has residual LAD disease. EF is 50%    Diverticulosis of colon    Hemorrhoids, internal, with bleeding    Hyperlipidemia    Hypertension    Hypothyroidism, postsurgical    S/p bare metal coronary artery stent    2013   Type 2 diabetes mellitus    Wears hearing aid    both ears    Past Surgical History:  Procedure Laterality Date   CARDIOVASCULAR STRESS TEST  10-03-2011  dr Swaziland   normal perfusion / no ischemia/ ef 77%   CARPAL TUNNEL RELEASE Right 01/25/2013   Procedure: CARPAL TUNNEL RELEASE;  Surgeon: Nicki Reaper, MD;  Location: [facility];  Service: Orthopedics;  Laterality: Right;   CARPAL TUNNEL RELEASE Left 10/25/2013   Procedure: LEFT CARPAL TUNNEL RELEASE;  Surgeon: Nicki Reaper, MD;  Location: [facility];  Service: Orthopedics;  Laterality: Left;   CATARACT EXTRACTION W/ INTRAOCULAR LENS  IMPLANT, BILATERAL  2007   COLONOSCOPY  04-27-2003   CORONARY ANGIOGRAM Left 08/28/2011   Procedure: CORONARY ANGIOGRAM;  Surgeon: Ameilia Rattan M Swaziland, MD;  Location: [facility] CATH LAB;  Service: Cardiovascular;  Laterality: Left;   CORONARY ANGIOPLASTY WITH STENT PLACEMENT  08-28-2011  dr [place]az[place]and   BMS to [facility] with abrupt reocclusion treated with second BMS/  LAD ostial 90%, proximal50-60%, first diagonal 80-90%   CORONARY STENT INTERVENTION N/A 10/11/2018   Procedure: CORONARY STENT INTERVENTION;  Surgeon: Sw[place]and, Dewaun Kinzler M, MD;  Location: [facility] INVASIVE CV  LAB;  Service: Cardiovascular;  Laterality: N/A;   LEFT HEART CATH AND CORONARY ANGIOGRAPHY N/A 10/11/2018   Procedure: LEFT HEART CATH AND CORONARY ANGIOGRAPHY;  Surgeon: SwazilandJordan, Cherika Jessie M, MD;  [facility];  Service: Cardiovascular;  Laterality: N/A;   LEFT HEART CATHETERIZATION WITH CORONARY ANGIOGRAM N/A 08/28/2011   Procedure: LEFT HEART CATHETERIZATION WITH CORONARY ANGIOGRAM;  Surgeon: Tonna Palazzi M SwazilandJordan, MD;  [facility];  Service: Cardiovascular;  Laterality: N/A;   MASS EXCISION Right 08/06/2020   Procedure: EXCISION MASS AND DEBRIDEMENT  PROXIMAL INTERPHALANGEAL JOINT RIGHT SMALL FINGER;  Surgeon: Cindee SaltKuzma, Gary, MD;  Location: [facility];  Service: Orthopedics;  Laterality: Right;  Bier block   PERCUTANEOUS CORONARY STENT INTERVENTION (PCI-S) Left 08/28/2011   Procedure: PERCUTANEOUS CORONARY STENT INTERVENTION (PCI-S);  Surgeon: Jakara Blatter M SwazilandJordan, MD;  [facility];  Service: Cardiovascular;  Laterality: Left;   TOTAL THYROIDECTOMY Bilateral 05-02-2007   multinodular goiter   TRANSANAL HEMORRHOIDAL DEARTERIALIZATION N/A 03/28/2014   Procedure: TRANSANAL HEMORRHOIDAL DEARTERIALIZATION;  Surgeon: Romie LeveeAlicia Thomas, MD;  [facility];  Service: General;  Laterality: N/A;   TRANSTHORACIC ECHOCARDIOGRAM  08-30-2011   mild LVH/ ef 50-55%/  grade I diastolic dysfunction   TRIGGER FINGER RELEASE Right 01/25/2013   Procedure: RELEASE TRIGGER FINGER/A-1 PULLEY RIGHT INDEX FINGER;  Surgeon: Nicki ReaperGary R Kuzma, MD;  Location: [facility];  Service: Orthopedics;  Laterality: Right;   TRIGGER FINGER RELEASE Left 10/25/2013   Procedure: RELEASE A-1 PULLEY LEFT MIDDLE FINGER;  Surgeon: Nicki ReaperGary R Kuzma, MD;  Location: [facility];  Service: Orthopedics;  Laterality: Left;   TRIGGER FINGER RELEASE Left 12/04/2014   Procedure: RELEASE TRIGGER FINGER/A-1 PULLEY LEFT RING FINGER;  Surgeon: Cindee SaltGary Kuzma, MD;  Location: [facility];  Service: Orthopedics;  Laterality: Left;   TRIGGER FINGER RELEASE Right 03/30/2017   Procedure: RELEASE TRIGGER FINGER/A-1 PULLEY RIGHT RING FINGER AND RIGHT SMALL FINGER;  Surgeon: Cindee SaltKuzma, Gary, MD;  Location: [facility];  Service: Orthopedics;  Laterality: Right;    Current Medications: Outpatient Medications Prior to Visit  Medication Sig Dispense Refill   acetaminophen (TYLENOL) 500 MG tablet Take 1,000 mg by mouth at bedtime.     Ascorbic Acid (VITAMIN C) 1000 MG tablet Take 1,000 mg by mouth every evening.     aspirin EC 81 MG tablet Take  81 mg by mouth daily.     Cholecalciferol (VITAMIN D-3) 125 MCG (5000 UT) TABS Take 5,000 Units by mouth daily.     glucose blood (TRUE METRIX BLOOD GLUCOSE TEST) test strip 1 each by Other route 2 (two) times daily. And lancets 2/day 200 each 3   loperamide (IMODIUM A-D) 2 MG tablet Take 1-2 mg by mouth as directed.     loratadine (CLARITIN) 10 MG tablet Take 10 mg by mouth at bedtime.     Dulaglutide (TRULICITY) 3 MG/0.5ML SOPN Inject 3 mg as directed once a week. 6 mL 3   losartan (COZAAR) 25 MG tablet TAKE 1 TABLET (25 MG TOTAL) BY MOUTH EVERY MORNING. 90 tablet 3   metoprolol tartrate (LOPRESSOR) 50 MG tablet TAKE 1 TABLET BY MOUTH 2 TIMES DAILY. 180 tablet 3   rosuvastatin (CRESTOR) 20 MG tablet TAKE 1 TABLET BY MOUTH DAILY. 90 tablet 3   SYNTHROID 175 MCG tablet TAKE 1 TABLET (175 MCG TOTAL) BY MOUTH DAILY BEFORE BREAKFAST. 90 tablet 1   Biotin w/ Vitamins C & E (HAIR/SKIN/NAILS PO) Take 1 tablet  by mouth 2 (two) times daily.     Magnesium 250 MG TABS Take 250 mg by mouth daily.     Multiple Vitamin (MULTIVITAMIN) tablet Take 1 tablet by mouth once a week.      Psyllium (METAMUCIL PO) Take 1 capsule by mouth daily.     No facility-administered medications prior to visit.     Allergies:   Demeclocycline, Lisinopril, and Tetracyclines & related   Social History   Socioeconomic History   Marital status: Married    Spouse name: Not on file   Number of children: Not on file   Years of education: Not on file   Highest education level: Not on file  Occupational History   Not on file  Tobacco Use   Smoking status: Never   Smokeless tobacco: Never  Vaping Use   Vaping Use: Never used  Substance and Sexual Activity   Alcohol use: Yes    Alcohol/week: 1.0 standard drink of alcohol    Types: 1 Glasses of wine per week    Comment: social   Drug use: No   Sexual activity: Not on file  Other Topics Concern   Not on file  Social History Narrative   Lives with husband.   Has 2  sons- both healthy.   Younger son lives in [place], [place] in order son lives in [place].   Social Determinants of Health   Financial Resource Strain: Not on file  Food Insecurity: Not on file  Transportation Needs: Not on file  Physical Activity: Not on file  Stress: Not on file  Social Connections: Not on file     Family History:  The patient's family history includes Cancer in her father and mother; Stroke in her father and mother.   ROS:   Please see the history of present illness.    ROS All other systems reviewed and are negative.   PHYSICAL EXAM:   VS:  BP 128/70   Pulse 86   Ht 5\' 3"  (1.6 m)   Wt 150 lb (68 kg)   SpO2 95%   BMI 26.57 kg/m    GEN: Well nourished, well developed, in no acute distress  HEENT: normal  Neck: no JVD, carotid bruits, or masses Cardiac: RRR; no murmurs, rubs, or gallops,no edema  Respiratory:  clear to auscultation bilaterally, normal work of breathing GI: soft, nontender, nondistended, + BS MS: no deformity or atrophy  Skin: warm and dry, no rash. Abrasion on left shin Neuro:  Alert and Oriented x 3, Strength and sensation are intact Psych: euthymic mood, full affect  Wt Readings from Last 3 Encounters:  11/05/22 150 lb (68 kg)  02/13/22 144 lb 6.4 oz (65.5 kg)  10/01/21 142 lb 6.4 oz (64.6 kg)      Studies/Labs Reviewed:   EKG:  EKG is  ordered today. NSR rate 86. Low voltage. Nonspecific TWA. No change. I have personally reviewed and interpreted this study.    Recent Labs: No results found for requested labs within last 365 days.  Dated 10/01/20: A1c 7.5%.  Dated 07/02/21: A1c 8.7% Dated 10/01/21: 8.5%.   Lipid Panel    Component Value Date/Time   CHOL 128 08/12/2021 1120   TRIG 108 08/12/2021 1120   HDL 42 08/12/2021 1120   CHOLHDL 3.0 08/12/2021 1120   CHOLHDL 2.6 12/10/2016 0954   VLDL 26 12/10/2016 0954   LDLCALC 66 08/12/2021 1120    Additional studies/ records that were reviewed today include:   Myoview  09/22/18:  Study Highlights    The left ventricular ejection fraction is normal (55-65%). Nuclear stress EF: 63%. There was no ST segment deviation noted during stress. Defect 1: There is a medium size severe perfusion defect present in the apical anterior, apical inferior, apical lateral and apex location. Defect 2: There is a small defect of severe severity present in the mid anterior location. Findings consistent with prior myocardial infarction. This is an intermediate risk study.   Medium size severe perfusion defect in the inferoapex, lateral apex and anteroapex, fixed without evidence of ischemia.  Small fixed perfusion defect in the anterior wall at the mid ventricle.   Evidence of infarction without evidence of ischemia.     Cath 10/11/2018 Prox LAD to Mid LAD lesion is 100% stenosed. Previously placed Mid RCA stent (unknown type) is widely patent. Post intervention, there is a 0% residual stenosis. A drug-eluting stent was successfully placed using a STENT SYNERGY DES [id number]. LV end diastolic pressure is normal.   1. Single vessel occlusive CAD with CTO of the mid LAD 2. Continued patency of stents in the RCA 3. Normal LVEDP 4. Successful CTO PCI of the mid LAD with DES x 1   Plan: DAPT for one year. Patient is a candidate for same day discharge.      ASSESSMENT:    1. Coronary artery disease involving native coronary artery of native heart without angina pectoris   2. Hyperlipidemia associated with type 2 diabetes mellitus   3. Essential hypertension   4. Type 2 diabetes mellitus without complication, with long-term current use of insulin   5. Hypothyroidism, postsurgical       PLAN:  In order of problems listed above:  CAD: S/p remote BMS of RCA 2013. s/p DES of mid LAD in March 2020.  Continue ASA 81 mg daily. On statin and beta blocker.   She is asymptomatic.   Hypertension: BP is well controlled.  Hyperlipidemia: Continue statin therapy, Crestor 20 mg  daily. Will update labs  DM2:  Last A1c 8.5%. On Trulicity. Will refer to endocrinology.   Hypothyroidism: On Synthroid. Will refill for now. Check TFTs    Follow up in one year  Medication Adjustments/Labs and Tests Ordered: Current medicines are reviewed at length with the patient today.  Concerns regarding medicines are outlined above.  Medication changes, Labs and Tests ordered today are listed in the Patient Instructions below. Patient Instructions  Medication Instructions:  NO CHANGES Your medications have been refilled  *If you need a refill on your cardiac medications before your next appointment, please call your pharmacy*   Lab Work: FASTING lab work  - lipid panel, A1c, CBC, CMET, TSH, Free T4  If you have labs (blood work) drawn today and your tests are completely normal, you will receive your results only by: MyChart Message (if you have MyChart) OR A paper copy in the mail If you have any lab test that is abnormal or we need to change your treatment, we will call you to review the results.   Follow-Up: At [facility], you and your health needs are our priority.  As part of our continuing mission to provide you with exceptional heart care, we have created designated Provider Care Teams.  These Care Teams include your primary Cardiologist (physician) and Advanced Practice Providers (APPs -  Physician Assistants and Nurse Practitioners) who all work together to provide you with the care you need, when you need it.  We recommend signing up for the patient  portal called "MyChart".  Sign up information is provided on this After Visit Summary.  MyChart is used to connect with patients for Virtual Visits (Telemedicine).  Patients are able to view lab/test results, encounter notes, upcoming appointments, etc.  Non-urgent messages can be sent to your provider as well.   To learn more about what you can do with MyChart, go to [email or web address].    Your next  appointment:    12 months with Dr. Swaziland  You have been referred to Dr. Elvera Lennox - endocrinologist      Signed, Soley Harriss Swaziland, MD  11/05/2022 3:45 PM    [facility] [address] [place], [place], [place]  [address] Phone: [phone]; Fax: [phone]

## 2022-11-05 ENCOUNTER — Encounter: Payer: Self-pay | Admitting: Cardiology

## 2022-11-05 ENCOUNTER — Ambulatory Visit: Payer: Medicare Other | Attending: Cardiology | Admitting: Cardiology

## 2022-11-05 VITALS — BP 128/70 | HR 86 | Ht 63.0 in | Wt 150.0 lb

## 2022-11-05 DIAGNOSIS — I10 Essential (primary) hypertension: Secondary | ICD-10-CM | POA: Insufficient documentation

## 2022-11-05 DIAGNOSIS — E1169 Type 2 diabetes mellitus with other specified complication: Secondary | ICD-10-CM | POA: Diagnosis present

## 2022-11-05 DIAGNOSIS — E890 Postprocedural hypothyroidism: Secondary | ICD-10-CM | POA: Insufficient documentation

## 2022-11-05 DIAGNOSIS — Z794 Long term (current) use of insulin: Secondary | ICD-10-CM | POA: Insufficient documentation

## 2022-11-05 DIAGNOSIS — E119 Type 2 diabetes mellitus without complications: Secondary | ICD-10-CM | POA: Diagnosis present

## 2022-11-05 DIAGNOSIS — I2510 Atherosclerotic heart disease of native coronary artery without angina pectoris: Secondary | ICD-10-CM | POA: Diagnosis present

## 2022-11-05 DIAGNOSIS — E785 Hyperlipidemia, unspecified: Secondary | ICD-10-CM | POA: Diagnosis present

## 2022-11-05 MED ORDER — METOPROLOL TARTRATE 50 MG PO TABS: 50.0000 mg | ORAL_TABLET | Freq: Two times a day (BID) | ORAL | 3 refills | Status: DC

## 2022-11-05 MED ORDER — SYNTHROID 175 MCG PO TABS: 175.0000 ug | ORAL_TABLET | Freq: Every day | ORAL | 1 refills | Status: DC

## 2022-11-05 MED ORDER — ROSUVASTATIN CALCIUM 20 MG PO TABS: 20.0000 mg | ORAL_TABLET | Freq: Every day | ORAL | 3 refills | Status: DC

## 2022-11-05 MED ORDER — LOSARTAN POTASSIUM 25 MG PO TABS: 25.0000 mg | ORAL_TABLET | Freq: Every morning | ORAL | 3 refills | Status: DC

## 2022-11-05 MED ORDER — TRULICITY 3 MG/0.5ML ~~LOC~~ SOAJ: 3.0000 mg | SUBCUTANEOUS | 3 refills | Status: DC

## 2022-11-05 NOTE — Patient Instructions (Addendum)
Medication Instructions:  NO CHANGES Your medications have been refilled  *If you need a refill on your cardiac medications before your next appointment, please call your pharmacy*   Lab Work: FASTING lab work  - lipid panel, A1c, CBC, CMET, TSH, Free T4  If you have labs (blood work) drawn today and your tests are completely normal, you will receive your results only by: MyChart Message (if you have MyChart) OR A paper copy in the mail If you have any lab test that is abnormal or we need to change your treatment, we will call you to review the results.   Follow-Up: At [facility], you and your health needs are our priority.  As part of our continuing mission to provide you with exceptional heart care, we have created designated Provider Care Teams.  These Care Teams include your primary Cardiologist (physician) and Advanced Practice Providers (APPs -  Physician Assistants and Nurse Practitioners) who all work together to provide you with the care you need, when you need it.  We recommend signing up for the patient portal called "MyChart".  Sign up information is provided on this After Visit Summary.  MyChart is used to connect with patients for Virtual Visits (Telemedicine).  Patients are able to view lab/test results, encounter notes, upcoming appointments, etc.  Non-urgent messages can be sent to your provider as well.   To learn more about what you can do with MyChart, go to [email or web address].    Your next appointment:    12 months with Dr. Swaziland  You have been referred to Dr. Elvera Lennox - endocrinologist

## 2022-11-06 LAB — CBC
Hematocrit: 38.6 % (ref 34.0–46.6)
Hemoglobin: 12.7 g/dL (ref 11.1–15.9)
MCH: 30.6 pg (ref 26.6–33.0)
MCHC: 32.9 g/dL (ref 31.5–35.7)
MCV: 93 fL (ref 79–97)
Platelets: 289 10*3/uL (ref 150–450)
RBC: 4.15 x10E6/uL (ref 3.77–5.28)
RDW: 13.5 % (ref 11.7–15.4)
WBC: 11.6 10*3/uL — ABNORMAL HIGH (ref 3.4–10.8)

## 2022-11-06 LAB — COMPREHENSIVE METABOLIC PANEL
ALT: 92 IU/L — ABNORMAL HIGH (ref 0–32)
AST: 51 IU/L — ABNORMAL HIGH (ref 0–40)
Albumin/Globulin Ratio: 1.6 (ref 1.2–2.2)
Albumin: 4.5 g/dL (ref 3.7–4.7)
Alkaline Phosphatase: 63 IU/L (ref 44–121)
BUN/Creatinine Ratio: 16 (ref 12–28)
BUN: 19 mg/dL (ref 8–27)
Bilirubin Total: 0.5 mg/dL (ref 0.0–1.2)
CO2: 22 mmol/L (ref 20–29)
Calcium: 9.6 mg/dL (ref 8.7–10.3)
Chloride: 95 mmol/L — ABNORMAL LOW (ref 96–106)
Creatinine, Ser: 1.2 mg/dL — ABNORMAL HIGH (ref 0.57–1.00)
Globulin, Total: 2.8 g/dL (ref 1.5–4.5)
Glucose: 317 mg/dL — ABNORMAL HIGH (ref 70–99)
Potassium: 4.2 mmol/L (ref 3.5–5.2)
Sodium: 136 mmol/L (ref 134–144)
Total Protein: 7.3 g/dL (ref 6.0–8.5)
eGFR: 44 mL/min/{1.73_m2} — ABNORMAL LOW (ref >59)

## 2022-11-06 LAB — LIPID PANEL
Chol/HDL Ratio: 2.7 ratio (ref 0.0–4.4)
Cholesterol, Total: 171 mg/dL (ref 100–199)
HDL: 63 mg/dL (ref >39)
LDL Chol Calc (NIH): 51 mg/dL (ref 0–99)
Triglycerides: 381 mg/dL — ABNORMAL HIGH (ref 0–149)
VLDL Cholesterol Cal: 57 mg/dL — ABNORMAL HIGH (ref 5–40)

## 2022-11-06 LAB — HEMOGLOBIN A1C
Est. average glucose Bld gHb Est-mCnc: 258 mg/dL
Hgb A1c MFr Bld: 10.6 % — ABNORMAL HIGH (ref 4.8–5.6)

## 2022-11-06 LAB — T4, FREE: Free T4: 0.14 ng/dL — ABNORMAL LOW (ref 0.82–1.77)

## 2022-11-06 LAB — TSH: TSH: 77.5 u[IU]/mL — ABNORMAL HIGH (ref 0.450–4.500)

## 2022-11-16 ENCOUNTER — Encounter: Payer: Self-pay | Admitting: Podiatry

## 2022-11-16 ENCOUNTER — Ambulatory Visit (INDEPENDENT_AMBULATORY_CARE_PROVIDER_SITE_OTHER): Payer: Medicare Other | Admitting: Podiatry

## 2022-11-16 DIAGNOSIS — R197 Diarrhea, unspecified: Secondary | ICD-10-CM | POA: Insufficient documentation

## 2022-11-16 DIAGNOSIS — E1149 Type 2 diabetes mellitus with other diabetic neurological complication: Secondary | ICD-10-CM | POA: Diagnosis not present

## 2022-11-16 DIAGNOSIS — R194 Change in bowel habit: Secondary | ICD-10-CM | POA: Insufficient documentation

## 2022-11-16 DIAGNOSIS — F419 Anxiety disorder, unspecified: Secondary | ICD-10-CM | POA: Insufficient documentation

## 2022-11-16 MED ORDER — GABAPENTIN 100 MG PO CAPS
100.0000 mg | ORAL_CAPSULE | Freq: Every day | ORAL | 0 refills | Status: DC
Start: 2022-11-16 — End: 2023-10-05

## 2022-11-16 NOTE — Progress Notes (Unsigned)
Subjective: Chief Complaint  Patient presents with   Diabetes    np  diabetic with discomfort in both feet. Feels like feet are swelling/ req Dr Ardelle Anton   A few weeks ago it felt like they were "tight". No injuries or falls that affected her feet. No recent treatment. She states there is kind of of a numbness on both feet. She states she she is not convinced she is diabetic.   Last A1c 10.6 on 11/05/2022    Objective: AAO x3, NAD DP/PT pulses palpable bilaterally, CRT less than 3 seconds  No pain with calf compression, swelling, warmth, erythema  Assessment:  Plan: -All treatment options discussed with the patient including all alternatives, risks, complications.   -Patient encouraged to call the office with any questions, concerns, change in symptoms.

## 2022-11-16 NOTE — Patient Instructions (Signed)
You can use capsaicin cream or Aspercreme with lidocaine --  Gabapentin Capsules or Tablets What is this medication? GABAPENTIN (GA ba pen tin) treats nerve pain. It may also be used to prevent and control seizures in people with epilepsy. It works by calming overactive nerves in your body. This medicine may be used for other purposes; ask your health care provider or pharmacist if you have questions. COMMON BRAND NAME(S): Active-PAC with Gabapentin, Ascencion Dike, Gralise, Neurontin What should I tell my care team before I take this medication? They need to know if you have any of these conditions: Alcohol or substance use disorder Kidney disease Lung or breathing disease Suicidal thoughts, plans, or attempt; a previous suicide attempt by you or a family member An unusual or allergic reaction to gabapentin, other medications, foods, dyes, or preservatives Pregnant or trying to get pregnant Breast-feeding How should I use this medication? Take this medication by mouth with a glass of water. Follow the directions on the prescription label. You can take it with or without food. If it upsets your stomach, take it with food. Take your medication at regular intervals. Do not take it more often than directed. Do not stop taking except on your care team's advice. If you are directed to break the 600 or 800 mg tablets in half as part of your dose, the extra half tablet should be used for the next dose. If you have not used the extra half tablet within 28 days, it should be thrown away. A special MedGuide will be given to you by the pharmacist with each prescription and refill. Be sure to read this information carefully each time. Talk to your care team about the use of this medication in children. While this medication may be prescribed for children as young as 3 years for selected conditions, precautions do apply. Overdosage: If you think you have taken too much of this medicine contact a poison control  center or emergency room at once. NOTE: This medicine is only for you. Do not share this medicine with others. What if I miss a dose? If you miss a dose, take it as soon as you can. If it is almost time for your next dose, take only that dose. Do not take double or extra doses. What may interact with this medication? Alcohol Antihistamines for allergy, cough, and cold Certain medications for anxiety or sleep Certain medications for depression like amitriptyline, fluoxetine, sertraline Certain medications for seizures like phenobarbital, primidone Certain medications for stomach problems General anesthetics like halothane, isoflurane, methoxyflurane, propofol Local anesthetics like lidocaine, pramoxine, tetracaine Medications that relax muscles for surgery Opioid medications for pain Phenothiazines like chlorpromazine, mesoridazine, prochlorperazine, thioridazine This list may not describe all possible interactions. Give your health care provider a list of all the medicines, herbs, non-prescription drugs, or dietary supplements you use. Also tell them if you smoke, drink alcohol, or use illegal drugs. Some items may interact with your medicine. What should I watch for while using this medication? Visit your care team for regular checks on your progress. You may want to keep a record at home of how you feel your condition is responding to treatment. You may want to share this information with your care team at each visit. You should contact your care team if your seizures get worse or if you have any new types of seizures. Do not stop taking this medication or any of your seizure medications unless instructed by your care team. Stopping your medication suddenly can increase  your seizures or their severity. This medication may cause serious skin reactions. They can happen weeks to months after starting the medication. Contact your care team right away if you notice fevers or flu-like symptoms with a  rash. The rash may be red or purple and then turn into blisters or peeling of the skin. Or, you might notice a red rash with swelling of the face, lips or lymph nodes in your neck or under your arms. Wear a medical identification bracelet or chain if you are taking this medication for seizures. Carry a card that lists all your medications. This medication may affect your coordination, reaction time, or judgment. Do not drive or operate machinery until you know how this medication affects you. Sit up or stand slowly to reduce the risk of dizzy or fainting spells. Drinking alcohol with this medication can increase the risk of these side effects. Your mouth may get dry. Chewing sugarless gum or sucking hard candy, and drinking plenty of water may help. Watch for new or worsening thoughts of suicide or depression. This includes sudden changes in mood, behaviors, or thoughts. These changes can happen at any time but are more common in the beginning of treatment or after a change in dose. Call your care team right away if you experience these thoughts or worsening depression. If you become pregnant while using this medication, you may enroll in the [place] American Antiepileptic Drug Pregnancy Registry by calling [phone]. This registry collects information about the safety of antiepileptic medication use during pregnancy. What side effects may I notice from receiving this medication? Side effects that you should report to your care team as soon as possible: Allergic reactions or angioedema--skin rash, itching, hives, swelling of the face, eyes, lips, tongue, arms, or legs, trouble swallowing or breathing Rash, fever, and swollen lymph nodes Thoughts of suicide or self harm, worsening mood, feelings of depression Trouble breathing Unusual changes in mood or behavior in children after use such as difficulty concentrating, hostility, or restlessness Side effects that usually do not require medical attention  (report to your care team if they continue or are bothersome): Dizziness Drowsiness Nausea Swelling of ankles, feet, or hands Vomiting This list may not describe all possible side effects. Call your doctor for medical advice about side effects. You may report side effects to FDA at 1-800-FDA-1088. Where should I keep my medication? Keep out of reach of children and pets. Store at room temperature between 15 and 30 degrees C (59 and 86 degrees F). Get rid of any unused medication after the expiration date. This medication may cause accidental overdose and death if taken by other adults, children, or pets. To get rid of medications that are no longer needed or have expired: Take the medication to a medication take-back program. Check with your pharmacy or law enforcement to find a location. If you cannot return the medication, check the label or package insert to see if the medication should be thrown out in the garbage or flushed down the toilet. If you are not sure, ask your care team. If it is safe to put it in the trash, empty the medication out of the container. Mix the medication with cat litter, dirt, coffee grounds, or other unwanted substance. Seal the mixture in a bag or container. Put it in the trash. NOTE: This sheet is a summary. It may not cover all possible information. If you have questions about this medicine, talk to your doctor, pharmacist, or health care provider.  2023  Elsevier/Gold Standard (2021-01-13 00:00:00)

## 2022-11-18 NOTE — Progress Notes (Signed)
Subjective:   Patient ID: Alicia Medina, female   DOB: 89 y.o.   MRN: [id number]   HPI Chief Complaint  Patient presents with   Diabetes    np  diabetic with discomfort in both feet. Feels like feet are swelling/ req Dr Ardelle Anton   89 year old female presents the office with above concerns.  A few weeks ago it felt like they were "tight". No injuries or falls that affected her feet. No recent treatment. She states there is kind of of a numbness on both feet. She states she she is not convinced she is diabetic.  No open lesions.  No other concerns.  Last A1c 10.6 on 11/05/2022   Review of Systems  All other systems reviewed and are negative.  Past Medical History:  Diagnosis Date   Arthritis    CAD (coronary artery disease) CARDIOLOGIST-  DR [place]   08-28-2011 Inferior STEMI with BMS to the RCA, complicated by cardiogenic shock, VF, VDRF , temporary CHB and abrupt reocclusion of the RCA stent treated with a second BMS to the RCA. Has residual LAD disease. EF is 50%   Diverticulosis of colon    Hemorrhoids, internal, with bleeding    Hyperlipidemia    Hypertension    Hypothyroidism, postsurgical    S/p bare metal coronary artery stent    2013   Type 2 diabetes mellitus    Wears hearing aid    both ears    Past Surgical History:  Procedure Laterality Date   CARDIOVASCULAR STRESS TEST  10-03-2011  dr [place]   normal perfusion / no ischemia/ ef 77%   CARPAL TUNNEL RELEASE Right 01/25/2013   Procedure: CARPAL TUNNEL RELEASE;  Surgeon: Nicki Reaper, MD;  Location: [facility];  Service: Orthopedics;  Laterality: Right;   CARPAL TUNNEL RELEASE Left 10/25/2013   Procedure: LEFT CARPAL TUNNEL RELEASE;  Surgeon: Nicki Reaper, MD;  Location: [facility];  Service: Orthopedics;  Laterality: Left;   CATARACT EXTRACTION W/ INTRAOCULAR LENS  IMPLANT, BILATERAL  2007   COLONOSCOPY  04-27-2003   CORONARY ANGIOGRAM Left 08/28/2011   Procedure: CORONARY ANGIOGRAM;   Surgeon: Peter M [place], MD;  Location: [facility] CATH LAB;  Service: Cardiovascular;  Laterality: Left;   CORONARY ANGIOPLASTY WITH STENT PLACEMENT  08-28-2011  dr [place]   BMS to [facility] with abrupt reocclusion treated with second BMS/  LAD ostial 90%, proximal50-60%, first diagonal 80-90%   CORONARY STENT INTERVENTION N/A 10/11/2018   Procedure: CORONARY STENT INTERVENTION;  Surgeon: Sw[place], Peter M, MD;  Location: [facility] INVASIVE CV LAB;  Service: Cardiovascular;  Laterality: N/A;   LEFT HEART CATH AND CORONARY ANGIOGRAPHY N/A 10/11/2018   Procedure: LEFT HEART CATH AND CORONARY ANGIOGRAPHY;  Surgeon: Swaz[place], Peter M, MD;  Location: [facility] INVASIVE CV LAB;  Service: Cardiovascular;  Laterality: N/A;   LEFT HEART CATHETERIZATION WITH CORONARY ANGIOGRAM N/A 08/28/2011   Procedure: LEFT HEART CATHETERIZATION WITH CORONARY ANGIOGRAM;  Surgeon: Peter M [place], MD;  Location: [facility] CATH LAB;  Service: Cardiovascular;  Laterality: N/A;   MASS EXCISION Right 08/06/2020   Procedure: EXCISION MASS AND DEBRIDEMENT PROXIMAL INTERPHALANGEAL JOINT RIGHT SMALL FINGER;  Surgeon: Cindee Salt, MD;  Location: [facility];  Service: Orthopedics;  Laterality: Right;  Bier block   PERCUTANEOUS CORONARY STENT INTERVENTION (PCI-S) Left 08/28/2011   Procedure: PERCUTANEOUS CORONARY STENT INTERVENTION (PCI-S);  Surgeon: Peter M [place], MD;  Location: [facility] CATH LAB;  Service: Cardiovascular;  Laterality: Left;   TOTAL THYROIDECTOMY Bilateral 05-02-2007  multinodular goiter   TRANSANAL HEMORRHOIDAL DEARTERIALIZATION N/A 03/28/2014   Procedure: TRANSANAL HEMORRHOIDAL DEARTERIALIZATION;  Surgeon: Romie Levee, MD;  Location: [facility];  Service: General;  Laterality: N/A;   TRANSTHORACIC ECHOCARDIOGRAM  08-30-2011   mild LVH/ ef 50-55%/  grade I diastolic dysfunction   TRIGGER FINGER RELEASE Right 01/25/2013   Procedure: RELEASE TRIGGER FINGER/A-1 PULLEY RIGHT INDEX FINGER;  Surgeon: Nicki Reaper, MD;  Location: [facility];  Service: Orthopedics;  Laterality: Right;   TRIGGER FINGER RELEASE Left 10/25/2013   Procedure: RELEASE A-1 PULLEY LEFT MIDDLE FINGER;  Surgeon: Nicki Reaper, MD;  Location: [facility];  Service: Orthopedics;  Laterality: Left;   TRIGGER FINGER RELEASE Left 12/04/2014   Procedure: RELEASE TRIGGER FINGER/A-1 PULLEY LEFT RING FINGER;  Surgeon: Cindee Salt, MD;  Location: [facility];  Service: Orthopedics;  Laterality: Left;   TRIGGER FINGER RELEASE Right 03/30/2017   Procedure: RELEASE TRIGGER FINGER/A-1 PULLEY RIGHT RING FINGER AND RIGHT SMALL FINGER;  Surgeon: Cindee Salt, MD;  Location: [facility];  Service: Orthopedics;  Laterality: Right;     Current Outpatient Medications:    calcium carbonate (SUPER CALCIUM) 1500 (600 Ca) MG TABS tablet, Take by mouth., Disp: , Rfl:    clopidogrel (PLAVIX) 75 MG tablet, clopidogrel 75 mg tablet TAKE 1 TABLET (75 MG TOTAL) BY MOUTH DAILY., Disp: , Rfl:    Cyanocobalamin 2500 MCG TABS, Take by mouth., Disp: , Rfl:    gabapentin (NEURONTIN) 100 MG capsule, Take 1 capsule (100 mg total) by mouth at bedtime., Disp: 90 capsule, Rfl: 0   insulin degludec (TRESIBA FLEXTOUCH) 100 UNIT/ML FlexTouch Pen, Inject into the skin., Disp: , Rfl:    Insulin NPH, Human,, Isophane, (NOVOLIN N FLEXPEN) 100 UNIT/ML Kiwkpen, 15 Units., Disp: , Rfl:    metFORMIN (GLUCOPHAGE) 500 MG tablet, Take by mouth., Disp: , Rfl:    potassium chloride SA (KLOR-CON M) 20 MEQ tablet, Take by mouth., Disp: , Rfl:    S-Adenosylmethionine 400 MG TABS, Take by mouth., Disp: , Rfl:    traMADol (ULTRAM) 50 MG tablet, Take by mouth., Disp: , Rfl:    Vitamin E 500 UNIT/GM POWD, Take by mouth., Disp: , Rfl:    acetaminophen (TYLENOL) 500 MG tablet, Take 1,000 mg by mouth at bedtime., Disp: , Rfl:    Ascorbic Acid (VITAMIN C) 1000 MG tablet, Take 1,000 mg by mouth every evening., Disp: , Rfl:    aspirin EC 81 MG tablet, Take 81 mg by mouth  daily., Disp: , Rfl:    Cholecalciferol (VITAMIN D-3) 125 MCG (5000 UT) TABS, Take 5,000 Units by mouth daily., Disp: , Rfl:    Dulaglutide (TRULICITY) 3 MG/0.5ML SOPN, Inject 3 mg as directed once a week., Disp: 6 mL, Rfl: 3   glucose blood (TRUE METRIX BLOOD GLUCOSE TEST) test strip, 1 each by Other route 2 (two) times daily. And lancets 2/day, Disp: 200 each, Rfl: 3   loperamide (IMODIUM A-D) 2 MG tablet, Take 1-2 mg by mouth as directed., Disp: , Rfl:    loratadine (CLARITIN) 10 MG tablet, Take 10 mg by mouth at bedtime., Disp: , Rfl:    losartan (COZAAR) 25 MG tablet, Take 1 tablet (25 mg total) by mouth every morning., Disp: 90 tablet, Rfl: 3   metoprolol tartrate (LOPRESSOR) 50 MG tablet, Take 1 tablet (50 mg total) by mouth 2 (two) times daily., Disp: 180 tablet, Rfl: 3   rosuvastatin (CRESTOR) 20 MG tablet,  Take 1 tablet (20 mg total) by mouth daily., Disp: 90 tablet, Rfl: 3   Semaglutide,0.25 or 0.5MG /DOS, (OZEMPIC, 0.25 OR 0.5 MG/DOSE,) 2 MG/1.5ML SOPN, INJECT 0.5MG  INTO THE SKIN ONCE A WEEK, Disp: , Rfl:    SYNTHROID 175 MCG tablet, Take 1 tablet (175 mcg total) by mouth daily before breakfast., Disp: 90 tablet, Rfl: 1  Allergies  Allergen Reactions   Demeclocycline Other (See Comments)    Severe stomach cramps Severe stomach cramps Severe stomach cramps   Lisinopril Cough   Tetracyclines & Related Other (See Comments)    Severe stomach cramps          Objective:  Physical Exam  General: AAO x3, NAD  Dermatological: Skin is warm, dry and supple bilateral. There are no open sores, no preulcerative lesions, no rash or signs of infection present.  Vascular: Dorsalis Pedis artery and Posterior Tibial artery pedal pulses are palpable bilateral with immedate capillary fill time.  There is no pain with calf compression, swelling, warmth, erythema.   Neruologic: Overall sensation appears intact with Semmes Weinstein monofilament.  Negative Tinel sign.  Musculoskeletal: Unable  to appreciate any area pinpoint tenderness.  No areas of discomfort noted bilaterally.  There is no edema, erythema.  MMT 5/5.  Gait: Unassisted, Nonantalgic.       Assessment:   89 year old female with likely neuropathy symptoms, uncontrolled diabetes     Plan:  -Treatment options discussed including all alternatives, risks, and complications -Etiology of symptoms were discussed -Follow-up on x-ray states she is not having any pain and no recent injuries.  He think her symptoms are more consistent with neuropathy.  We discussed that given her increased A1c she is diabetic and that this is likely contributed to the nerve symptoms, neuropathy.  Discussed blood sugar control is important to help prevent worsening.  Will start gabapentin 100 mg nightly and titrate up as needed.  Discussed side effects.  Vivi Barrack DPM

## 2022-11-27 ENCOUNTER — Ambulatory Visit: Payer: Medicare Other | Admitting: Cardiology

## 2023-03-18 ENCOUNTER — Telehealth: Payer: Self-pay | Admitting: Endocrinology

## 2023-03-18 NOTE — Telephone Encounter (Signed)
Patient asking for a Trulicity refill till her next appointment. Please call into  [facility] Dr. Ginette Otto

## 2023-03-18 NOTE — Telephone Encounter (Signed)
Tried this pt on multiple time phone isn't work, pt will need to get refill from pcp pt hasn't seen in over a year.last ov 03/23

## 2023-03-18 NOTE — Telephone Encounter (Signed)
Called patient back unable to reach pt.,

## 2023-03-19 ENCOUNTER — Telehealth: Payer: Self-pay | Admitting: Endocrinology

## 2023-03-19 MED ORDER — TRULICITY 3 MG/0.5ML ~~LOC~~ SOAJ: 3.0000 mg | SUBCUTANEOUS | 0 refills | Status: DC

## 2023-03-19 NOTE — Telephone Encounter (Signed)
Patient is out of Trulicity and need a refill, Patient has an appointment Oct 7th with Dr.Thapa

## 2023-03-19 NOTE — Telephone Encounter (Signed)
Requested Prescriptions   Signed Prescriptions Disp Refills   Dulaglutide (TRULICITY) 3 MG/0.5ML SOPN 6 mL 0    Sig: Inject 3 mg as directed once a week.    Authorizing Provider: THAPA, [place]    Ordering User: Pollie Meyer   Rx sent

## 2023-03-23 ENCOUNTER — Encounter: Payer: Self-pay | Admitting: Podiatry

## 2023-03-23 ENCOUNTER — Ambulatory Visit: Payer: Medicare Other | Admitting: Podiatry

## 2023-03-23 DIAGNOSIS — M79675 Pain in left toe(s): Secondary | ICD-10-CM | POA: Diagnosis not present

## 2023-03-23 DIAGNOSIS — M79674 Pain in right toe(s): Secondary | ICD-10-CM

## 2023-03-23 DIAGNOSIS — B351 Tinea unguium: Secondary | ICD-10-CM

## 2023-03-23 DIAGNOSIS — E1149 Type 2 diabetes mellitus with other diabetic neurological complication: Secondary | ICD-10-CM

## 2023-03-23 DIAGNOSIS — R2681 Unsteadiness on feet: Secondary | ICD-10-CM | POA: Diagnosis not present

## 2023-03-23 MED ORDER — GABAPENTIN 100 MG PO CAPS: 100.0000 mg | ORAL_CAPSULE | Freq: Two times a day (BID) | ORAL | 0 refills | Status: DC

## 2023-03-23 NOTE — Patient Instructions (Addendum)
You can use ASPERCREME or BIOFREEZE or VOLTAREN GEL on the feet to help  --  I have put in a referral to physical therapy to help as well.   --  Gabapentin Capsules or Tablets What is this medication? GABAPENTIN (GA ba pen tin) treats nerve pain. It may also be used to prevent and control seizures in people with epilepsy. It works by calming overactive nerves in your body. This medicine may be used for other purposes; ask your health care provider or pharmacist if you have questions. COMMON BRAND NAME(S): Active-PAC with Gabapentin, Ascencion Dike, Gralise, Neurontin What should I tell my care team before I take this medication? They need to know if you have any of these conditions: Kidney disease Lung or breathing disease Substance use disorder Suicidal thoughts, plans, or attempt by you or a family member An unusual or allergic reaction to gabapentin, other medications, foods, dyes, or preservatives Pregnant or trying to get pregnant Breastfeeding How should I use this medication? Take this medication by mouth with a glass of water. Follow the directions on the prescription label. You can take it with or without food. If it upsets your stomach, take it with food. Take your medication at regular intervals. Do not take it more often than directed. Do not stop taking except on your care team's advice. If you are directed to break the 600 or 800 mg tablets in half as part of your dose, the extra half tablet should be used for the next dose. If you have not used the extra half tablet within 28 days, it should be thrown away. A special MedGuide will be given to you by the pharmacist with each prescription and refill. Be sure to read this information carefully each time. Talk to your care team about the use of this medication in children. While this medication may be prescribed for children as young as 3 years for selected conditions, precautions do apply. Overdosage: If you think you have taken too  much of this medicine contact a poison control center or emergency room at once. NOTE: This medicine is only for you. Do not share this medicine with others. What if I miss a dose? If you miss a dose, take it as soon as you can. If it is almost time for your next dose, take only that dose. Do not take double or extra doses. What may interact with this medication? Alcohol Antihistamines for allergy, cough, and cold Certain medications for anxiety or sleep Certain medications for depression like amitriptyline, fluoxetine, sertraline Certain medications for seizures like phenobarbital, primidone Certain medications for stomach problems General anesthetics like halothane, isoflurane, methoxyflurane, propofol Local anesthetics like lidocaine, pramoxine, tetracaine Medications that relax muscles for surgery Opioid medications for pain Phenothiazines like chlorpromazine, mesoridazine, prochlorperazine, thioridazine This list may not describe all possible interactions. Give your health care provider a list of all the medicines, herbs, non-prescription drugs, or dietary supplements you use. Also tell them if you smoke, drink alcohol, or use illegal drugs. Some items may interact with your medicine. What should I watch for while using this medication? Visit your care team for regular checks on your progress. You may want to keep a record at home of how you feel your condition is responding to treatment. You may want to share this information with your care team at each visit. You should contact your care team if your seizures get worse or if you have any new types of seizures. Do not stop taking this medication or  any of your seizure medications unless instructed by your care team. Stopping your medication suddenly can increase your seizures or their severity. This medication may cause serious skin reactions. They can happen weeks to months after starting the medication. Contact your care team right away if  you notice fevers or flu-like symptoms with a rash. The rash may be red or purple and then turn into blisters or peeling of the skin. Or, you might notice a red rash with swelling of the face, lips or lymph nodes in your neck or under your arms. Wear a medical identification bracelet or chain if you are taking this medication for seizures. Carry a card that lists all your medications. This medication may affect your coordination, reaction time, or judgment. Do not drive or operate machinery until you know how this medication affects you. Sit up or stand slowly to reduce the risk of dizzy or fainting spells. Drinking alcohol with this medication can increase the risk of these side effects. Your mouth may get dry. Chewing sugarless gum or sucking hard candy, and drinking plenty of water may help. Watch for new or worsening thoughts of suicide or depression. This includes sudden changes in mood, behaviors, or thoughts. These changes can happen at any time but are more common in the beginning of treatment or after a change in dose. Call your care team right away if you experience these thoughts or worsening depression. If you become pregnant while using this medication, you may enroll in the [place] American Antiepileptic Drug Pregnancy Registry by calling [phone]. This registry collects information about the safety of antiepileptic medication use during pregnancy. What side effects may I notice from receiving this medication? Side effects that you should report to your care team as soon as possible: Allergic reactions or angioedema--skin rash, itching, hives, swelling of the face, eyes, lips, tongue, arms, or legs, trouble swallowing or breathing Rash, fever, and swollen lymph nodes Thoughts of suicide or self harm, worsening mood, feelings of depression Trouble breathing Unusual changes in mood or behavior in children after use such as difficulty concentrating, hostility, or restlessness Side effects  that usually do not require medical attention (report to your care team if they continue or are bothersome): Dizziness Drowsiness Nausea Swelling of ankles, feet, or hands Vomiting This list may not describe all possible side effects. Call your doctor for medical advice about side effects. You may report side effects to FDA at 1-800-FDA-1088. Where should I keep my medication? Keep out of reach of children and pets. Store at room temperature between 15 and 30 degrees C (59 and 86 degrees F). Get rid of any unused medication after the expiration date. This medication may cause accidental overdose and death if taken by other adults, children, or pets. To get rid of medications that are no longer needed or have expired: Take the medication to a medication take-back program. Check with your pharmacy or law enforcement to find a location. If you cannot return the medication, check the label or package insert to see if the medication should be thrown out in the garbage or flushed down the toilet. If you are not sure, ask your care team. If it is safe to put it in the trash, empty the medication out of the container. Mix the medication with cat litter, dirt, coffee grounds, or other unwanted substance. Seal the mixture in a bag or container. Put it in the trash. NOTE: This sheet is a summary. It may not cover all possible information. If  you have questions about this medicine, talk to your doctor, pharmacist, or health care provider.  2024 Elsevier/Gold Standard (2022-04-28 00:00:00)

## 2023-03-23 NOTE — Progress Notes (Signed)
Subjective: Chief Complaint  Patient presents with   Foot Pain    RM13: patient is here for bilateral tightening in foot some tingling since last visit    89 year old female presents the office with above concerns.  She states it feels like her toes are pulling.  She is not sure if the gabapentin helped.  She has difficulty trim the nails may cause pain thickened elongated.  No open lesions.  She feels unsteady at times but no recent falls.  Last A1c was 10.6 on 11/05/2022  Objective: AAO x3, NAD DP/PT pulses palpable bilaterally, CRT less than 3 seconds Patient hypertrophic, dystrophic with incurvation of the nail borders.  There is no edema, erythema or signs of infection.  Tenderness nails 1-5 bilaterally. Hammertoes are present.  There is no other areas of discomfort noted today. No pain with calf compression, swelling, warmth, erythema  Assessment: 89 year old female symptomatic erythematous, neuropathy, gait instability  Plan: -All treatment options discussed with the patient including all alternatives, risks, complications.  -Separate debride the nails x 10 without any complications or bleeding -Will increase gabapentin to 20 mg at nighttime and discussed side effects.  She is tolerated 100 mg daily well without any side effects.  Discussed topical creams as well. -Ordered PT -Daily foot inspection and  glucose control. -Patient encouraged to call the office with any questions, concerns, change in symptoms.   Vivi Barrack DPM

## 2023-03-25 ENCOUNTER — Other Ambulatory Visit: Payer: Self-pay

## 2023-03-25 ENCOUNTER — Encounter: Payer: Self-pay | Admitting: Physical Therapy

## 2023-03-25 ENCOUNTER — Ambulatory Visit: Payer: Medicare Other | Attending: Podiatry | Admitting: Physical Therapy

## 2023-03-25 VITALS — BP 122/73 | HR 92

## 2023-03-25 DIAGNOSIS — M6281 Muscle weakness (generalized): Secondary | ICD-10-CM | POA: Insufficient documentation

## 2023-03-25 DIAGNOSIS — R2689 Other abnormalities of gait and mobility: Secondary | ICD-10-CM | POA: Insufficient documentation

## 2023-03-25 DIAGNOSIS — R2681 Unsteadiness on feet: Secondary | ICD-10-CM | POA: Insufficient documentation

## 2023-03-25 NOTE — Therapy (Signed)
OUTPATIENT PHYSICAL THERAPY NEURO EVALUATION   Patient Name: Alicia Medina MRN: [id number] DOB:07/19/1933, 89 y.o., female Today's Date: 03/26/2023   PCP: None - pt declined PCP finder paper REFERRING PROVIDER: Vivi Barrack, DPM  END OF SESSION:  PT End of Session - 03/25/23 1500     Visit Number 1    Number of Visits 5   4 + eval   Date for PT Re-Evaluation 05/07/23   pushed out due to scheduling delay   Authorization Type MEDICARE PART A AND B    Progress Note Due on Visit 10    PT Start Time 1452   pt arrived late   PT Stop Time 1530    PT Time Calculation (min) 38 min    Equipment Utilized During Treatment Gait belt    Behavior During Therapy WFL for tasks assessed/performed             Past Medical History:  Diagnosis Date   Arthritis    CAD (coronary artery disease) CARDIOLOGIST-  DR [place]   08-28-2011 Inferior STEMI with BMS to the RCA, complicated by cardiogenic shock, VF, VDRF , temporary CHB and abrupt reocclusion of the RCA stent treated with a second BMS to the RCA. Has residual LAD disease. EF is 50%   Diverticulosis of colon    Hemorrhoids, internal, with bleeding    Hyperlipidemia    Hypertension    Hypothyroidism, postsurgical    S/p bare metal coronary artery stent    2013   Type 2 diabetes mellitus (HCC)    Wears hearing aid    both ears   Past Surgical History:  Procedure Laterality Date   CARDIOVASCULAR STRESS TEST  10-03-2011  dr [place]   normal perfusion / no ischemia/ ef 77%   CARPAL TUNNEL RELEASE Right 01/25/2013   Procedure: CARPAL TUNNEL RELEASE;  Surgeon: Nicki Reaper, MD;  Location: [facility];  Service: Orthopedics;  Laterality: Right;   CARPAL TUNNEL RELEASE Left 10/25/2013   Procedure: LEFT CARPAL TUNNEL RELEASE;  Surgeon: Nicki Reaper, MD;  Location: [facility];  Service: Orthopedics;  Laterality: Left;   CATARACT EXTRACTION W/ INTRAOCULAR LENS  IMPLANT, BILATERAL  2007   COLONOSCOPY  04-27-2003    CORONARY ANGIOGRAM Left 08/28/2011   Procedure: CORONARY ANGIOGRAM;  Surgeon: Peter M [place], MD;  Location: [facility] CATH LAB;  Service: Cardiovascular;  Laterality: Left;   CORONARY ANGIOPLASTY WITH STENT PLACEMENT  08-28-2011  dr [place]   BMS to [facility] with abrupt reocclusion treated with second BMS/  LAD ostial 90%, proximal50-60%, first diagonal 80-90%   CORONARY STENT INTERVENTION N/A 10/11/2018   Procedure: CORONARY STENT INTERVENTION;  Surgeon: [place], Peter M, MD;  Location: [facility] INVASIVE CV LAB;  Service: Cardiovascular;  Laterality: N/A;   LEFT HEART CATH AND CORONARY ANGIOGRAPHY N/A 10/11/2018   Procedure: LEFT HEART CATH AND CORONARY ANGIOGRAPHY;  Surgeon: [place], Peter M, MD;  Location: [facility] INVASIVE CV LAB;  Service: Cardiovascular;  Laterality: N/A;   LEFT HEART CATHETERIZATION WITH CORONARY ANGIOGRAM N/A 08/28/2011   Procedure: LEFT HEART CATHETERIZATION WITH CORONARY ANGIOGRAM;  Surgeon: Peter M [place], MD;  Location: [facility] CATH LAB;  Service: Cardiovascular;  Laterality: N/A;   MASS EXCISION Right 08/06/2020   Procedure: EXCISION MASS AND DEBRIDEMENT PROXIMAL INTERPHALANGEAL JOINT RIGHT SMALL FINGER;  Surgeon: Cindee Salt, MD;  Location: [facility];  Service: Orthopedics;  Laterality: Right;  Bier block   PERCUTANEOUS CORONARY STENT INTERVENTION (PCI-S) Left 08/28/2011   Procedure:  PERCUTANEOUS CORONARY STENT INTERVENTION (PCI-S);  Surgeon: Peter M [place], MD;  Location: [facility] CATH LAB;  Service: Cardiovascular;  Laterality: Left;   TOTAL THYROIDECTOMY Bilateral 05-02-2007   multinodular goiter   TRANSANAL HEMORRHOIDAL DEARTERIALIZATION N/A 03/28/2014   Procedure: TRANSANAL HEMORRHOIDAL DEARTERIALIZATION;  Surgeon: Romie Levee, MD;  Location: [facility];  Service: General;  Laterality: N/A;   TRANSTHORACIC ECHOCARDIOGRAM  08-30-2011   mild LVH/ ef 50-55%/  grade I diastolic dysfunction   TRIGGER FINGER RELEASE Right 01/25/2013   Procedure: RELEASE TRIGGER FINGER/A-1  PULLEY RIGHT INDEX FINGER;  Surgeon: Nicki Reaper, MD;  Location: [facility];  Service: Orthopedics;  Laterality: Right;   TRIGGER FINGER RELEASE Left 10/25/2013   Procedure: RELEASE A-1 PULLEY LEFT MIDDLE FINGER;  Surgeon: Nicki Reaper, MD;  Location: [facility];  Service: Orthopedics;  Laterality: Left;   TRIGGER FINGER RELEASE Left 12/04/2014   Procedure: RELEASE TRIGGER FINGER/A-1 PULLEY LEFT RING FINGER;  Surgeon: Cindee Salt, MD;  Location: [facility];  Service: Orthopedics;  Laterality: Left;   TRIGGER FINGER RELEASE Right 03/30/2017   Procedure: RELEASE TRIGGER FINGER/A-1 PULLEY RIGHT RING FINGER AND RIGHT SMALL FINGER;  Surgeon: Cindee Salt, MD;  Location: [facility];  Service: Orthopedics;  Laterality: Right;   Patient Active Problem List   Diagnosis Date Noted   Anxiety 11/16/2022   Change in bowel habit 11/16/2022   Diarrhea 11/16/2022   Numbness 07/02/2021   Mucoid cyst, joint 08/14/2020   Osteoarthritis of finger of right hand 08/14/2020   Ankle pain, chronic 09/26/2019   Pain in right knee 08/30/2019   Pain due to onychomycosis of toenails of both feet 06/28/2019   History of noncompliance with medical treatment 11/10/2018   CAD (coronary artery disease) 11/01/2018   Angina pectoris (HCC) 10/11/2018   Abnormal nuclear stress test 10/11/2018   Diabetes mellitus due to underlying condition with stage 3 chronic kidney disease, without long-term current use of insulin (HCC) 11/29/2017   Hx of thyroidectomy 09/13/2017   Noncompliance with diet and medication regimen 09/13/2017   Stress and adjustment reaction 06/11/2017   Strain of latissimus dorsi muscle 06/11/2017   Diabetes mellitus due to underlying condition with microalbuminuria, with long-term current use of insulin (HCC) 03/11/2017   History of vitamin D deficiency 03/11/2017   Other fatigue 03/11/2017   Abscess 09/03/2016   Hypothyroidism, postsurgical 07/22/2016    Acquired trigger finger 06/15/2016   Hypertension associated with diabetes (HCC) 09/27/2014   Hemorrhoids, internal, with bleeding 05/08/2013   Hyperlipidemia associated with type 2 diabetes mellitus (HCC) 11/12/2011   Old inferior wall myocardial infarction 08/29/2011    ONSET DATE: pt unsure; referral 03/23/2023  REFERRING DIAG: R26.81 (ICD-10-CM) - Gait instability  THERAPY DIAG:  Unsteadiness on feet  Muscle weakness (generalized)  Other abnormalities of gait and mobility  Rationale for Evaluation and Treatment: Rehabilitation  SUBJECTIVE:  SUBJECTIVE STATEMENT: Pt states feet feel tight and she is sometimes wobbly, but is unsure when this started.  She states she does not want to end up in a nursing home before she needs to be. Pt accompanied by: self - drives herself  PERTINENT HISTORY: HTN, HLD, DM2, CKD stage 3, CAD, inferior STEMI, anxiety  PAIN:  Are you having pain? No  PRECAUTIONS: Fall  RED FLAGS: None   WEIGHT BEARING RESTRICTIONS: No  FALLS: Has patient fallen in last 6 months? No  LIVING ENVIRONMENT: Lives with: lives alone Lives in: House/apartment Stairs: Yes: External: 2-3 steps; on right going up Has following equipment at home: Single point cane, Walker - 2 wheeled, Shower bench, and bed side commode  PLOF: Independent  PATIENT GOALS: "Deter getting older and too wobbly."  OBJECTIVE:   DIAGNOSTIC FINDINGS: No recent relevant imaging.  COGNITION: Overall cognitive status: Within functional limits for tasks assessed - pt tangential   SENSATION: Light touch: WFL  COORDINATION: BLE RAMS:  WNL Bilateral Heel-to-shin:  WNL  EDEMA:  No notable edema, but pt notes a sensation of swelling in the feet  MUSCLE TONE: None noted in bilateral LE during  functional assessments.  POSTURE: forward head - mild  LOWER EXTREMITY ROM:     Active  Right Eval Left Eval  Hip flexion Grossly WFL  Hip extension   Hip abduction   Hip adduction   Hip internal rotation   Hip external rotation   Knee flexion   Knee extension   Ankle dorsiflexion   Ankle plantarflexion   Ankle inversion    Ankle eversion     (Blank rows = not tested)  LOWER EXTREMITY MMT:    MMT Right Eval Left Eval  Hip flexion 4-/5 4/5  Hip extension    Hip abduction    Hip adduction    Hip internal rotation    Hip external rotation    Knee flexion    Knee extension 5/5 5/5  Ankle dorsiflexion 2+/5 3/5  Ankle plantarflexion    Ankle inversion    Ankle eversion    (Blank rows = not tested)  BED MOBILITY:  IND per report  TRANSFERS: Assistive device utilized: None  Sit to stand: Modified independence Stand to sit: Modified independence Chair to chair: SBA  GAIT: Gait pattern: step through pattern, decreased arm swing- Right, decreased arm swing- Left, decreased stride length, decreased hip/knee flexion- Right, decreased hip/knee flexion- Left, decreased ankle dorsiflexion- Right, decreased ankle dorsiflexion- Left, and narrow BOS Distance walked: various clinic distances Assistive device utilized: None Level of assistance: SBA Comments: Pt wavers to the right into wall x1 when gazing down putting item into purse, but self-corrects.  FUNCTIONAL TESTS:  5 times sit to stand: 16.37 seconds w/ BUE on knees Timed up and go (TUG): 16.57 seconds no AD SBA 10 meter walk test: 14.19 sec no AD SBA = 0.70 m/sec OR 2.33 ft/sec  PATIENT SURVEYS:  ABC scale To be assessed.  TODAY'S TREATMENT:  DATE: N/A - eval only.   PATIENT EDUCATION: Education details: PT POC, assessments used and to be used, and goals to be set. Person educated:  Patient Education method: Explanation Education comprehension: verbalized understanding  HOME EXERCISE PROGRAM: To be established.  GOALS: Goals reviewed with patient? Yes  SHORT TERM GOALS = LONG TERM GOALS: Target date: 04/23/2023  Pt will be independent and compliant with balance and strength focused HEP in order to maintain functional progress and improve mobility. Baseline:  To be established. Goal status: INITIAL  2.  Pt will decrease 5xSTS to </=12.37 seconds in order to demonstrate decreased risk for falls and improved functional bilateral LE strength and power. Baseline: 16.37 sec w/ BUE support Goal status: INITIAL  3.  Pt will demonstrate TUG of </=12.57 seconds in order to decrease risk of falls and improve functional mobility using LRAD. Baseline: 16.57 sec no AD SBA Goal status: INITIAL  4.  Pt will demonstrate a gait speed of >/=2.66 feet/sec in order to decrease risk for falls. Baseline: 2.33 ft/sec no AD SBA Goal status: INITIAL  5.  ABC Scale to be assessed with LTG set as appropriate. Baseline:  To be assessed. Goal status: INITIAL  ASSESSMENT:  CLINICAL IMPRESSION: Patient is an 89 y.o. female who was seen today for physical therapy evaluation and treatment for imbalance.  Pt has a significant PMH of HTN, HLD, DM2, CKD stage 3, CAD, inferior STEMI, and anxiety.  Identified impairments include neuropathy, mild forward head posture, significant R DF weakness, wavering during ambulation and narrowed BOS.  Evaluation via the following assessment tools: 5xSTS, TUG, and indicate fall risk.  ABC Scale to be assessed next visit to assess fear of falling.  She would benefit from skilled PT to address impairments as noted and progress towards long term goals.  OBJECTIVE IMPAIRMENTS: Abnormal gait, decreased activity tolerance, decreased balance, decreased endurance, decreased ROM, decreased strength, and postural dysfunction.   ACTIVITY LIMITATIONS: carrying,  lifting, bending, squatting, stairs, and locomotion level  PARTICIPATION LIMITATIONS: shopping, community activity, and yard work  PERSONAL FACTORS: Age, Fitness, Past/current experiences, and 1-2 comorbidities: HTN, DM2  are also affecting patient's functional outcome.   REHAB POTENTIAL: Excellent  CLINICAL DECISION MAKING: Stable/uncomplicated  EVALUATION COMPLEXITY: Low  PLAN:  PT FREQUENCY: 1x/week  PT DURATION: 4 weeks (pt preference)  PLANNED INTERVENTIONS: Therapeutic exercises, Therapeutic activity, Neuromuscular re-education, Balance training, Gait training, Patient/Family education, Self Care, Joint mobilization, Stair training, Vestibular training, Orthotic/Fit training, DME instructions, Manual therapy, and Re-evaluation  PLAN FOR NEXT SESSION: ASSESS ABC Scale - set LTG.  Initiate LE strength and balance HEP.  Does she need a foot-up brace on R ankle?  Sadie Haber, PT, DPT 03/26/2023, 12:00 PM

## 2023-03-31 ENCOUNTER — Telehealth: Payer: Self-pay

## 2023-03-31 NOTE — Telephone Encounter (Signed)
Pharmacy Patient Advocate Encounter   Received notification from CoverMyMeds that prior authorization for Trulicity is required/requested.   Insurance verification completed.   The patient is insured through [employer] [facility] .   Per test claim: PA required; PA submitted to [employer] [facility] via CoverMyMeds Key/confirmation #/EOC [id number] Status is pending

## 2023-03-31 NOTE — Telephone Encounter (Signed)
LVM that her Trulicity has been approved

## 2023-03-31 NOTE — Telephone Encounter (Signed)
Pharmacy Patient Advocate Encounter  Received notification from [employer] [facility] that Prior Authorization for Trulicity has been APPROVED through 03/30/2024   PA #/Case ID/Reference #: [id number]

## 2023-04-02 ENCOUNTER — Other Ambulatory Visit: Payer: Self-pay | Admitting: Obstetrics and Gynecology

## 2023-04-02 DIAGNOSIS — Z0000 Encounter for general adult medical examination without abnormal findings: Secondary | ICD-10-CM

## 2023-04-05 ENCOUNTER — Ambulatory Visit: Payer: Medicare Other | Attending: Podiatry | Admitting: Physical Therapy

## 2023-04-05 ENCOUNTER — Encounter: Payer: Self-pay | Admitting: Physical Therapy

## 2023-04-05 DIAGNOSIS — R2689 Other abnormalities of gait and mobility: Secondary | ICD-10-CM | POA: Diagnosis present

## 2023-04-05 DIAGNOSIS — R2681 Unsteadiness on feet: Secondary | ICD-10-CM | POA: Diagnosis present

## 2023-04-05 DIAGNOSIS — M6281 Muscle weakness (generalized): Secondary | ICD-10-CM | POA: Insufficient documentation

## 2023-04-05 NOTE — Patient Instructions (Signed)
Access Code: [email or web address] URL: [email or web address] Date: 04/05/2023 Prepared by: Camille Bal  Exercises - Sit to Stand with Arms Crossed  - 1 x daily - 7 x weekly - 2 sets - 10 reps - Side Stepping with Resistance at Thighs and Counter Support  - 1 x daily - 5 x weekly - 3 sets - 10 reps - Forward Backward Monster Walk with Band at Thighs and Counter Support  - 1 x daily - 5 x weekly - 3 sets - 10 reps - Backward Tandem Walking with Counter Support  - 1 x daily - 5 x weekly - 3 sets - 10 reps - Tandem Walking with Counter Support  - 1 x daily - 5 x weekly - 3 sets - 10 reps

## 2023-04-05 NOTE — Therapy (Unsigned)
OUTPATIENT PHYSICAL THERAPY NEURO TREATMENT   Patient Name: Alicia Medina MRN: [id number] DOB:08/06/1932, 89 y.o., female Today's Date: 04/05/2023  PCP: None - pt declined PCP finder paper REFERRING PROVIDER: Vivi Barrack, DPM  END OF SESSION:  PT End of Session - 04/05/23 1455     Visit Number 2    Number of Visits 5   4 + eval   Date for PT Re-Evaluation 05/07/23   pushed out due to scheduling delay   Authorization Type MEDICARE PART A AND B    Progress Note Due on Visit 10    PT Start Time 1448    PT Stop Time 1535    PT Time Calculation (min) 47 min    Equipment Utilized During Treatment Gait belt    Activity Tolerance Patient tolerated treatment well    Behavior During Therapy WFL for tasks assessed/performed             Past Medical History:  Diagnosis Date   Arthritis    CAD (coronary artery disease) CARDIOLOGIST-  DR [place]   08-28-2011 Inferior STEMI with BMS to the RCA, complicated by cardiogenic shock, VF, VDRF , temporary CHB and abrupt reocclusion of the RCA stent treated with a second BMS to the RCA. Has residual LAD disease. EF is 50%   Diverticulosis of colon    Hemorrhoids, internal, with bleeding    Hyperlipidemia    Hypertension    Hypothyroidism, postsurgical    S/p bare metal coronary artery stent    2013   Type 2 diabetes mellitus (HCC)    Wears hearing aid    both ears   Past Surgical History:  Procedure Laterality Date   CARDIOVASCULAR STRESS TEST  10-03-2011  dr [place]   normal perfusion / no ischemia/ ef 77%   CARPAL TUNNEL RELEASE Right 01/25/2013   Procedure: CARPAL TUNNEL RELEASE;  Surgeon: Nicki Reaper, MD;  Location: [facility];  Service: Orthopedics;  Laterality: Right;   CARPAL TUNNEL RELEASE Left 10/25/2013   Procedure: LEFT CARPAL TUNNEL RELEASE;  Surgeon: Nicki Reaper, MD;  Location: [facility];  Service: Orthopedics;  Laterality: Left;   CATARACT EXTRACTION W/ INTRAOCULAR LENS  IMPLANT,  BILATERAL  2007   COLONOSCOPY  04-27-2003   CORONARY ANGIOGRAM Left 08/28/2011   Procedure: CORONARY ANGIOGRAM;  Surgeon: Peter M [place], MD;  Location: [facility] CATH LAB;  Service: Cardiovascular;  Laterality: Left;   CORONARY ANGIOPLASTY WITH STENT PLACEMENT  08-28-2011  dr [place]   BMS to [facility] with abrupt reocclusion treated with second BMS/  LAD ostial 90%, proximal50-60%, first diagonal 80-90%   CORONARY STENT INTERVENTION N/A 10/11/2018   Procedure: CORONARY STENT INTERVENTION;  Surgeon: [place], Peter M, MD;  Location: [facility] INVASIVE CV LAB;  Service: Cardiovascular;  Laterality: N/A;   LEFT HEART CATH AND CORONARY ANGIOGRAPHY N/A 10/11/2018   Procedure: LEFT HEART CATH AND CORONARY ANGIOGRAPHY;  Surgeon: [place], Peter M, MD;  Location: [facility] INVASIVE CV LAB;  Service: Cardiovascular;  Laterality: N/A;   LEFT HEART CATHETERIZATION WITH CORONARY ANGIOGRAM N/A 08/28/2011   Procedure: LEFT HEART CATHETERIZATION WITH CORONARY ANGIOGRAM;  Surgeon: Peter M [place], MD;  Location: [facility] CATH LAB;  Service: Cardiovascular;  Laterality: N/A;   MASS EXCISION Right 08/06/2020   Procedure: EXCISION MASS AND DEBRIDEMENT PROXIMAL INTERPHALANGEAL JOINT RIGHT SMALL FINGER;  Surgeon: Cindee Salt, MD;  Location: [facility];  Service: Orthopedics;  Laterality: Right;  Bier block   PERCUTANEOUS CORONARY STENT INTERVENTION (PCI-S) Left  08/28/2011   Procedure: PERCUTANEOUS CORONARY STENT INTERVENTION (PCI-S);  Surgeon: Peter M [place], MD;  Location: [facility] CATH LAB;  Service: Cardiovascular;  Laterality: Left;   TOTAL THYROIDECTOMY Bilateral 05-02-2007   multinodular goiter   TRANSANAL HEMORRHOIDAL DEARTERIALIZATION N/A 03/28/2014   Procedure: TRANSANAL HEMORRHOIDAL DEARTERIALIZATION;  Surgeon: Romie Levee, MD;  Location: [facility];  Service: General;  Laterality: N/A;   TRANSTHORACIC ECHOCARDIOGRAM  08-30-2011   mild LVH/ ef 50-55%/  grade I diastolic dysfunction   TRIGGER FINGER RELEASE Right 01/25/2013    Procedure: RELEASE TRIGGER FINGER/A-1 PULLEY RIGHT INDEX FINGER;  Surgeon: Nicki Reaper, MD;  Location: [facility];  Service: Orthopedics;  Laterality: Right;   TRIGGER FINGER RELEASE Left 10/25/2013   Procedure: RELEASE A-1 PULLEY LEFT MIDDLE FINGER;  Surgeon: Nicki Reaper, MD;  Location: [facility];  Service: Orthopedics;  Laterality: Left;   TRIGGER FINGER RELEASE Left 12/04/2014   Procedure: RELEASE TRIGGER FINGER/A-1 PULLEY LEFT RING FINGER;  Surgeon: Cindee Salt, MD;  Location: [facility];  Service: Orthopedics;  Laterality: Left;   TRIGGER FINGER RELEASE Right 03/30/2017   Procedure: RELEASE TRIGGER FINGER/A-1 PULLEY RIGHT RING FINGER AND RIGHT SMALL FINGER;  Surgeon: Cindee Salt, MD;  Location: [facility];  Service: Orthopedics;  Laterality: Right;   Patient Active Problem List   Diagnosis Date Noted   Anxiety 11/16/2022   Change in bowel habit 11/16/2022   Diarrhea 11/16/2022   Numbness 07/02/2021   Mucoid cyst, joint 08/14/2020   Osteoarthritis of finger of right hand 08/14/2020   Ankle pain, chronic 09/26/2019   Pain in right knee 08/30/2019   Pain due to onychomycosis of toenails of both feet 06/28/2019   History of noncompliance with medical treatment 11/10/2018   CAD (coronary artery disease) 11/01/2018   Angina pectoris (HCC) 10/11/2018   Abnormal nuclear stress test 10/11/2018   Diabetes mellitus due to underlying condition with stage 3 chronic kidney disease, without long-term current use of insulin (HCC) 11/29/2017   Hx of thyroidectomy 09/13/2017   Noncompliance with diet and medication regimen 09/13/2017   Stress and adjustment reaction 06/11/2017   Strain of latissimus dorsi muscle 06/11/2017   Diabetes mellitus due to underlying condition with microalbuminuria, with long-term current use of insulin (HCC) 03/11/2017   History of vitamin D deficiency 03/11/2017   Other fatigue 03/11/2017   Abscess 09/03/2016    Hypothyroidism, postsurgical 07/22/2016   Acquired trigger finger 06/15/2016   Hypertension associated with diabetes (HCC) 09/27/2014   Hemorrhoids, internal, with bleeding 05/08/2013   Hyperlipidemia associated with type 2 diabetes mellitus (HCC) 11/12/2011   Old inferior wall myocardial infarction 08/29/2011    ONSET DATE: pt unsure; referral 03/23/2023  REFERRING DIAG: R26.81 (ICD-10-CM) - Gait instability  THERAPY DIAG:  Unsteadiness on feet  Muscle weakness (generalized)  Other abnormalities of gait and mobility  Rationale for Evaluation and Treatment: Rehabilitation  SUBJECTIVE:  SUBJECTIVE STATEMENT: Pt states feet feel tight today.  She requests printed medication list.  Her neighbors dog knocked her back into her sofa, but she denies injuries following stumble.   Pt accompanied by: self - drives herself  PERTINENT HISTORY: HTN, HLD, DM2, CKD stage 3, CAD, inferior STEMI, anxiety  PAIN:  Are you having pain? No - feet feel tight/swollen  PRECAUTIONS: Fall  RED FLAGS: None   WEIGHT BEARING RESTRICTIONS: No  FALLS: Has patient fallen in last 6 months? No  LIVING ENVIRONMENT: Lives with: lives alone Lives in: House/apartment Stairs: Yes: External: 2-3 steps; on right going up Has following equipment at home: Single point cane, Walker - 2 wheeled, Shower bench, and bed side commode  PLOF: Independent  PATIENT GOALS: "Deter getting older and too wobbly."  OBJECTIVE:   DIAGNOSTIC FINDINGS: No recent relevant imaging.  COGNITION: Overall cognitive status: Within functional limits for tasks assessed - pt tangential   SENSATION: Light touch: WFL  COORDINATION: BLE RAMS:  WNL Bilateral Heel-to-shin:  WNL  EDEMA:  No notable edema, but pt notes a sensation of swelling in  the feet  MUSCLE TONE: None noted in bilateral LE during functional assessments.  POSTURE: forward head - mild  LOWER EXTREMITY ROM:     Active  Right Eval Left Eval  Hip flexion Grossly WFL  Hip extension   Hip abduction   Hip adduction   Hip internal rotation   Hip external rotation   Knee flexion   Knee extension   Ankle dorsiflexion   Ankle plantarflexion   Ankle inversion    Ankle eversion     (Blank rows = not tested)  LOWER EXTREMITY MMT:    MMT Right Eval Left Eval  Hip flexion 4-/5 4/5  Hip extension    Hip abduction    Hip adduction    Hip internal rotation    Hip external rotation    Knee flexion    Knee extension 5/5 5/5  Ankle dorsiflexion 2+/5 3/5  Ankle plantarflexion    Ankle inversion    Ankle eversion    (Blank rows = not tested)  BED MOBILITY:  IND per report  TRANSFERS: Assistive device utilized: None  Sit to stand: Modified independence Stand to sit: Modified independence Chair to chair: SBA  GAIT: Gait pattern: step through pattern, decreased arm swing- Right, decreased arm swing- Left, decreased stride length, decreased hip/knee flexion- Right, decreased hip/knee flexion- Left, decreased ankle dorsiflexion- Right, decreased ankle dorsiflexion- Left, and narrow BOS Distance walked: various clinic distances Assistive device utilized: None Level of assistance: SBA Comments: Pt wavers to the right into wall x1 when gazing down putting item into purse, but self-corrects.  FUNCTIONAL TESTS:  5 times sit to stand: 16.37 seconds w/ BUE on knees Timed up and go (TUG): 16.57 seconds no AD SBA 10 meter walk test: 14.19 sec no AD SBA = 0.70 m/sec OR 2.33 ft/sec  PATIENT SURVEYS:  ABC scale To be assessed.  TODAY'S TREATMENT:  DATE: 04/05/2023 -Time spent for pt to update written medication list as printed per  request. -ABC Scale:  92.19%; increased time to complete  Initiated HEP: -STS 2x10 no UE support -Lateral stepping w/ red theraband at thighs 6x10 ft no UE support SBA -Forward and backward monster walks w/ red theraband at thighs 6x10 ft each direction, cued for increased squatted walk for increased quad and glute strenghtening intermittent UE support -Forward and backward tandem walking using intermittent UE support 6x10 ft each direction, cued for improved left motor control to improve tandem stance  PATIENT EDUCATION: Education details: Attempted education on blood sugar contribution to sensation in feet via possible neuropathy - pt states she is unconvinced that she is diabetic.  Ongoing education on primary care establishment, pt declines offer of resources stating she will continue using urgent care.  Discussed pt returning to walking and getting into [facility] as she is a member and feels confident entering/exiting pool.  Encouraged pt to schedule remaining 2 visits at end of session. Person educated: Patient Education method: Explanation Education comprehension: verbalized understanding  HOME EXERCISE PROGRAM: Access Code: [email or web address] URL: [email or web address] Date: 04/05/2023 Prepared by: Camille Bal  Exercises - Sit to Stand with Arms Crossed  - 1 x daily - 7 x weekly - 2 sets - 10 reps - Side Stepping with Resistance at Thighs and Counter Support  - 1 x daily - 5 x weekly - 3 sets - 10 reps - Forward Backward Monster Walk with Band at Thighs and Counter Support  - 1 x daily - 5 x weekly - 3 sets - 10 reps - Backward Tandem Walking with Counter Support  - 1 x daily - 5 x weekly - 3 sets - 10 reps - Tandem Walking with Counter Support  - 1 x daily - 5 x weekly - 3 sets - 10 reps  GOALS: Goals reviewed with patient? Yes  SHORT TERM GOALS = LONG TERM GOALS: Target date: 04/23/2023  Pt will be independent and compliant with balance and strength focused HEP in  order to maintain functional progress and improve mobility. Baseline:  To be established. Goal status: INITIAL  2.  Pt will decrease 5xSTS to </=12.37 seconds in order to demonstrate decreased risk for falls and improved functional bilateral LE strength and power. Baseline: 16.37 sec w/ BUE support Goal status: INITIAL  3.  Pt will demonstrate TUG of </=12.57 seconds in order to decrease risk of falls and improve functional mobility using LRAD. Baseline: 16.57 sec no AD SBA Goal status: INITIAL  4.  Pt will demonstrate a gait speed of >/=2.66 feet/sec in order to decrease risk for falls. Baseline: 2.33 ft/sec no AD SBA Goal status: INITIAL  5.  GOAL NOT APPROPRIATE AT THIS TIME. Baseline:  92.19% (9/9) Goal status: DISCHARGED  ASSESSMENT:  CLINICAL IMPRESSION: Patient is an 89 y.o. female who was seen today for physical therapy evaluation and treatment for imbalance.  Pt has a significant PMH of HTN, HLD, DM2, CKD stage 3, CAD, inferior STEMI, and anxiety.  Identified impairments include neuropathy, mild forward head posture, significant R DF weakness, wavering during ambulation and narrowed BOS.  Evaluation via the following assessment tools: 5xSTS, TUG, and indicate fall risk.  ABC Scale to be assessed next visit to assess fear of falling.  She would benefit from skilled PT to address impairments as noted and progress towards long term goals.  OBJECTIVE IMPAIRMENTS: Abnormal gait, decreased activity tolerance, decreased balance, decreased endurance, decreased ROM, decreased  strength, and postural dysfunction.   ACTIVITY LIMITATIONS: carrying, lifting, bending, squatting, stairs, and locomotion level  PARTICIPATION LIMITATIONS: shopping, community activity, and yard work  PERSONAL FACTORS: Age, Fitness, Past/current experiences, and 1-2 comorbidities: HTN, DM2  are also affecting patient's functional outcome.   REHAB POTENTIAL: Excellent  CLINICAL DECISION MAKING:  Stable/uncomplicated  EVALUATION COMPLEXITY: Low  PLAN:  PT FREQUENCY: 1x/week  PT DURATION: 4 weeks (pt preference)  PLANNED INTERVENTIONS: Therapeutic exercises, Therapeutic activity, Neuromuscular re-education, Balance training, Gait training, Patient/Family education, Self Care, Joint mobilization, Stair training, Vestibular training, Orthotic/Fit training, DME instructions, Manual therapy, and Re-evaluation  PLAN FOR NEXT SESSION:  Initiate LE strength and balance HEP.  Does she need a foot-up brace on R ankle?  Sadie Haber, PT, DPT 04/05/2023, 3:37 PM

## 2023-04-16 ENCOUNTER — Encounter: Payer: Self-pay | Admitting: Physical Therapy

## 2023-04-16 ENCOUNTER — Ambulatory Visit: Payer: Medicare Other | Admitting: Physical Therapy

## 2023-04-16 VITALS — BP 135/70 | HR 71

## 2023-04-16 DIAGNOSIS — R2681 Unsteadiness on feet: Secondary | ICD-10-CM

## 2023-04-16 DIAGNOSIS — M6281 Muscle weakness (generalized): Secondary | ICD-10-CM

## 2023-04-16 DIAGNOSIS — R2689 Other abnormalities of gait and mobility: Secondary | ICD-10-CM

## 2023-04-16 NOTE — Therapy (Signed)
OUTPATIENT PHYSICAL THERAPY NEURO TREATMENT   Patient Name: Alicia Medina MRN: [id number] DOB:Dec 19, 1932, 89 y.o., female Today's Date: 04/16/2023  PCP: None - pt declined PCP finder paper REFERRING PROVIDER: Vivi Barrack, DPM  END OF SESSION:  PT End of Session - 04/16/23 1411     Visit Number 3    Number of Visits 5   4 + eval   Date for PT Re-Evaluation 05/07/23   pushed out due to scheduling delay   Authorization Type MEDICARE PART A AND B    Progress Note Due on Visit 10    PT Start Time 1406    PT Stop Time 1445    PT Time Calculation (min) 39 min    Equipment Utilized During Treatment Gait belt    Activity Tolerance Patient tolerated treatment well    Behavior During Therapy WFL for tasks assessed/performed             Past Medical History:  Diagnosis Date   Arthritis    CAD (coronary artery disease) CARDIOLOGIST-  DR [place]   08-28-2011 Inferior STEMI with BMS to the RCA, complicated by cardiogenic shock, VF, VDRF , temporary CHB and abrupt reocclusion of the RCA stent treated with a second BMS to the RCA. Has residual LAD disease. EF is 50%   Diverticulosis of colon    Hemorrhoids, internal, with bleeding    Hyperlipidemia    Hypertension    Hypothyroidism, postsurgical    S/p bare metal coronary artery stent    2013   Type 2 diabetes mellitus (HCC)    Wears hearing aid    both ears   Past Surgical History:  Procedure Laterality Date   CARDIOVASCULAR STRESS TEST  10-03-2011  dr [place]   normal perfusion / no ischemia/ ef 77%   CARPAL TUNNEL RELEASE Right 01/25/2013   Procedure: CARPAL TUNNEL RELEASE;  Surgeon: Nicki Reaper, MD;  Location: [facility];  Service: Orthopedics;  Laterality: Right;   CARPAL TUNNEL RELEASE Left 10/25/2013   Procedure: LEFT CARPAL TUNNEL RELEASE;  Surgeon: Nicki Reaper, MD;  Location: [facility];  Service: Orthopedics;  Laterality: Left;   CATARACT EXTRACTION W/ INTRAOCULAR LENS  IMPLANT,  BILATERAL  2007   COLONOSCOPY  04-27-2003   CORONARY ANGIOGRAM Left 08/28/2011   Procedure: CORONARY ANGIOGRAM;  Surgeon: Peter M [place], MD;  Location: [facility] CATH LAB;  Service: Cardiovascular;  Laterality: Left;   CORONARY ANGIOPLASTY WITH STENT PLACEMENT  08-28-2011  dr [place]   BMS to [facility] with abrupt reocclusion treated with second BMS/  LAD ostial 90%, proximal50-60%, first diagonal 80-90%   CORONARY STENT INTERVENTION N/A 10/11/2018   Procedure: CORONARY STENT INTERVENTION;  Surgeon: [place], Peter M, MD;  Location: [facility] INVASIVE CV LAB;  Service: Cardiovascular;  Laterality: N/A;   LEFT HEART CATH AND CORONARY ANGIOGRAPHY N/A 10/11/2018   Procedure: LEFT HEART CATH AND CORONARY ANGIOGRAPHY;  Surgeon: [place], Peter M, MD;  Location: [facility] INVASIVE CV LAB;  Service: Cardiovascular;  Laterality: N/A;   LEFT HEART CATHETERIZATION WITH CORONARY ANGIOGRAM N/A 08/28/2011   Procedure: LEFT HEART CATHETERIZATION WITH CORONARY ANGIOGRAM;  Surgeon: Peter M [place], MD;  Location: [facility] CATH LAB;  Service: Cardiovascular;  Laterality: N/A;   MASS EXCISION Right 08/06/2020   Procedure: EXCISION MASS AND DEBRIDEMENT PROXIMAL INTERPHALANGEAL JOINT RIGHT SMALL FINGER;  Surgeon: Cindee Salt, MD;  Location: [facility];  Service: Orthopedics;  Laterality: Right;  Bier block   PERCUTANEOUS CORONARY STENT INTERVENTION (PCI-S) Left  08/28/2011   Procedure: PERCUTANEOUS CORONARY STENT INTERVENTION (PCI-S);  Surgeon: Peter M [place], MD;  Location: [facility] CATH LAB;  Service: Cardiovascular;  Laterality: Left;   TOTAL THYROIDECTOMY Bilateral 05-02-2007   multinodular goiter   TRANSANAL HEMORRHOIDAL DEARTERIALIZATION N/A 03/28/2014   Procedure: TRANSANAL HEMORRHOIDAL DEARTERIALIZATION;  Surgeon: Romie Levee, MD;  Location: [facility];  Service: General;  Laterality: N/A;   TRANSTHORACIC ECHOCARDIOGRAM  08-30-2011   mild LVH/ ef 50-55%/  grade I diastolic dysfunction   TRIGGER FINGER RELEASE Right 01/25/2013    Procedure: RELEASE TRIGGER FINGER/A-1 PULLEY RIGHT INDEX FINGER;  Surgeon: Nicki Reaper, MD;  Location: [facility];  Service: Orthopedics;  Laterality: Right;   TRIGGER FINGER RELEASE Left 10/25/2013   Procedure: RELEASE A-1 PULLEY LEFT MIDDLE FINGER;  Surgeon: Nicki Reaper, MD;  Location: [facility];  Service: Orthopedics;  Laterality: Left;   TRIGGER FINGER RELEASE Left 12/04/2014   Procedure: RELEASE TRIGGER FINGER/A-1 PULLEY LEFT RING FINGER;  Surgeon: Cindee Salt, MD;  Location: [facility];  Service: Orthopedics;  Laterality: Left;   TRIGGER FINGER RELEASE Right 03/30/2017   Procedure: RELEASE TRIGGER FINGER/A-1 PULLEY RIGHT RING FINGER AND RIGHT SMALL FINGER;  Surgeon: Cindee Salt, MD;  Location: [facility];  Service: Orthopedics;  Laterality: Right;   Patient Active Problem List   Diagnosis Date Noted   Anxiety 11/16/2022   Change in bowel habit 11/16/2022   Diarrhea 11/16/2022   Numbness 07/02/2021   Mucoid cyst, joint 08/14/2020   Osteoarthritis of finger of right hand 08/14/2020   Ankle pain, chronic 09/26/2019   Pain in right knee 08/30/2019   Pain due to onychomycosis of toenails of both feet 06/28/2019   History of noncompliance with medical treatment 11/10/2018   CAD (coronary artery disease) 11/01/2018   Angina pectoris (HCC) 10/11/2018   Abnormal nuclear stress test 10/11/2018   Diabetes mellitus due to underlying condition with stage 3 chronic kidney disease, without long-term current use of insulin (HCC) 11/29/2017   Hx of thyroidectomy 09/13/2017   Noncompliance with diet and medication regimen 09/13/2017   Stress and adjustment reaction 06/11/2017   Strain of latissimus dorsi muscle 06/11/2017   Diabetes mellitus due to underlying condition with microalbuminuria, with long-term current use of insulin (HCC) 03/11/2017   History of vitamin D deficiency 03/11/2017   Other fatigue 03/11/2017   Abscess 09/03/2016    Hypothyroidism, postsurgical 07/22/2016   Acquired trigger finger 06/15/2016   Hypertension associated with diabetes (HCC) 09/27/2014   Hemorrhoids, internal, with bleeding 05/08/2013   Hyperlipidemia associated with type 2 diabetes mellitus (HCC) 11/12/2011   Old inferior wall myocardial infarction 08/29/2011    ONSET DATE: pt unsure; referral 03/23/2023  REFERRING DIAG: R26.81 (ICD-10-CM) - Gait instability  THERAPY DIAG:  Unsteadiness on feet  Muscle weakness (generalized)  Other abnormalities of gait and mobility  Rationale for Evaluation and Treatment: Rehabilitation  SUBJECTIVE:  SUBJECTIVE STATEMENT: Pt denies recent falls, but continues to feel wobbly.  She is tired today.  She is not doing her HEP and does not recall these. Pt accompanied by: self - drives herself  PERTINENT HISTORY: HTN, HLD, DM2, CKD stage 3, CAD, inferior STEMI, anxiety  PAIN:  Are you having pain? No  PRECAUTIONS: Fall  RED FLAGS: None   WEIGHT BEARING RESTRICTIONS: No  FALLS: Has patient fallen in last 6 months? No  LIVING ENVIRONMENT: Lives with: lives alone Lives in: House/apartment Stairs: Yes: External: 2-3 steps; on right going up Has following equipment at home: Single point cane, Walker - 2 wheeled, Shower bench, and bed side commode  PLOF: Independent  PATIENT GOALS: "Deter getting older and too wobbly."  OBJECTIVE:   DIAGNOSTIC FINDINGS: No recent relevant imaging.  COGNITION: Overall cognitive status: Within functional limits for tasks assessed - pt tangential   SENSATION: Light touch: WFL  COORDINATION: BLE RAMS:  WNL Bilateral Heel-to-shin:  WNL  EDEMA:  No notable edema, but pt notes a sensation of swelling in the feet  MUSCLE TONE: None noted in bilateral LE during  functional assessments.  POSTURE: forward head - mild  LOWER EXTREMITY ROM:     Active  Right Eval Left Eval  Hip flexion Grossly WFL  Hip extension   Hip abduction   Hip adduction   Hip internal rotation   Hip external rotation   Knee flexion   Knee extension   Ankle dorsiflexion   Ankle plantarflexion   Ankle inversion    Ankle eversion     (Blank rows = not tested)  LOWER EXTREMITY MMT:    MMT Right Eval Left Eval  Hip flexion 4-/5 4/5  Hip extension    Hip abduction    Hip adduction    Hip internal rotation    Hip external rotation    Knee flexion    Knee extension 5/5 5/5  Ankle dorsiflexion 2+/5 3/5  Ankle plantarflexion    Ankle inversion    Ankle eversion    (Blank rows = not tested)  BED MOBILITY:  IND per report  TRANSFERS: Assistive device utilized: None  Sit to stand: Modified independence Stand to sit: Modified independence Chair to chair: SBA  GAIT: Gait pattern: step through pattern, decreased arm swing- Right, decreased arm swing- Left, decreased stride length, decreased hip/knee flexion- Right, decreased hip/knee flexion- Left, decreased ankle dorsiflexion- Right, decreased ankle dorsiflexion- Left, and narrow BOS Distance walked: various clinic distances Assistive device utilized: None Level of assistance: SBA Comments: Pt wavers to the right into wall x1 when gazing down putting item into purse, but self-corrects.  FUNCTIONAL TESTS:  5 times sit to stand: 16.37 seconds w/ BUE on knees Timed up and go (TUG): 16.57 seconds no AD SBA 10 meter walk test: 14.19 sec no AD SBA = 0.70 m/sec OR 2.33 ft/sec  PATIENT SURVEYS:  ABC scale To be assessed.  TODAY'S TREATMENT:  DATE: 04/16/2023 -Reprinted HEP for patient and answered questions as appropriate. -4-8" hurdles forward facing w/ CGA and cues to maintain close  approximation to hurdles -8" massed task practice w/ lateral step-overs x20 -Pt ambulates outside over unlevel sidewalk x500 ft SBA-CGA, she maintains right drift w/ min cues to correct to midline path.  She has several instances of staggering.  Discussed full-time use of cane and change in movement noted today.  Patient is also repetitive reiterating daily schedule to therapist x4.  She appears more lethargic during ambulatory task so assessed BP following: Vitals:   04/16/23 1432  BP: 135/70  Pulse: 71   -Step taps on airex BUE support x20 > unilateral UE support x20 > no UE support x20 -Standing on A/P oriented tilt board holding level > A/P weight shifts > mini squats 2x10 w/ light BUE support  PATIENT EDUCATION: Education details: HEP compliance. Person educated: Patient Education method: Explanation Education comprehension: verbalized understanding  HOME EXERCISE PROGRAM: Access Code: [email or web address] URL: [email or web address] Date: 04/05/2023 Prepared by: Camille Bal  Exercises - Sit to Stand with Arms Crossed  - 1 x daily - 7 x weekly - 2 sets - 10 reps - Side Stepping with Resistance at Thighs and Counter Support  - 1 x daily - 5 x weekly - 3 sets - 10 reps - Forward Backward Monster Walk with Band at Thighs and Counter Support  - 1 x daily - 5 x weekly - 3 sets - 10 reps - Backward Tandem Walking with Counter Support  - 1 x daily - 5 x weekly - 3 sets - 10 reps - Tandem Walking with Counter Support  - 1 x daily - 5 x weekly - 3 sets - 10 reps  GOALS: Goals reviewed with patient? Yes  SHORT TERM GOALS = LONG TERM GOALS: Target date: 04/23/2023  Pt will be independent and compliant with balance and strength focused HEP in order to maintain functional progress and improve mobility. Baseline:  To be established. Goal status: INITIAL  2.  Pt will decrease 5xSTS to </=12.37 seconds in order to demonstrate decreased risk for falls and improved functional  bilateral LE strength and power. Baseline: 16.37 sec w/ BUE support Goal status: INITIAL  3.  Pt will demonstrate TUG of </=12.57 seconds in order to decrease risk of falls and improve functional mobility using LRAD. Baseline: 16.57 sec no AD SBA Goal status: INITIAL  4.  Pt will demonstrate a gait speed of >/=2.66 feet/sec in order to decrease risk for falls. Baseline: 2.33 ft/sec no AD SBA Goal status: INITIAL  5.  GOAL NOT APPROPRIATE AT THIS TIME. Baseline:  92.19% (9/9) Goal status: DISCHARGED  ASSESSMENT:  CLINICAL IMPRESSION: Reprinted HEP for patient today to improve compliance.  She initiated session with more staggering of gait and some appearance of mild cognitive change regarding recall and spatial awareness from session prior.  This seemed to improve as session went on.  Further address SLS and static balance today with patient tolerating challenges well.  Will continue POC.  OBJECTIVE IMPAIRMENTS: Abnormal gait, decreased activity tolerance, decreased balance, decreased endurance, decreased ROM, decreased strength, and postural dysfunction.   ACTIVITY LIMITATIONS: carrying, lifting, bending, squatting, stairs, and locomotion level  PARTICIPATION LIMITATIONS: shopping, community activity, and yard work  PERSONAL FACTORS: Age, Fitness, Past/current experiences, and 1-2 comorbidities: HTN, DM2  are also affecting patient's functional outcome.   REHAB POTENTIAL: Excellent  CLINICAL DECISION MAKING: Stable/uncomplicated  EVALUATION COMPLEXITY: Low  PLAN:  PT FREQUENCY: 1x/week  PT DURATION: 4 weeks (pt preference)  PLANNED INTERVENTIONS: Therapeutic exercises, Therapeutic activity, Neuromuscular re-education, Balance training, Gait training, Patient/Family education, Self Care, Joint mobilization, Stair training, Vestibular training, Orthotic/Fit training, DME instructions, Manual therapy, and Re-evaluation  PLAN FOR NEXT SESSION:  Modify LE strength and balance  HEP prn.  Unlevel/compliant surfaces.  Blaze pods.  SLS/tandem.  Sadie Haber, PT, DPT 04/16/2023, 2:45 PM

## 2023-04-23 ENCOUNTER — Encounter: Payer: Self-pay | Admitting: Physical Therapy

## 2023-04-23 ENCOUNTER — Ambulatory Visit: Payer: Medicare Other | Admitting: Physical Therapy

## 2023-04-23 DIAGNOSIS — M6281 Muscle weakness (generalized): Secondary | ICD-10-CM

## 2023-04-23 DIAGNOSIS — R2681 Unsteadiness on feet: Secondary | ICD-10-CM

## 2023-04-23 DIAGNOSIS — R2689 Other abnormalities of gait and mobility: Secondary | ICD-10-CM

## 2023-04-23 NOTE — Therapy (Unsigned)
OUTPATIENT PHYSICAL THERAPY NEURO TREATMENT   Patient Name: Alicia Medina MRN: [id number] DOB:1933-07-01, 89 y.o., female Today's Date: 04/23/2023  PCP: None - pt declined PCP finder paper REFERRING PROVIDER: Vivi Barrack, DPM  END OF SESSION:   04/23/23 1408  PT Visits / Re-Eval  Visit Number 4  Number of Visits 5 (4 + eval)  Date for PT Re-Evaluation 05/07/23 (pushed out due to scheduling delay)  Authorization  Authorization Type MEDICARE PART A AND B  Progress Note Due on Visit 10  PT Time Calculation  PT Start Time 1408 (Patient in restroom at onset of session)  PT Stop Time 1455  PT Time Calculation (min) 47 min  PT - End of Session  Equipment Utilized During Treatment Gait belt  Activity Tolerance Patient tolerated treatment well  Behavior During Therapy WFL for tasks assessed/performed   Past Medical History:  Diagnosis Date   Arthritis    CAD (coronary artery disease) CARDIOLOGIST-  DR [place]   08-28-2011 Inferior STEMI with BMS to the RCA, complicated by cardiogenic shock, VF, VDRF , temporary CHB and abrupt reocclusion of the RCA stent treated with a second BMS to the RCA. Has residual LAD disease. EF is 50%   Diverticulosis of colon    Hemorrhoids, internal, with bleeding    Hyperlipidemia    Hypertension    Hypothyroidism, postsurgical    S/p bare metal coronary artery stent    2013   Type 2 diabetes mellitus (HCC)    Wears hearing aid    both ears   Past Surgical History:  Procedure Laterality Date   CARDIOVASCULAR STRESS TEST  10-03-2011  dr [place]   normal perfusion / no ischemia/ ef 77%   CARPAL TUNNEL RELEASE Right 01/25/2013   Procedure: CARPAL TUNNEL RELEASE;  Surgeon: Nicki Reaper, MD;  Location: [facility];  Service: Orthopedics;  Laterality: Right;   CARPAL TUNNEL RELEASE Left 10/25/2013   Procedure: LEFT CARPAL TUNNEL RELEASE;  Surgeon: Nicki Reaper, MD;  Location: [facility];  Service: Orthopedics;   Laterality: Left;   CATARACT EXTRACTION W/ INTRAOCULAR LENS  IMPLANT, BILATERAL  2007   COLONOSCOPY  04-27-2003   CORONARY ANGIOGRAM Left 08/28/2011   Procedure: CORONARY ANGIOGRAM;  Surgeon: Peter M [place], MD;  Location: [facility] CATH LAB;  Service: Cardiovascular;  Laterality: Left;   CORONARY ANGIOPLASTY WITH STENT PLACEMENT  08-28-2011  dr [place]   BMS to [facility] with abrupt reocclusion treated with second BMS/  LAD ostial 90%, proximal50-60%, first diagonal 80-90%   CORONARY STENT INTERVENTION N/A 10/11/2018   Procedure: CORONARY STENT INTERVENTION;  Surgeon: [place], Peter M, MD;  Location: [facility] INVASIVE CV LAB;  Service: Cardiovascular;  Laterality: N/A;   LEFT HEART CATH AND CORONARY ANGIOGRAPHY N/A 10/11/2018   Procedure: LEFT HEART CATH AND CORONARY ANGIOGRAPHY;  Surgeon: [place], Peter M, MD;  Location: [facility] INVASIVE CV LAB;  Service: Cardiovascular;  Laterality: N/A;   LEFT HEART CATHETERIZATION WITH CORONARY ANGIOGRAM N/A 08/28/2011   Procedure: LEFT HEART CATHETERIZATION WITH CORONARY ANGIOGRAM;  Surgeon: Peter M [place], MD;  Location: [facility] CATH LAB;  Service: Cardiovascular;  Laterality: N/A;   MASS EXCISION Right 08/06/2020   Procedure: EXCISION MASS AND DEBRIDEMENT PROXIMAL INTERPHALANGEAL JOINT RIGHT SMALL FINGER;  Surgeon: Cindee Salt, MD;  Location: [facility];  Service: Orthopedics;  Laterality: Right;  Bier block   PERCUTANEOUS CORONARY STENT INTERVENTION (PCI-S) Left 08/28/2011   Procedure: PERCUTANEOUS CORONARY STENT INTERVENTION (PCI-S);  Surgeon: Peter M [place], MD;  Location: [facility] CATH LAB;  Service: Cardiovascular;  Laterality: Left;   TOTAL THYROIDECTOMY Bilateral 05-02-2007   multinodular goiter   TRANSANAL HEMORRHOIDAL DEARTERIALIZATION N/A 03/28/2014   Procedure: TRANSANAL HEMORRHOIDAL DEARTERIALIZATION;  Surgeon: Romie Levee, MD;  Location: [facility];  Service: General;  Laterality: N/A;   TRANSTHORACIC ECHOCARDIOGRAM  08-30-2011   mild LVH/ ef 50-55%/   grade I diastolic dysfunction   TRIGGER FINGER RELEASE Right 01/25/2013   Procedure: RELEASE TRIGGER FINGER/A-1 PULLEY RIGHT INDEX FINGER;  Surgeon: Nicki Reaper, MD;  Location: [facility];  Service: Orthopedics;  Laterality: Right;   TRIGGER FINGER RELEASE Left 10/25/2013   Procedure: RELEASE A-1 PULLEY LEFT MIDDLE FINGER;  Surgeon: Nicki Reaper, MD;  Location: [facility];  Service: Orthopedics;  Laterality: Left;   TRIGGER FINGER RELEASE Left 12/04/2014   Procedure: RELEASE TRIGGER FINGER/A-1 PULLEY LEFT RING FINGER;  Surgeon: Cindee Salt, MD;  Location: [facility];  Service: Orthopedics;  Laterality: Left;   TRIGGER FINGER RELEASE Right 03/30/2017   Procedure: RELEASE TRIGGER FINGER/A-1 PULLEY RIGHT RING FINGER AND RIGHT SMALL FINGER;  Surgeon: Cindee Salt, MD;  Location: [facility];  Service: Orthopedics;  Laterality: Right;   Patient Active Problem List   Diagnosis Date Noted   Anxiety 11/16/2022   Change in bowel habit 11/16/2022   Diarrhea 11/16/2022   Numbness 07/02/2021   Mucoid cyst, joint 08/14/2020   Osteoarthritis of finger of right hand 08/14/2020   Ankle pain, chronic 09/26/2019   Pain in right knee 08/30/2019   Pain due to onychomycosis of toenails of both feet 06/28/2019   History of noncompliance with medical treatment 11/10/2018   CAD (coronary artery disease) 11/01/2018   Angina pectoris (HCC) 10/11/2018   Abnormal nuclear stress test 10/11/2018   Diabetes mellitus due to underlying condition with stage 3 chronic kidney disease, without long-term current use of insulin (HCC) 11/29/2017   Hx of thyroidectomy 09/13/2017   Noncompliance with diet and medication regimen 09/13/2017   Stress and adjustment reaction 06/11/2017   Strain of latissimus dorsi muscle 06/11/2017   Diabetes mellitus due to underlying condition with microalbuminuria, with long-term current use of insulin (HCC) 03/11/2017   History of vitamin D  deficiency 03/11/2017   Other fatigue 03/11/2017   Abscess 09/03/2016   Hypothyroidism, postsurgical 07/22/2016   Acquired trigger finger 06/15/2016   Hypertension associated with diabetes (HCC) 09/27/2014   Hemorrhoids, internal, with bleeding 05/08/2013   Hyperlipidemia associated with type 2 diabetes mellitus (HCC) 11/12/2011   Old inferior wall myocardial infarction 08/29/2011    ONSET DATE: pt unsure; referral 03/23/2023  REFERRING DIAG: R26.81 (ICD-10-CM) - Gait instability  THERAPY DIAG:  Unsteadiness on feet  Muscle weakness (generalized)  Other abnormalities of gait and mobility  Rationale for Evaluation and Treatment: Rehabilitation  SUBJECTIVE:  SUBJECTIVE STATEMENT: Pt denies recent falls.  She states she has been in and out of the rain today going to different stores looking for something she needs.  She cannot find her cane right now because she cannot find where she last set it down.  She states she has not been doing her HEP. Pt accompanied by: self - drives herself  PERTINENT HISTORY: HTN, HLD, DM2, CKD stage 3, CAD, inferior STEMI, anxiety  PAIN:  Are you having pain? No  PRECAUTIONS: Fall  RED FLAGS: None   WEIGHT BEARING RESTRICTIONS: No  FALLS: Has patient fallen in last 6 months? No  LIVING ENVIRONMENT: Lives with: lives alone Lives in: House/apartment Stairs: Yes: External: 2-3 steps; on right going up Has following equipment at home: Single point cane, Walker - 2 wheeled, Shower bench, and bed side commode  PLOF: Independent  PATIENT GOALS: "Deter getting older and too wobbly."  OBJECTIVE:   DIAGNOSTIC FINDINGS: No recent relevant imaging.  COGNITION: Overall cognitive status: Within functional limits for tasks assessed - pt  tangential   SENSATION: Light touch: WFL  COORDINATION: BLE RAMS:  WNL Bilateral Heel-to-shin:  WNL  EDEMA:  No notable edema, but pt notes a sensation of swelling in the feet  MUSCLE TONE: None noted in bilateral LE during functional assessments.  POSTURE: forward head - mild  LOWER EXTREMITY ROM:     Active  Right Eval Left Eval  Hip flexion Grossly WFL  Hip extension   Hip abduction   Hip adduction   Hip internal rotation   Hip external rotation   Knee flexion   Knee extension   Ankle dorsiflexion   Ankle plantarflexion   Ankle inversion    Ankle eversion     (Blank rows = not tested)  LOWER EXTREMITY MMT:    MMT Right Eval Left Eval  Hip flexion 4-/5 4/5  Hip extension    Hip abduction    Hip adduction    Hip internal rotation    Hip external rotation    Knee flexion    Knee extension 5/5 5/5  Ankle dorsiflexion 2+/5 3/5  Ankle plantarflexion    Ankle inversion    Ankle eversion    (Blank rows = not tested)  BED MOBILITY:  IND per report  TRANSFERS: Assistive device utilized: None  Sit to stand: Modified independence Stand to sit: Modified independence Chair to chair: SBA  GAIT: Gait pattern: step through pattern, decreased arm swing- Right, decreased arm swing- Left, decreased stride length, decreased hip/knee flexion- Right, decreased hip/knee flexion- Left, decreased ankle dorsiflexion- Right, decreased ankle dorsiflexion- Left, and narrow BOS Distance walked: various clinic distances Assistive device utilized: None Level of assistance: SBA Comments: Pt wavers to the right into wall x1 when gazing down putting item into purse, but self-corrects.  FUNCTIONAL TESTS:  5 times sit to stand: 16.37 seconds w/ BUE on knees Timed up and go (TUG): 16.57 seconds no AD SBA 10 meter walk test: 14.19 sec no AD SBA = 0.70 m/sec OR 2.33 ft/sec  PATIENT SURVEYS:  ABC scale To be assessed.  TODAY'S TREATMENT:  DATE: 04/23/2023 -Pt recounts diabetic PMH and concerns about inaccuracy of diagnosis.  PT provides therapeutic listening and redirection. -Lateral stepping on firm surface cone taps w/ color called by therapist - pt has no righting reactions w/ repeated LOB requiring modA to correct from significant lateral and posterior LOB; minimal correction to verbal cues and demo of tap vs excess pressure on cone; frequent redirection to task from perseveration on alternative medical issues -Forward and lateral step overs 4" hurdles progressing away from UE support -SciFit in hill mode x8 minutes progressing to level 4.0 using BUE/BLE support for global strengthening and aerobic tolerance.  Pt needs mod encouragement to engage to task. -Seated hip lift over kettlebell 2x12 each LE -Standing on blue compliant mat step taps x20 alt LE > taps to top step alt LE x20 each side using BUE support SBA -Standing feet together eyes closed x1 minute > feet together firm surface eyes closed manual perturbations x1 minute > feet together firm surface eyes closed head turns x 1 minute, mild to moderate sway SBA all tasks  PATIENT EDUCATION: Education details: HEP compliance - confirmed pt still has her printout.  Verbally reviewed HEP - pt denies need for physical review. Person educated: Patient Education method: Explanation Education comprehension: verbalized understanding  HOME EXERCISE PROGRAM: Access Code: [email or web address] URL: [email or web address] Date: 04/05/2023 Prepared by: Camille Bal  Exercises - Sit to Stand with Arms Crossed  - 1 x daily - 7 x weekly - 2 sets - 10 reps - Side Stepping with Resistance at Thighs and Counter Support  - 1 x daily - 5 x weekly - 3 sets - 10 reps - Forward Backward Monster Walk with Band at Thighs and Counter Support  - 1 x daily - 5 x weekly - 3 sets - 10 reps - Backward Tandem  Walking with Counter Support  - 1 x daily - 5 x weekly - 3 sets - 10 reps - Tandem Walking with Counter Support  - 1 x daily - 5 x weekly - 3 sets - 10 reps  GOALS: Goals reviewed with patient? Yes  SHORT TERM GOALS = LONG TERM GOALS: Target date: 04/23/2023  Pt will be independent and compliant with balance and strength focused HEP in order to maintain functional progress and improve mobility. Baseline:  To be established. Goal status: INITIAL  2.  Pt will decrease 5xSTS to </=12.37 seconds in order to demonstrate decreased risk for falls and improved functional bilateral LE strength and power. Baseline: 16.37 sec w/ BUE support Goal status: INITIAL  3.  Pt will demonstrate TUG of </=12.57 seconds in order to decrease risk of falls and improve functional mobility using LRAD. Baseline: 16.57 sec no AD SBA Goal status: INITIAL  4.  Pt will demonstrate a gait speed of >/=2.66 feet/sec in order to decrease risk for falls. Baseline: 2.33 ft/sec no AD SBA Goal status: INITIAL  5.  GOAL NOT APPROPRIATE AT THIS TIME. Baseline:  92.19% (9/9) Goal status: DISCHARGED  ASSESSMENT:  CLINICAL IMPRESSION: Ongoing balance work today with patient having difficulty with dynamic SLS tasks especially cone taps displaying little to no righting reaction to task resulting in severe LOB.  She endorses she continues to be non-compliant to HEP.  PT may consider re-cert at next session if patient is more compliant to work outside of clinic and is more engaged to task in clinic.  Will assess LTGs at next session.  OBJECTIVE IMPAIRMENTS: Abnormal gait, decreased activity tolerance, decreased balance, decreased endurance, decreased  ROM, decreased strength, and postural dysfunction.   ACTIVITY LIMITATIONS: carrying, lifting, bending, squatting, stairs, and locomotion level  PARTICIPATION LIMITATIONS: shopping, community activity, and yard work  PERSONAL FACTORS: Age, Fitness, Past/current experiences, and  1-2 comorbidities: HTN, DM2  are also affecting patient's functional outcome.   REHAB POTENTIAL: Excellent  CLINICAL DECISION MAKING: Stable/uncomplicated  EVALUATION COMPLEXITY: Low  PLAN:  PT FREQUENCY: 1x/week  PT DURATION: 4 weeks (pt preference)  PLANNED INTERVENTIONS: Therapeutic exercises, Therapeutic activity, Neuromuscular re-education, Balance training, Gait training, Patient/Family education, Self Care, Joint mobilization, Stair training, Vestibular training, Orthotic/Fit training, DME instructions, Manual therapy, and Re-evaluation  PLAN FOR NEXT SESSION:  ASSESS LTGs - re-cert vs discharge?  Sadie Haber, PT, DPT 04/23/2023, 2:10 PM

## 2023-04-30 ENCOUNTER — Encounter: Payer: Self-pay | Admitting: Physical Therapy

## 2023-04-30 ENCOUNTER — Ambulatory Visit: Payer: Medicare Other | Attending: Podiatry | Admitting: Physical Therapy

## 2023-04-30 DIAGNOSIS — R2689 Other abnormalities of gait and mobility: Secondary | ICD-10-CM | POA: Diagnosis present

## 2023-04-30 DIAGNOSIS — M6281 Muscle weakness (generalized): Secondary | ICD-10-CM | POA: Insufficient documentation

## 2023-04-30 DIAGNOSIS — R2681 Unsteadiness on feet: Secondary | ICD-10-CM | POA: Diagnosis present

## 2023-04-30 NOTE — Therapy (Signed)
OUTPATIENT PHYSICAL THERAPY NEURO TREATMENT - DISCHARGE SUMMARY   Patient Name: Alicia Medina MRN: [id number] DOB:1932/08/18, 89 y.o., female Today's Date: 04/30/2023  PCP: None - pt declined PCP finder paper REFERRING PROVIDER: Vivi Barrack, DPM  PHYSICAL THERAPY DISCHARGE SUMMARY  Visits from Start of Care: 5  Current functional level related to goals / functional outcomes: See clinical impression statement.   Remaining deficits: Increased fall risk - recommending cane use   Education / Equipment: HEP compliance - reprinted today due to printout being in lost purse.  Discussed obtaining referral for PT in new year only if pt wants to participate and feels she will do HEP as this is most how she will benefit.  Walking program.  Discouraged pt from practicing step overs to mimic hurdles from prior session as she desires due to severe imbalance noted last session - provided alternate step taps task that poses less risk and similar strength and balance benefit.   Patient agrees to discharge. Patient goals were not met. Patient is being discharged due to  pt preference and no progress noted on re-assessment.  END OF SESSION:  PT End of Session - 04/30/23 1450     Visit Number 5    Number of Visits 5   4 + eval   Date for PT Re-Evaluation 05/07/23   pushed out due to scheduling delay   Authorization Type MEDICARE PART A AND B    Progress Note Due on Visit 10    PT Start Time 1445    PT Stop Time 1523    PT Time Calculation (min) 38 min    Equipment Utilized During Treatment Gait belt    Activity Tolerance Patient tolerated treatment well    Behavior During Therapy WFL for tasks assessed/performed            Past Medical History:  Diagnosis Date   Arthritis    CAD (coronary artery disease) CARDIOLOGIST-  DR [place]   08-28-2011 Inferior STEMI with BMS to the RCA, complicated by cardiogenic shock, VF, VDRF , temporary CHB and abrupt reocclusion of the RCA stent  treated with a second BMS to the RCA. Has residual LAD disease. EF is 50%   Diverticulosis of colon    Hemorrhoids, internal, with bleeding    Hyperlipidemia    Hypertension    Hypothyroidism, postsurgical    S/p bare metal coronary artery stent    2013   Type 2 diabetes mellitus (HCC)    Wears hearing aid    both ears   Past Surgical History:  Procedure Laterality Date   CARDIOVASCULAR STRESS TEST  10-03-2011  dr [place]   normal perfusion / no ischemia/ ef 77%   CARPAL TUNNEL RELEASE Right 01/25/2013   Procedure: CARPAL TUNNEL RELEASE;  Surgeon: Nicki Reaper, MD;  Location: [facility];  Service: Orthopedics;  Laterality: Right;   CARPAL TUNNEL RELEASE Left 10/25/2013   Procedure: LEFT CARPAL TUNNEL RELEASE;  Surgeon: Nicki Reaper, MD;  Location: [facility];  Service: Orthopedics;  Laterality: Left;   CATARACT EXTRACTION W/ INTRAOCULAR LENS  IMPLANT, BILATERAL  2007   COLONOSCOPY  04-27-2003   CORONARY ANGIOGRAM Left 08/28/2011   Procedure: CORONARY ANGIOGRAM;  Surgeon: Peter M [place], MD;  Location: [facility] CATH LAB;  Service: Cardiovascular;  Laterality: Left;   CORONARY ANGIOPLASTY WITH STENT PLACEMENT  08-28-2011  dr [place]   BMS to [facility] with abrupt reocclusion treated with second BMS/  LAD ostial 90%, proximal50-60%, first  diagonal 80-90%   CORONARY STENT INTERVENTION N/A 10/11/2018   Procedure: CORONARY STENT INTERVENTION;  Surgeon: [place], Peter M, MD;  Location: [facility] INVASIVE CV LAB;  Service: Cardiovascular;  Laterality: N/A;   LEFT HEART CATH AND CORONARY ANGIOGRAPHY N/A 10/11/2018   Procedure: LEFT HEART CATH AND CORONARY ANGIOGRAPHY;  Surgeon: [place], Peter M, MD;  Location: [facility] INVASIVE CV LAB;  Service: Cardiovascular;  Laterality: N/A;   LEFT HEART CATHETERIZATION WITH CORONARY ANGIOGRAM N/A 08/28/2011   Procedure: LEFT HEART CATHETERIZATION WITH CORONARY ANGIOGRAM;  Surgeon: Peter M [place], MD;  Location: [facility] CATH LAB;  Service: Cardiovascular;  Laterality:  N/A;   MASS EXCISION Right 08/06/2020   Procedure: EXCISION MASS AND DEBRIDEMENT PROXIMAL INTERPHALANGEAL JOINT RIGHT SMALL FINGER;  Surgeon: Cindee Salt, MD;  Location: [facility];  Service: Orthopedics;  Laterality: Right;  Bier block   PERCUTANEOUS CORONARY STENT INTERVENTION (PCI-S) Left 08/28/2011   Procedure: PERCUTANEOUS CORONARY STENT INTERVENTION (PCI-S);  Surgeon: Peter M [place], MD;  Location: [facility] CATH LAB;  Service: Cardiovascular;  Laterality: Left;   TOTAL THYROIDECTOMY Bilateral 05-02-2007   multinodular goiter   TRANSANAL HEMORRHOIDAL DEARTERIALIZATION N/A 03/28/2014   Procedure: TRANSANAL HEMORRHOIDAL DEARTERIALIZATION;  Surgeon: Romie Levee, MD;  Location: [facility];  Service: General;  Laterality: N/A;   TRANSTHORACIC ECHOCARDIOGRAM  08-30-2011   mild LVH/ ef 50-55%/  grade I diastolic dysfunction   TRIGGER FINGER RELEASE Right 01/25/2013   Procedure: RELEASE TRIGGER FINGER/A-1 PULLEY RIGHT INDEX FINGER;  Surgeon: Nicki Reaper, MD;  Location: [facility];  Service: Orthopedics;  Laterality: Right;   TRIGGER FINGER RELEASE Left 10/25/2013   Procedure: RELEASE A-1 PULLEY LEFT MIDDLE FINGER;  Surgeon: Nicki Reaper, MD;  Location: [facility];  Service: Orthopedics;  Laterality: Left;   TRIGGER FINGER RELEASE Left 12/04/2014   Procedure: RELEASE TRIGGER FINGER/A-1 PULLEY LEFT RING FINGER;  Surgeon: Cindee Salt, MD;  Location: [facility];  Service: Orthopedics;  Laterality: Left;   TRIGGER FINGER RELEASE Right 03/30/2017   Procedure: RELEASE TRIGGER FINGER/A-1 PULLEY RIGHT RING FINGER AND RIGHT SMALL FINGER;  Surgeon: Cindee Salt, MD;  Location: [facility];  Service: Orthopedics;  Laterality: Right;   Patient Active Problem List   Diagnosis Date Noted   Anxiety 11/16/2022   Change in bowel habit 11/16/2022   Diarrhea 11/16/2022   Numbness 07/02/2021   Mucoid cyst, joint 08/14/2020   Osteoarthritis  of finger of right hand 08/14/2020   Ankle pain, chronic 09/26/2019   Pain in right knee 08/30/2019   Pain due to onychomycosis of toenails of both feet 06/28/2019   History of noncompliance with medical treatment 11/10/2018   CAD (coronary artery disease) 11/01/2018   Angina pectoris (HCC) 10/11/2018   Abnormal nuclear stress test 10/11/2018   Diabetes mellitus due to underlying condition with stage 3 chronic kidney disease, without long-term current use of insulin (HCC) 11/29/2017   Hx of thyroidectomy 09/13/2017   Noncompliance with diet and medication regimen 09/13/2017   Stress and adjustment reaction 06/11/2017   Strain of latissimus dorsi muscle 06/11/2017   Diabetes mellitus due to underlying condition with microalbuminuria, with long-term current use of insulin (HCC) 03/11/2017   History of vitamin D deficiency 03/11/2017   Other fatigue 03/11/2017   Abscess 09/03/2016   Hypothyroidism, postsurgical 07/22/2016   Acquired trigger finger 06/15/2016   Hypertension associated with diabetes (HCC) 09/27/2014   Hemorrhoids, internal, with bleeding 05/08/2013   Hyperlipidemia associated with type 2 diabetes mellitus (  HCC) 11/12/2011   Old inferior wall myocardial infarction 08/29/2011    ONSET DATE: pt unsure; referral 03/23/2023  REFERRING DIAG: R26.81 (ICD-10-CM) - Gait instability  THERAPY DIAG:  Unsteadiness on feet  Muscle weakness (generalized)  Other abnormalities of gait and mobility  Rationale for Evaluation and Treatment: Rehabilitation  SUBJECTIVE:                                                                                                                                                                                             SUBJECTIVE STATEMENT: Pt denies recent falls.  She wants to discharge today because she recently lost her purse and all her belongings in it.  She almost could not come except she has a second key for her car.  She also has many things  she is traveling for in coming months and feels the first of the year may be less busy for her.  She also states "I am getting nothing done at home" when asked about HEP. Pt accompanied by: self - drives herself  PERTINENT HISTORY: HTN, HLD, DM2, CKD stage 3, CAD, inferior STEMI, anxiety  PAIN:  Are you having pain? No  PRECAUTIONS: Fall  RED FLAGS: None   WEIGHT BEARING RESTRICTIONS: No  FALLS: Has patient fallen in last 6 months? No  LIVING ENVIRONMENT: Lives with: lives alone Lives in: House/apartment Stairs: Yes: External: 2-3 steps; on right going up Has following equipment at home: Single point cane, Walker - 2 wheeled, Shower bench, and bed side commode  PLOF: Independent  PATIENT GOALS: "Deter getting older and too wobbly."  OBJECTIVE:   DIAGNOSTIC FINDINGS: No recent relevant imaging.  COGNITION: Overall cognitive status: Within functional limits for tasks assessed - pt tangential   SENSATION: Light touch: WFL  COORDINATION: BLE RAMS:  WNL Bilateral Heel-to-shin:  WNL  EDEMA:  No notable edema, but pt notes a sensation of swelling in the feet  MUSCLE TONE: None noted in bilateral LE during functional assessments.  POSTURE: forward head - mild  LOWER EXTREMITY ROM:     Active  Right Eval Left Eval  Hip flexion Grossly WFL  Hip extension   Hip abduction   Hip adduction   Hip internal rotation   Hip external rotation   Knee flexion   Knee extension   Ankle dorsiflexion   Ankle plantarflexion   Ankle inversion    Ankle eversion     (Blank rows = not tested)  LOWER EXTREMITY MMT:    MMT Right Eval Left Eval  Hip flexion 4-/5 4/5  Hip extension    Hip abduction    Hip adduction  Hip internal rotation    Hip external rotation    Knee flexion    Knee extension 5/5 5/5  Ankle dorsiflexion 2+/5 3/5  Ankle plantarflexion    Ankle inversion    Ankle eversion    (Blank rows = not tested)  BED MOBILITY:  IND per  report  TRANSFERS: Assistive device utilized: None  Sit to stand: Modified independence Stand to sit: Modified independence Chair to chair: SBA  GAIT: Gait pattern: step through pattern, decreased arm swing- Right, decreased arm swing- Left, decreased stride length, decreased hip/knee flexion- Right, decreased hip/knee flexion- Left, decreased ankle dorsiflexion- Right, decreased ankle dorsiflexion- Left, and narrow BOS Distance walked: various clinic distances Assistive device utilized: None Level of assistance: SBA Comments: Pt wavers to the right into wall x1 when gazing down putting item into purse, but self-corrects.  FUNCTIONAL TESTS:  5 times sit to stand: 16.37 seconds w/ BUE on knees Timed up and go (TUG): 16.57 seconds no AD SBA 10 meter walk test: 14.19 sec no AD SBA = 0.70 m/sec OR 2.33 ft/sec  PATIENT SURVEYS:  ABC scale To be assessed.  TODAY'S TREATMENT:                                                                                                                              DATE: 04/30/2023 -Verbally reviewed HEP, encouraged pt compliance. -5xSTS:  17.37 sec w/ no UE support -TUG:  17.84 sec no AD SBA - :  13.75 sec no AD SBA = 0.73 m/sec OR 2.4 ft/sec  You Can Walk For A Certain Length Of Time Each Day (walk in home if feels safer and use cane for safety)                          Walk 5 minutes 3-4 times per day.             Increase 2  minutes every week             Work up to 20 minutes continuously (1-2 times per day).               Example:                         Day 1-2           4-5 minutes     3 times per day                         Day 7-8           10-12 minutes 2-3 times per day                         Day 13-14       20-22 minutes 1-2 times per day  PATIENT EDUCATION: Education details: HEP compliance -  reprinted today due to printout being in lost purse.  Discussed obtaining referral for PT in new year only if pt wants to participate and  feels she will do HEP as this is most how she will benefit.  Walking program.  Discouraged pt from practicing step overs to mimic hurdles from prior session as she desires due to severe imbalance noted last session - provided alternate step taps task that poses less risk and similar strength and balance benefit. Person educated: Patient Education method: Explanation Education comprehension: verbalized understanding  HOME EXERCISE PROGRAM: Access Code: [email or web address] URL: [email or web address] Date: 04/05/2023 Prepared by: Camille Bal  Exercises - Sit to Stand with Arms Crossed  - 1 x daily - 7 x weekly - 2 sets - 10 reps - Side Stepping with Resistance at Thighs and Counter Support  - 1 x daily - 5 x weekly - 3 sets - 10 reps - Forward Backward Monster Walk with Band at Thighs and Counter Support  - 1 x daily - 5 x weekly - 3 sets - 10 reps - Backward Tandem Walking with Counter Support  - 1 x daily - 5 x weekly - 3 sets - 10 reps - Tandem Walking with Counter Support  - 1 x daily - 5 x weekly - 3 sets - 10 reps -Can use plastic cup and hold onto sink and practice LE taps  You Can Walk For A Certain Length Of Time Each Day (walk in home if feels safer and use cane for safety)                          Walk 5 minutes 3-4 times per day.             Increase 2  minutes every week             Work up to 20 minutes continuously (1-2 times per day).               Example:                         Day 1-2           4-5 minutes     3 times per day                         Day 7-8           10-12 minutes 2-3 times per day                         Day 13-14       20-22 minutes 1-2 times per day  GOALS: Goals reviewed with patient? Yes  SHORT TERM GOALS = LONG TERM GOALS: Target date: 04/23/2023  Pt will be independent and compliant with balance and strength focused HEP in order to maintain functional progress and improve mobility. Baseline:  Pt not compliant (10/4) Goal status:  NOT MET  2.  Pt will decrease 5xSTS to </=12.37 seconds in order to demonstrate decreased risk for falls and improved functional bilateral LE strength and power. Baseline: 16.37 sec w/ BUE support; 17.37 sec w/ no UE support (10/4) Goal status: NOT MET  3.  Pt will demonstrate TUG of </=12.57 seconds in order to decrease risk of falls and improve functional mobility using LRAD. Baseline: 16.57 sec no AD SBA; 17.84 sec no AD  SBA (10/4) Goal status: NOT MET  4.  Pt will demonstrate a gait speed of >/=2.66 feet/sec in order to decrease risk for falls. Baseline: 2.33 ft/sec no AD SBA; 2.4 ft/sec no AD SBA (10/4) Goal status: NOT MET  5.  GOAL NOT APPROPRIATE AT THIS TIME. Baseline:  92.19% (9/9) Goal status: DISCHARGED  ASSESSMENT:  CLINICAL IMPRESSION: Assessed pt progress this visit with her meeting no goals as written today.  Her 5xSTS and TUG times were a bit longer than attempts prior.  Her gait speed made a non-significant improvement from 2.33 ft/sec to 2.4 ft/sec.  This is largely due to pt being non-participatory in her HEP outside clinic and reporting general inactivity.  At discharge today she states she desires to work on more things when her schedule frees up so provided formal walking program and alternative to advanced hurdle task that would be safe for her to do in combination with consistent HEP performance.  She may obtain a referral for further PT in the new year should she feel the need for re-assessment.  OBJECTIVE IMPAIRMENTS: Abnormal gait, decreased activity tolerance, decreased balance, decreased endurance, decreased ROM, decreased strength, and postural dysfunction.   ACTIVITY LIMITATIONS: carrying, lifting, bending, squatting, stairs, and locomotion level  PARTICIPATION LIMITATIONS: shopping, community activity, and yard work  PERSONAL FACTORS: Age, Fitness, Past/current experiences, and 1-2 comorbidities: HTN, DM2  are also affecting patient's functional outcome.    REHAB POTENTIAL: Excellent  CLINICAL DECISION MAKING: Stable/uncomplicated  EVALUATION COMPLEXITY: Low  PLAN:  PT FREQUENCY: 1x/week  PT DURATION: 4 weeks (pt preference)  PLANNED INTERVENTIONS: Therapeutic exercises, Therapeutic activity, Neuromuscular re-education, Balance training, Gait training, Patient/Family education, Self Care, Joint mobilization, Stair training, Vestibular training, Orthotic/Fit training, DME instructions, Manual therapy, and Re-evaluation  PLAN FOR NEXT SESSION:  N/A  Sadie Haber, PT, DPT 04/30/2023, 3:29 PM

## 2023-04-30 NOTE — Patient Instructions (Signed)
Access Code: [email or web address] URL: [email or web address] Date: 04/05/2023 Prepared by: Camille Bal  Exercises - Sit to Stand with Arms Crossed  - 1 x daily - 7 x weekly - 2 sets - 10 reps - Side Stepping with Resistance at Thighs and Counter Support  - 1 x daily - 5 x weekly - 3 sets - 10 reps - Forward Backward Monster Walk with Band at Thighs and Counter Support  - 1 x daily - 5 x weekly - 3 sets - 10 reps - Backward Tandem Walking with Counter Support  - 1 x daily - 5 x weekly - 3 sets - 10 reps - Tandem Walking with Counter Support  - 1 x daily - 5 x weekly - 3 sets - 10 reps -Can use plastic cup and hold onto sink and practice LE taps  You Can Walk For A Certain Length Of Time Each Day (walk in home if feels safer and use cane for safety)                          Walk 5 minutes 3-4 times per day.             Increase 2  minutes every week             Work up to 20 minutes continuously (1-2 times per day).               Example:                         Day 1-2           4-5 minutes     3 times per day                         Day 7-8           10-12 minutes 2-3 times per day                         Day 13-14       20-22 minutes 1-2 times per day

## 2023-05-03 ENCOUNTER — Ambulatory Visit (INDEPENDENT_AMBULATORY_CARE_PROVIDER_SITE_OTHER): Payer: Medicare Other | Admitting: Endocrinology

## 2023-05-03 ENCOUNTER — Encounter: Payer: Self-pay | Admitting: Endocrinology

## 2023-05-03 VITALS — BP 112/60 | HR 85 | Resp 20 | Ht 63.0 in | Wt 141.6 lb

## 2023-05-03 DIAGNOSIS — E890 Postprocedural hypothyroidism: Secondary | ICD-10-CM | POA: Diagnosis not present

## 2023-05-03 DIAGNOSIS — N1831 Chronic kidney disease, stage 3a: Secondary | ICD-10-CM | POA: Diagnosis not present

## 2023-05-03 DIAGNOSIS — Z7985 Long-term (current) use of injectable non-insulin antidiabetic drugs: Secondary | ICD-10-CM | POA: Diagnosis not present

## 2023-05-03 DIAGNOSIS — E1122 Type 2 diabetes mellitus with diabetic chronic kidney disease: Secondary | ICD-10-CM | POA: Diagnosis not present

## 2023-05-03 LAB — POCT GLYCOSYLATED HEMOGLOBIN (HGB A1C): Hemoglobin A1C: 9.4 % — AB (ref 4.0–5.6)

## 2023-05-03 MED ORDER — TRULICITY 3 MG/0.5ML ~~LOC~~ SOAJ: 3.0000 mg | SUBCUTANEOUS | 3 refills | Status: DC

## 2023-05-03 NOTE — Patient Instructions (Signed)
Trulicity 3 mg weekly, prescription sent.  Lab today.

## 2023-05-03 NOTE — Progress Notes (Signed)
Outpatient Endocrinology Note [place] Alexei Ey, MD  05/03/23  Patient's Name: Alicia Medina    DOB: April 25, 1933    MRN: [id number]                                                    REASON OF VISIT: Follow up for type 2 diabetes mellitus /postsurgical hypothyroidism  PCP: Pcp, No  HISTORY OF PRESENT ILLNESS:   Alicia Medina is a 89 y.o. old female with past medical history listed below, is here for evaluation / follow up of type 2 diabetes mellitus /postsurgical hypothyroidism.  Patient was last seen by Dr. Everardo All in March 2023.  Pertinent Diabetes History: Patient was diagnosed with type 2 diabetes mellitus in 2008. Insulin therapy started in 2013, not fully compliant.   Patient believes and she thinks he does not have diabetes mellitus.  None for family members blood related has diabetes mellitus, which makes her hard to believe that she has type 2 diabetes mellitus.  She has uncontrolled type 2 diabetes mellitus.  Chronic Diabetes Complications : Retinopathy: no. Last ophthalmology exam was done on annually, reportedly. Nephropathy: CKD IIIa, on losartan Peripheral neuropathy: yes, on gabapentin , following with podiatry.  Coronary artery disease: CAD Stroke: no  Relevant comorbidities and cardiovascular risk factors: Obesity: no Body mass index is 25.08 kg/m.  Hypertension: yes Hyperlipidemia. Yes, on statin.   Current / Home Diabetic regimen includes: Trulicity 3 mg weekly, not fully compliant lately taking every 7 to 14 days.  Prior diabetic medications: Metformin diarrhea.  Ozempic diarrhea. She had been on insulin therapy in the past Lantus, Levemir, NPH.  She declined multiple daily injections in the past.  Glycemic data:   Glucose log review, she has been checking glucose occasionally, some of the glucose last 3 months are 235, 200, 220, 215, 242, 195, 258, 235, 242, 193.  [place] in May, 2024.   Hypoglycemia: Patient has no hypoglycemic episodes. Patient has  hypoglycemia awareness.  Factors modifying glucose control: 1.  Diabetic diet assessment: 3 meals a day, sometimes candy.   2.  Staying active or exercising: no formal exercise.   3.  Medication compliance: compliant some of the time.  # Postsurgical hypothyroidism : Patient had multinodular goiter status post total thyroidectomy in October 2008, pathology benign, no evidence of malignancy.  She has been on Synthroid since then, compliance had been issue in the past.  Interval history 05/03/23 Patient was last seen by Dr. Everardo All in March 2023.  Patient reports he has not been fully compliant with diabetes medication.  It has been hard for her to believe that she had diabetes mellitus, she thinks he does not have diabetes mellitus.  In May 2024 she went to [place], did not take any diabetic medication however blood sugar were in the 200s.  After that she started taking Trulicity however not every week she has been taking from 7 to 14 days interval.  She does not recall taking any insulin.  She thinks Trulicity is the insulin.  She has been taking levothyroxine 175 mcg daily.  She does not recall the dose.  She mostly takes every day however she remembers does not necessarily in the empty stomach.  She had severe hypothyroidism with TSH of 77 in April 2024.  She admits that she is at times not compliant, she  ran out of the medicine and does not refill on time.  She feels overall good energy.  Denies palpitation or heat intolerance.  Denies change in bowel habit.  No other complaints today.  Hemoglobin A1c 9.4 today, has improved from 10.6 in April 2024.  REVIEW OF SYSTEMS As per history of present illness.   PAST MEDICAL HISTORY: Past Medical History:  Diagnosis Date   Arthritis    CAD (coronary artery disease) CARDIOLOGIST-  DR [place]   08-28-2011 Inferior STEMI with BMS to the RCA, complicated by cardiogenic shock, VF, VDRF , temporary CHB and abrupt reocclusion of the RCA stent treated  with a second BMS to the RCA. Has residual LAD disease. EF is 50%   Diverticulosis of colon    Hemorrhoids, internal, with bleeding    Hyperlipidemia    Hypertension    Hypothyroidism, postsurgical    S/p bare metal coronary artery stent    2013   Type 2 diabetes mellitus (HCC)    Wears hearing aid    both ears    PAST SURGICAL HISTORY: Past Surgical History:  Procedure Laterality Date   CARDIOVASCULAR STRESS TEST  10-03-2011  dr [place]   normal perfusion / no ischemia/ ef 77%   CARPAL TUNNEL RELEASE Right 01/25/2013   Procedure: CARPAL TUNNEL RELEASE;  Surgeon: Nicki Reaper, MD;  Location: [facility];  Service: Orthopedics;  Laterality: Right;   CARPAL TUNNEL RELEASE Left 10/25/2013   Procedure: LEFT CARPAL TUNNEL RELEASE;  Surgeon: Nicki Reaper, MD;  Location: [facility];  Service: Orthopedics;  Laterality: Left;   CATARACT EXTRACTION W/ INTRAOCULAR LENS  IMPLANT, BILATERAL  2007   COLONOSCOPY  04-27-2003   CORONARY ANGIOGRAM Left 08/28/2011   Procedure: CORONARY ANGIOGRAM;  Surgeon: Peter M [place], MD;  Location: [facility] CATH LAB;  Service: Cardiovascular;  Laterality: Left;   CORONARY ANGIOPLASTY WITH STENT PLACEMENT  08-28-2011  dr [place]   BMS to [facility] with abrupt reocclusion treated with second BMS/  LAD ostial 90%, proximal50-60%, first diagonal 80-90%   CORONARY STENT INTERVENTION N/A 10/11/2018   Procedure: CORONARY STENT INTERVENTION;  Surgeon: [place], Peter M, MD;  Location: [facility] INVASIVE CV LAB;  Service: Cardiovascular;  Laterality: N/A;   LEFT HEART CATH AND CORONARY ANGIOGRAPHY N/A 10/11/2018   Procedure: LEFT HEART CATH AND CORONARY ANGIOGRAPHY;  Surgeon: [place], Peter M, MD;  Location: [facility] INVASIVE CV LAB;  Service: Cardiovascular;  Laterality: N/A;   LEFT HEART CATHETERIZATION WITH CORONARY ANGIOGRAM N/A 08/28/2011   Procedure: LEFT HEART CATHETERIZATION WITH CORONARY ANGIOGRAM;  Surgeon: Peter M [place], MD;  Location: [facility] CATH LAB;  Service:  Cardiovascular;  Laterality: N/A;   MASS EXCISION Right 08/06/2020   Procedure: EXCISION MASS AND DEBRIDEMENT PROXIMAL INTERPHALANGEAL JOINT RIGHT SMALL FINGER;  Surgeon: Cindee Salt, MD;  Location: [facility];  Service: Orthopedics;  Laterality: Right;  Bier block   PERCUTANEOUS CORONARY STENT INTERVENTION (PCI-S) Left 08/28/2011   Procedure: PERCUTANEOUS CORONARY STENT INTERVENTION (PCI-S);  Surgeon: Peter M [place], MD;  Location: [facility] CATH LAB;  Service: Cardiovascular;  Laterality: Left;   TOTAL THYROIDECTOMY Bilateral 05-02-2007   multinodular goiter   TRANSANAL HEMORRHOIDAL DEARTERIALIZATION N/A 03/28/2014   Procedure: TRANSANAL HEMORRHOIDAL DEARTERIALIZATION;  Surgeon: Romie Levee, MD;  Location: [facility];  Service: General;  Laterality: N/A;   TRANSTHORACIC ECHOCARDIOGRAM  08-30-2011   mild LVH/ ef 50-55%/  grade I diastolic dysfunction   TRIGGER FINGER RELEASE Right 01/25/2013   Procedure: RELEASE TRIGGER FINGER/A-1 PULLEY  RIGHT INDEX FINGER;  Surgeon: Nicki Reaper, MD;  Location: [facility];  Service: Orthopedics;  Laterality: Right;   TRIGGER FINGER RELEASE Left 10/25/2013   Procedure: RELEASE A-1 PULLEY LEFT MIDDLE FINGER;  Surgeon: Nicki Reaper, MD;  Location: [facility];  Service: Orthopedics;  Laterality: Left;   TRIGGER FINGER RELEASE Left 12/04/2014   Procedure: RELEASE TRIGGER FINGER/A-1 PULLEY LEFT RING FINGER;  Surgeon: Cindee Salt, MD;  Location: [facility];  Service: Orthopedics;  Laterality: Left;   TRIGGER FINGER RELEASE Right 03/30/2017   Procedure: RELEASE TRIGGER FINGER/A-1 PULLEY RIGHT RING FINGER AND RIGHT SMALL FINGER;  Surgeon: Cindee Salt, MD;  Location:  SURGERY CENTER;  Service: Orthopedics;  Laterality: Right;    ALLERGIES: Allergies  Allergen Reactions   Demeclocycline Other (See Comments)    Severe stomach cramps Severe stomach cramps Severe stomach cramps   Lisinopril Cough    Tetracyclines & Related Other (See Comments)    Severe stomach cramps    FAMILY HISTORY:  Family History  Problem Relation Age of Onset   Stroke Mother    Cancer Mother        breast   Cancer Father        lung   Stroke Father    Diabetes Neg Hx     SOCIAL HISTORY: Social History   Socioeconomic History   Marital status: Married    Spouse name: Not on file   Number of children: Not on file   Years of education: Not on file   Highest education level: Not on file  Occupational History   Not on file  Tobacco Use   Smoking status: Never   Smokeless tobacco: Never  Vaping Use   Vaping status: Never Used  Substance and Sexual Activity   Alcohol use: Yes    Alcohol/week: 1.0 standard drink of alcohol    Types: 1 Glasses of wine per week    Comment: social   Drug use: No   Sexual activity: Not on file  Other Topics Concern   Not on file  Social History Narrative   Lives with husband.   Has 2 sons- both healthy.   Younger son lives in [place], [place] in order son lives in [place].   Social Determinants of Health   Financial Resource Strain: Not on file  Food Insecurity: Not on file  Transportation Needs: Not on file  Physical Activity: Not on file  Stress: Not on file  Social Connections: Not on file    MEDICATIONS:  Current Outpatient Medications  Medication Sig Dispense Refill   acetaminophen (TYLENOL) 500 MG tablet Take 1,000 mg by mouth at bedtime.     Ascorbic Acid (VITAMIN C) 1000 MG tablet Take 1,000 mg by mouth every evening.     aspirin EC 81 MG tablet Take 81 mg by mouth daily.     calcium carbonate (SUPER CALCIUM) 1500 (600 Ca) MG TABS tablet Take by mouth.     Cholecalciferol (VITAMIN D-3) 125 MCG (5000 UT) TABS Take 5,000 Units by mouth daily.     clopidogrel (PLAVIX) 75 MG tablet      Cyanocobalamin 2500 MCG TABS Take by mouth.     gabapentin (NEURONTIN) 100 MG capsule Take 1 capsule (100 mg total) by mouth at bedtime. 90 capsule 0   gabapentin  (NEURONTIN) 100 MG capsule Take 1 capsule (100 mg total) by mouth 2 (two) times daily. 180 capsule 0   glucose blood (TRUE METRIX BLOOD  GLUCOSE TEST) test strip 1 each by Other route 2 (two) times daily. And lancets 2/day 200 each 3   loperamide (IMODIUM A-D) 2 MG tablet Take 1-2 mg by mouth as directed.     loratadine (CLARITIN) 10 MG tablet Take 10 mg by mouth at bedtime.     losartan (COZAAR) 25 MG tablet Take 1 tablet (25 mg total) by mouth every morning. 90 tablet 3   metoprolol tartrate (LOPRESSOR) 50 MG tablet Take 1 tablet (50 mg total) by mouth 2 (two) times daily. 180 tablet 3   potassium chloride SA (KLOR-CON M) 20 MEQ tablet Take by mouth.     rosuvastatin (CRESTOR) 20 MG tablet Take 1 tablet (20 mg total) by mouth daily. 90 tablet 3   S-Adenosylmethionine 400 MG TABS Take by mouth.     SYNTHROID 175 MCG tablet Take 1 tablet (175 mcg total) by mouth daily before breakfast. 90 tablet 1   traMADol (ULTRAM) 50 MG tablet Take by mouth.     Vitamin E 500 UNIT/GM POWD Take by mouth.     Dulaglutide (TRULICITY) 3 MG/0.5ML SOPN Inject 3 mg as directed once a week. 6 mL 3   No current facility-administered medications for this visit.    PHYSICAL EXAM: Vitals:   05/03/23 1405  BP: 112/60  Pulse: 85  Resp: 20  SpO2: 93%  Weight: 141 lb 9.6 oz (64.2 kg)  Height: 5\' 3"  (1.6 m)   Body mass index is 25.08 kg/m.  Wt Readings from Last 3 Encounters:  05/03/23 141 lb 9.6 oz (64.2 kg)  11/05/22 150 lb (68 kg)  02/13/22 144 lb 6.4 oz (65.5 kg)    General: Well developed, well nourished female in no apparent distress.  HEENT: AT/[place], no external lesions.  Eyes: Conjunctiva clear and no icterus. Neck: Neck supple. Anterior neck scar + s/p thyroidectomy.  Lungs: Respirations not labored Neurologic: Alert, oriented, normal speech Extremities / Skin: Dry. No pedal edema.  Psychiatric: Does not appear depressed or anxious  Diabetic Foot Exam - Simple   No data filed    LABS  Reviewed Lab Results  Component Value Date   HGBA1C 9.4 (A) 05/03/2023   HGBA1C 10.6 (H) 11/05/2022   HGBA1C 8.5 (A) 10/01/2021   No results found for: "FRUCTOSAMINE" Lab Results  Component Value Date   CHOL 171 11/05/2022   HDL 63 11/05/2022   LDLCALC 51 11/05/2022   TRIG 381 (H) 11/05/2022   CHOLHDL 2.7 11/05/2022   Lab Results  Component Value Date   MICRALBCREAT 30-300 11/29/2017   MICRALBCREAT 30-300 09/03/2016   Lab Results  Component Value Date   CREATININE 1.20 (H) 11/05/2022   Lab Results  Component Value Date   GFR 49.25 (L) 07/02/2021    ASSESSMENT / PLAN  1. Type 2 diabetes mellitus with stage 3a chronic kidney disease, without long-term current use of insulin (HCC)   2. Postsurgical hypothyroidism   3. H/O total thyroidectomy     Diabetes Mellitus type 2, complicated by diabetic neuropathy/CKD. - Diabetic status / severity: Uncontrolled  Lab Results  Component Value Date   HGBA1C 9.4 (A) 05/03/2023    - Hemoglobin A1c goal : <8%  Discussed in detail that she has type 2 diabetes mellitus.  Although there is no family history of diabetes mellitus it can happen sporadically as well and depends over lifestyles and so many other factors.  Discussed in detail about compliance with diabetic medication.  Discussed in detail about consequences of uncontrolled diabetes  mellitus in relation to chronic diabetic complications.  She has not been fully compliant with Trulicity.  At this time we will optimize treatment with Trulicity.  If needed will consider oral antidiabetic medication in the future visit.  - Medications:   I) continue Trulicity 3 mg weekly.  - Home glucose testing: Check in the morning before breakfast and bedtime. - Discussed/ Gave Hypoglycemia treatment plan.  # Consult : not required at this time.   # Annual urine for microalbuminuria/ creatinine ratio, no microalbuminuria currently, continue ACE/ARB /losartan, check today. Last  Lab  Results  Component Value Date   MICRALBCREAT 30-300 11/29/2017    # Foot check nightly / neuropathy, continue gabapentin.  Following with podiatry.  # Annual dilated diabetic eye exams.   - Diet: Make healthy diabetic food choices/asked to avoid candies. - Life style / activity / exercise: Discussed.  2. Blood pressure  -  BP Readings from Last 1 Encounters:  05/03/23 112/60    - Control is in target.  - No change in current plans.  3. Lipid status / Hyperlipidemia - Last  Lab Results  Component Value Date   LDLCALC 51 11/05/2022   - Continue rosuvastatin 20 mg daily.  # Postsurgical hypothyroidism status post total thyroidectomy in 2008. -Currently taking levothyroxine 175 mcg daily.  Compliance had been issue in the past. -Advised to take levothyroxine the morning before breakfast in empty stomach.  If she forgets okay to take double dose next day. -Check thyroid function test today.   Diagnoses and all orders for this visit:  Type 2 diabetes mellitus with stage 3a chronic kidney disease, without long-term current use of insulin (HCC) -     POCT glycosylated hemoglobin (Hb A1C) -     Microalbumin / creatinine urine ratio; Future -     Microalbumin / creatinine urine ratio  Postsurgical hypothyroidism -     T4, free; Future -     TSH; Future -     TSH -     T4, free  H/O total thyroidectomy  Other orders -     Dulaglutide (TRULICITY) 3 MG/0.5ML SOPN; Inject 3 mg as directed once a week.    DISPOSITION Follow up in clinic in 6 weeks suggested.   All questions answered and patient verbalized understanding of the plan.  [place] Rochel Privett, MD [facility] [address] [place], [address] [place], [place] [address] Phone # [phone]  At least part of this note was generated using voice recognition software. Inadvertent word errors may have occurred, which were not recognized during the proofreading process.  Labs reviewed TSH is  improving however is still high.  Normal free T4.  She is not fully compliant with current dose of levothyroxine 175 mcg daily.  I will not increase the dose at this time.  Discussed about compliance in the clinic visit.  I would like to recheck TSH, free T4 in 6 weeks.   Latest Reference Range & Units 05/03/23 14:57  TSH 0.35 - 5.50 uIU/mL 29.85 (H)  T4,Free(Direct) 0.60 - 1.60 ng/dL 9.14  (H): Data is abnormally high

## 2023-05-04 LAB — TSH: TSH: 29.85 u[IU]/mL — ABNORMAL HIGH (ref 0.35–5.50)

## 2023-05-04 LAB — MICROALBUMIN / CREATININE URINE RATIO
Creatinine,U: 71 mg/dL
Microalb Creat Ratio: 15 mg/g (ref 0.0–30.0)
Microalb, Ur: 10.7 mg/dL — ABNORMAL HIGH (ref 0.0–1.9)

## 2023-05-04 LAB — T4, FREE: Free T4: 1.04 ng/dL (ref 0.60–1.60)

## 2023-05-05 NOTE — Addendum Note (Signed)
Addended by: Laela Deviney, [place] on: 05/05/2023 12:44 PM   Modules accepted: Orders

## 2023-05-10 ENCOUNTER — Ambulatory Visit
Admission: RE | Admit: 2023-05-10 | Discharge: 2023-05-10 | Disposition: A | Discharge status: Home / Self Care | Payer: Medicare Other | Source: Ambulatory Visit | Attending: Obstetrics and Gynecology | Admitting: Obstetrics and Gynecology

## 2023-05-10 DIAGNOSIS — Z0000 Encounter for general adult medical examination without abnormal findings: Secondary | ICD-10-CM

## 2023-06-15 ENCOUNTER — Other Ambulatory Visit: Payer: Self-pay | Admitting: Cardiology

## 2023-06-15 ENCOUNTER — Other Ambulatory Visit (INDEPENDENT_AMBULATORY_CARE_PROVIDER_SITE_OTHER): Payer: Medicare Other

## 2023-06-15 DIAGNOSIS — E890 Postprocedural hypothyroidism: Secondary | ICD-10-CM | POA: Diagnosis not present

## 2023-06-15 LAB — T4, FREE: Free T4: 1.46 ng/dL (ref 0.60–1.60)

## 2023-06-15 LAB — TSH: TSH: 2.19 u[IU]/mL (ref 0.35–5.50)

## 2023-06-16 ENCOUNTER — Telehealth: Payer: Self-pay

## 2023-06-16 NOTE — Telephone Encounter (Signed)
-----   Message from [place] Thapa sent at 06/16/2023  3:21 PM EST ----- Please notify patient of normal thyroid function test continue current dose of levothyroxine 175 mcg daily.  No change.

## 2023-06-16 NOTE — Telephone Encounter (Signed)
VM left  to give results of labs.

## 2023-10-03 ENCOUNTER — Emergency Department

## 2023-10-03 ENCOUNTER — Inpatient Hospital Stay
Admission: EM | Admit: 2023-10-03 | Discharge: 2023-10-05 | DRG: 638 | Disposition: A | Discharge status: Home / Self Care | Attending: Internal Medicine | Admitting: Internal Medicine

## 2023-10-03 ENCOUNTER — Other Ambulatory Visit: Payer: Self-pay

## 2023-10-03 DIAGNOSIS — E039 Hypothyroidism, unspecified: Secondary | ICD-10-CM | POA: Diagnosis not present

## 2023-10-03 DIAGNOSIS — Z974 Presence of external hearing-aid: Secondary | ICD-10-CM

## 2023-10-03 DIAGNOSIS — E1140 Type 2 diabetes mellitus with diabetic neuropathy, unspecified: Secondary | ICD-10-CM | POA: Diagnosis present

## 2023-10-03 DIAGNOSIS — E785 Hyperlipidemia, unspecified: Secondary | ICD-10-CM | POA: Diagnosis present

## 2023-10-03 DIAGNOSIS — I10 Essential (primary) hypertension: Secondary | ICD-10-CM | POA: Diagnosis present

## 2023-10-03 DIAGNOSIS — Z79890 Hormone replacement therapy (postmenopausal): Secondary | ICD-10-CM

## 2023-10-03 DIAGNOSIS — Z961 Presence of intraocular lens: Secondary | ICD-10-CM | POA: Diagnosis present

## 2023-10-03 DIAGNOSIS — Z91119 Patient's noncompliance with dietary regimen due to unspecified reason: Secondary | ICD-10-CM

## 2023-10-03 DIAGNOSIS — Z91128 Patient's intentional underdosing of medication regimen for other reason: Secondary | ICD-10-CM | POA: Diagnosis not present

## 2023-10-03 DIAGNOSIS — Z9841 Cataract extraction status, right eye: Secondary | ICD-10-CM | POA: Diagnosis not present

## 2023-10-03 DIAGNOSIS — E1165 Type 2 diabetes mellitus with hyperglycemia: Secondary | ICD-10-CM | POA: Diagnosis present

## 2023-10-03 DIAGNOSIS — Z794 Long term (current) use of insulin: Secondary | ICD-10-CM | POA: Diagnosis not present

## 2023-10-03 DIAGNOSIS — Z888 Allergy status to other drugs, medicaments and biological substances status: Secondary | ICD-10-CM

## 2023-10-03 DIAGNOSIS — E860 Dehydration: Secondary | ICD-10-CM | POA: Diagnosis present

## 2023-10-03 DIAGNOSIS — E1100 Type 2 diabetes mellitus with hyperosmolarity without nonketotic hyperglycemic-hyperosmolar coma (NKHHC): Secondary | ICD-10-CM | POA: Diagnosis present

## 2023-10-03 DIAGNOSIS — T383X6A Underdosing of insulin and oral hypoglycemic [antidiabetic] drugs, initial encounter: Secondary | ICD-10-CM | POA: Diagnosis present

## 2023-10-03 DIAGNOSIS — Z9842 Cataract extraction status, left eye: Secondary | ICD-10-CM | POA: Diagnosis not present

## 2023-10-03 DIAGNOSIS — D649 Anemia, unspecified: Secondary | ICD-10-CM | POA: Diagnosis present

## 2023-10-03 DIAGNOSIS — Z79899 Other long term (current) drug therapy: Secondary | ICD-10-CM

## 2023-10-03 DIAGNOSIS — R4182 Altered mental status, unspecified: Secondary | ICD-10-CM | POA: Diagnosis present

## 2023-10-03 DIAGNOSIS — E890 Postprocedural hypothyroidism: Secondary | ICD-10-CM | POA: Diagnosis present

## 2023-10-03 DIAGNOSIS — N390 Urinary tract infection, site not specified: Secondary | ICD-10-CM | POA: Diagnosis present

## 2023-10-03 DIAGNOSIS — I252 Old myocardial infarction: Secondary | ICD-10-CM

## 2023-10-03 DIAGNOSIS — Z79891 Long term (current) use of opiate analgesic: Secondary | ICD-10-CM

## 2023-10-03 DIAGNOSIS — Z7985 Long-term (current) use of injectable non-insulin antidiabetic drugs: Secondary | ICD-10-CM

## 2023-10-03 DIAGNOSIS — Z7982 Long term (current) use of aspirin: Secondary | ICD-10-CM | POA: Diagnosis not present

## 2023-10-03 DIAGNOSIS — M1990 Unspecified osteoarthritis, unspecified site: Secondary | ICD-10-CM | POA: Diagnosis present

## 2023-10-03 DIAGNOSIS — Z1152 Encounter for screening for COVID-19: Secondary | ICD-10-CM

## 2023-10-03 DIAGNOSIS — Z955 Presence of coronary angioplasty implant and graft: Secondary | ICD-10-CM

## 2023-10-03 DIAGNOSIS — I2510 Atherosclerotic heart disease of native coronary artery without angina pectoris: Secondary | ICD-10-CM | POA: Diagnosis present

## 2023-10-03 DIAGNOSIS — Z881 Allergy status to other antibiotic agents status: Secondary | ICD-10-CM

## 2023-10-03 LAB — BASIC METABOLIC PANEL
Anion gap: 10 (ref 5–15)
BUN: 32 mg/dL — ABNORMAL HIGH (ref 8–23)
CO2: 23 mmol/L (ref 22–32)
Calcium: 8.3 mg/dL — ABNORMAL LOW (ref 8.9–10.3)
Chloride: 104 mmol/L (ref 98–111)
Creatinine, Ser: 0.77 mg/dL (ref 0.44–1.00)
GFR, Estimated: >60 mL/min (ref >60)
Glucose, Bld: 200 mg/dL — ABNORMAL HIGH (ref 70–99)
Potassium: 3.7 mmol/L (ref 3.5–5.1)
Sodium: 135 mmol/L (ref 135–145)

## 2023-10-03 LAB — CBC WITH DIFFERENTIAL/PLATELET
Abs Immature Granulocytes: 0.03 10*3/uL (ref 0.00–0.07)
Basophils Absolute: 0 10*3/uL (ref 0.0–0.1)
Basophils Relative: 1 %
Eosinophils Absolute: 0.2 10*3/uL (ref 0.0–0.5)
Eosinophils Relative: 3 %
HCT: 35.2 % — ABNORMAL LOW (ref 36.0–46.0)
Hemoglobin: 11.6 g/dL — ABNORMAL LOW (ref 12.0–15.0)
Immature Granulocytes: 0 %
Lymphocytes Relative: 23 %
Lymphs Abs: 1.7 10*3/uL (ref 0.7–4.0)
MCH: 28.7 pg (ref 26.0–34.0)
MCHC: 33 g/dL (ref 30.0–36.0)
MCV: 87.1 fL (ref 80.0–100.0)
Monocytes Absolute: 1.3 10*3/uL — ABNORMAL HIGH (ref 0.1–1.0)
Monocytes Relative: 18 %
Neutro Abs: 4 10*3/uL (ref 1.7–7.7)
Neutrophils Relative %: 55 %
Platelets: 313 10*3/uL (ref 150–400)
RBC: 4.04 MIL/uL (ref 3.87–5.11)
RDW: 13.2 % (ref 11.5–15.5)
WBC: 7.2 10*3/uL (ref 4.0–10.5)
nRBC: 0 % (ref 0.0–0.2)

## 2023-10-03 LAB — GLUCOSE, CAPILLARY
Glucose-Capillary: 349 mg/dL — ABNORMAL HIGH (ref 70–99)
Glucose-Capillary: 181 mg/dL — ABNORMAL HIGH (ref 70–99)
Glucose-Capillary: 219 mg/dL — ABNORMAL HIGH (ref 70–99)

## 2023-10-03 LAB — URINALYSIS, W/ REFLEX TO CULTURE (INFECTION SUSPECTED)
Bilirubin Urine: NEGATIVE
Glucose, UA: >=500 mg/dL — AB
Hgb urine dipstick: NEGATIVE
Ketones, ur: NEGATIVE mg/dL
Nitrite: NEGATIVE
Protein, ur: NEGATIVE mg/dL
Specific Gravity, Urine: 1.021 (ref 1.005–1.030)
WBC, UA: >50 WBC/hpf (ref 0–5)
pH: 5 (ref 5.0–8.0)

## 2023-10-03 LAB — TROPONIN I (HIGH SENSITIVITY)
Troponin I (High Sensitivity): 6 ng/L (ref <18)
Troponin I (High Sensitivity): 7 ng/L (ref <18)

## 2023-10-03 LAB — CBC
HCT: 30.7 % — ABNORMAL LOW (ref 36.0–46.0)
Hemoglobin: 10.4 g/dL — ABNORMAL LOW (ref 12.0–15.0)
MCH: 28.5 pg (ref 26.0–34.0)
MCHC: 33.9 g/dL (ref 30.0–36.0)
MCV: 84.1 fL (ref 80.0–100.0)
Platelets: 288 10*3/uL (ref 150–400)
RBC: 3.65 MIL/uL — ABNORMAL LOW (ref 3.87–5.11)
RDW: 13.2 % (ref 11.5–15.5)
WBC: 7.3 10*3/uL (ref 4.0–10.5)
nRBC: 0 % (ref 0.0–0.2)

## 2023-10-03 LAB — CBG MONITORING, ED
Glucose-Capillary: 201 mg/dL — ABNORMAL HIGH (ref 70–99)
Glucose-Capillary: 225 mg/dL — ABNORMAL HIGH (ref 70–99)
Glucose-Capillary: >600 mg/dL (ref 70–99)

## 2023-10-03 LAB — COMPREHENSIVE METABOLIC PANEL
ALT: 15 U/L (ref 0–44)
AST: 15 U/L (ref 15–41)
Albumin: 3.5 g/dL (ref 3.5–5.0)
Alkaline Phosphatase: 71 U/L (ref 38–126)
Anion gap: 8 (ref 5–15)
BUN: 42 mg/dL — ABNORMAL HIGH (ref 8–23)
CO2: 24 mmol/L (ref 22–32)
Calcium: 8.7 mg/dL — ABNORMAL LOW (ref 8.9–10.3)
Chloride: 95 mmol/L — ABNORMAL LOW (ref 98–111)
Creatinine, Ser: 0.98 mg/dL (ref 0.44–1.00)
GFR, Estimated: 56 mL/min — ABNORMAL LOW (ref >60)
Glucose, Bld: 618 mg/dL (ref 70–99)
Potassium: 4.2 mmol/L (ref 3.5–5.1)
Sodium: 127 mmol/L — ABNORMAL LOW (ref 135–145)
Total Bilirubin: 0.4 mg/dL (ref 0.0–1.2)
Total Protein: 7.5 g/dL (ref 6.5–8.1)

## 2023-10-03 LAB — BLOOD GAS, VENOUS
Acid-Base Excess: 2.3 mmol/L — ABNORMAL HIGH (ref 0.0–2.0)
Bicarbonate: 26.5 mmol/L (ref 20.0–28.0)
O2 Saturation: 91.1 %
Patient temperature: 37
pCO2, Ven: 39 mmHg — ABNORMAL LOW (ref 44–60)
pH, Ven: 7.44 — ABNORMAL HIGH (ref 7.25–7.43)
pO2, Ven: 58 mmHg — ABNORMAL HIGH (ref 32–45)

## 2023-10-03 LAB — RESP PANEL BY RT-PCR (RSV, FLU A&B, COVID)  RVPGX2
Influenza A by PCR: NEGATIVE
Influenza B by PCR: NEGATIVE
Resp Syncytial Virus by PCR: NEGATIVE
SARS Coronavirus 2 by RT PCR: NEGATIVE

## 2023-10-03 LAB — T4, FREE: Free T4: 1.63 ng/dL — ABNORMAL HIGH (ref 0.61–1.12)

## 2023-10-03 LAB — APTT: aPTT: 25 s (ref 24–36)

## 2023-10-03 LAB — PROTIME-INR
INR: 1 (ref 0.8–1.2)
Prothrombin Time: 13.3 s (ref 11.4–15.2)

## 2023-10-03 LAB — BETA-HYDROXYBUTYRIC ACID: Beta-Hydroxybutyric Acid: 0.25 mmol/L (ref 0.05–0.27)

## 2023-10-03 LAB — OSMOLALITY: Osmolality: 300 mosm/kg — ABNORMAL HIGH (ref 275–295)

## 2023-10-03 LAB — TSH: TSH: 1.704 u[IU]/mL (ref 0.350–4.500)

## 2023-10-03 LAB — LACTIC ACID, PLASMA: Lactic Acid, Venous: 1.6 mmol/L (ref 0.5–1.9)

## 2023-10-03 MED ORDER — LACTATED RINGERS IV SOLN
INTRAVENOUS | Status: DC
Start: 2023-10-03 — End: 2023-10-03

## 2023-10-03 MED ORDER — INSULIN REGULAR(HUMAN) IN NACL 100-0.9 UT/100ML-% IV SOLN
INTRAVENOUS | Status: DC
  Administered 2023-10-03: 11 [IU]/h via INTRAVENOUS
  Filled 2023-10-03: qty 100

## 2023-10-03 MED ORDER — INSULIN ASPART 100 UNIT/ML IJ SOLN
0.0000 [IU] | Freq: Every day | INTRAMUSCULAR | Status: DC
  Administered 2023-10-03: 2 [IU] via SUBCUTANEOUS
  Administered 2023-10-04: 3 [IU] via SUBCUTANEOUS
  Filled 2023-10-03 (×2): qty 1

## 2023-10-03 MED ORDER — TRAMADOL HCL 50 MG PO TABS: 50.0000 mg | ORAL_TABLET | Freq: Four times a day (QID) | ORAL | Status: DC | PRN

## 2023-10-03 MED ORDER — LACTATED RINGERS IV BOLUS: 20.0000 mL/kg | Freq: Once | INTRAVENOUS | Status: DC

## 2023-10-03 MED ORDER — ACETAMINOPHEN 650 MG RE SUPP: 650.0000 mg | Freq: Four times a day (QID) | RECTAL | Status: DC | PRN

## 2023-10-03 MED ORDER — SODIUM CHLORIDE 0.9 % IV BOLUS
1000.0000 mL | Freq: Once | INTRAVENOUS | Status: AC
  Administered 2023-10-03: 1000 mL via INTRAVENOUS

## 2023-10-03 MED ORDER — INSULIN GLARGINE-YFGN 100 UNIT/ML ~~LOC~~ SOLN
15.0000 [IU] | SUBCUTANEOUS | Status: DC
  Administered 2023-10-03 – 2023-10-04 (×2): 15 [IU] via SUBCUTANEOUS
  Filled 2023-10-03 (×2): qty 0.15

## 2023-10-03 MED ORDER — LACTATED RINGERS IV SOLN: INTRAVENOUS | Status: DC

## 2023-10-03 MED ORDER — LEVOTHYROXINE SODIUM 50 MCG PO TABS
175.0000 ug | ORAL_TABLET | Freq: Every day | ORAL | Status: DC
  Administered 2023-10-03 – 2023-10-05 (×3): 175 ug via ORAL
  Filled 2023-10-03 (×2): qty 2
  Filled 2023-10-03: qty 4

## 2023-10-03 MED ORDER — LORAZEPAM 2 MG/ML IJ SOLN: 0.5000 mg | Freq: Once | INTRAMUSCULAR | Status: DC | PRN

## 2023-10-03 MED ORDER — DEXTROSE 50 % IV SOLN: 0.0000 mL | INTRAVENOUS | Status: DC | PRN

## 2023-10-03 MED ORDER — GABAPENTIN 100 MG PO CAPS
100.0000 mg | ORAL_CAPSULE | Freq: Every day | ORAL | Status: DC
  Administered 2023-10-03 – 2023-10-04 (×2): 100 mg via ORAL
  Filled 2023-10-03 (×2): qty 1

## 2023-10-03 MED ORDER — INSULIN REGULAR(HUMAN) IN NACL 100-0.9 UT/100ML-% IV SOLN: INTRAVENOUS | Status: DC

## 2023-10-03 MED ORDER — ENOXAPARIN SODIUM 40 MG/0.4ML IJ SOSY
40.0000 mg | PREFILLED_SYRINGE | INTRAMUSCULAR | Status: DC
  Administered 2023-10-03 – 2023-10-05 (×3): 40 mg via SUBCUTANEOUS
  Filled 2023-10-03 (×3): qty 0.4

## 2023-10-03 MED ORDER — DEXTROSE IN LACTATED RINGERS 5 % IV SOLN
INTRAVENOUS | Status: DC
Start: 2023-10-03 — End: 2023-10-03

## 2023-10-03 MED ORDER — CLOPIDOGREL BISULFATE 75 MG PO TABS
75.0000 mg | ORAL_TABLET | Freq: Every day | ORAL | Status: DC
  Administered 2023-10-04 – 2023-10-05 (×2): 75 mg via ORAL
  Filled 2023-10-03 (×2): qty 1

## 2023-10-03 MED ORDER — LORATADINE 10 MG PO TABS
10.0000 mg | ORAL_TABLET | Freq: Every day | ORAL | Status: DC
  Administered 2023-10-03 – 2023-10-04 (×2): 10 mg via ORAL
  Filled 2023-10-03 (×2): qty 1

## 2023-10-03 MED ORDER — METOPROLOL TARTRATE 50 MG PO TABS
50.0000 mg | ORAL_TABLET | Freq: Two times a day (BID) | ORAL | Status: DC
  Administered 2023-10-03 – 2023-10-05 (×5): 50 mg via ORAL
  Filled 2023-10-03 (×6): qty 1

## 2023-10-03 MED ORDER — VITAMIN D 25 MCG (1000 UNIT) PO TABS
5000.0000 [IU] | ORAL_TABLET | Freq: Every day | ORAL | Status: DC
  Administered 2023-10-03 – 2023-10-05 (×3): 5000 [IU] via ORAL
  Filled 2023-10-03 (×3): qty 5

## 2023-10-03 MED ORDER — INSULIN ASPART 100 UNIT/ML IJ SOLN
0.0000 [IU] | Freq: Three times a day (TID) | INTRAMUSCULAR | Status: DC
  Administered 2023-10-03: 11 [IU] via SUBCUTANEOUS
  Administered 2023-10-03: 3 [IU] via SUBCUTANEOUS
  Administered 2023-10-04: 15 [IU] via SUBCUTANEOUS
  Administered 2023-10-04: 3 [IU] via SUBCUTANEOUS
  Administered 2023-10-05 (×2): 5 [IU] via SUBCUTANEOUS
  Filled 2023-10-03 (×5): qty 1

## 2023-10-03 MED ORDER — ROSUVASTATIN CALCIUM 10 MG PO TABS
20.0000 mg | ORAL_TABLET | Freq: Every day | ORAL | Status: DC
  Administered 2023-10-03 – 2023-10-05 (×3): 20 mg via ORAL
  Filled 2023-10-03: qty 1
  Filled 2023-10-03 (×3): qty 2

## 2023-10-03 MED ORDER — INSULIN GLARGINE-YFGN 100 UNIT/ML ~~LOC~~ SOLN
20.0000 [IU] | SUBCUTANEOUS | Status: DC
Start: 2023-10-03 — End: 2023-10-03
  Filled 2023-10-03: qty 0.2

## 2023-10-03 MED ORDER — POTASSIUM CHLORIDE 10 MEQ/100ML IV SOLN
10.0000 meq | INTRAVENOUS | Status: AC
  Administered 2023-10-03 (×2): 10 meq via INTRAVENOUS
  Filled 2023-10-03 (×2): qty 100

## 2023-10-03 MED ORDER — SODIUM CHLORIDE 0.9 % IV SOLN
1.0000 g | Freq: Every day | INTRAVENOUS | Status: DC
  Administered 2023-10-03: 1 g via INTRAVENOUS
  Filled 2023-10-03 (×2): qty 10

## 2023-10-03 MED ORDER — DEXTROSE IN LACTATED RINGERS 5 % IV SOLN: INTRAVENOUS | Status: DC

## 2023-10-03 MED ORDER — ACETAMINOPHEN 325 MG PO TABS: 650.0000 mg | ORAL_TABLET | Freq: Four times a day (QID) | ORAL | Status: DC | PRN

## 2023-10-03 MED ORDER — POTASSIUM CHLORIDE CRYS ER 20 MEQ PO TBCR
20.0000 meq | EXTENDED_RELEASE_TABLET | Freq: Every day | ORAL | Status: DC
  Administered 2023-10-04 – 2023-10-05 (×2): 20 meq via ORAL
  Filled 2023-10-03 (×2): qty 1

## 2023-10-03 MED ORDER — ASPIRIN 81 MG PO TBEC
81.0000 mg | DELAYED_RELEASE_TABLET | Freq: Every day | ORAL | Status: DC
  Administered 2023-10-03 – 2023-10-05 (×3): 81 mg via ORAL
  Filled 2023-10-03 (×3): qty 1

## 2023-10-03 MED ORDER — TRAZODONE HCL 50 MG PO TABS
25.0000 mg | ORAL_TABLET | Freq: Every evening | ORAL | Status: DC | PRN
  Administered 2023-10-03: 25 mg via ORAL
  Filled 2023-10-03: qty 1

## 2023-10-03 MED ORDER — MAGNESIUM HYDROXIDE 400 MG/5ML PO SUSP: 30.0000 mL | Freq: Every day | ORAL | Status: DC | PRN

## 2023-10-03 MED ORDER — LOSARTAN POTASSIUM 25 MG PO TABS
25.0000 mg | ORAL_TABLET | Freq: Every morning | ORAL | Status: DC
  Administered 2023-10-03 – 2023-10-05 (×3): 25 mg via ORAL
  Filled 2023-10-03 (×3): qty 1

## 2023-10-03 MED ORDER — VITAMIN C 500 MG PO TABS
1000.0000 mg | ORAL_TABLET | Freq: Every evening | ORAL | Status: DC
  Administered 2023-10-03 – 2023-10-04 (×2): 1000 mg via ORAL
  Filled 2023-10-03 (×3): qty 2

## 2023-10-03 NOTE — Assessment & Plan Note (Signed)
-   The patient will be admitted to a stepdown bed. - We will continue the on IV insulin drip per [facility] protocol. - The patient will be aggressively hydrated with IV normal saline. - Will follow serial BMPs.

## 2023-10-03 NOTE — Assessment & Plan Note (Signed)
-   The patient be placed on IV Rocephin. - We will follow blood and urine cultures.

## 2023-10-03 NOTE — ED Notes (Signed)
 Advised nurse that patient has ready bed

## 2023-10-03 NOTE — Progress Notes (Signed)
 Occupational Therapy Evaluation Patient Details Name: Alicia Medina MRN: [id number] DOB: 07-27-1933 Today's Date: 10/03/2023   History of Present Illness   Alicia Medina is a 89 y.o. Caucasian female with medical history significant for type 2 diabetes mellitus, coronary artery disease, osteoarthritis, diverticulosis, hypertension, dyslipidemia and hypothyroidism, who presented to the emergency room with acute onset of altered mental status.     Clinical Impressions  Pt was seen for OT evaluation this date. Prior to hospital admission, pt was living indep, in house with 3-4 steps to enter. Neighbor endorses concerns that the pt's sons don't know how difficult it is for pt due to them living far away. Neighbor states that the status of the pt's home has gotten bad, "unable to walk anywhere, hoarding behaviors, tripping hazards." Families biggest concerns is pt's ability to manage medication and pt has frequently began to get lost when out driving in town. Pt presents to acute OT demonstrating impaired medication management, ADL completion and unsafe functional mobility (See OT problem list for additional functional deficits). Pt currently requires supervision for dressing task, amb to bathroom with CGA, then supervision while furniture walking. Pt educated safety risks for furniture walking and the benefits of DME use. Pt states she dislikes using her cane. Pt retied back in bed to rest per her request with neighbor at bedside. Pt would benefit from skilled OT services to address noted impairments and functional limitations (see below for any additional details) in order to maximize safety and independence while minimizing falls risk and caregiver burden. OT will follow acutely.      If plan is discharge home, recommend the following:   A little help with walking and/or transfers;A little help with bathing/dressing/bathroom;Assistance with cooking/housework;Assist for transportation;Direct  supervision/assist for medications management     Functional Status Assessment   Patient has had a recent decline in their functional status and demonstrates the ability to make significant improvements in function in a reasonable and predictable amount of time.     Equipment Recommendations   Other (comment) (rolling walker)     Recommendations for Other Services         Precautions/Restrictions   Precautions Precautions: Fall Recall of Precautions/Restrictions: Intact     Mobility Bed Mobility Overal bed mobility: Independent                  Transfers Overall transfer level: Modified independent                 General transfer comment: Amb with ease throughout room, CGA initally, digressed to superivision.      Balance Overall balance assessment: Needs assistance Sitting-balance support: No upper extremity supported, Feet supported Sitting balance-Leahy Scale: Good     Standing balance support: During functional activity, Single extremity supported Standing balance-Leahy Scale: Good Standing balance comment: Often furniture walks                           ADL either performed or assessed with clinical judgement   ADL Overall ADL's : Needs assistance/impaired     Grooming: Wash/dry hands;Standing;Contact guard assist               Lower Body Dressing: Supervision/safety;Sit to/from stand   Toilet Transfer: Occupational hygienist and Hygiene: Supervision/safety (Cueing for wiping front to back)       Functional mobility during ADLs: Contact guard assist General ADL Comments: Completed UB/LB dressing  while sit/stand from EOB with supervision, min cues for sequencing.     Pertinent Vitals/Pain Pain Assessment Pain Assessment: No/denies pain     Extremity/Trunk Assessment Upper Extremity Assessment Upper Extremity Assessment: Overall WFL for tasks  assessed   Lower Extremity Assessment Lower Extremity Assessment: Overall WFL for tasks assessed       Communication Communication Communication: No apparent difficulties   Cognition Arousal: Alert Behavior During Therapy: WFL for tasks assessed/performed Cognition: Cognition impaired   Orientation impairments: Situation Awareness: Intellectual awareness intact     Executive functioning impairment (select all impairments): Problem solving OT - Cognition Comments: Pt requires verbal cues for hygiene tasks such as noticing she needs to complete pericare before placing clean clothes on and washing hands after using the bathroom.                 Following commands: Intact       Cueing  General Comments   Cueing Techniques: Verbal cues;Visual cues   Neighbor endorses concerns that the pt's sons don't know how difficult it is for pt due to them living far away. Neighbor states that the status of the pt's home has gotten bad, "unable to walk anywhere, hoarding behaviors, tripping hazards."    Exercises Exercises: Other exercises Other Exercises Other Exercises: Edu: role of OT, fall prevention, safe ADL completion, amb techniques such as use of DME instead of furniture walking   Shoulder Instructions      Home Living Family/patient expects to be discharged to:: Private residence Living Arrangements: Alone Available Help at Discharge: Available PRN/intermittently;Neighbor;Friend(s) Type of Home: House Home Access: Stairs to enter [employer] of Steps: 3-4 Entrance Stairs-Rails: Right Home Layout: One level     Bathroom Shower/Tub: Producer, television/film/video: Handicapped height     Home Equipment: Production assistant, radio - single point (Grab bars in gararge)   Additional Comments: 196ft + drive way to mainbox walks daily      Prior Functioning/Environment Prior Level of Function : Driving;History of Falls (last six months) (Fall in bedroom, shown  family bruises, pt not able to recall fall.)             Mobility Comments: Utilizes SPC on occasion, often leaves it in car. Reports uses cart when at [employer] for support ADLs Comments: Pt completed ADLs MODI, reports often doesnt make it to bathroom, wears depends daily. Pt family reports pt has recently been getting lost when driving around in town, and overall decline in the pt's ability to take care of herself.    OT Problem List: Decreased activity tolerance;Decreased strength;Decreased safety awareness;Decreased knowledge of use of DME or AE   OT Treatment/Interventions: Self-care/ADL training;DME and/or AE instruction;Therapeutic activities;Patient/family education      OT Goals(Current goals can be found in the care plan section)   Acute Rehab OT Goals Patient Stated Goal: to return home OT Goal Formulation: With patient Time For Goal Achievement: 10/17/23 Potential to Achieve Goals: Good ADL Goals Pt Will Perform Grooming: with modified independence;standing Pt Will Perform Toileting - Clothing Manipulation and hygiene: with modified independence;sit to/from stand Additional ADL Goal #1: Pt will complete pill box assessment with min verbal cues in 2/3 opportunities.   OT Frequency:  Min 1X/week    Co-evaluation              AM-PAC OT "6 Clicks" Daily Activity     Outcome Measure Help from another person eating meals?: None Help from another person taking care of personal  grooming?: A Little Help from another person toileting, which includes using toliet, bedpan, or urinal?: A Little Help from another person bathing (including washing, rinsing, drying)?: A Little Help from another person to put on and taking off regular upper body clothing?: None Help from another person to put on and taking off regular lower body clothing?: None 6 Click Score: 21   End of Session Equipment Utilized During Treatment: Gait belt Nurse Communication: Mobility status  Activity  Tolerance: Patient tolerated treatment well Patient left: in bed;with call bell/phone within reach;with bed alarm set;with family/visitor present  OT Visit Diagnosis: Unsteadiness on feet (R26.81);Muscle weakness (generalized) (M62.81);History of falling (Z91.81)                Time: 0454-0981 OT Time Calculation (min): 59 min Charges:  OT General Charges $OT Visit: 1 Visit OT Evaluation $OT Eval Low Complexity: 1 Low OT Treatments $Therapeutic Activity: 23-37 mins  Glenard Haring M.S. OTR/L  10/03/23, 2:02 PM

## 2023-10-03 NOTE — H&P (Signed)
 [place]   PATIENT NAME: Alicia Medina    MR#:  [id number]  DATE OF BIRTH:  09-Jun-1933  DATE OF ADMISSION:  10/03/2023  PRIMARY CARE PHYSICIAN: Pcp, No   Patient is coming from: Home  REQUESTING/REFERRING PHYSICIAN: Pilar Jarvis, MD  CHIEF COMPLAINT:   Chief Complaint  Patient presents with   Altered Mental Status    HISTORY OF PRESENT ILLNESS:  Alicia Medina is a 89 y.o. Caucasian female with medical history significant for type 2 diabetes mellitus, coronary artery disease, osteoarthritis, diverticulosis, hypertension, dyslipidemia and hypothyroidism, who presented to the emergency room with acute onset of altered mental status.  The patient was seen to be confused and agitated to her son when he was talking to her over the phone from [place].  He had a neighbor checked on her and she appeared to be altered and combative and therefore EMS was called.  Her neighbor stated that she has been on a slow decline in her cognition since the wintertime.  They reported she has not been taking her insulin.  In the ER she was cursing at her diabetes condition stating that she does not need insulin.  She was able to recognize her neighbor at bedside.  No reported fever or chills.  No reported nausea or vomiting or abdominal pain.  Is been having urinary frequency over the last couple weeks without dysuria or hematuria or flank pain.  No cough or wheezing or hemoptysis.  No chest pain or palpitations.  No paresthesias or focal muscle weakness.  No headache or dizziness or blurred vision.  ED Course: When she came to the ER, BP was 143/76, temperature 99.3 with otherwise normal vital signs.  Labs revealed blood gas with pH 7.44 and HCO3 of 26.5.  CMP was remarkable for blood glucose of 618, chloride 95 and sodium 127 with calcium of 8.7 and BUN 42.  CBC showed mild anemia.  Coag profile was normal.  Beta-hydroxybutyrate was 0.25.  Respiratory panel came back negative.  UA was positive for  UTI. EKG as reviewed by me : Sinus tachycardia with rate 107 with low voltage QRS. Imaging: Portable chest x-ray showed no acute cardiopulmonary disease.  Noncontrast head CT scan showed mild diffuse atrophy and chronic small vessel ischemic change with no acute ventricular abnormalities.  The patient was placed on Endo tool with [facility] protocol.  Will start her on IV Rocephin for UTI.  She will be admitted to stepdown unit bed for further evaluation and management PAST MEDICAL HISTORY:   Past Medical History:  Diagnosis Date   Arthritis    CAD (coronary artery disease) CARDIOLOGIST-  DR [place]   08-28-2011 Inferior STEMI with BMS to the RCA, complicated by cardiogenic shock, VF, VDRF , temporary CHB and abrupt reocclusion of the RCA stent treated with a second BMS to the RCA. Has residual LAD disease. EF is 50%   Diverticulosis of colon    Hemorrhoids, internal, with bleeding    Hyperlipidemia    Hypertension    Hypothyroidism, postsurgical    S/p bare metal coronary artery stent    2013   Type 2 diabetes mellitus (HCC)    Wears hearing aid    both ears    PAST SURGICAL HISTORY:   Past Surgical History:  Procedure Laterality Date   CARDIOVASCULAR STRESS TEST  10-03-2011  dr [place]   normal perfusion / no ischemia/ ef 77%   CARPAL TUNNEL RELEASE Right 01/25/2013   Procedure: CARPAL TUNNEL  RELEASE;  Surgeon: Nicki Reaper, MD;  Location: [facility];  Service: Orthopedics;  Laterality: Right;   CARPAL TUNNEL RELEASE Left 10/25/2013   Procedure: LEFT CARPAL TUNNEL RELEASE;  Surgeon: Nicki Reaper, MD;  Location: [facility];  Service: Orthopedics;  Laterality: Left;   CATARACT EXTRACTION W/ INTRAOCULAR LENS  IMPLANT, BILATERAL  2007   COLONOSCOPY  04-27-2003   CORONARY ANGIOGRAM Left 08/28/2011   Procedure: CORONARY ANGIOGRAM;  Surgeon: Peter M [place], MD;  Location: [facility] CATH LAB;  Service: Cardiovascular;  Laterality: Left;   CORONARY ANGIOPLASTY WITH STENT  PLACEMENT  08-28-2011  dr [place]   BMS to [facility] with abrupt reocclusion treated with second BMS/  LAD ostial 90%, proximal50-60%, first diagonal 80-90%   CORONARY STENT INTERVENTION N/A 10/11/2018   Procedure: CORONARY STENT INTERVENTION;  Surgeon: [place], Peter M, MD;  Location: [facility] INVASIVE CV LAB;  Service: Cardiovascular;  Laterality: N/A;   LEFT HEART CATH AND CORONARY ANGIOGRAPHY N/A 10/11/2018   Procedure: LEFT HEART CATH AND CORONARY ANGIOGRAPHY;  Surgeon: [place], Peter M, MD;  Location: [facility] INVASIVE CV LAB;  Service: Cardiovascular;  Laterality: N/A;   LEFT HEART CATHETERIZATION WITH CORONARY ANGIOGRAM N/A 08/28/2011   Procedure: LEFT HEART CATHETERIZATION WITH CORONARY ANGIOGRAM;  Surgeon: Peter M [place], MD;  Location: [facility] CATH LAB;  Service: Cardiovascular;  Laterality: N/A;   MASS EXCISION Right 08/06/2020   Procedure: EXCISION MASS AND DEBRIDEMENT PROXIMAL INTERPHALANGEAL JOINT RIGHT SMALL FINGER;  Surgeon: Cindee Salt, MD;  Location: [facility];  Service: Orthopedics;  Laterality: Right;  Bier block   PERCUTANEOUS CORONARY STENT INTERVENTION (PCI-S) Left 08/28/2011   Procedure: PERCUTANEOUS CORONARY STENT INTERVENTION (PCI-S);  Surgeon: Peter M [place], MD;  Location: [facility] CATH LAB;  Service: Cardiovascular;  Laterality: Left;   TOTAL THYROIDECTOMY Bilateral 05-02-2007   multinodular goiter   TRANSANAL HEMORRHOIDAL DEARTERIALIZATION N/A 03/28/2014   Procedure: TRANSANAL HEMORRHOIDAL DEARTERIALIZATION;  Surgeon: Romie Levee, MD;  Location[facility];  Service: General;  Laterality: N/A;   TRANSTHORACIC ECHOCARDIOGRAM  08-30-2011   mild LVH/ ef 50-55%/  grade I diastolic dysfunction   TRIGGER FINGER RELEASE Right 01/25/2013   Procedure: RELEASE TRIGGER FINGER/A-1 PULLEY RIGHT INDEX FINGER;  Surgeon: Nicki Reaper, MD;  Location: [facility];  Service: Orthopedics;  Laterality: Right;   TRIGGER FINGER RELEASE Left 10/25/2013   Procedure: RELEASE A-1 PULLEY  LEFT MIDDLE FINGER;  Surgeon: Nicki Reaper, MD;  Location: [facility];  Service: Orthopedics;  Laterality: Left;   TRIGGER FINGER RELEASE Left 12/04/2014   Procedure: RELEASE TRIGGER FINGER/A-1 PULLEY LEFT RING FINGER;  Surgeon: Cindee Salt, MD;  Location: [facility];  Service: Orthopedics;  Laterality: Left;   TRIGGER FINGER RELEASE Right 03/30/2017   Procedure: RELEASE TRIGGER FINGER/A-1 PULLEY RIGHT RING FINGER AND RIGHT SMALL FINGER;  Surgeon: Cindee Salt, MD;  Location: [facility];  Service: Orthopedics;  Laterality: Right;    SOCIAL HISTORY:   Social History   Tobacco Use   Smoking status: Never   Smokeless tobacco: Never  Substance Use Topics   Alcohol use: Yes    Alcohol/week: 1.0 standard drink of alcohol    Types: 1 Glasses of wine per week    Comment: social    FAMILY HISTORY:   Family History  Problem Relation Age of Onset   Stroke Mother    Cancer Mother        breast age 45's   Cancer Father  lung   Stroke Father    Diabetes Neg Hx     DRUG ALLERGIES:   Allergies  Allergen Reactions   Demeclocycline Other (See Comments)    Severe stomach cramps Severe stomach cramps Severe stomach cramps   Lisinopril Cough   Tetracyclines & Related Other (See Comments)    Severe stomach cramps    REVIEW OF SYSTEMS:   ROS As per history of present illness. All pertinent systems were reviewed above. Constitutional, HEENT, cardiovascular, respiratory, GI, GU, musculoskeletal, neuro, psychiatric, endocrine, integumentary and hematologic systems were reviewed and are otherwise negative/unremarkable except for positive findings mentioned above in the HPI.   MEDICATIONS AT HOME:   Prior to Admission medications   Medication Sig Start Date End Date Taking? Authorizing Provider  acetaminophen (TYLENOL) 500 MG tablet Take 1,000 mg by mouth at bedtime.    [provider]  Ascorbic Acid (VITAMIN C) 1000 MG tablet Take  1,000 mg by mouth every evening.    [provider]  aspirin EC 81 MG tablet Take 81 mg by mouth daily.    [provider]  calcium carbonate (SUPER CALCIUM) 1500 (600 Ca) MG TABS tablet Take by mouth. 10/28/16   [provider]  Cholecalciferol (VITAMIN D-3) 125 MCG (5000 UT) TABS Take 5,000 Units by mouth daily.    [provider]  clopidogrel (PLAVIX) 75 MG tablet  07/29/20   [provider]  Cyanocobalamin 2500 MCG TABS Take by mouth. 10/28/16   [provider]  Dulaglutide (TRULICITY) 3 MG/0.5ML SOPN Inject 3 mg as directed once a week. 05/03/23   Thapa, [place], MD  gabapentin (NEURONTIN) 100 MG capsule Take 1 capsule (100 mg total) by mouth at bedtime. 11/16/22   Vivi Barrack, DPM  gabapentin (NEURONTIN) 100 MG capsule Take 1 capsule (100 mg total) by mouth 2 (two) times daily. 03/23/23   Vivi Barrack, DPM  glucose blood (TRUE METRIX BLOOD GLUCOSE TEST) test strip 1 each by Other route 2 (two) times daily. And lancets 2/day 06/20/18   Romero Belling, MD  loperamide (IMODIUM A-D) 2 MG tablet Take 1-2 mg by mouth as directed.    [provider]  loratadine (CLARITIN) 10 MG tablet Take 10 mg by mouth at bedtime.    [provider]  losartan (COZAAR) 25 MG tablet Take 1 tablet (25 mg total) by mouth every morning. 11/05/22   [place], Peter M, MD  metoprolol tartrate (LOPRESSOR) 50 MG tablet Take 1 tablet (50 mg total) by mouth 2 (two) times daily. 11/05/22   [place], Peter M, MD  potassium chloride SA (KLOR-CON M) 20 MEQ tablet Take by mouth. 04/11/15   [provider]  rosuvastatin (CRESTOR) 20 MG tablet Take 1 tablet (20 mg total) by mouth daily. 11/05/22   [place], Peter M, MD  S-Adenosylmethionine 400 MG TABS Take by mouth. 10/28/16   [provider]  SYNTHROID 175 MCG tablet TAKE 1 TABLET (175 MCG TOTAL) BY MOUTH DAILY BEFORE BREAKFAST. 06/16/23   [place], Peter M, MD  traMADol (ULTRAM) 50 MG tablet Take by  mouth. 08/06/20   [provider]  Vitamin E 500 UNIT/GM POWD Take by mouth. 10/28/16   [provider]      VITAL SIGNS:  Blood pressure (!) 143/76, pulse (!) 103, temperature 99.3 F (37.4 C), resp. rate 16, SpO2 98%.  PHYSICAL EXAMINATION:  Physical Exam  GENERAL: Acutely ill 89 y.o.-year-old Caucasian female patient lying in the bed with no acute distress.  EYES:  Pupils equal, round, reactive to light and accommodation. No scleral icterus. Extraocular muscles intact.  HEENT: Head atraumatic, normocephalic. Oropharynx and nasopharynx clear.  NECK:  Supple, no jugular venous distention. No thyroid enlargement, no tenderness.  LUNGS: Normal breath sounds bilaterally, no wheezing, rales,rhonchi or crepitation. No use of accessory muscles of respiration.  CARDIOVASCULAR: Regular rate and rhythm, S1, S2 normal. No murmurs, rubs, or gallops.  ABDOMEN: Soft, nondistended, nontender. Bowel sounds present. No organomegaly or mass.  EXTREMITIES: No pedal edema, cyanosis, or clubbing.  NEUROLOGIC: Cranial nerves II through XII are intact. Muscle strength 5/5 in all extremities. Sensation intact. Gait not checked.  PSYCHIATRIC: The patient is alert and oriented x 3.  Normal affect and good eye contact. SKIN: No obvious rash, lesion, or ulcer.   LABORATORY PANEL:   CBC Recent Labs  Lab 10/03/23 0042  WBC 7.2  HGB 11.6*  HCT 35.2*  PLT 313   ------------------------------------------------------------------------------------------------------------------  Chemistries  Recent Labs  Lab 10/03/23 0042  NA 127*  K 4.2  CL 95*  CO2 24  GLUCOSE 618*  BUN 42*  CREATININE 0.98  CALCIUM 8.7*  AST 15  ALT 15  ALKPHOS 71  BILITOT 0.4   ------------------------------------------------------------------------------------------------------------------  Cardiac Enzymes No results for input(s): "TROPONINI" in the last 168  hours. ------------------------------------------------------------------------------------------------------------------  RADIOLOGY:  CT Head Wo Contrast Result Date: 10/03/2023 CLINICAL DATA:  Mental status change EXAM: CT HEAD WITHOUT CONTRAST TECHNIQUE: Contiguous axial images were obtained from the base of the skull through the vertex without intravenous contrast. RADIATION DOSE REDUCTION: This exam was performed according to the departmental dose-optimization program which includes automated exposure control, adjustment of the mA and/or kV according to patient size and/or use of iterative reconstruction technique. COMPARISON:  None Available. FINDINGS: Brain: No evidence of acute infarction, hemorrhage, hydrocephalus, extra-axial collection or mass lesion/mass effect. There is mild diffuse atrophy. There is mild periventricular white matter hypodensity, likely chronic small vessel ischemic change. Vascular: Atherosclerotic calcifications are present within the cavernous internal carotid arteries. Skull: Normal. Negative for fracture or focal lesion. Sinuses/Orbits: No acute finding. Other: None. IMPRESSION: 1. No acute intracranial process. 2. Mild diffuse atrophy and chronic small vessel ischemic change. Electronically Signed   By: Darliss Cheney M.D.   On: 10/03/2023 03:11   DG Chest Port 1 View Result Date: 10/03/2023 CLINICAL DATA:  Sepsis EXAM: PORTABLE CHEST 1 VIEW COMPARISON:  Chest x-ray 09/02/2011 FINDINGS: The heart size and mediastinal contours are within normal limits. Both lungs are clear. The visualized skeletal structures are unremarkable. IMPRESSION: No active disease. Electronically Signed   By: Darliss Cheney M.D.   On: 10/03/2023 01:05      IMPRESSION AND PLAN:  Assessment and Plan: * Uncontrolled type 2 diabetes mellitus with hyperosmolar nonketotic hyperglycemia (HCC) - The patient will be admitted to a stepdown bed. - We will continue the on IV insulin drip per EndoTool [facility]  protocol. - The patient will be aggressively hydrated with IV normal saline. - Will follow serial BMPs.   Acute lower UTI - The patient be placed on IV Rocephin. - We will follow blood and urine cultures.  Hypothyroidism - We will continue Synthroid.  Dyslipidemia - Continue statin therapy.  Diabetic neuropathy (HCC) - We will continue Neurontin.  Essential hypertension - We will continue antihypertensive therapy.   DVT prophylaxis: Lovenox.  Advanced Care Planning:  Code Status: full code.  Family Communication:  The plan of care was discussed in details with the patient (and family). I answered all  questions. The patient agreed to proceed with the above mentioned plan. Further management will depend upon hospital course. Disposition Plan: Back to previous home environment Consults called: none.  All the records are reviewed and case discussed with ED provider.  Status is: Inpatient    At the time of the admission, it appears that the appropriate admission status for this patient is inpatient.  This is judged to be reasonable and necessary in order to provide the required intensity of service to ensure the patient's safety given the presenting symptoms, physical exam findings and initial radiographic and laboratory data in the context of comorbid conditions.  The patient requires inpatient status due to high intensity of service, high risk of further deterioration and high frequency of surveillance required.  I certify that at the time of admission, it is my clinical judgment that the patient will require inpatient hospital care extending more than 2 midnights.                            Dispo: The patient is from: Home              Anticipated d/c is to: Home              Patient currently is not medically stable to d/c.              Difficult to place patient: No  Authorized and performed by: Valente David, MD Total critical care time:   50     minutes. Due to a high  probability of clinically significant, life-threatening deterioration, the patient required my highest level of preparedness to intervene emergently and I personally spent this critical care time directly and personally managing the patient.  This critical care time included obtaining a history, examining the patient, pulse oximetry, ordering and review of studies, arranging urgent treatment with development of management plan, evaluation of patient's response to treatment, frequent reassessment, and discussions with other providers. This critical care time was performed to assess and manage the high probability of imminent, life-threatening deterioration that could result in multiorgan failure.  It was exclusive of separately billable procedures and treating other patients and teaching time.   Hannah Beat M.D on 10/03/2023 at 3:44 AM  [facility]   From 7 PM-7 AM, contact night-coverage [email or web address]  CC: Primary care physician; Pcp, No

## 2023-10-03 NOTE — Progress Notes (Signed)
 Brief progress note.  This is a nonbillable note.  Please see same-day H&P for full billable details.  Briefly, this is an 89 year old female who presents for hyperglycemic hyperosmolar state.  No evidence of DKA.  Likely in the setting of urinary tract infection.  Patient has a document history of nonadherence to medications.  Refuses to take insulin.  Sugars on arrival greater than 600.  Started on insulin gtt. via Endo tool protocol.  Sugars have improved.  Plan: Will wean off insulin gtt.  Start subcutaneous regimen.  Diabetes coordinator consult.  Carb modified diet.  No IV fluids if tolerating p.o.  Continue antibiotics for UTI.  Possible discharge in 24 hours.  Lolita Patella MD  No charge

## 2023-10-03 NOTE — Assessment & Plan Note (Signed)
 Continue statin therapy.

## 2023-10-03 NOTE — Assessment & Plan Note (Signed)
 -  We will continue Synthroid.

## 2023-10-03 NOTE — ED Provider Notes (Signed)
 [facility] Provider Note    Event Date/Time   First MD Initiated Contact with Patient 10/03/23 0028     (approximate)   History   Altered Mental Status   HPI  Alicia Medina is a 89 y.o. female   Past medical history of insulin-dependent type 2 diabetic, hypertension, hyperlipidemia, hypothyroid who is here with altered mental status.  Her son from [place] was on the phone with her she seemed to be more confused and agitated than normal.  He had a neighbor check on her and she did appear more altered and combative thus prompting EMS to bring her to the emergency department.  Of note the neighbor is here who knows her well and spoke to the son earlier, who notes that she has been on a slow decline in her cognition/mental capacities since the wintertime but this is worse than normal.  They report that she has not been taking her insulin.  The patient is awake, answering questions, and does say some inappropriate things at times like cursing at her diabetes condition, stating that she does not need insulin.  She does recognize her neighbor at bedside and denies any other acute medical complaints or illnesses or trauma  Independent Historian contributed to assessment above: Neighbor at bedside corroborates information past medical history as above    Physical Exam   Triage Vital Signs: ED Triage Vitals  Encounter Vitals Group     BP 10/03/23 0019 (!) 143/76     Systolic BP Percentile --      Diastolic BP Percentile --      Pulse Rate 10/03/23 0019 100     Resp 10/03/23 0019 16     Temp 10/03/23 0019 99.3 F (37.4 C)     Temp src --      SpO2 10/03/23 0019 95 %     Weight --      Height --      Head Circumference --      Peak Flow --      Pain Score 10/03/23 0022 0     Pain Loc --      Pain Education --      Exclude from Growth Chart --     Most recent vital signs: Vitals:   10/03/23 0019 10/03/23 0022  BP: (!) 143/76   Pulse: 100 (!) 103   Resp: 16   Temp: 99.3 F (37.4 C)   SpO2: 95% 98%    General: Awake, no distress.  CV:  Good peripheral perfusion.  Resp:  Normal effort.  Abd:  No distention.  Other:  Awake alert, mostly cooperative following commands moves all extremities no facial asymmetry or dysarthria noted.  Soft and on abdominal exam and clear lungs to auscultation bilaterally.  Appears slightly dehydrated with dry mucous membranes poor skin turgor.  Her neck is supple with full range of motion there is no obvious head trauma.   ED Results / Procedures / Treatments   Labs (all labs ordered are listed, but only abnormal results are displayed) Labs Reviewed  COMPREHENSIVE METABOLIC PANEL - Abnormal; Notable for the following components:      Result Value   Sodium 127 (*)    Chloride 95 (*)    Glucose, Bld 618 (*)    BUN 42 (*)    Calcium 8.7 (*)    GFR, Estimated 56 (*)    All other components within normal limits  CBC WITH DIFFERENTIAL/PLATELET - Abnormal; Notable for the  following components:   Hemoglobin 11.6 (*)    HCT 35.2 (*)    Monocytes Absolute 1.3 (*)    All other components within normal limits  BLOOD GAS, VENOUS - Abnormal; Notable for the following components:   pH, Ven 7.44 (*)    pCO2, Ven 39 (*)    pO2, Ven 58 (*)    Acid-Base Excess 2.3 (*)    All other components within normal limits  CBG MONITORING, ED - Abnormal; Notable for the following components:   Glucose-Capillary >600 (*)    All other components within normal limits  RESP PANEL BY RT-PCR (RSV, FLU A&B, COVID)  [id number]  CULTURE, BLOOD (ROUTINE X 2)  CULTURE, BLOOD (ROUTINE X 2)  LACTIC ACID, PLASMA  PROTIME-INR  APTT  BETA-HYDROXYBUTYRIC ACID  LACTIC ACID, PLASMA  URINALYSIS, W/ REFLEX TO CULTURE (INFECTION SUSPECTED)  TSH  T4, FREE  CBG MONITORING, ED  TROPONIN I (HIGH SENSITIVITY)     I ordered and reviewed the above labs they are notable for upper glycemia to 600s+ but normal anion gap  EKG  ED ECG  REPORT I, Pilar Jarvis, the attending physician, personally viewed and interpreted this ECG.   Date: 10/03/2023  EKG Time: 0038  Rate: 107  Rhythm: sinus tachycardia  Axis: nl  Intervals:none  ST&T Change: no stemi    RADIOLOGY I independently reviewed and interpreted chest x-ray I see no obvious focality pneumothorax I also reviewed radiologist's formal read.   PROCEDURES:  Critical Care performed: Yes, see critical care procedure note(s)  .Critical Care  Performed by: Pilar Jarvis, MD Authorized by: Pilar Jarvis, MD   Critical care provider statement:    Critical care time (minutes):  30   Critical care was time spent personally by me on the following activities:  Development of treatment plan with patient or surrogate, discussions with consultants, evaluation of patient's response to treatment, examination of patient, ordering and review of laboratory studies, ordering and review of radiographic studies, ordering and performing treatments and interventions, pulse oximetry, re-evaluation of patient's condition and review of old charts    MEDICATIONS ORDERED IN ED: Medications  sodium chloride 0.9 % bolus 1,000 mL (has no administration in time range)  LORazepam (ATIVAN) injection 0.5 mg (has no administration in time range)  insulin regular, human (MYXREDLIN) 100 units/ 100 mL infusion (has no administration in time range)  lactated ringers infusion (has no administration in time range)  dextrose 5 % in lactated ringers infusion (has no administration in time range)  dextrose 50 % solution 0-50 mL (has no administration in time range)    External physician / consultants:  I spoke with hospital medicine for admission and regarding care plan for this patient.   IMPRESSION / MDM / ASSESSMENT AND PLAN / ED COURSE  I reviewed the triage vital signs and the nursing notes.                                Patient's presentation is most consistent with acute presentation with  potential threat to life or bodily function.  Differential diagnosis includes, but is not limited to, hyperglycemic crisis, HHS or DKA, infection, progressive dementia   The patient is on the cardiac monitor to evaluate for evidence of arrhythmia and/or significant heart rate changes.  MDM:    Acute on chronic cognition/agitation declines in this diabetic who has not been taking her insulin.   Her blood sugars over  600 but she is not in DKA.  She is combative agitated and this is worse than her usual which has been on a steady decline over the last several months, she is not comatose, following commands, no neurologic deficits so I doubt CVA or HHS.  I will start her on Endo tool insulin in addition to her IV fluid boluses for hyperglycemic crisis.  She has no focal infectious complaints though I think it prudent to check with chest x-ray, viral testing and urinalysis.  Given her altered mental status but no reports of trauma I doubt intracranial trauma but will check with CT head.  Given her altered mental status and hyperglycemic crisis will be admitted.        FINAL CLINICAL IMPRESSION(S) / ED DIAGNOSES   Final diagnoses:  Altered mental status, unspecified altered mental status type  Dehydration  Hyperglycemic crisis in diabetes mellitus (HCC)     Rx / DC Orders   ED Discharge Orders     None        Note:  This document was prepared using Dragon voice recognition software and may include unintentional dictation errors.    Pilar Jarvis, MD 10/03/23 [phone]

## 2023-10-03 NOTE — ED Triage Notes (Signed)
 Pt to ED by EMS from home with c/o AMS. Pt was reported by neighbor as having a decline in health since December and has not been taking her insulin. Pt became combative with EMS and is resistant to measures. Per EMS the neighbor also appears to be becoming increasingly agitated. Arrives oriented to person only, does not answer other questions regarding orientation correctly. VSS, denies any pain or other medical complaints at this time.

## 2023-10-03 NOTE — Assessment & Plan Note (Signed)
-   We will continue antihypertensive therapy.

## 2023-10-03 NOTE — Assessment & Plan Note (Signed)
 -  We will continue Neurontin. ?

## 2023-10-04 DIAGNOSIS — E1100 Type 2 diabetes mellitus with hyperosmolarity without nonketotic hyperglycemic-hyperosmolar coma (NKHHC): Secondary | ICD-10-CM | POA: Diagnosis not present

## 2023-10-04 LAB — GLUCOSE, CAPILLARY
Glucose-Capillary: 193 mg/dL — ABNORMAL HIGH (ref 70–99)
Glucose-Capillary: 410 mg/dL — ABNORMAL HIGH (ref 70–99)
Glucose-Capillary: 429 mg/dL — ABNORMAL HIGH (ref 70–99)
Glucose-Capillary: 96 mg/dL (ref 70–99)
Glucose-Capillary: 282 mg/dL — ABNORMAL HIGH (ref 70–99)

## 2023-10-04 LAB — BASIC METABOLIC PANEL
Anion gap: 8 (ref 5–15)
BUN: 24 mg/dL — ABNORMAL HIGH (ref 8–23)
CO2: 24 mmol/L (ref 22–32)
Calcium: 8.5 mg/dL — ABNORMAL LOW (ref 8.9–10.3)
Chloride: 104 mmol/L (ref 98–111)
Creatinine, Ser: 0.79 mg/dL (ref 0.44–1.00)
GFR, Estimated: >60 mL/min (ref >60)
Glucose, Bld: 180 mg/dL — ABNORMAL HIGH (ref 70–99)
Potassium: 3.7 mmol/L (ref 3.5–5.1)
Sodium: 136 mmol/L (ref 135–145)

## 2023-10-04 MED ORDER — CEPHALEXIN 500 MG PO CAPS
500.0000 mg | ORAL_CAPSULE | Freq: Two times a day (BID) | ORAL | Status: DC
  Administered 2023-10-04 – 2023-10-05 (×3): 500 mg via ORAL
  Filled 2023-10-04 (×3): qty 1

## 2023-10-04 MED ORDER — INSULIN GLARGINE-YFGN 100 UNIT/ML ~~LOC~~ SOLN
20.0000 [IU] | SUBCUTANEOUS | Status: DC
  Administered 2023-10-05: 20 [IU] via SUBCUTANEOUS
  Filled 2023-10-04: qty 0.2

## 2023-10-04 MED ORDER — INSULIN GLARGINE-YFGN 100 UNIT/ML ~~LOC~~ SOLN
5.0000 [IU] | Freq: Once | SUBCUTANEOUS | Status: AC
  Administered 2023-10-04: 5 [IU] via SUBCUTANEOUS
  Filled 2023-10-04: qty 0.05

## 2023-10-04 NOTE — Progress Notes (Signed)
 PROGRESS NOTE    Alicia Medina  WUJ:[id number] DOB: [birth year] DOA: 10/03/2023 PCP: Pcp, No    Brief Narrative:  89 year old female who presents for hyperglycemic hyperosmolar state. No evidence of DKA. Likely in the setting of urinary tract infection. Patient has a document history of nonadherence to medications. Refuses to take insulin. Sugars on arrival greater than 600. Started on insulin gtt. via Endo tool protocol. Sugars have improved.   Glycemic control improved.  On oral treatment for urinary tract infection.  Likely no acute medical issues however discharge deferred at this time due to lack of safe disposition plan.  TOC in communication with family as home environment may not be safe to return to.   Assessment & Plan:   Principal Problem:   Uncontrolled type 2 diabetes mellitus with hyperosmolar nonketotic hyperglycemia (HCC) Active Problems:   Acute lower UTI   Hypothyroidism   Dyslipidemia   Essential hypertension   Diabetic neuropathy (HCC)   Hyperosmolar hyperglycemic state (HHS) (HCC)  Hyperosmolar hyperglycemic state Likely secondary to dietary and medication nonadherence.  Initially required insulin via Endo tool protocol.  This has been weaned off and patient is relatively stable subcutaneous regimen.  CBGs before meals and at bedtime.  Titrate insulin regimen as needed.  Acute UTI Patient has lost IV access.  Will de-escalate to p.o. Keflex.  Complete 3-day total antibiotic course.  Hypothyroidism Synthroid  Hyperlipidemia Statin  Diabetic neuropathy Gabapentin  Essential hypertension BP controlled  Suspected cognitive decline Potentially unsafe living situation Family at bedside is concerned that return to home may not be a safe disposition.  TOC to follow-up.  Discharge delayed by lack of safe disposition plan   DVT prophylaxis: SQ lovenox Code Status: Full Family Communication: Multiple children at bedside 3/10 Disposition Plan: Status is:  Inpatient Remains inpatient appropriate because: Unsafe discharge plan.  TOC to follow-up to determine safe disposition for patient.   Level of care: Med-Surg  Consultants:  None  Procedures:  None  Antimicrobials: Keflex   Subjective: Seen and examined.  Sitting up on edge of bed.  No visible distress.  3 children at bedside.  Objective: Vitals:   10/03/23 0948 10/03/23 1806 10/03/23 2040 10/04/23 0743  BP: 138/77 (!) 115/55 127/70 (!) 151/77  Pulse: (!) 103 82 88 91  Resp: 18 20 20 16   Temp: 98.7 F (37.1 C) 98.6 F (37 C) 98.3 F (36.8 C) 97.6 F (36.4 C)  TempSrc:  Oral Oral Oral  SpO2: 97% 95% 97% 95%  Weight:        Intake/Output Summary (Last 24 hours) at 10/04/2023 1540 Last data filed at 10/04/2023 0900 Gross per 24 hour  Intake 340 ml  Output --  Net 340 ml   Filed Weights   10/03/23 0409  Weight: 64 kg    Examination:  General exam: Appears calm and comfortable  Respiratory system: Clear to auscultation. Respiratory effort normal. Cardiovascular system: S1-S2, RRR, no murmurs, no pedal edema Gastrointestinal system: Soft, NT/ND, normal bowel sounds Central nervous system: Alert and oriented. No focal neurological deficits. Extremities: Symmetric 5 x 5 power. Skin: No rashes, lesions or ulcers Psychiatry: Judgement and insight appear normal. Mood & affect appropriate.     Data Reviewed: I have personally reviewed following labs and imaging studies  CBC: Recent Labs  Lab 10/03/23 0042 10/03/23 0606  WBC 7.2 7.3  NEUTROABS 4.0  --   HGB 11.6* 10.4*  HCT 35.2* 30.7*  MCV 87.1 84.1  PLT 313 288  Basic Metabolic Panel: Recent Labs  Lab 10/03/23 0042 10/03/23 0605 10/04/23 0410  NA 127* 135 136  K 4.2 3.7 3.7  CL 95* 104 104  CO2 24 23 24   GLUCOSE 618* 200* 180*  BUN 42* 32* 24*  CREATININE 0.98 0.77 0.79  CALCIUM 8.7* 8.3* 8.5*   GFR: Estimated Creatinine Clearance: 43.7 mL/min (by C-G formula based on SCr of 0.79  mg/dL). Liver Function Tests: Recent Labs  Lab 10/03/23 0042  AST 15  ALT 15  ALKPHOS 71  BILITOT 0.4  PROT 7.5  ALBUMIN 3.5   No results for input(s): "LIPASE", "AMYLASE" in the last 168 hours. No results for input(s): "AMMONIA" in the last 168 hours. Coagulation Profile: Recent Labs  Lab 10/03/23 0042  INR 1.0   Cardiac Enzymes: No results for input(s): "CKTOTAL", "CKMB", "CKMBINDEX", "TROPONINI" in the last 168 hours. BNP (last 3 results) No results for input(s): "PROBNP" in the last 8760 hours. HbA1C: No results for input(s): "HGBA1C" in the last 72 hours. CBG: Recent Labs  Lab 10/03/23 1717 10/03/23 2112 10/04/23 0741 10/04/23 1139 10/04/23 1142  GLUCAP 181* 219* 193* 429* 410*   Lipid Profile: No results for input(s): "CHOL", "HDL", "LDLCALC", "TRIG", "CHOLHDL", "LDLDIRECT" in the last 72 hours. Thyroid Function Tests: Recent Labs    10/03/23 0042  TSH 1.704  FREET4 1.63*   Anemia Panel: No results for input(s): "VITAMINB12", "FOLATE", "FERRITIN", "TIBC", "IRON", "RETICCTPCT" in the last 72 hours. Sepsis Labs: Recent Labs  Lab 10/03/23 0042  LATICACIDVEN 1.6    Recent Results (from the past 240 hours)  Resp panel by RT-PCR (RSV, Flu A&B, Covid) Anterior Nasal Swab     Status: None   Collection Time: 10/03/23 12:42 AM   Specimen: Anterior Nasal Swab  Result Value Ref Range Status   SARS Coronavirus 2 by RT PCR NEGATIVE NEGATIVE Final    Comment: (NOTE) SARS-CoV-2 target nucleic acids are NOT DETECTED.  The SARS-CoV-2 RNA is generally detectable in upper respiratory specimens during the acute phase of infection. The lowest concentration of SARS-CoV-2 viral copies this assay can detect is 138 copies/mL. A negative result does not preclude SARS-Cov-2 infection and should not be used as the sole basis for treatment or other patient management decisions. A negative result may occur with  improper specimen collection/handling, submission of specimen  other than nasopharyngeal swab, presence of viral mutation(s) within the areas targeted by this assay, and inadequate number of viral copies(<138 copies/mL). A negative result must be combined with clinical observations, patient history, and epidemiological information. The expected result is Negative.  Fact Sheet for Patients:  BloggerCourse.com  Fact Sheet for Healthcare Providers:  SeriousBroker.it  This test is no t yet approved or cleared by the [place] FDA and  has been authorized for detection and/or diagnosis of SARS-CoV-2 by FDA under an Emergency Use Authorization (EUA). This EUA will remain  in effect (meaning this test can be used) for the duration of the COVID-19 declaration under Section 564(b)(1) of the Act, 21 U.S.C.section 360bbb-3(b)(1), unless the authorization is terminated  or revoked sooner.       Influenza A by PCR NEGATIVE NEGATIVE Final   Influenza B by PCR NEGATIVE NEGATIVE Final    Comment: (NOTE) The Xpert Xpress SARS-CoV-2/FLU/RSV plus assay is intended as an aid in the diagnosis of influenza from Nasopharyngeal swab specimens and should not be used as a sole basis for treatment. Nasal washings and aspirates are unacceptable for Xpert Xpress SARS-CoV-2/FLU/RSV testing.  Fact Sheet  for Patients: BloggerCourse.com  Fact Sheet for Healthcare Providers: SeriousBroker.it  This test is not yet approved or cleared by the [place] FDA and has been authorized for detection and/or diagnosis of SARS-CoV-2 by FDA under an Emergency Use Authorization (EUA). This EUA will remain in effect (meaning this test can be used) for the duration of the COVID-19 declaration under Section 564(b)(1) of the Act, 21 U.S.C. section 360bbb-3(b)(1), unless the authorization is terminated or revoked.     Resp Syncytial Virus by PCR NEGATIVE NEGATIVE Final     Comment: (NOTE) Fact Sheet for Patients: BloggerCourse.com  Fact Sheet for Healthcare Providers: SeriousBroker.it  This test is not yet approved or cleared by the [place] FDA and has been authorized for detection and/or diagnosis of SARS-CoV-2 by FDA under an Emergency Use Authorization (EUA). This EUA will remain in effect (meaning this test can be used) for the duration of the COVID-19 declaration under Section 564(b)(1) of the Act, 21 U.S.C. section 360bbb-3(b)(1), unless the authorization is terminated or revoked.  Performed at [facility], [address] [place], [place] [address]   Blood Culture (routine x 2)     Status: None (Preliminary result)   Collection Time: 10/03/23 12:43 AM   Specimen: BLOOD  Result Value Ref Range Status   Specimen Description BLOOD RIGHT FA  Final   Special Requests   Final    BOTTLES DRAWN AEROBIC AND ANAEROBIC Blood Culture results may not be optimal due to an inadequate volume of blood received in culture bottles   Culture   Final    NO GROWTH 1 DAY Performed at [facility], [address] [place], [place] [address]    Report Status PENDING  Incomplete  Urine Culture     Status: Abnormal (Preliminary result)   Collection Time: 10/03/23  1:42 AM   Specimen: Urine, Random  Result Value Ref Range Status   Specimen Description   Final    URINE, RANDOM Performed at [facility], [address] [place], [place] [address]    Special Requests   Final    NONE Reflexed from [id number] Performed at [facility], [address] [place], [place] [address]    Culture (A)  Final    >=100,000 COLONIES/mL ESCHERICHIA COLI 80,000 COLONIES/mL GROUP B STREP(S.AGALACTIAE)ISOLATED TESTING AGAINST S. AGALACTIAE NOT ROUTINELY PERFORMED DUE TO PREDICTABILITY OF AMP/PEN/VAN SUSCEPTIBILITY. Performed at [facility], [address], [place], [place]  [address]    Report Status PENDING  Incomplete         Radiology Studies: CT Head Wo Contrast Result Date: 10/03/2023 CLINICAL DATA:  Mental status change EXAM: CT HEAD WITHOUT CONTRAST TECHNIQUE: Contiguous axial images were obtained from the base of the skull through the vertex without intravenous contrast. RADIATION DOSE REDUCTION: This exam was performed according to the departmental dose-optimization program which includes automated exposure control, adjustment of the mA and/or kV according to patient size and/or use of iterative reconstruction technique. COMPARISON:  None Available. FINDINGS: Brain: No evidence of acute infarction, hemorrhage, hydrocephalus, extra-axial collection or mass lesion/mass effect. There is mild diffuse atrophy. There is mild periventricular white matter hypodensity, likely chronic small vessel ischemic change. Vascular: Atherosclerotic calcifications are present within the cavernous internal carotid arteries. Skull: Normal. Negative for fracture or focal lesion. Sinuses/Orbits: No acute finding. Other: None. IMPRESSION: 1. No acute intracranial process. 2. Mild diffuse atrophy and chronic small vessel ischemic change. Electronically Signed   By: Mcneil Sober.D.  On: 10/03/2023 03:11   DG Chest Port 1 View Result Date: 10/03/2023 CLINICAL DATA:  Sepsis EXAM: PORTABLE CHEST 1 VIEW COMPARISON:  Chest x-ray 09/02/2011 FINDINGS: The heart size and mediastinal contours are within normal limits. Both lungs are clear. The visualized skeletal structures are unremarkable. IMPRESSION: No active disease. Electronically Signed   By: Darliss Cheney M.D.   On: 10/03/2023 01:05        Scheduled Meds:  vitamin C  1,000 mg Oral QPM   aspirin EC  81 mg Oral Daily   cephALEXin  500 mg Oral Q12H   cholecalciferol  5,000 Units Oral Daily   clopidogrel  75 mg Oral Daily   enoxaparin (LOVENOX) injection  40 mg Subcutaneous Q24H   gabapentin  100 mg Oral QHS   insulin aspart  0-15  Units Subcutaneous TID WC   insulin aspart  0-5 Units Subcutaneous QHS   [START ON 10/05/2023] insulin glargine-yfgn  20 Units Subcutaneous Q24H   levothyroxine  175 mcg Oral Q0600   loratadine  10 mg Oral QHS   losartan  25 mg Oral q morning   metoprolol tartrate  50 mg Oral BID   potassium chloride SA  20 mEq Oral Daily   rosuvastatin  20 mg Oral Daily   Continuous Infusions:  lactated ringers Stopped (10/03/23 0513)     LOS: 1 day     Tresa Moore, MD [facility]   If 7PM-7AM, please contact night-coverage  10/04/2023, 3:40 PM

## 2023-10-04 NOTE — Evaluation (Signed)
 Physical Therapy Evaluation Patient Details Name: Alicia Medina MRN: [id number] DOB: Aug 27, 1932 Today's Date: 10/04/2023  History of Present Illness  Pt is an 89 y.o. Caucasian female with medical history significant for type 2 diabetes mellitus, coronary artery disease, osteoarthritis, diverticulosis, hypertension, dyslipidemia and hypothyroidism, NSTEMI, who presented to the emergency room with acute onset of altered mental status.   Clinical Impression  Pt seated EOB with family, oriented to self and family, but displayed some situational awareness confusion. unable to state place as well. family endorsed she is more confused today than at baseline. Per pt/family report, she is modI-I for ADLs, lives alone. Has a SPC she'll use intermittently. Pt denied falls but family reports potentially 1-2 falls in the last 6 weeks or so.   She displayed good sitting balance, at one point leaned back like she was planning on resting in a recliner, but no back support at EOB. Able to correct and come back to sitting, and finish her coffee without upper extremity support, often choosing to sit cross legged as well. She was able to stand and ambulate with supervision, ambulated ~127ft. Noted for decreased gait velocity, occasional gait path deviations and one LOB with CGA to correct. Pt also reached for single upper extremity support as well (endorsed furniture walking to OT at home). No true physical assistance needed during session. Overall the pt appears to be nearing her baseline level of mobility, but would benefit from further skilled PT intervention to maximize safety, minimize risk of falls, and to improve function      If plan is discharge home, recommend the following: Assist for transportation;Help with stairs or ramp for entrance;Direct supervision/assist for medications management   Can travel by private vehicle        Equipment Recommendations None recommended by PT (pt has RW and SPC at home)   Recommendations for Other Services       Functional Status Assessment Patient has had a recent decline in their functional status and demonstrates the ability to make significant improvements in function in a reasonable and predictable amount of time.     Precautions / Restrictions Precautions Precautions: Fall Recall of Precautions/Restrictions: Intact Restrictions Weight Bearing Restrictions Per Provider Order: No      Mobility  Bed Mobility               General bed mobility comments: pt sitting EOB with family at start and end of session    Transfers Overall transfer level: Needs assistance Equipment used: None Transfers: Sit to/from Stand Sit to Stand: Supervision                Ambulation/Gait Ambulation/Gait assistance: Supervision, Contact guard assist Gait Distance (Feet): 190 Feet Assistive device: None         General Gait Details: pt did intermittently reach for single UE support. 1 LOB required CGA to correct but no further LOB noted.  Stairs            Wheelchair Mobility     Tilt Bed    Modified Rankin (Stroke Patients Only)       Balance Overall balance assessment: Needs assistance Sitting-balance support: Feet supported Sitting balance-Leahy Scale: Good     Standing balance support: During functional activity, Single extremity supported Standing balance-Leahy Scale: Good                               Pertinent Vitals/Pain Pain Assessment Pain  Assessment: No/denies pain    Home Living Family/patient expects to be discharged to:: Private residence Living Arrangements: Alone Available Help at Discharge: Available PRN/intermittently;Neighbor;Friend(s) Type of Home: House Home Access: Stairs to enter Entrance Stairs-Rails: Right Entrance Stairs-Number of Steps: 3-4   Home Layout: One level Home Equipment: Shower seat;Cane - single point (Grab bars in gararge) Additional Comments: 146ft + drive way to  mainbox walks daily    Prior Function Prior Level of Function : Driving;History of Falls (last six months) (Fall in bedroom, shown family bruises, pt not able to recall fall.)             Mobility Comments: Utilizes SPC on occasion, often leaves it in car. Reports uses cart when at [employer] for support ADLs Comments: Pt completed ADLs MODI, reports often doesnt make it to bathroom, wears depends daily. Pt family reports pt has recently been getting lost when driving around in town, and overall decline in the pt's ability to take care of herself.     Extremity/Trunk Assessment   Upper Extremity Assessment Upper Extremity Assessment: Overall WFL for tasks assessed    Lower Extremity Assessment Lower Extremity Assessment: Overall WFL for tasks assessed       Communication        Cognition Arousal: Alert Behavior During Therapy: WFL for tasks assessed/performed                           PT - Cognition Comments: pt oriented to self and family, but displayed some situational awareness. unable to state place as well. family endorsed she is more confused today than at baseline as well Following commands: Intact       Cueing       General Comments      Exercises     Assessment/Plan    PT Assessment Patient needs continued PT services  PT Problem List Decreased activity tolerance;Decreased balance;Decreased mobility       PT Treatment Interventions DME instruction;Balance training;Gait training;Neuromuscular re-education;Stair training;Functional mobility training;Patient/family education;Therapeutic activities;Therapeutic exercise    PT Goals (Current goals can be found in the Care Plan section)  Acute Rehab PT Goals Patient Stated Goal: to go home PT Goal Formulation: With patient Time For Goal Achievement: 10/18/23 Potential to Achieve Goals: Good    Frequency Min 1X/week     Co-evaluation               AM-PAC PT "6 Clicks" Mobility   Outcome Measure Help needed turning from your back to your side while in a flat bed without using bedrails?: None Help needed moving from lying on your back to sitting on the side of a flat bed without using bedrails?: None Help needed moving to and from a bed to a chair (including a wheelchair)?: None Help needed standing up from a chair using your arms (e.g., wheelchair or bedside chair)?: None Help needed to walk in hospital room?: A Little Help needed climbing 3-5 steps with a railing? : A Little 6 Click Score: 22    End of Session Equipment Utilized During Treatment: Gait belt Activity Tolerance: Patient tolerated treatment well Patient left: Other (comment) (seated EOB with family) Nurse Communication: Mobility status PT Visit Diagnosis: Other abnormalities of gait and mobility (R26.89);Difficulty in walking, not elsewhere classified (R26.2);Muscle weakness (generalized) (M62.81)    Time: 0981-1914 PT Time Calculation (min) (ACUTE ONLY): 15 min   Charges:   PT Evaluation $PT Eval Low Complexity: 1 Low  PT General Charges $$ ACUTE PT VISIT: 1 Visit        Olga Coaster PT, DPT 9:34 AM,10/04/23

## 2023-10-04 NOTE — Progress Notes (Addendum)
 Patient IV's lost access, removed by this RN. Pt needed antibiotic hung. Attempted another IV and unsuccessful. Charge RN Trinna Post was able to get another IV and it was flushing but met a lot of resistance. Per patient/family request, that Iv removed as well and patient/family requesting for patient to not have IV in. Dr. Georgeann Oppenheim notified via secure chat and Dr. Georgeann Oppenheim states it is okay for patient to not have IV for now and changed IV antibiotic to oral.

## 2023-10-04 NOTE — TOC Initial Note (Signed)
 Transition of Care [facility]) - Initial/Assessment Note    Patient Details  Name: Alicia Medina MRN: [id number] Date of Birth: [birth year]  [facility]) CM/SW Contact:    Chapman Fitch, RN Phone Number: 10/04/2023, 4:41 PM  Clinical Narrative:                    TOC full note to follow.  Spoke with son Joette Catching, he is unsure about home health at this time.  He is not sure if mother will allow them in the home.    He states that he in his brother will stay with her through the weekend, and tonight they are going to discuss option of private pay placement, and private pay in home care.  Provided him with the information below   These agencies are NOT affiliated with [facility] in any way, These are all Private Pay, not covered by [employer].  The cost will vary depending on level of care needed.  This list is used as a Sports administrator and not a recommendation.  PCS (Personal Care Service) service and placement assistance agency [facility] Does not require a Contract or Min number of hours  PCS (Personal Care Service) service and placement assistance agency [facility] , Valeda Malm,  [phone] Does have a Minimum number of hours required  [facility] (Personal Care Service) [facility], Mellody Drown [phone] Does have a Minimum number of hours required       Patient Goals and CMS Choice            Expected Discharge Plan and Services                                              Prior Living Arrangements/Services                       Activities of Daily Living   ADL Screening (condition at time of admission) Independently performs ADLs?: Yes (appropriate for developmental age) Is the patient deaf or have difficulty hearing?: Yes Does the patient have difficulty seeing, even when wearing glasses/contacts?: No Does the patient have difficulty concentrating, remembering, or making decisions?: No  Permission  Sought/Granted                  Emotional Assessment              Admission diagnosis:  Dehydration [E86.0] Uncontrolled type 2 diabetes mellitus with hyperosmolar nonketotic hyperglycemia (HCC) [E11.00] Hyperglycemic crisis in diabetes mellitus (HCC) [E11.65] Altered mental status, unspecified altered mental status type [R41.82] Hyperosmolar hyperglycemic state (HHS) (HCC) [E11.00] Patient Active Problem List   Diagnosis Date Noted   Uncontrolled type 2 diabetes mellitus with hyperosmolar nonketotic hyperglycemia (HCC) 10/03/2023   Acute lower UTI 10/03/2023   Essential hypertension 10/03/2023   Diabetic neuropathy (HCC) 10/03/2023   Dyslipidemia 10/03/2023   Hypothyroidism 10/03/2023   Hyperosmolar hyperglycemic state (HHS) (HCC) 10/03/2023   Anxiety 11/16/2022   Change in bowel habit 11/16/2022   Diarrhea 11/16/2022   Numbness 07/02/2021   Mucoid cyst, joint 08/14/2020   Osteoarthritis of finger of right hand 08/14/2020   Ankle pain, chronic 09/26/2019   Pain in right knee 08/30/2019   Pain due to onychomycosis of toenails of both feet 06/28/2019   History of noncompliance with medical treatment 11/10/2018   CAD (  coronary artery disease) 11/01/2018   Angina pectoris (HCC) 10/11/2018   Abnormal nuclear stress test 10/11/2018   Diabetes mellitus due to underlying condition with stage 3 chronic kidney disease, without long-term current use of insulin (HCC) 11/29/2017   Hx of thyroidectomy 09/13/2017   Noncompliance with diet and medication regimen 09/13/2017   Stress and adjustment reaction 06/11/2017   Strain of latissimus dorsi muscle 06/11/2017   Diabetes mellitus due to underlying condition with microalbuminuria, with long-term current use of insulin (HCC) 03/11/2017   History of vitamin D deficiency 03/11/2017   Other fatigue 03/11/2017   Abscess 09/03/2016   Hypothyroidism, postsurgical 07/22/2016   Acquired trigger finger 06/15/2016   Hypertension  associated with diabetes (HCC) 09/27/2014   Hemorrhoids, internal, with bleeding 05/08/2013   Hyperlipidemia associated with type 2 diabetes mellitus (HCC) 11/12/2011   Old inferior wall myocardial infarction 08/29/2011   PCP:  Pcp, No Pharmacy:   [facility] - [place], [place] - [address] [place] [place] [address] Phone: [phone] Fax: [phone]  [id number] [address]), [place] - [address] [place] ([place]) [place] [address] Phone: [phone] Fax: [phone]     Social Drivers of Health (SDOH) Social History: SDOH Screenings   Food Insecurity: No Food Insecurity (10/03/2023)  Housing: Low Risk  (10/03/2023)  Transportation Needs: No Transportation Needs (10/03/2023)  Utilities: Not At Risk (10/03/2023)  Depression (PHQ2-9): Low Risk  (11/01/2018)  Social Connections: Unknown (10/03/2023)  Tobacco Use: Low Risk  (05/10/2023)   SDOH Interventions:     Readmission Risk Interventions     No data to display

## 2023-10-04 NOTE — Progress Notes (Signed)
 Pt combative, agitated attempting to hit staff

## 2023-10-04 NOTE — Inpatient Diabetes Management (Addendum)
 Inpatient Diabetes Program Recommendations  AACE/ADA: New Consensus Statement on Inpatient Glycemic Control (2015)  Target Ranges:  Prepandial:   less than 140 mg/dL      Peak postprandial:   less than 180 mg/dL (1-2 hours)      Critically ill patients:  140 - 180 mg/dL    Latest Reference Range & Units 10/03/23 00:26 10/03/23 05:10 10/03/23 08:16 10/03/23 11:55 10/03/23 17:17 10/03/23 21:12  Glucose-Capillary 70 - 99 mg/dL >161 (HH)  IV Insulin Drip Started at 0358 225 (H)  IV Insulin Drip Stopped 201 (H)   15 units Semglee @0901  349 (H)  11 units Novolog  181 (H)  3 units Novolog  219 (H)  2 units Novolog     Latest Reference Range & Units 10/04/23 07:41 10/04/23 11:39  Glucose-Capillary 70 - 99 mg/dL 096 (H)  3 units Novolog  20 units Semglee 429 (H)  15 units Novolog   (H): Data is abnormally high  Admit with: AMS/ UTI/ HHNK  History: DM2  Home DM Meds: Trulicity 3 mg Qweek  Current Orders: Novolog Moderate Correction Scale/ SSI (0-15 units) TID AC + HS      Semglee 15 units Q24H    MD- Please consider increasing Semglee slightly to 18 units Daily  Please also consider adding Novolog Meal Coverage: Novolog 3 units TID with meals  HOLD if pt NPO HOLD if pt eats <50% meals      --Will follow patient during hospitalization--  Ambrose Finland RN, MSN, CDCES Diabetes Coordinator Inpatient Glycemic Control Team Team Pager: [phone] (8a-5p)

## 2023-10-04 NOTE — Progress Notes (Signed)
 OT Cancellation Note  Patient Details Name: Alicia Medina MRN: [id number] DOB: [birth year]   Cancelled Treatment:    Reason Eval/Treat Not Completed: Medical issues which prohibited therapy (Pt. with high Glucose levels at 410. Will continue to monitor and reattempt OT intervention as appropriate.)  [name] 10/04/2023, 1:50 PM

## 2023-10-05 ENCOUNTER — Other Ambulatory Visit: Payer: Self-pay

## 2023-10-05 DIAGNOSIS — E1100 Type 2 diabetes mellitus with hyperosmolarity without nonketotic hyperglycemic-hyperosmolar coma (NKHHC): Secondary | ICD-10-CM | POA: Diagnosis not present

## 2023-10-05 LAB — GLUCOSE, CAPILLARY
Glucose-Capillary: 104 mg/dL — ABNORMAL HIGH (ref 70–99)
Glucose-Capillary: 242 mg/dL — ABNORMAL HIGH (ref 70–99)
Glucose-Capillary: 206 mg/dL — ABNORMAL HIGH (ref 70–99)

## 2023-10-05 LAB — URINE CULTURE: Culture: >=100000 — AB

## 2023-10-05 LAB — BASIC METABOLIC PANEL
Anion gap: 10 (ref 5–15)
BUN: 19 mg/dL (ref 8–23)
CO2: 24 mmol/L (ref 22–32)
Calcium: 8.8 mg/dL — ABNORMAL LOW (ref 8.9–10.3)
Chloride: 104 mmol/L (ref 98–111)
Creatinine, Ser: 0.76 mg/dL (ref 0.44–1.00)
GFR, Estimated: >60 mL/min (ref >60)
Glucose, Bld: 116 mg/dL — ABNORMAL HIGH (ref 70–99)
Potassium: 3.5 mmol/L (ref 3.5–5.1)
Sodium: 138 mmol/L (ref 135–145)

## 2023-10-05 LAB — HEMOGLOBIN A1C
Hgb A1c MFr Bld: >15.5 % — ABNORMAL HIGH (ref 4.8–5.6)
Mean Plasma Glucose: >398 mg/dL

## 2023-10-05 MED ORDER — LOSARTAN POTASSIUM 25 MG PO TABS
25.0000 mg | ORAL_TABLET | Freq: Every morning | ORAL | 0 refills | Status: AC
  Filled 2023-10-05: qty 30, 30d supply, fill #0

## 2023-10-05 MED ORDER — CLOPIDOGREL BISULFATE 75 MG PO TABS
75.0000 mg | ORAL_TABLET | Freq: Every day | ORAL | 0 refills | Status: AC
  Filled 2023-10-05: qty 30, 30d supply, fill #0

## 2023-10-05 MED ORDER — ACCU-CHEK SOFTCLIX LANCETS MISC
1.0000 | Freq: Three times a day (TID) | 0 refills | Status: AC
  Filled 2023-10-05 (×2): qty 100, 30d supply, fill #0

## 2023-10-05 MED ORDER — BLOOD GLUCOSE MONITOR SYSTEM W/DEVICE KIT
1.0000 | PACK | Freq: Three times a day (TID) | 0 refills | Status: AC
  Filled 2023-10-05: qty 1, 30d supply, fill #0
  Filled 2023-10-05: qty 1, fill #0

## 2023-10-05 MED ORDER — PIOGLITAZONE HCL 15 MG PO TABS
15.0000 mg | ORAL_TABLET | Freq: Every day | ORAL | 0 refills | Status: DC
  Filled 2023-10-05: qty 30, 30d supply, fill #0

## 2023-10-05 MED ORDER — INSULIN GLARGINE 100 UNIT/ML SOLOSTAR PEN
10.0000 [IU] | PEN_INJECTOR | Freq: Every day | SUBCUTANEOUS | 1 refills | Status: DC
Start: 2023-10-05 — End: 2023-12-04
  Filled 2023-10-05: qty 3, 30d supply, fill #0

## 2023-10-05 MED ORDER — ACETAMINOPHEN 500 MG PO TABS: 1000.0000 mg | ORAL_TABLET | Freq: Four times a day (QID) | ORAL | Status: AC | PRN

## 2023-10-05 MED ORDER — LEVOTHYROXINE SODIUM 175 MCG PO TABS
175.0000 ug | ORAL_TABLET | Freq: Every day | ORAL | 0 refills | Status: AC
  Filled 2023-10-05: qty 30, 30d supply, fill #0

## 2023-10-05 MED ORDER — ROSUVASTATIN CALCIUM 20 MG PO TABS
20.0000 mg | ORAL_TABLET | Freq: Every day | ORAL | 0 refills | Status: AC
  Filled 2023-10-05: qty 30, 30d supply, fill #0

## 2023-10-05 MED ORDER — LANCET DEVICE MISC
1.0000 | Freq: Three times a day (TID) | 0 refills | Status: AC
  Filled 2023-10-05: qty 1, 30d supply, fill #0

## 2023-10-05 MED ORDER — LINAGLIPTIN 5 MG PO TABS
5.0000 mg | ORAL_TABLET | Freq: Every day | ORAL | Status: DC
  Filled 2023-10-05 (×2): qty 1

## 2023-10-05 MED ORDER — CEPHALEXIN 500 MG PO CAPS
500.0000 mg | ORAL_CAPSULE | Freq: Two times a day (BID) | ORAL | 0 refills | Status: AC
  Filled 2023-10-05: qty 4, 2d supply, fill #0

## 2023-10-05 MED ORDER — BLOOD GLUCOSE TEST VI STRP
1.0000 | ORAL_STRIP | Freq: Three times a day (TID) | 0 refills | Status: AC
Start: 2023-10-05 — End: 2023-11-04
  Filled 2023-10-05 (×2): qty 100, 34d supply, fill #0

## 2023-10-05 MED ORDER — METOPROLOL TARTRATE 50 MG PO TABS
50.0000 mg | ORAL_TABLET | Freq: Two times a day (BID) | ORAL | 0 refills | Status: AC
  Filled 2023-10-05: qty 30, 15d supply, fill #0

## 2023-10-05 MED ORDER — INSULIN PEN NEEDLE 32G X 4 MM MISC
1.0000 | Freq: Three times a day (TID) | 0 refills | Status: AC
  Filled 2023-10-05: qty 100, 33d supply, fill #0

## 2023-10-05 MED ORDER — JANUVIA 25 MG PO TABS
25.0000 mg | ORAL_TABLET | Freq: Every day | ORAL | 0 refills | Status: DC
Start: 2023-10-05 — End: 2023-10-05
  Filled 2023-10-05: qty 30, 30d supply, fill #0

## 2023-10-05 MED ORDER — LINAGLIPTIN 5 MG PO TABS
5.0000 mg | ORAL_TABLET | Freq: Every day | ORAL | 0 refills | Status: DC
  Filled 2023-10-05: qty 30, 30d supply, fill #0

## 2023-10-05 MED ORDER — GABAPENTIN 100 MG PO CAPS
100.0000 mg | ORAL_CAPSULE | Freq: Two times a day (BID) | ORAL | 0 refills | Status: AC
  Filled 2023-10-05: qty 60, 30d supply, fill #0

## 2023-10-05 NOTE — Inpatient Diabetes Management (Addendum)
 Inpatient Diabetes Program Recommendations  AACE/ADA: New Consensus Statement on Inpatient Glycemic Control (2015)  Target Ranges:  Prepandial:   less than 140 mg/dL      Peak postprandial:   less than 180 mg/dL (1-2 hours)      Critically ill patients:  140 - 180 mg/dL    Latest Reference Range & Units 11/05/22 15:53 05/03/23 14:09  Hemoglobin A1C 4.0 - 5.6 % -  10.6 (H) 9.4 !  223 mg/dl  (H): Data is abnormally high !: Data is abnormal  Latest Reference Range & Units 10/04/23 07:41 10/04/23 11:39 10/04/23 11:42 10/04/23 16:59 10/04/23 19:33  Glucose-Capillary 70 - 99 mg/dL 161 (H)  3 units Novolog  15 units Semglee  429 (H) 410 (H)  15 units Novolog  5 units Semglee  96 282 (H)  3 units Novolog   (H): Data is abnormally high   Admit with: AMS/ UTI/ HHNK   History: DM2   Home DM Meds: Trulicity 3 mg Qweek   Current Orders: Novolog Moderate Correction Scale/ SSI (0-15 units) TID AC + HS                            Semglee 20 units Q24H     Met w/ pt and family today at bedside.  Pt very HOH and does not have her hearing aid--Pt also spent the majority of my visit (with them) in the bathroom--Conversation focused mostly with 3 children in the room.  Children told me pt started on Metformin years ago--Stopped Metformin due to diarrhea.  Pt was started on Ozempic weekly (unsure when or timeline)--Ozempic was stopped b/c pt still have GI symptoms.  Children told me pt eventually started on Trulicity.  They do not know when Trulicity was started and they could not tell me how long pt stopped taking it at home.  Per children, pt's house is cluttered and dirty.  Has lots of snack foods at home and they think pt not taking good care of herself--Children think her dementia is worse.  They all live out of town and cannot check on her everyday.  Children told me they either want pt to go to ALF, move to [place] to be close to them and family, or have private home care come to pt's  house.  Children concerned pt will not let home care into the house though.  I discussed with children that we have pt on both long and quick acting insulin in the hospital.  Children are OK with this but told me sometimes the pt gives the RN a hard time about administering the insulin and they think if pt goes home she may refuse insulin and/or oral meds.  They are overwhelmed and are trying to figure out a plan.  I discussed with children that our goal should not be perfect CBGs at home but that glucose levels >300 all the time would be unsafe.  I discussed with children that I will talk with the Attending MD and try to figure out a safe discharge medication plan and will come back and talk with them about it as well.    Addendum 1pm--Met again with children.  A1c was still Pending at the time of my re-visit.  Discussed the plan of stopping Lantus for now and starting Tradjenta 5 mg daily.  Explained what Jearld Lesch is and how it works.  Discussed with family that pt will need glucometer for home so that whomever  helps with care at home can help monitor pt's CBGs as well.  Discussed with family that if the A1c comes back significantly elevated we may need to pivot and add insulin for home and we will come back and help with learning insulin admin for home with the children.   Addendum 2pm--Secure chat received from Dr. Georgeann Oppenheim.  A1c came back at >15.5%.  Per Dr. Georgeann Oppenheim, we likely need to also d/c pt on once daily Lantus in addition to the Tradjenta.  Dr. Georgeann Oppenheim told me he has to go back and see pt and her children this afternoon and will address that with them.  Addendum 4:30pm--Spoke w/ MD, TOC team, pharmacy.  Pt insurance does not cover Tradjenta--Januvia co-pay $300.  Decision made to try Actos 15 mg daily which is $10 co-pay.  Met w/ pt and family again.  Reviewed d/c med plan.  They are OK to start with Lantus 10 units daily + Actos 15 mg daily.  Reviewed both meds and how they work, side  effects, importance of timing, etc.  Pt needs follow up with PCP and ENDO.  Educated patient's children on insulin pen use at home.  Reviewed all steps of insulin pen including attachment of needle, 2-unit air shot, dialing up dose, giving injection, rotation of injection sites, removing needle, disposal of sharps, storage of unused insulin, disposal of insulin etc.  Patient's son able to provide successful return demonstration.  Reviewed troubleshooting with insulin pen.  Also reviewed Signs/Symptoms of Hypoglycemia with patient and how to treat Hypoglycemia at home.  Have asked RNs caring for patient to please allow patient to give all injections here in hospital as much as possible for practice.  MD to give patient Rxs for insulin pens and insulin pen needles.  Pt's family will go to [facility] and purchase CBG meter and supplies OTC after d/c.     --Will follow patient during hospitalization--  Ambrose Finland RN, MSN, CDCES Diabetes Coordinator Inpatient Glycemic Control Team Team Pager: [phone] (8a-5p)

## 2023-10-05 NOTE — Plan of Care (Signed)

## 2023-10-05 NOTE — TOC Transition Note (Signed)
 Transition of Care [facility]) - Discharge Note   Patient Details  Name: Alicia Medina MRN: [id number] Date of Birth: 25-Aug-1932  [facility]) CM/SW Contact:  Chapman Fitch, RN Phone Number: 10/05/2023, 4:34 PM   Clinical Narrative:     Met with patient, sons vincent and myron, along with MD  Patient will discharge home today Patient will not have home health services as she refuses to be seen by PCP  Family will be staying with patient to assist her, and ensure she takes her medications until they are able to arrange private pay care givers in the home. Son Oswaldo Done confirms they are working with [employer] to arrange caregivers.    Family to transport today. Myron lives in [place], his wife is making calls to ALFs and independent living facilities in [place] to see what options are   Meds to beds prior to discharge         Patient Goals and CMS Choice            Discharge Placement                       Discharge Plan and Services Additional resources added to the After Visit Summary for                                       Social Drivers of Health (SDOH) Interventions SDOH Screenings   Food Insecurity: No Food Insecurity (10/03/2023)  Housing: Low Risk  (10/03/2023)  Transportation Needs: No Transportation Needs (10/03/2023)  Utilities: Not At Risk (10/03/2023)  Depression (PHQ2-9): Low Risk  (11/01/2018)  Social Connections: Unknown (10/03/2023)  Tobacco Use: Low Risk  (05/10/2023)     Readmission Risk Interventions     No data to display

## 2023-10-05 NOTE — Discharge Summary (Addendum)
 Physician Discharge Summary  Alicia Medina XBJ:[id number] DOB: [birth year] DOA: 10/03/2023  PCP: Pcp, No  Admit date: 10/03/2023 Discharge date: 10/05/2023  Admitted From: Home Disposition:  Home  Recommendations for Outpatient Follow-up:  Follow up with PCP in 1-2 weeks Follow-up with endocrinology  Home Health:No  Equipment/Devices:None   Discharge Condition:Stable  CODE STATUS:FULL  Diet recommendation: Carb mod  Brief/Interim Summary:  89 year old female who presents for hyperglycemic hyperosmolar state. No evidence of DKA. Likely in the setting of urinary tract infection. Patient has a document history of nonadherence to medications. Refuses to take insulin. Sugars on arrival greater than 600. Started on insulin gtt. via Endo tool protocol. Sugars have improved.    Glycemic control improved.  On oral treatment for urinary tract infection.  Likely no acute medical issues however discharge deferred at this time due to lack of safe disposition plan.  TOC in communication with family as home environment may not be safe to return to.  Extensive family meetings between myself, case manager, diabetes coordinator to discuss appropriate discharge plan.  Unfortunately patient has no PCP so we are unable to establish home health.  Discussed he may control has improved with assistance from the diabetes coordinator.  Urinary tract infection has resolved.  Stable for discharge home.  Patient has a long history of medication nonadherence and distrust of medical professionals.  Educated patient and family as best as possible.  Patient needs to establish with a primary care.  Patient's children indicate that he may bring her to [place] to live in close proximity to family.  Patient herself seems to be quite reluctant and just wishes to return home.  Hopefully patient understands the importance of medication adherence.  I feel that further medication nonadherence will invariably result in future  decompensation and hospitalizations.     Discharge Diagnoses:  Principal Problem:   Uncontrolled type 2 diabetes mellitus with hyperosmolar nonketotic hyperglycemia (HCC) Active Problems:   Acute lower UTI   Hypothyroidism   Dyslipidemia   Essential hypertension   Diabetic neuropathy (HCC)   Hyperosmolar hyperglycemic state (HHS) (HCC)   Hyperosmolar hyperglycemic state Likely secondary to dietary and medication nonadherence.  Initially required insulin via Endo tool protocol.  This has been weaned off and patient is relatively stable subcutaneous regimen.  At time of discharge appreciate recommendations from diabetes coordinator requiring outpatient diabetic regimen.  Hemoglobin A1c greater than 15.5 indicating very poor control   Acute UTI Completed 2 days of Keflex for total 5-day antibiotic course.  Discharge Instructions  Discharge Instructions     Diet - low sodium heart healthy   Complete by: As directed    Increase activity slowly   Complete by: As directed       Allergies as of 10/05/2023       Reactions   Demeclocycline Other (See Comments)   Severe stomach cramps Severe stomach cramps Severe stomach cramps   Lisinopril Cough   Tetracyclines & Related Other (See Comments)   Severe stomach cramps        Medication List     PAUSE taking these medications    Trulicity 3 MG/0.5ML Soaj Wait to take this until your doctor or other care provider tells you to start again. Generic drug: Dulaglutide Inject 3 mg as directed once a week.       STOP taking these medications    Cyanocobalamin 2500 MCG Tabs   potassium chloride SA 20 MEQ tablet Commonly known as: KLOR-CON M   S-Adenosylmethionine 400 MG  Tabs   Super Calcium 1500 (600 Ca) MG Tabs tablet Generic drug: calcium carbonate   traMADol 50 MG tablet Commonly known as: ULTRAM   vitamin C 1000 MG tablet       TAKE these medications    Accu-Chek Softclix Lancets lancets Use to check  blood sugar morning, at noon, and at bedtime.   acetaminophen 500 MG tablet Commonly known as: TYLENOL Take 2 tablets (1,000 mg total) by mouth every 6 (six) hours as needed for mild pain (pain score 1-3), fever or headache. What changed:  when to take this reasons to take this   aspirin EC 81 MG tablet Take 81 mg by mouth daily.   Blood Glucose Monitor System w/Device Kit Use to check blood sugar in the morning, at noon, and at bedtime.   cephALEXin 500 MG capsule Commonly known as: KEFLEX Take 1 capsule (500 mg total) by mouth every 12 (twelve) hours for 2 days.   clopidogrel 75 MG tablet Commonly known as: PLAVIX Take 1 tablet (75 mg total) by mouth daily. What changed: See the new instructions.   gabapentin 100 MG capsule Commonly known as: NEURONTIN Take 1 capsule (100 mg total) by mouth 2 (two) times daily. What changed: Another medication with the same name was removed. Continue taking this medication, and follow the directions you see here.   glucose blood test strip Commonly known as: True Metrix Blood Glucose Test 1 each by Other route 2 (two) times daily. And lancets 2/day What changed: Another medication with the same name was added. Make sure you understand how and when to take each.   BLOOD GLUCOSE TEST STRIPS Strp Check blood sugar in the morning, at noon, and at bedtime. What changed: You were already taking a medication with the same name, and this prescription was added. Make sure you understand how and when to take each.   insulin glargine 100 UNIT/ML Solostar Pen Commonly known as: LANTUS Inject 10 Units into the skin daily.   Insulin Pen Needle 32G X 4 MM Misc Use 3 (three) times daily.   Lancet Device Misc 1 each by Does not apply route in the morning, at noon, and at bedtime. May substitute to any manufacturer covered by patient's insurance.   levothyroxine 175 MCG tablet Commonly known as: Synthroid Take 1 tablet (175 mcg total) by mouth daily  before breakfast.   loperamide 2 MG tablet Commonly known as: IMODIUM A-D Take 1-2 mg by mouth as directed.   loratadine 10 MG tablet Commonly known as: CLARITIN Take 10 mg by mouth at bedtime.   losartan 25 MG tablet Commonly known as: COZAAR Take 1 tablet (25 mg total) by mouth every morning.   metoprolol tartrate 50 MG tablet Commonly known as: LOPRESSOR Take 1 tablet (50 mg total) by mouth 2 (two) times daily.   pioglitazone 15 MG tablet Commonly known as: Actos Take 1 tablet (15 mg total) by mouth daily.   rosuvastatin 20 MG tablet Commonly known as: CRESTOR Take 1 tablet (20 mg total) by mouth daily.   Vitamin D-3 125 MCG (5000 UT) Tabs Take 5,000 Units by mouth daily.   Vitamin E 500 UNIT/GM Powd Take by mouth.        Follow-up Information     Marisue Ivan, MD Follow up.   Specialty: Family Medicine Why: [facility].  Please contact to establish care if you would like.  Multiple available physicians Contact information: [address] [facility]  [place] [address] [phone]                Allergies  Allergen Reactions   Demeclocycline Other (See Comments)    Severe stomach cramps Severe stomach cramps Severe stomach cramps   Lisinopril Cough   Tetracyclines & Related Other (See Comments)    Severe stomach cramps    Consultations: None   Procedures/Studies: CT Head Wo Contrast Result Date: 10/03/2023 CLINICAL DATA:  Mental status change EXAM: CT HEAD WITHOUT CONTRAST TECHNIQUE: Contiguous axial images were obtained from the base of the skull through the vertex without intravenous contrast. RADIATION DOSE REDUCTION: This exam was performed according to the departmental dose-optimization program which includes automated exposure control, adjustment of the mA and/or kV according to patient size and/or use of iterative reconstruction technique. COMPARISON:  None Available.  FINDINGS: Brain: No evidence of acute infarction, hemorrhage, hydrocephalus, extra-axial collection or mass lesion/mass effect. There is mild diffuse atrophy. There is mild periventricular white matter hypodensity, likely chronic small vessel ischemic change. Vascular: Atherosclerotic calcifications are present within the cavernous internal carotid arteries. Skull: Normal. Negative for fracture or focal lesion. Sinuses/Orbits: No acute finding. Other: None. IMPRESSION: 1. No acute intracranial process. 2. Mild diffuse atrophy and chronic small vessel ischemic change. Electronically Signed   By: Darliss Cheney M.D.   On: 10/03/2023 03:11   DG Chest Port 1 View Result Date: 10/03/2023 CLINICAL DATA:  Sepsis EXAM: PORTABLE CHEST 1 VIEW COMPARISON:  Chest x-ray 09/02/2011 FINDINGS: The heart size and mediastinal contours are within normal limits. Both lungs are clear. The visualized skeletal structures are unremarkable. IMPRESSION: No active disease. Electronically Signed   By: Darliss Cheney M.D.   On: 10/03/2023 01:05      Subjective: Seen and examined on the day of discharge.  Stable no distress.  Appropriate for discharge home.  Discharge Exam: Vitals:   10/05/23 0311 10/05/23 1000  BP: 132/86 135/79  Pulse: 99 93  Resp: 20 16  Temp: 98.3 F (36.8 C) 98.1 F (36.7 C)  SpO2: 99% 96%   Vitals:   10/04/23 1941 10/04/23 2151 10/05/23 0311 10/05/23 1000  BP: 101/77 110/78 132/86 135/79  Pulse: (!) 104 100 99 93  Resp: 16  20 16   Temp: 98.4 F (36.9 C)  98.3 F (36.8 C) 98.1 F (36.7 C)  TempSrc: Oral  Oral Oral  SpO2: 94%  99% 96%  Weight:        General: Pt is alert, awake, not in acute distress Cardiovascular: RRR, S1/S2 +, no rubs, no gallops Respiratory: CTA bilaterally, no wheezing, no rhonchi Abdominal: Soft, NT, ND, bowel sounds + Extremities: no edema, no cyanosis    The results of significant diagnostics from this hospitalization (including imaging, microbiology,  ancillary and laboratory) are listed below for reference.     Microbiology: Recent Results (from the past 240 hours)  Resp panel by RT-PCR (RSV, Flu A&B, Covid) Anterior Nasal Swab     Status: None   Collection Time: 10/03/23 12:42 AM   Specimen: Anterior Nasal Swab  Result Value Ref Range Status   SARS Coronavirus 2 by RT PCR NEGATIVE NEGATIVE Final    Comment: (NOTE) SARS-CoV-2 target nucleic acids are NOT DETECTED.  The SARS-CoV-2 RNA is generally detectable in upper respiratory specimens during the acute phase of infection. The lowest concentration of SARS-CoV-2 viral copies this assay can detect is 138 copies/mL. A negative result does not preclude SARS-Cov-2 infection and should not be used as the sole basis  for treatment or other patient management decisions. A negative result may occur with  improper specimen collection/handling, submission of specimen other than nasopharyngeal swab, presence of viral mutation(s) within the areas targeted by this assay, and inadequate number of viral copies(<138 copies/mL). A negative result must be combined with clinical observations, patient history, and epidemiological information. The expected result is Negative.  Fact Sheet for Patients:  BloggerCourse.com  Fact Sheet for Healthcare Providers:  SeriousBroker.it  This test is no t yet approved or cleared by the [place] FDA and  has been authorized for detection and/or diagnosis of SARS-CoV-2 by FDA under an Emergency Use Authorization (EUA). This EUA will remain  in effect (meaning this test can be used) for the duration of the COVID-19 declaration under Section 564(b)(1) of the Act, 21 U.S.C.section 360bbb-3(b)(1), unless the authorization is terminated  or revoked sooner.       Influenza A by PCR NEGATIVE NEGATIVE Final   Influenza B by PCR NEGATIVE NEGATIVE Final    Comment: (NOTE) The Xpert Xpress SARS-CoV-2/FLU/RSV  plus assay is intended as an aid in the diagnosis of influenza from Nasopharyngeal swab specimens and should not be used as a sole basis for treatment. Nasal washings and aspirates are unacceptable for Xpert Xpress SARS-CoV-2/FLU/RSV testing.  Fact Sheet for Patients: BloggerCourse.com  Fact Sheet for Healthcare Providers: SeriousBroker.it  This test is not yet approved or cleared by the [place] FDA and has been authorized for detection and/or diagnosis of SARS-CoV-2 by FDA under an Emergency Use Authorization (EUA). This EUA will remain in effect (meaning this test can be used) for the duration of the COVID-19 declaration under Section 564(b)(1) of the Act, 21 U.S.C. section 360bbb-3(b)(1), unless the authorization is terminated or revoked.     Resp Syncytial Virus by PCR NEGATIVE NEGATIVE Final    Comment: (NOTE) Fact Sheet for Patients: BloggerCourse.com  Fact Sheet for Healthcare Providers: SeriousBroker.it  This test is not yet approved or cleared by the [place] FDA and has been authorized for detection and/or diagnosis of SARS-CoV-2 by FDA under an Emergency Use Authorization (EUA). This EUA will remain in effect (meaning this test can be used) for the duration of the COVID-19 declaration under Section 564(b)(1) of the Act, 21 U.S.C. section 360bbb-3(b)(1), unless the authorization is terminated or revoked.  Performed at [facility], [address] [place], [place] [address]   Blood Culture (routine x 2)     Status: None (Preliminary result)   Collection Time: 10/03/23 12:43 AM   Specimen: BLOOD  Result Value Ref Range Status   Specimen Description BLOOD RIGHT FA  Final   Special Requests   Final    BOTTLES DRAWN AEROBIC AND ANAEROBIC Blood Culture results may not be optimal due to an inadequate volume of blood received in culture bottles    Culture   Final    NO GROWTH 2 DAYS Performed at [facility], [address], [place], [place] [address]    Report Status PENDING  Incomplete  Urine Culture     Status: Abnormal   Collection Time: 10/03/23  1:42 AM   Specimen: Urine, Random  Result Value Ref Range Status   Specimen Description   Final    URINE, RANDOM Performed at [facility], [address], [place], [place] [address]    Special Requests   Final    NONE Reflexed from [id number] Performed at [facility], [address] [place], [place] [address]    Culture (  A)  Final    >=100,000 COLONIES/mL ESCHERICHIA COLI 80,000 COLONIES/mL GROUP B STREP(S.AGALACTIAE)ISOLATED TESTING AGAINST S. AGALACTIAE NOT ROUTINELY PERFORMED DUE TO PREDICTABILITY OF AMP/PEN/VAN SUSCEPTIBILITY. Performed at [facility], [address], [place], [place] [address]    Report Status 10/05/2023 FINAL  Final   Organism ID, Bacteria ESCHERICHIA COLI (A)  Final      Susceptibility   Escherichia coli - MIC*    AMPICILLIN <=2 SENSITIVE Sensitive     CEFAZOLIN <=4 SENSITIVE Sensitive     CEFEPIME <=0.12 SENSITIVE Sensitive     CEFTRIAXONE <=0.25 SENSITIVE Sensitive     CIPROFLOXACIN <=0.25 SENSITIVE Sensitive     GENTAMICIN <=1 SENSITIVE Sensitive     IMIPENEM <=0.25 SENSITIVE Sensitive     NITROFURANTOIN <=16 SENSITIVE Sensitive     TRIMETH/SULFA <=20 SENSITIVE Sensitive     AMPICILLIN/SULBACTAM <=2 SENSITIVE Sensitive     PIP/TAZO <=4 SENSITIVE Sensitive ug/mL    * >=100,000 COLONIES/mL ESCHERICHIA COLI     Labs: BNP (last 3 results) No results for input(s): "BNP" in the last 8760 hours. Basic Metabolic Panel: Recent Labs  Lab 10/03/23 0042 10/03/23 0605 10/04/23 0410 10/05/23 0406  NA 127* 135 136 138  K 4.2 3.7 3.7 3.5  CL 95* 104 104 104  CO2 24 23 24 24   GLUCOSE 618* 200* 180* 116*  BUN 42* 32* 24* 19  CREATININE 0.98 0.77 0.79 0.76  CALCIUM 8.7* 8.3* 8.5* 8.8*   Liver Function  Tests: Recent Labs  Lab 10/03/23 0042  AST 15  ALT 15  ALKPHOS 71  BILITOT 0.4  PROT 7.5  ALBUMIN 3.5   No results for input(s): "LIPASE", "AMYLASE" in the last 168 hours. No results for input(s): "AMMONIA" in the last 168 hours. CBC: Recent Labs  Lab 10/03/23 0042 10/03/23 0606  WBC 7.2 7.3  NEUTROABS 4.0  --   HGB 11.6* 10.4*  HCT 35.2* 30.7*  MCV 87.1 84.1  PLT 313 288   Cardiac Enzymes: No results for input(s): "CKTOTAL", "CKMB", "CKMBINDEX", "TROPONINI" in the last 168 hours. BNP: Invalid input(s): "POCBNP" CBG: Recent Labs  Lab 10/04/23 1142 10/04/23 1659 10/04/23 1933 10/05/23 0754 10/05/23 1202  GLUCAP 410* 96 282* 104* 242*   D-Dimer No results for input(s): "DDIMER" in the last 72 hours. Hgb A1c Recent Labs    10/04/23 0410  HGBA1C >15.5*   Lipid Profile No results for input(s): "CHOL", "HDL", "LDLCALC", "TRIG", "CHOLHDL", "LDLDIRECT" in the last 72 hours. Thyroid function studies Recent Labs    10/03/23 0042  TSH 1.704   Anemia work up No results for input(s): "VITAMINB12", "FOLATE", "FERRITIN", "TIBC", "IRON", "RETICCTPCT" in the last 72 hours. Urinalysis    Component Value Date/Time   COLORURINE YELLOW (A) 10/03/2023 0142   APPEARANCEUR CLOUDY (A) 10/03/2023 0142   LABSPEC 1.021 10/03/2023 0142   PHURINE 5.0 10/03/2023 0142   GLUCOSEU >=500 (A) 10/03/2023 0142   HGBUR NEGATIVE 10/03/2023 0142   BILIRUBINUR NEGATIVE 10/03/2023 0142   KETONESUR NEGATIVE 10/03/2023 0142   PROTEINUR NEGATIVE 10/03/2023 0142   UROBILINOGEN 0.2 04/29/2007 0833   NITRITE NEGATIVE 10/03/2023 0142   LEUKOCYTESUR LARGE (A) 10/03/2023 0142   Sepsis Labs Recent Labs  Lab 10/03/23 0042 10/03/23 0606  WBC 7.2 7.3   Microbiology Recent Results (from the past 240 hours)  Resp panel by RT-PCR (RSV, Flu A&B, Covid) Anterior Nasal Swab     Status: None   Collection Time: 10/03/23 12:42 AM   Specimen: Anterior Nasal Swab  Result  Value Ref Range Status    SARS Coronavirus 2 by RT PCR NEGATIVE NEGATIVE Final    Comment: (NOTE) SARS-CoV-2 target nucleic acids are NOT DETECTED.  The SARS-CoV-2 RNA is generally detectable in upper respiratory specimens during the acute phase of infection. The lowest concentration of SARS-CoV-2 viral copies this assay can detect is 138 copies/mL. A negative result does not preclude SARS-Cov-2 infection and should not be used as the sole basis for treatment or other patient management decisions. A negative result may occur with  improper specimen collection/handling, submission of specimen other than nasopharyngeal swab, presence of viral mutation(s) within the areas targeted by this assay, and inadequate number of viral copies(<138 copies/mL). A negative result must be combined with clinical observations, patient history, and epidemiological information. The expected result is Negative.  Fact Sheet for Patients:  BloggerCourse.com  Fact Sheet for Healthcare Providers:  SeriousBroker.it  This test is no t yet approved or cleared by the [place] FDA and  has been authorized for detection and/or diagnosis of SARS-CoV-2 by FDA under an Emergency Use Authorization (EUA). This EUA will remain  in effect (meaning this test can be used) for the duration of the COVID-19 declaration under Section 564(b)(1) of the Act, 21 U.S.C.section 360bbb-3(b)(1), unless the authorization is terminated  or revoked sooner.       Influenza A by PCR NEGATIVE NEGATIVE Final   Influenza B by PCR NEGATIVE NEGATIVE Final    Comment: (NOTE) The Xpert Xpress SARS-CoV-2/FLU/RSV plus assay is intended as an aid in the diagnosis of influenza from Nasopharyngeal swab specimens and should not be used as a sole basis for treatment. Nasal washings and aspirates are unacceptable for Xpert Xpress SARS-CoV-2/FLU/RSV testing.  Fact Sheet for  Patients: BloggerCourse.com  Fact Sheet for Healthcare Providers: SeriousBroker.it  This test is not yet approved or cleared by the [place] FDA and has been authorized for detection and/or diagnosis of SARS-CoV-2 by FDA under an Emergency Use Authorization (EUA). This EUA will remain in effect (meaning this test can be used) for the duration of the COVID-19 declaration under Section 564(b)(1) of the Act, 21 U.S.C. section 360bbb-3(b)(1), unless the authorization is terminated or revoked.     Resp Syncytial Virus by PCR NEGATIVE NEGATIVE Final    Comment: (NOTE) Fact Sheet for Patients: BloggerCourse.com  Fact Sheet for Healthcare Providers: SeriousBroker.it  This test is not yet approved or cleared by the [place] FDA and has been authorized for detection and/or diagnosis of SARS-CoV-2 by FDA under an Emergency Use Authorization (EUA). This EUA will remain in effect (meaning this test can be used) for the duration of the COVID-19 declaration under Section 564(b)(1) of the Act, 21 U.S.C. section 360bbb-3(b)(1), unless the authorization is terminated or revoked.  Performed at [facility], [address] [place], [place] [address]   Blood Culture (routine x 2)     Status: None (Preliminary result)   Collection Time: 10/03/23 12:43 AM   Specimen: BLOOD  Result Value Ref Range Status   Specimen Description BLOOD RIGHT FA  Final   Special Requests   Final    BOTTLES DRAWN AEROBIC AND ANAEROBIC Blood Culture results may not be optimal due to an inadequate volume of blood received in culture bottles   Culture   Final    NO GROWTH 2 DAYS Performed at [facility], [address] [place], [place] [address]    Report Status PENDING  Incomplete  Urine Culture     Status:  Abnormal   Collection Time: 10/03/23  1:42 AM   Specimen: Urine, Random  Result  Value Ref Range Status   Specimen Description   Final    URINE, RANDOM Performed at [facility], [address] [place], [place] [address]    Special Requests   Final    NONE Reflexed from [id number] Performed at [facility], [address] [place], [place] [address]    Culture (A)  Final    >=100,000 COLONIES/mL ESCHERICHIA COLI 80,000 COLONIES/mL GROUP B STREP(S.AGALACTIAE)ISOLATED TESTING AGAINST S. AGALACTIAE NOT ROUTINELY PERFORMED DUE TO PREDICTABILITY OF AMP/PEN/VAN SUSCEPTIBILITY. Performed at [facility], [address] [place], [place] [address]    Report Status 10/05/2023 FINAL  Final   Organism ID, Bacteria ESCHERICHIA COLI (A)  Final      Susceptibility   Escherichia coli - MIC*    AMPICILLIN <=2 SENSITIVE Sensitive     CEFAZOLIN <=4 SENSITIVE Sensitive     CEFEPIME <=0.12 SENSITIVE Sensitive     CEFTRIAXONE <=0.25 SENSITIVE Sensitive     CIPROFLOXACIN <=0.25 SENSITIVE Sensitive     GENTAMICIN <=1 SENSITIVE Sensitive     IMIPENEM <=0.25 SENSITIVE Sensitive     NITROFURANTOIN <=16 SENSITIVE Sensitive     TRIMETH/SULFA <=20 SENSITIVE Sensitive     AMPICILLIN/SULBACTAM <=2 SENSITIVE Sensitive     PIP/TAZO <=4 SENSITIVE Sensitive ug/mL    * >=100,000 COLONIES/mL ESCHERICHIA COLI     Time coordinating discharge: Over 30 minutes  SIGNED:   Tresa Moore, MD  [facility] 10/05/2023, 4:28 PM Pager   If 7PM-7AM, please contact night-coverage

## 2023-10-07 ENCOUNTER — Encounter: Payer: Self-pay | Admitting: Endocrinology

## 2023-10-07 ENCOUNTER — Ambulatory Visit (INDEPENDENT_AMBULATORY_CARE_PROVIDER_SITE_OTHER): Admitting: Endocrinology

## 2023-10-07 VITALS — BP 104/80 | HR 90 | Resp 20 | Ht 63.0 in | Wt 128.0 lb

## 2023-10-07 DIAGNOSIS — E1165 Type 2 diabetes mellitus with hyperglycemia: Secondary | ICD-10-CM | POA: Diagnosis not present

## 2023-10-07 DIAGNOSIS — Z794 Long term (current) use of insulin: Secondary | ICD-10-CM

## 2023-10-07 DIAGNOSIS — R4189 Other symptoms and signs involving cognitive functions and awareness: Secondary | ICD-10-CM

## 2023-10-07 DIAGNOSIS — Z7985 Long-term (current) use of injectable non-insulin antidiabetic drugs: Secondary | ICD-10-CM

## 2023-10-07 DIAGNOSIS — E1122 Type 2 diabetes mellitus with diabetic chronic kidney disease: Secondary | ICD-10-CM | POA: Diagnosis not present

## 2023-10-07 DIAGNOSIS — N1831 Chronic kidney disease, stage 3a: Secondary | ICD-10-CM

## 2023-10-07 DIAGNOSIS — R410 Disorientation, unspecified: Secondary | ICD-10-CM | POA: Diagnosis not present

## 2023-10-07 DIAGNOSIS — Z7984 Long term (current) use of oral hypoglycemic drugs: Secondary | ICD-10-CM

## 2023-10-07 MED ORDER — PIOGLITAZONE HCL 15 MG PO TABS
15.0000 mg | ORAL_TABLET | Freq: Every day | ORAL | 3 refills | Status: AC
Start: 2023-10-07 — End: 2023-11-06

## 2023-10-07 MED ORDER — GLIMEPIRIDE 1 MG PO TABS: 1.0000 mg | ORAL_TABLET | Freq: Every day | ORAL | 3 refills | Status: AC

## 2023-10-07 MED ORDER — TRULICITY 1.5 MG/0.5ML ~~LOC~~ SOAJ: 1.5000 mg | SUBCUTANEOUS | 0 refills | Status: AC

## 2023-10-07 MED ORDER — INSULIN GLARGINE 100 UNIT/ML SOLOSTAR PEN: 10.0000 [IU] | PEN_INJECTOR | Freq: Every day | SUBCUTANEOUS | 3 refills | Status: AC

## 2023-10-07 NOTE — Progress Notes (Signed)
 Outpatient Endocrinology Note [place] Anvith Mauriello, MD  10/07/23  Patient's Name: Alicia Medina    DOB: Feb 12, 1933    MRN: [id number]                                                    REASON OF VISIT: Follow up for type 2 diabetes mellitus / postsurgical hypothyroidism  PCP: Pcp, No  HISTORY OF PRESENT ILLNESS:   Alicia Medina is a 89 y.o. old female with past medical history listed below, is here for evaluation / follow up of type 2 diabetes mellitus /postsurgical hypothyroidism.    Pertinent Diabetes History: Patient was previously seen by Dr. Everardo All and was last time seen in March 2023.  Patient was diagnosed with type 2 diabetes mellitus in 2008. Insulin therapy started in 2013, not fully compliant, H stopped.  Patient believes and she thinks he does not have diabetes mellitus.  None for family members blood related has diabetes mellitus, which makes her hard to believe that she has type 2 diabetes mellitus.  She has uncontrolled type 2 diabetes mellitus.  Chronic Diabetes Complications : Retinopathy: no. Last ophthalmology exam was done on annually, reportedly. Nephropathy: CKD IIIa, on losartan Peripheral neuropathy: yes, on gabapentin , following with ? podiatry.  Coronary artery disease: CAD Stroke: no  Relevant comorbidities and cardiovascular risk factors: Obesity: no Body mass index is 22.67 kg/m.  Hypertension: yes Hyperlipidemia. Yes, on statin.   Current / Home Diabetic regimen includes: Trulicity 3 mg weekly.  Not taking for several months.  Prior diabetic medications: Metformin diarrhea.  Ozempic diarrhea. She had been on insulin therapy in the past Lantus, Levemir, NPH.  She declined multiple daily injections in the past.  Glycemic data:   Patient's blood sugar log reviewed in last 3 days blood sugar are as follows, at different times of the day, the morning and fasting : 293, 346, 336, 261.   Hypoglycemia: Patient has no hypoglycemic episodes. Patient  has hypoglycemia awareness.  Factors modifying glucose control: 1.  Diabetic diet assessment: 3 meals a day, sometimes candy, frequently snacks.  2.  Staying active or exercising: no formal exercise.   3.  Medication compliance: compliant some of the time.  # Postsurgical hypothyroidism : Patient had multinodular goiter status post total thyroidectomy in October 2008, pathology benign, no evidence of malignancy.  She has been on Synthroid since then, compliance had been issue in the past.  Interval history Patient is accompanied by 2 sons and daughter in the clinic today.  Patient was hospitalized recently due to hyperglycemia with blood sugar more than 600, had confusion, was also diagnosed with urinary tract infection recently completed antibiotic treatment.  Patient was not compliant with Trulicity, has not taken for several months and also not compliant with the diet probably eating frequent snacks.  Patient was discharged on basal insulin Lantus 10 units, pioglitazone.  Blood sugar has been in the range of 200-300.  Children are concerned that there has been drastic change in her behavior and medication compliance in the last few months.  There has been a compliance issue with taking medication in the past. Patient's children lives out of town and patient lives by herself at home.  She has concern of cognitive decline and memory issues.  Patient's children's are trying to figure out and making a plan  for her to have help at home, possibly moved to [place] closer to the family or assisted living facility.  Patient generally does not take medications, not fully compliant.  Patient reports in last several months she has been going to different places /different states and not able to take the medication properly.  We discussed in detail about importance of controlling blood sugar, significantly high blood sugar the way she had on recent hospitalization more than 600 can be life-threatening and  detrimental for health.  Discussed about importance of taking diabetic medication.  Patient has limited interaction and communication with me in the clinic today however she was listening and reinforced with her children in the clinic.  Patient is taking levothyroxine 175 mcg daily.  She thinks she is probably taking regularly, after one of her sons reinforced to take the medication regularly about a month ago.  Recent thyroid function test was acceptable as follows.  Recent hemoglobin A1c was undetectably high > 15.5%.  Labs:   Latest Reference Range & Units 10/03/23 00:42  TSH 0.350 - 4.500 uIU/mL 1.704  T4,Free(Direct) 0.61 - 1.12 ng/dL 9.14 (H)  (H): Data is abnormally high   Latest Reference Range & Units 10/04/23 04:10  Hemoglobin A1C 4.8 - 5.6 % >15.5 (H)  (H): Data is abnormally high  REVIEW OF SYSTEMS As per history of present illness.   PAST MEDICAL HISTORY: Past Medical History:  Diagnosis Date   Arthritis    CAD (coronary artery disease) CARDIOLOGIST-  DR [place]   08-28-2011 Inferior STEMI with BMS to the RCA, complicated by cardiogenic shock, VF, VDRF , temporary CHB and abrupt reocclusion of the RCA stent treated with a second BMS to the RCA. Has residual LAD disease. EF is 50%   Diverticulosis of colon    Hemorrhoids, internal, with bleeding    Hyperlipidemia    Hypertension    Hypothyroidism, postsurgical    S/p bare metal coronary artery stent    2013   Type 2 diabetes mellitus (HCC)    Wears hearing aid    both ears    PAST SURGICAL HISTORY: Past Surgical History:  Procedure Laterality Date   CARDIOVASCULAR STRESS TEST  10-03-2011  dr [place]   normal perfusion / no ischemia/ ef 77%   CARPAL TUNNEL RELEASE Right 01/25/2013   Procedure: CARPAL TUNNEL RELEASE;  Surgeon: Nicki Reaper, MD;  Location: [facility];  Service: Orthopedics;  Laterality: Right;   CARPAL TUNNEL RELEASE Left 10/25/2013   Procedure: LEFT CARPAL TUNNEL RELEASE;  Surgeon:  Nicki Reaper, MD;  Location: [facility];  Service: Orthopedics;  Laterality: Left;   CATARACT EXTRACTION W/ INTRAOCULAR LENS  IMPLANT, BILATERAL  2007   COLONOSCOPY  04-27-2003   CORONARY ANGIOGRAM Left 08/28/2011   Procedure: CORONARY ANGIOGRAM;  Surgeon: Peter M [place], MD;  Location: [facility] CATH LAB;  Service: Cardiovascular;  Laterality: Left;   CORONARY ANGIOPLASTY WITH STENT PLACEMENT  08-28-2011  dr [place]   BMS to [facility] with abrupt reocclusion treated with second BMS/  LAD ostial 90%, proximal50-60%, first diagonal 80-90%   CORONARY STENT INTERVENTION N/A 10/11/2018   Procedure: CORONARY STENT INTERVENTION;  Surgeon: [place], Peter M, MD;  Location: [facility] INVASIVE CV LAB;  Service: Cardiovascular;  Laterality: N/A;   LEFT HEART CATH AND CORONARY ANGIOGRAPHY N/A 10/11/2018   Procedure: LEFT HEART CATH AND CORONARY ANGIOGRAPHY;  Surgeon: [place], Peter M, MD;  Location: [facility] INVASIVE CV LAB;  Service: Cardiovascular;  Laterality: N/A;   LEFT  HEART CATHETERIZATION WITH CORONARY ANGIOGRAM N/A 08/28/2011   Procedure: LEFT HEART CATHETERIZATION WITH CORONARY ANGIOGRAM;  Surgeon: Peter M [place], MD;  Location: [facility] CATH LAB;  Service: Cardiovascular;  Laterality: N/A;   MASS EXCISION Right 08/06/2020   Procedure: EXCISION MASS AND DEBRIDEMENT PROXIMAL INTERPHALANGEAL JOINT RIGHT SMALL FINGER;  Surgeon: Cindee Salt, MD;  Location: [facility];  Service: Orthopedics;  Laterality: Right;  Bier block   PERCUTANEOUS CORONARY STENT INTERVENTION (PCI-S) Left 08/28/2011   Procedure: PERCUTANEOUS CORONARY STENT INTERVENTION (PCI-S);  Surgeon: Peter M [place], MD;  Location: [facility] CATH LAB;  Service: Cardiovascular;  Laterality: Left;   TOTAL THYROIDECTOMY Bilateral 05-02-2007   multinodular goiter   TRANSANAL HEMORRHOIDAL DEARTERIALIZATION N/A 03/28/2014   Procedure: TRANSANAL HEMORRHOIDAL DEARTERIALIZATION;  Surgeon: Romie Levee, MD;  Location: [facility];  Service: General;   Laterality: N/A;   TRANSTHORACIC ECHOCARDIOGRAM  08-30-2011   mild LVH/ ef 50-55%/  grade I diastolic dysfunction   TRIGGER FINGER RELEASE Right 01/25/2013   Procedure: RELEASE TRIGGER FINGER/A-1 PULLEY RIGHT INDEX FINGER;  Surgeon: Nicki Reaper, MD;  Location: [facility];  Service: Orthopedics;  Laterality: Right;   TRIGGER FINGER RELEASE Left 10/25/2013   Procedure: RELEASE A-1 PULLEY LEFT MIDDLE FINGER;  Surgeon: Nicki Reaper, MD;  Location: [facility];  Service: Orthopedics;  Laterality: Left;   TRIGGER FINGER RELEASE Left 12/04/2014   Procedure: RELEASE TRIGGER FINGER/A-1 PULLEY LEFT RING FINGER;  Surgeon: Cindee Salt, MD;  Location: [facility];  Service: Orthopedics;  Laterality: Left;   TRIGGER FINGER RELEASE Right 03/30/2017   Procedure: RELEASE TRIGGER FINGER/A-1 PULLEY RIGHT RING FINGER AND RIGHT SMALL FINGER;  Surgeon: Cindee Salt, MD;  Location: [facility];  Service: Orthopedics;  Laterality: Right;    ALLERGIES: Allergies  Allergen Reactions   Demeclocycline Other (See Comments)    Severe stomach cramps Severe stomach cramps Severe stomach cramps   Lisinopril Cough   Tetracyclines & Related Other (See Comments)    Severe stomach cramps    FAMILY HISTORY:  Family History  Problem Relation Age of Onset   Stroke Mother    Cancer Mother        breast age 34's   Cancer Father        lung   Stroke Father    Diabetes Neg Hx     SOCIAL HISTORY: Social History   Socioeconomic History   Marital status: Married    Spouse name: Not on file   Number of children: Not on file   Years of education: Not on file   Highest education level: Not on file  Occupational History   Not on file  Tobacco Use   Smoking status: Never   Smokeless tobacco: Never  Vaping Use   Vaping status: Never Used  Substance and Sexual Activity   Alcohol use: Yes    Alcohol/week: 1.0 standard drink of alcohol    Types: 1 Glasses of wine per  week    Comment: social   Drug use: No   Sexual activity: Not on file  Other Topics Concern   Not on file  Social History Narrative   Lives with husband.   Has 2 sons- both healthy.   Younger son lives in [place], [place] in order son lives in [place].   Social Drivers of Corporate investment banker Strain: Not on file  Food Insecurity: No Food Insecurity (10/03/2023)   Hunger Vital Sign    Worried About  Running Out of Food in the Last Year: Never true    Ran Out of Food in the Last Year: Never true  Transportation Needs: No Transportation Needs (10/03/2023)   PRAPARE - Administrator, Civil Service (Medical): No    Lack of Transportation (Non-Medical): No  Physical Activity: Not on file  Stress: Not on file  Social Connections: Unknown (10/03/2023)   Social Connection and Isolation Panel [NHANES]    Frequency of Communication with Friends and Family: More than three times a week    Frequency of Social Gatherings with Friends and Family: More than three times a week    Attends Religious Services: More than 4 times per year    Active Member of Clubs or Organizations: Patient unable to answer    Attends Banker Meetings: Patient unable to answer    Marital Status: Widowed    MEDICATIONS:  Current Outpatient Medications  Medication Sig Dispense Refill   Accu-Chek Softclix Lancets lancets Use to check blood sugar morning, at noon, and at bedtime. 100 each 0   acetaminophen (TYLENOL) 500 MG tablet Take 2 tablets (1,000 mg total) by mouth every 6 (six) hours as needed for mild pain (pain score 1-3), fever or headache.     aspirin EC 81 MG tablet Take 81 mg by mouth daily.     Blood Glucose Monitoring Suppl (BLOOD GLUCOSE MONITOR SYSTEM) w/Device KIT Use to check blood sugar in the morning, at noon, and at bedtime. 1 kit 0   Cholecalciferol (VITAMIN D-3) 125 MCG (5000 UT) TABS Take 5,000 Units by mouth daily.     clopidogrel (PLAVIX) 75 MG tablet Take 1 tablet (75 mg  total) by mouth daily. 30 tablet 0   Dulaglutide (TRULICITY) 1.5 MG/0.5ML SOAJ Inject 1.5 mg into the skin once a week. 2 mL 0   gabapentin (NEURONTIN) 100 MG capsule Take 1 capsule (100 mg total) by mouth 2 (two) times daily. 60 capsule 0   glimepiride (AMARYL) 1 MG tablet Take 1 tablet (1 mg total) by mouth daily with breakfast. 90 tablet 3   Glucose Blood (BLOOD GLUCOSE TEST STRIPS) STRP Check blood sugar in the morning, at noon, and at bedtime. 100 strip 0   glucose blood (TRUE METRIX BLOOD GLUCOSE TEST) test strip 1 each by Other route 2 (two) times daily. And lancets 2/day 200 each 3   Insulin Pen Needle 32G X 4 MM MISC Use 3 (three) times daily. 100 each 0   Lancet Device MISC 1 each by Does not apply route in the morning, at noon, and at bedtime. May substitute to any manufacturer covered by patient's insurance. 1 each 0   levothyroxine (SYNTHROID) 175 MCG tablet Take 1 tablet (175 mcg total) by mouth daily before breakfast. 30 tablet 0   losartan (COZAAR) 25 MG tablet Take 1 tablet (25 mg total) by mouth every morning. 30 tablet 0   metoprolol tartrate (LOPRESSOR) 50 MG tablet Take 1 tablet (50 mg total) by mouth 2 (two) times daily. 30 tablet 0   rosuvastatin (CRESTOR) 20 MG tablet Take 1 tablet (20 mg total) by mouth daily. 30 tablet 0   cephALEXin (KEFLEX) 500 MG capsule Take 1 capsule (500 mg total) by mouth every 12 (twelve) hours for 2 days. (Patient not taking: Reported on 10/07/2023) 4 capsule 0   insulin glargine (LANTUS) 100 UNIT/ML Solostar Pen Inject 10 Units into the skin daily. 15 mL 3   loperamide (IMODIUM A-D) 2 MG tablet Take  1-2 mg by mouth as directed. (Patient not taking: Reported on 10/07/2023)     loratadine (CLARITIN) 10 MG tablet Take 10 mg by mouth at bedtime. (Patient not taking: Reported on 10/07/2023)     pioglitazone (ACTOS) 15 MG tablet Take 1 tablet (15 mg total) by mouth daily. 90 tablet 3   Vitamin E 500 UNIT/GM POWD Take by mouth. (Patient not taking: Reported  on 10/07/2023)     No current facility-administered medications for this visit.    PHYSICAL EXAM: Vitals:   10/07/23 0955  BP: 104/80  Pulse: 90  Resp: 20  SpO2: 97%  Weight: 128 lb (58.1 kg)  Height: 5\' 3"  (1.6 m)    Body mass index is 22.67 kg/m.  Wt Readings from Last 3 Encounters:  10/07/23 128 lb (58.1 kg)  10/03/23 141 lb 3.2 oz (64 kg)  05/03/23 141 lb 9.6 oz (64.2 kg)    General: Well developed, well nourished female in no apparent distress.  HEENT: AT/, no external lesions.  Eyes: Conjunctiva clear and no icterus. Neck: Neck supple. Anterior neck scar + s/p thyroidectomy.  Lungs: Respirations not labored Neurologic: Alert, oriented, normal speech Extremities / Skin: Dry. No pedal edema.  Psychiatric: Does not appear depressed or anxious  Diabetic Foot Exam - Simple   No data filed    LABS Reviewed Lab Results  Component Value Date   HGBA1C >15.5 (H) 10/04/2023   HGBA1C 9.4 (A) 05/03/2023   HGBA1C 10.6 (H) 11/05/2022   No results found for: "FRUCTOSAMINE" Lab Results  Component Value Date   CHOL 171 11/05/2022   HDL 63 11/05/2022   LDLCALC 51 11/05/2022   TRIG 381 (H) 11/05/2022   CHOLHDL 2.7 11/05/2022   Lab Results  Component Value Date   MICRALBCREAT 15.0 05/03/2023   MICRALBCREAT 30-300 11/29/2017   Lab Results  Component Value Date   CREATININE 0.76 10/05/2023   Lab Results  Component Value Date   GFR 49.25 (L) 07/02/2021    ASSESSMENT / PLAN  1. Type 2 diabetes mellitus with stage 3a chronic kidney disease, without long-term current use of insulin (HCC)   2. Confusion   3. Cognitive change   4. Uncontrolled type 2 diabetes mellitus with hyperglycemia (HCC)    Diabetes Mellitus type 2, complicated by diabetic neuropathy/CKD. - Diabetic status / severity: Poorly uncontrolled.  Lab Results  Component Value Date   HGBA1C >15.5 (H) 10/04/2023    - Hemoglobin A1c goal : <8%  Patient has significant worsening of diabetes  control.  She was recently hospitalized due to hyperglycemia confusion and urinary tract infection.  Patient has not been compliant with Trulicity for several months.  Generally she is not compliant with the medication and lately also not compliant with the diet.  She seems to has cognitive decline and memory issues.  She lives by herself.  Patient's children are planning and coordinating for her to have help at home or possibly move to the [place] closer to family or possibly assisted living facility.  We discussed and I recommended to the patient and family she needs help for taking care of her health including medication compliance, consider home health or facility or with family.  Patient needs multiple medication for the diabetes control to have blood sugar in the acceptable range including insulin therapy.  Ideally she would need mealtime insulin however patient does not like to take it and not feasible in the overall patient's social context.  Will treat with relatively low dose of  basal insulin, and other diabetic medications as follows.  Permissible hyperglycemia with a blood sugar in the range of 150-250 is acceptable.  - Medications: See below.  Diabetes regimen:  Continue lantus 10 untis daily.  Recently started after hospitalization.   Continue Actos 15 mg daily. Start glimepiride 1 mg daily in the morning with breakfast. Restart Trulicity 1.5 mg weekly and increase to 3 mg weekly after a month.  Eat scheduled meals with breakfast, lunch and supper.  Check blood sugar before meals and at bedtime.  - Home glucose testing: Check in the morning before breakfast and bedtime.  Patient has test supplies with glucometer. - Discussed/ Gave Hypoglycemia treatment plan.  # Consult : not required at this time.   # Annual urine for microalbuminuria/ creatinine ratio, no microalbuminuria currently, continue ACE/ARB /losartan. Last  Lab Results  Component Value Date   MICRALBCREAT 15.0  05/03/2023    # Foot check nightly / neuropathy, continue gabapentin.  Follows up with podiatry.  # Annual dilated diabetic eye exams.   - Diet: Make healthy diabetic food choices/asked to avoid candies. - Life style / activity / exercise: Discussed.  2. Blood pressure  -  BP Readings from Last 1 Encounters:  10/07/23 104/80    - Control is in target.  - No change in current plans.  3. Lipid status / Hyperlipidemia - Last  Lab Results  Component Value Date   LDLCALC 51 11/05/2022   - Continue rosuvastatin 20 mg daily.  # Postsurgical hypothyroidism status post total thyroidectomy in 2008. -Currently taking levothyroxine 175 mcg daily.  Compliance had been issue in the past. -Advised to take levothyroxine the morning before breakfast in empty stomach.  If she forgets okay to take double dose next day. -Continue current dose for now.  Will check lab in the follow-up visit.  # Confusion/cognitive decline : -Will refer to neurology.  # Patient's children may move patient to the [place].  In that case recommended to find a local endocrinologist preferably or medical provider.  In the transition,  Encouraged to call our clinic with any questions and medication refills.  Medical records can be sent when requested.   Diagnoses and all orders for this visit:  Type 2 diabetes mellitus with stage 3a chronic kidney disease, without long-term current use of insulin (HCC) -     Dulaglutide (TRULICITY) 1.5 MG/0.5ML SOAJ; Inject 1.5 mg into the skin once a week. -     glimepiride (AMARYL) 1 MG tablet; Take 1 tablet (1 mg total) by mouth daily with breakfast. -     insulin glargine (LANTUS) 100 UNIT/ML Solostar Pen; Inject 10 Units into the skin daily.  Confusion -     Ambulatory referral to Neurology  Cognitive change -     Ambulatory referral to Neurology  Uncontrolled type 2 diabetes mellitus with hyperglycemia (HCC)  Other orders -     pioglitazone (ACTOS) 15 MG tablet; Take 1  tablet (15 mg total) by mouth daily.    DISPOSITION Follow up in clinic in 4 weeks suggested.   All questions answered and patient verbalized understanding of the plan.  [place] Daphna Lafuente, MD [facility] Endocrinology [facility] [address] [place], [address] [place], [place] [address] Phone # [phone]  At least part of this note was generated using voice recognition software. Inadvertent word errors may have occurred, which were not recognized during the proofreading process.

## 2023-10-07 NOTE — Patient Instructions (Signed)
 Diabetes regimen:  Continue lantus 10 untis daily  Continue Actos 15 mg daily. Start glimepiride 1 mg daily in the morning with breakfast. Restart Trulicity 1.5 mg weekly and increase to 3 mg weekly after a month.  Eat scheduled meals with breakfast, lunch and supper.  Check blood sugar before meals and at bedtime.

## 2023-10-08 LAB — CULTURE, BLOOD (ROUTINE X 2): Culture: NO GROWTH

## 2023-10-15 ENCOUNTER — Telehealth: Payer: Self-pay | Admitting: Dietician

## 2023-10-15 DIAGNOSIS — R4189 Other symptoms and signs involving cognitive functions and awareness: Secondary | ICD-10-CM

## 2023-10-15 NOTE — Telephone Encounter (Signed)
 Patient's son called. He states that his mother was referred to a neurologist.  She is currently in [place] receiving care from her son.   They would like the neurology referral faxed to [phone]  attn:  Deena at [facility].  Oran Rein, RD, LDN, CDCES, DipACLM

## 2023-10-18 NOTE — Telephone Encounter (Signed)
 I have sent an external neurology referral, this fax the referral to the following :  Fax to [phone] attn: Deena at [facility].   Alicia Niyah Mamaril, MD [facility] Endocrinology [facility] [address] [place], [address] [place], [place] [address] Phone # [phone]

## 2023-11-04 ENCOUNTER — Other Ambulatory Visit (HOSPITAL_COMMUNITY): Payer: Self-pay

## 2023-11-04 ENCOUNTER — Ambulatory Visit: Admitting: Endocrinology

## 2023-11-05 ENCOUNTER — Telehealth: Payer: Self-pay

## 2023-11-05 NOTE — Telephone Encounter (Signed)
 Patient called and Mychart message sent to patient with instructions: Per MD no action needed

## 2023-11-08 NOTE — Telephone Encounter (Signed)
 complete
